# Patient Record
Sex: Female | Born: 1955 | Race: White | Hispanic: No | Marital: Married | Smoking: Current every day smoker
Health system: Southern US, Community
[De-identification: ages and names within clinical notes are randomized; demographics above are authoritative.]

## PROBLEM LIST (undated history)

## (undated) DIAGNOSIS — J449 Chronic obstructive pulmonary disease, unspecified: Secondary | ICD-10-CM

## (undated) DIAGNOSIS — G8929 Other chronic pain: Secondary | ICD-10-CM

## (undated) DIAGNOSIS — Z950 Presence of cardiac pacemaker: Secondary | ICD-10-CM

## (undated) DIAGNOSIS — Z87891 Personal history of nicotine dependence: Secondary | ICD-10-CM

## (undated) DIAGNOSIS — I441 Atrioventricular block, second degree: Secondary | ICD-10-CM

## (undated) DIAGNOSIS — Z720 Tobacco use: Secondary | ICD-10-CM

## (undated) DIAGNOSIS — IMO0002 Reserved for concepts with insufficient information to code with codable children: Secondary | ICD-10-CM

## (undated) DIAGNOSIS — G40209 Localization-related (focal) (partial) symptomatic epilepsy and epileptic syndromes with complex partial seizures, not intractable, without status epilepticus: Secondary | ICD-10-CM

## (undated) DIAGNOSIS — S069XAA Unspecified intracranial injury with loss of consciousness status unknown, initial encounter: Secondary | ICD-10-CM

## (undated) DIAGNOSIS — F319 Bipolar disorder, unspecified: Secondary | ICD-10-CM

## (undated) DIAGNOSIS — S069X9A Unspecified intracranial injury with loss of consciousness of unspecified duration, initial encounter: Secondary | ICD-10-CM

## (undated) DIAGNOSIS — I10 Essential (primary) hypertension: Secondary | ICD-10-CM

## (undated) DIAGNOSIS — R569 Unspecified convulsions: Secondary | ICD-10-CM

## (undated) DIAGNOSIS — T7491XA Unspecified adult maltreatment, confirmed, initial encounter: Secondary | ICD-10-CM

## (undated) DIAGNOSIS — B182 Chronic viral hepatitis C: Secondary | ICD-10-CM

## (undated) DIAGNOSIS — R300 Dysuria: Secondary | ICD-10-CM

## (undated) DIAGNOSIS — M25559 Pain in unspecified hip: Secondary | ICD-10-CM

## (undated) HISTORY — DX: Presence of cardiac pacemaker: Z95.0

## (undated) HISTORY — DX: Localization-related (focal) (partial) symptomatic epilepsy and epileptic syndromes with complex partial seizures, not intractable, without status epilepticus: G40.209

## (undated) HISTORY — DX: Chronic obstructive pulmonary disease, unspecified: J44.9

## (undated) HISTORY — DX: Tobacco use: Z72.0

## (undated) HISTORY — DX: Reserved for concepts with insufficient information to code with codable children: IMO0002

## (undated) HISTORY — DX: Unspecified adult maltreatment, confirmed, initial encounter: T74.91XA

## (undated) HISTORY — DX: Personal history of nicotine dependence: Z87.891

## (undated) HISTORY — DX: Unspecified intracranial injury with loss of consciousness status unknown, initial encounter: S06.9XAA

## (undated) HISTORY — PX: TUBAL LIGATION: SHX77

## (undated) HISTORY — DX: Pain in unspecified hip: M25.559

## (undated) HISTORY — DX: Unspecified intracranial injury with loss of consciousness of unspecified duration, initial encounter: S06.9X9A

## (undated) HISTORY — DX: Bipolar disorder, unspecified: F31.9

## (undated) HISTORY — DX: Dysuria: R30.0

## (undated) HISTORY — DX: Chronic viral hepatitis C: B18.2

## (undated) HISTORY — DX: Atrioventricular block, second degree: I44.1

## (undated) HISTORY — DX: Unspecified convulsions: R56.9

## (undated) HISTORY — DX: Essential (primary) hypertension: I10

## (undated) HISTORY — DX: Other chronic pain: G89.29

---

## 2009-07-27 HISTORY — PX: PACEMAKER PLACEMENT: SHX43

## 2011-04-02 DIAGNOSIS — F319 Bipolar disorder, unspecified: Secondary | ICD-10-CM | POA: Insufficient documentation

## 2011-04-02 DIAGNOSIS — I10 Essential (primary) hypertension: Secondary | ICD-10-CM | POA: Insufficient documentation

## 2011-05-28 DIAGNOSIS — IMO0002 Reserved for concepts with insufficient information to code with codable children: Secondary | ICD-10-CM

## 2011-05-28 HISTORY — DX: Reserved for concepts with insufficient information to code with codable children: IMO0002

## 2011-08-26 DIAGNOSIS — I441 Atrioventricular block, second degree: Secondary | ICD-10-CM | POA: Insufficient documentation

## 2011-08-29 DIAGNOSIS — IMO0002 Reserved for concepts with insufficient information to code with codable children: Secondary | ICD-10-CM | POA: Insufficient documentation

## 2011-11-18 DIAGNOSIS — G40209 Localization-related (focal) (partial) symptomatic epilepsy and epileptic syndromes with complex partial seizures, not intractable, without status epilepticus: Secondary | ICD-10-CM | POA: Insufficient documentation

## 2012-05-04 DIAGNOSIS — Z8619 Personal history of other infectious and parasitic diseases: Secondary | ICD-10-CM | POA: Insufficient documentation

## 2012-07-16 DIAGNOSIS — Z950 Presence of cardiac pacemaker: Secondary | ICD-10-CM

## 2012-07-16 HISTORY — DX: Presence of cardiac pacemaker: Z95.0

## 2013-07-31 HISTORY — PX: COLONOSCOPY: SHX174

## 2014-05-23 DIAGNOSIS — T7491XA Unspecified adult maltreatment, confirmed, initial encounter: Secondary | ICD-10-CM | POA: Insufficient documentation

## 2014-05-23 HISTORY — DX: Unspecified adult maltreatment, confirmed, initial encounter: T74.91XA

## 2014-06-24 DIAGNOSIS — R300 Dysuria: Secondary | ICD-10-CM | POA: Insufficient documentation

## 2015-09-01 ENCOUNTER — Ambulatory Visit: Payer: Self-pay | Admitting: Family Medicine

## 2015-09-17 ENCOUNTER — Encounter: Payer: Self-pay | Admitting: Family Medicine

## 2015-09-17 ENCOUNTER — Ambulatory Visit (INDEPENDENT_AMBULATORY_CARE_PROVIDER_SITE_OTHER): Payer: Medicaid Other | Admitting: Family Medicine

## 2015-09-17 VITALS — BP 167/75 | HR 90 | Temp 98.8°F | Ht 65.5 in | Wt 130.0 lb

## 2015-09-17 DIAGNOSIS — W1800XA Striking against unspecified object with subsequent fall, initial encounter: Secondary | ICD-10-CM | POA: Insufficient documentation

## 2015-09-17 DIAGNOSIS — R569 Unspecified convulsions: Secondary | ICD-10-CM

## 2015-09-17 DIAGNOSIS — J449 Chronic obstructive pulmonary disease, unspecified: Secondary | ICD-10-CM

## 2015-09-17 DIAGNOSIS — Z720 Tobacco use: Secondary | ICD-10-CM

## 2015-09-17 DIAGNOSIS — R269 Unspecified abnormalities of gait and mobility: Secondary | ICD-10-CM | POA: Diagnosis not present

## 2015-09-17 DIAGNOSIS — Z23 Encounter for immunization: Secondary | ICD-10-CM | POA: Diagnosis not present

## 2015-09-17 DIAGNOSIS — K739 Chronic hepatitis, unspecified: Secondary | ICD-10-CM

## 2015-09-17 DIAGNOSIS — Z87891 Personal history of nicotine dependence: Secondary | ICD-10-CM | POA: Insufficient documentation

## 2015-09-17 DIAGNOSIS — Z950 Presence of cardiac pacemaker: Secondary | ICD-10-CM

## 2015-09-17 DIAGNOSIS — G629 Polyneuropathy, unspecified: Secondary | ICD-10-CM | POA: Diagnosis not present

## 2015-09-17 DIAGNOSIS — W1809XS Striking against other object with subsequent fall, sequela: Secondary | ICD-10-CM

## 2015-09-17 DIAGNOSIS — I441 Atrioventricular block, second degree: Secondary | ICD-10-CM | POA: Diagnosis not present

## 2015-09-17 DIAGNOSIS — S069X9A Unspecified intracranial injury with loss of consciousness of unspecified duration, initial encounter: Secondary | ICD-10-CM | POA: Insufficient documentation

## 2015-09-17 DIAGNOSIS — S069X5S Unspecified intracranial injury with loss of consciousness greater than 24 hours with return to pre-existing conscious level, sequela: Secondary | ICD-10-CM

## 2015-09-17 DIAGNOSIS — F3170 Bipolar disorder, currently in remission, most recent episode unspecified: Secondary | ICD-10-CM

## 2015-09-17 DIAGNOSIS — R635 Abnormal weight gain: Secondary | ICD-10-CM

## 2015-09-17 DIAGNOSIS — S069XAA Unspecified intracranial injury with loss of consciousness status unknown, initial encounter: Secondary | ICD-10-CM | POA: Insufficient documentation

## 2015-09-17 DIAGNOSIS — I10 Essential (primary) hypertension: Secondary | ICD-10-CM

## 2015-09-17 DIAGNOSIS — Z87820 Personal history of traumatic brain injury: Secondary | ICD-10-CM | POA: Insufficient documentation

## 2015-09-17 DIAGNOSIS — Z5181 Encounter for therapeutic drug level monitoring: Secondary | ICD-10-CM

## 2015-09-17 HISTORY — DX: Personal history of nicotine dependence: Z87.891

## 2015-09-17 MED ORDER — AMLODIPINE BESYLATE 2.5 MG PO TABS: 2.5000 mg | ORAL_TABLET | Freq: Every day | ORAL | Status: DC
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
## 2015-09-17 NOTE — Patient Instructions (Addendum)
We'll talk about maybing starting the Chantix AFTER you see the psychiatrist and cardiologist Pick a quit date about a week after you start Check your old records or bottles or call your pharmacy and let me know the name and dose of the medicine We'll refer you to several specialist Start the 2.5 mg amlodipine once a day Your goal blood pressure is less than 150 mmHg on top. Try to follow the DASH guidelines (DASH stands for Dietary Approaches to Stop Hypertension) Try to limit the sodium in your diet.  Ideally, consume less than 1.5 grams (less than 1,500mg ) per day. Do not add salt when cooking or at the table.  Check the sodium amount on labels when shopping, and choose items lower in sodium when given a choice. Avoid or limit foods that already contain a lot of sodium. Eat a diet rich in fruits and vegetables and whole grains. You received the flu shot today; it should protect you against the flu virus over the coming months; it will take about two weeks for antibodies to develop; do try to stay away from hospitals, nursing homes, and daycares during peak flu season; taking extra vitamin C daily during flu season may help you avoid getting sick    Smoking Cessation, Tips for Success If you are ready to quit smoking, congratulations! You have chosen to help yourself be healthier. Cigarettes bring nicotine, tar, carbon monoxide, and other irritants into your body. Your lungs, heart, and blood vessels will be able to work better without these poisons. There are many different ways to quit smoking. Nicotine gum, nicotine patches, a nicotine inhaler, or nicotine nasal spray can help with physical craving. Hypnosis, support groups, and medicines help break the habit of smoking. WHAT THINGS CAN I DO TO MAKE QUITTING EASIER?  Here are some tips to help you quit for good:  Pick a date when you will quit smoking completely. Tell all of your friends and family about your plan to quit on that date.  Do  not try to slowly cut down on the number of cigarettes you are smoking. Pick a quit date and quit smoking completely starting on that day.  Throw away all cigarettes.   Clean and remove all ashtrays from your home, work, and car.  On a card, write down your reasons for quitting. Carry the card with you and read it when you get the urge to smoke.  Cleanse your body of nicotine. Drink enough water and fluids to keep your urine clear or pale yellow. Do this after quitting to flush the nicotine from your body.  Learn to predict your moods. Do not let a bad situation be your excuse to have a cigarette. Some situations in your life might tempt you into wanting a cigarette.  Never have "just one" cigarette. It leads to wanting another and another. Remind yourself of your decision to quit.  Change habits associated with smoking. If you smoked while driving or when feeling stressed, try other activities to replace smoking. Stand up when drinking your coffee. Brush your teeth after eating. Sit in a different chair when you read the paper. Avoid alcohol while trying to quit, and try to drink fewer caffeinated beverages. Alcohol and caffeine may urge you to smoke.  Avoid foods and drinks that can trigger a desire to smoke, such as sugary or spicy foods and alcohol.  Ask people who smoke not to smoke around you.  Have something planned to do right after eating or having  a cup of coffee. For example, plan to take a walk or exercise.  Try a relaxation exercise to calm you down and decrease your stress. Remember, you may be tense and nervous for the first 2 weeks after you quit, but this will pass.  Find new activities to keep your hands busy. Play with a pen, coin, or rubber band. Doodle or draw things on paper.  Brush your teeth right after eating. This will help cut down on the craving for the taste of tobacco after meals. You can also try mouthwash.   Use oral substitutes in place of cigarettes.  Try using lemon drops, carrots, cinnamon sticks, or chewing gum. Keep them handy so they are available when you have the urge to smoke.  When you have the urge to smoke, try deep breathing.  Designate your home as a nonsmoking area.  If you are a heavy smoker, ask your health care provider about a prescription for nicotine chewing gum. It can ease your withdrawal from nicotine.  Reward yourself. Set aside the cigarette money you save and buy yourself something nice.  Look for support from others. Join a support group or smoking cessation program. Ask someone at home or at work to help you with your plan to quit smoking.  Always ask yourself, "Do I need this cigarette or is this just a reflex?" Tell yourself, "Today, I choose not to smoke," or "I do not want to smoke." You are reminding yourself of your decision to quit.  Do not replace cigarette smoking with electronic cigarettes (commonly called e-cigarettes). The safety of e-cigarettes is unknown, and some may contain harmful chemicals.  If you relapse, do not give up! Plan ahead and think about what you will do the next time you get the urge to smoke. HOW WILL I FEEL WHEN I QUIT SMOKING? You may have symptoms of withdrawal because your body is used to nicotine (the addictive substance in cigarettes). You may crave cigarettes, be irritable, feel very hungry, cough often, get headaches, or have difficulty concentrating. The withdrawal symptoms are only temporary. They are strongest when you first quit but will go away within 10-14 days. When withdrawal symptoms occur, stay in control. Think about your reasons for quitting. Remind yourself that these are signs that your body is healing and getting used to being without cigarettes. Remember that withdrawal symptoms are easier to treat than the major diseases that smoking can cause.  Even after the withdrawal is over, expect periodic urges to smoke. However, these cravings are generally short lived  and will go away whether you smoke or not. Do not smoke! WHAT RESOURCES ARE AVAILABLE TO HELP ME QUIT SMOKING? Your health care provider can direct you to community resources or hospitals for support, which may include:  Group support.  Education.  Hypnosis.  Therapy.   This information is not intended to replace advice given to you by your health care provider. Make sure you discuss any questions you have with your health care provider.   Document Released: 06/10/2004 Document Revised: 10/03/2014 Document Reviewed: 02/28/2013 Elsevier Interactive Patient Education [employer].

## 2015-09-18 LAB — COMPREHENSIVE METABOLIC PANEL
ALT: 32 IU/L (ref 0–32)
AST: 29 IU/L (ref 0–40)
Albumin/Globulin Ratio: 1.2 (ref 1.1–2.5)
Albumin: 4.5 g/dL (ref 3.6–4.8)
Alkaline Phosphatase: 76 IU/L (ref 39–117)
BUN/Creatinine Ratio: 12 (ref 11–26)
BUN: 12 mg/dL (ref 8–27)
Bilirubin Total: 0.4 mg/dL (ref 0.0–1.2)
CO2: 20 mmol/L (ref 18–29)
Calcium: 9.7 mg/dL (ref 8.7–10.3)
Chloride: 103 mmol/L (ref 96–106)
Creatinine, Ser: 0.99 mg/dL (ref 0.57–1.00)
GFR calc Af Amer: 72 mL/min/{1.73_m2} (ref >59)
GFR calc non Af Amer: 62 mL/min/{1.73_m2} (ref >59)
Globulin, Total: 3.9 g/dL (ref 1.5–4.5)
Glucose: 93 mg/dL (ref 65–99)
Potassium: 4.8 mmol/L (ref 3.5–5.2)
Sodium: 136 mmol/L (ref 134–144)
Total Protein: 8.4 g/dL (ref 6.0–8.5)

## 2015-09-18 LAB — CBC WITH DIFFERENTIAL/PLATELET
Basophils Absolute: 0.1 10*3/uL (ref 0.0–0.2)
Basos: 1 %
EOS (ABSOLUTE): 0.1 10*3/uL (ref 0.0–0.4)
Eos: 1 %
Hematocrit: 47.1 % — ABNORMAL HIGH (ref 34.0–46.6)
Hemoglobin: 16.1 g/dL — ABNORMAL HIGH (ref 11.1–15.9)
Immature Grans (Abs): 0 10*3/uL (ref 0.0–0.1)
Immature Granulocytes: 0 %
Lymphocytes Absolute: 2.4 10*3/uL (ref 0.7–3.1)
Lymphs: 29 %
MCH: 31.2 pg (ref 26.6–33.0)
MCHC: 34.2 g/dL (ref 31.5–35.7)
MCV: 91 fL (ref 79–97)
Monocytes Absolute: 0.4 10*3/uL (ref 0.1–0.9)
Monocytes: 5 %
Neutrophils Absolute: 5.3 10*3/uL (ref 1.4–7.0)
Neutrophils: 64 %
Platelets: 167 10*3/uL (ref 150–379)
RBC: 5.16 x10E6/uL (ref 3.77–5.28)
RDW: 13.7 % (ref 12.3–15.4)
WBC: 8.2 10*3/uL (ref 3.4–10.8)

## 2015-09-18 LAB — LIPID PANEL W/O CHOL/HDL RATIO
Cholesterol, Total: 228 mg/dL — ABNORMAL HIGH (ref 100–199)
HDL: 74 mg/dL (ref >39)
LDL Calculated: 133 mg/dL — ABNORMAL HIGH (ref 0–99)
Triglycerides: 105 mg/dL (ref 0–149)
VLDL Cholesterol Cal: 21 mg/dL (ref 5–40)

## 2015-09-18 LAB — T3, FREE: T3, Free: 2.1 pg/mL (ref 2.0–4.4)

## 2015-09-18 LAB — T4, FREE: Free T4: 0.69 ng/dL — ABNORMAL LOW (ref 0.82–1.77)

## 2015-09-18 LAB — LITHIUM LEVEL: Lithium Lvl: 0.5 mmol/L — ABNORMAL LOW (ref 0.6–1.4)

## 2015-09-18 LAB — VITAMIN B12: Vitamin B-12: 772 pg/mL (ref 211–946)

## 2015-09-18 LAB — TSH: TSH: 0.744 u[IU]/mL (ref 0.450–4.500)

## 2015-09-21 ENCOUNTER — Encounter: Payer: Self-pay | Admitting: Family Medicine

## 2015-09-21 DIAGNOSIS — J449 Chronic obstructive pulmonary disease, unspecified: Secondary | ICD-10-CM | POA: Insufficient documentation

## 2015-09-21 NOTE — Assessment & Plan Note (Signed)
Patient requesting new providers locally; she is already established with [facility]

## 2015-09-21 NOTE — Assessment & Plan Note (Signed)
With falls; referring to neurologist; head CT from 2014 in [place] system reviewed; refer to physical therapy as well

## 2015-09-21 NOTE — Assessment & Plan Note (Signed)
Falls to either side, against walls, about once a week; reviewed head CT from 2014; referring back to neurologist, patient requests new providers here locally after moving from [facility]

## 2015-09-21 NOTE — Assessment & Plan Note (Signed)
I would love to help the patient quit smoking; however, with her bipolar I, we decided it best for her to see psychiatry before considering Chantix; also, would want clearance from cardiologist, since this is my first time seeing her and she has an extensive history; see AVS for other information shared with the patient regarding smoking cessation

## 2015-09-21 NOTE — Assessment & Plan Note (Signed)
Patient requesting new providers after moving from [place]; check LFTs today

## 2015-09-21 NOTE — Assessment & Plan Note (Signed)
Refer to [facility] psychiatrist, patient has moved from [place]; will check lithium level

## 2015-09-21 NOTE — Assessment & Plan Note (Signed)
I personally spoke with pharmacist to find out previous BP med name and dose; will start her back on this; close f/u; DASH guidelines; smoking cessation will help

## 2015-09-21 NOTE — Assessment & Plan Note (Signed)
Check thyroid function today. 

## 2015-09-21 NOTE — Assessment & Plan Note (Addendum)
Patient is requesting new providers here locally; neurology referral initiated; I did not have time today to review all old labs that she has done through the system, but perhaps neurologist can review records to ascertain whether 123456, folic acid, UPEP, SPEP, RPR, NCS/EMG, etc has been done for work-up; I am getting a TSH today

## 2015-09-21 NOTE — Assessment & Plan Note (Signed)
Patient in need of local neurologist after moving from [place]; referral initiated

## 2015-09-21 NOTE — Assessment & Plan Note (Signed)
Patient has moved form [place] and needs new providers locally; referring to pulmonologist at her request; smoking cessation strongly suggested

## 2015-09-21 NOTE — Assessment & Plan Note (Signed)
Documented in [place] records; refer to neurologist (patient is requesting new providers here locally)

## 2015-10-05 ENCOUNTER — Ambulatory Visit: Payer: Self-pay | Admitting: Physical Therapy

## 2015-10-09 ENCOUNTER — Ambulatory Visit: Payer: Medicaid Other | Admitting: Physical Therapy

## 2015-10-10 ENCOUNTER — Telehealth: Payer: Self-pay | Admitting: Family Medicine

## 2015-10-10 DIAGNOSIS — D582 Other hemoglobinopathies: Secondary | ICD-10-CM

## 2015-10-10 NOTE — Telephone Encounter (Signed)
I spoke with patient and her husband (both on speaker phone) Labs reviewed Limit saturated fats a little more H/H elevated; they go to see pulmonologist soon; I'll forward a message and ask if I order the testing for OSA, could he follow that Free T4 just a bit off; if gaining more weight, recheck in 6-8 weeks They agree ----------------------------- Cynthia [name]-- please check on psychiatry referral, they have not heard back yet

## 2015-10-12 NOTE — Telephone Encounter (Signed)
Cynthia [name], can you please check on this I really don't want to generate a 2nd referral

## 2015-10-12 NOTE — Telephone Encounter (Signed)
Dr. Sanda Klein, I am not sure what the status was of her referral was because any mental health referrals are "hidden" after they have been sent out. I spoke with the referral lady at [facility] and she asked that you just resubmit the referral to them and she will make sure to call her today or tomorrow with an appointment.

## 2015-10-15 ENCOUNTER — Ambulatory Visit (INDEPENDENT_AMBULATORY_CARE_PROVIDER_SITE_OTHER): Payer: Medicaid Other | Admitting: Family Medicine

## 2015-10-15 ENCOUNTER — Telehealth: Payer: Self-pay

## 2015-10-15 ENCOUNTER — Encounter: Payer: Self-pay | Admitting: Family Medicine

## 2015-10-15 VITALS — BP 213/95 | HR 76 | Temp 98.5°F | Ht 65.25 in | Wt 131.8 lb

## 2015-10-15 DIAGNOSIS — Z720 Tobacco use: Secondary | ICD-10-CM

## 2015-10-15 DIAGNOSIS — Z0000 Encounter for general adult medical examination without abnormal findings: Secondary | ICD-10-CM | POA: Diagnosis not present

## 2015-10-15 DIAGNOSIS — Z1239 Encounter for other screening for malignant neoplasm of breast: Secondary | ICD-10-CM

## 2015-10-15 DIAGNOSIS — Z114 Encounter for screening for human immunodeficiency virus [HIV]: Secondary | ICD-10-CM | POA: Diagnosis not present

## 2015-10-15 DIAGNOSIS — H9192 Unspecified hearing loss, left ear: Secondary | ICD-10-CM

## 2015-10-15 DIAGNOSIS — F3170 Bipolar disorder, currently in remission, most recent episode unspecified: Secondary | ICD-10-CM

## 2015-10-15 DIAGNOSIS — I10 Essential (primary) hypertension: Secondary | ICD-10-CM | POA: Diagnosis not present

## 2015-10-15 DIAGNOSIS — Z124 Encounter for screening for malignant neoplasm of cervix: Secondary | ICD-10-CM | POA: Diagnosis not present

## 2015-10-15 DIAGNOSIS — N63 Unspecified lump in breast: Secondary | ICD-10-CM | POA: Diagnosis not present

## 2015-10-15 DIAGNOSIS — F411 Generalized anxiety disorder: Secondary | ICD-10-CM | POA: Diagnosis not present

## 2015-10-15 DIAGNOSIS — Z780 Asymptomatic menopausal state: Secondary | ICD-10-CM | POA: Diagnosis not present

## 2015-10-15 DIAGNOSIS — N6311 Unspecified lump in the right breast, upper outer quadrant: Secondary | ICD-10-CM

## 2015-10-15 MED ORDER — VARENICLINE TARTRATE 0.5 MG X 11 & 1 MG X 42 PO MISC: ORAL | Status: DC

## 2015-10-15 NOTE — Telephone Encounter (Signed)
Contacted [facility] to follow up on referral appt.  Left message on machine and faxed referral request with OV notes. Phone: [phone] Fax: [phone]

## 2015-10-15 NOTE — Patient Instructions (Addendum)
Please do tell Dr. Clayborn Bigness about your chest symptoms Please do call to schedule your bone density scan; the number to schedule one at either [facility] or [facility] is [phone] I do recommend 1,000 iu of vitamin D3 daily Get 3 servings of calcium a day Your goal blood pressure is less than 150 mmHg on top. Try to follow the DASH guidelines (DASH stands for Dietary Approaches to Stop Hypertension) Try to limit the sodium in your diet.  Ideally, consume less than 1.5 grams (less than 1,583m) per day. Do not add salt when cooking or at the table.  Check the sodium amount on labels when shopping, and choose items lower in sodium when given a choice. Avoid or limit foods that already contain a lot of sodium. Eat a diet rich in fruits and vegetables and whole grains. Talk with your lung doctor about getting a sleep apnea test Return next week, maybe Monday, about the lorazepam   ---------------------------  Menopause is a normal process in which your reproductive ability comes to an end. This process happens gradually over a span of months to years, usually between the ages of 453and 523 Menopause is complete when you have missed 12 consecutive menstrual periods. It is important to talk with your health care provider about some of the most common conditions that affect postmenopausal women, such as heart disease, cancer, and bone loss (osteoporosis). Adopting a healthy lifestyle and getting preventive care can help to promote your health and wellness. Those actions can also lower your chances of developing some of these common conditions. WHAT SHOULD I KNOW ABOUT MENOPAUSE? During menopause, you may experience a number of symptoms, such as:  Moderate-to-severe hot flashes.  Night sweats.  Decrease in sex drive.  Mood swings.  Headaches.  Tiredness.  Irritability.  Memory problems.  Insomnia. Choosing to treat or not to treat menopausal changes is an  individual decision that you make with your health care provider. WHAT SHOULD I KNOW ABOUT HORMONE REPLACEMENT THERAPY AND SUPPLEMENTS? Hormone therapy products are effective for treating symptoms that are associated with menopause, such as hot flashes and night sweats. Hormone replacement carries certain risks, especially as you become older. If you are thinking about using estrogen or estrogen with progestin treatments, discuss the benefits and risks with your health care provider. WHAT SHOULD I KNOW ABOUT HEART DISEASE AND STROKE? Heart disease, heart attack, and stroke become more likely as you age. This may be due, in part, to the hormonal changes that your body experiences during menopause. These can affect how your body processes dietary fats, triglycerides, and cholesterol. Heart attack and stroke are both medical emergencies. There are many things that you can do to help prevent heart disease and stroke:  Have your blood pressure checked at least every 1-2 years. High blood pressure causes heart disease and increases the risk of stroke.  If you are 52674years old, ask your health care provider if you should take aspirin to prevent a heart attack or a stroke.  Do not use any tobacco products, including cigarettes, chewing tobacco, or electronic cigarettes. If you need help quitting, ask your health care provider.  It is important to eat a healthy diet and maintain a healthy weight.  Be sure to include plenty of vegetables, fruits, low-fat dairy products, and lean protein.  Avoid eating foods that are high in solid fats, added sugars, or salt (sodium).  Get regular exercise. This is one of the most important  things that you can do for your health.  Try to exercise for at least 150 minutes each week. The type of exercise that you do should increase your heart rate and make you sweat. This is known as moderate-intensity exercise.  Try to do strengthening exercises at least twice each  week. Do these in addition to the moderate-intensity exercise.  Know your numbers.Ask your health care provider to check your cholesterol and your blood glucose. Continue to have your blood tested as directed by your health care provider. WHAT SHOULD I KNOW ABOUT CANCER SCREENING? There are several types of cancer. Take the following steps to reduce your risk and to catch any cancer development as early as possible. Breast Cancer  Practice breast self-awareness.  This means understanding how your breasts normally appear and feel.  It also means doing regular breast self-exams. Let your health care provider know about any changes, no matter how small.  If you are 52 or older, have a clinician do a breast exam (clinical breast exam or CBE) every year. Depending on your age, family history, and medical history, it may be recommended that you also have a yearly breast X-ray (mammogram).  If you have a family history of breast cancer, talk with your health care provider about genetic screening.  If you are at high risk for breast cancer, talk with your health care provider about having an MRI and a mammogram every year.  Breast cancer (BRCA) gene test is recommended for women who have family members with BRCA-related cancers. Results of the assessment will determine the need for genetic counseling and BRCA1 and for BRCA2 testing. BRCA-related cancers include these types:  Breast. This occurs in males or females.  Ovarian.  Tubal. This may also be called fallopian tube cancer.  Cancer of the abdominal or pelvic lining (peritoneal cancer).  Prostate.  Pancreatic. Cervical, Uterine, and Ovarian Cancer Your health care provider may recommend that you be screened regularly for cancer of the pelvic organs. These include your ovaries, uterus, and vagina. This screening involves a pelvic exam, which includes checking for microscopic changes to the surface of your cervix (Pap test).  For women  ages 21-65, health care providers may recommend a pelvic exam and a Pap test every three years. For women ages 71-65, they may recommend the Pap test and pelvic exam, combined with testing for human papilloma virus (HPV), every five years. Some types of HPV increase your risk of cervical cancer. Testing for HPV may also be done on women of any age who have unclear Pap test results.  Other health care providers may not recommend any screening for nonpregnant women who are considered low risk for pelvic cancer and have no symptoms. Ask your health care provider if a screening pelvic exam is right for you.  If you have had past treatment for cervical cancer or a condition that could lead to cancer, you need Pap tests and screening for cancer for at least 20 years after your treatment. If Pap tests have been discontinued for you, your risk factors (such as having a new sexual partner) need to be reassessed to determine if you should start having screenings again. Some women have medical problems that increase the chance of getting cervical cancer. In these cases, your health care provider may recommend that you have screening and Pap tests more often.  If you have a family history of uterine cancer or ovarian cancer, talk with your health care provider about genetic screening.  If  you have vaginal bleeding after reaching menopause, tell your health care provider.  There are currently no reliable tests available to screen for ovarian cancer. Lung Cancer Lung cancer screening is recommended for adults 35-21 years old who are at high risk for lung cancer because of a history of smoking. A yearly low-dose CT scan of the lungs is recommended if you:  Currently smoke.  Have a history of at least 30 pack-years of smoking and you currently smoke or have quit within the past 15 years. A pack-year is smoking an average of one pack of cigarettes per day for one year. Yearly screening should:  Continue until it  has been 15 years since you quit.  Stop if you develop a health problem that would prevent you from having lung cancer treatment. Colorectal Cancer  This type of cancer can be detected and can often be prevented.  Routine colorectal cancer screening usually begins at age 10 and continues through age 38.  If you have risk factors for colon cancer, your health care provider may recommend that you be screened at an earlier age.  If you have a family history of colorectal cancer, talk with your health care provider about genetic screening.  Your health care provider may also recommend using home test kits to check for hidden blood in your stool.  A small camera at the end of a tube can be used to examine your colon directly (sigmoidoscopy or colonoscopy). This is done to check for the earliest forms of colorectal cancer.  Direct examination of the colon should be repeated every 5-10 years until age 64. However, if early forms of precancerous polyps or small growths are found or if you have a family history or genetic risk for colorectal cancer, you may need to be screened more often. Skin Cancer  Check your skin from head to toe regularly.  Monitor any moles. Be sure to tell your health care provider:  About any new moles or changes in moles, especially if there is a change in a mole's shape or color.  If you have a mole that is larger than the size of a pencil eraser.  If any of your family members has a history of skin cancer, especially at a young age, talk with your health care provider about genetic screening.  Always use sunscreen. Apply sunscreen liberally and repeatedly throughout the day.  Whenever you are outside, protect yourself by wearing long sleeves, pants, a wide-brimmed hat, and sunglasses. WHAT SHOULD I KNOW ABOUT OSTEOPOROSIS? Osteoporosis is a condition in which bone destruction happens more quickly than new bone creation. After menopause, you may be at an increased  risk for osteoporosis. To help prevent osteoporosis or the bone fractures that can happen because of osteoporosis, the following is recommended:  If you are 27-109 years old, get at least 1,000 mg of calcium and at least 600 mg of vitamin D per day.  If you are older than age 73 but younger than age 49, get at least 1,200 mg of calcium and at least 600 mg of vitamin D per day.  If you are older than age 40, get at least 1,200 mg of calcium and at least 800 mg of vitamin D per day. Smoking and excessive alcohol intake increase the risk of osteoporosis. Eat foods that are rich in calcium and vitamin D, and do weight-bearing exercises several times each week as directed by your health care provider. WHAT SHOULD I KNOW ABOUT HOW MENOPAUSE AFFECTS MY  MENTAL HEALTH? Depression may occur at any age, but it is more common as you become older. Common symptoms of depression include:  Low or sad mood.  Changes in sleep patterns.  Changes in appetite or eating patterns.  Feeling an overall lack of motivation or enjoyment of activities that you previously enjoyed.  Frequent crying spells. Talk with your health care provider if you think that you are experiencing depression. WHAT SHOULD I KNOW ABOUT IMMUNIZATIONS? It is important that you get and maintain your immunizations. These include:  Tetanus, diphtheria, and pertussis (Tdap) booster vaccine.  Influenza every year before the flu season begins.  Pneumonia vaccine.  Shingles vaccine. Your health care provider may also recommend other immunizations.   This information is not intended to replace advice given to you by your health care provider. Make sure you discuss any questions you have with your health care provider.   Document Released: 11/04/2005 Document Revised: 10/03/2014 Document Reviewed: 05/15/2014 Elsevier Interactive Patient Education [employer].
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
## 2015-10-15 NOTE — Assessment & Plan Note (Addendum)
Chest CT for lung cancer screening; encouraged her to quit smoking; I am here to help if desired

## 2015-10-16 ENCOUNTER — Telehealth: Payer: Self-pay | Admitting: Family Medicine

## 2015-10-16 LAB — HIV ANTIBODY (ROUTINE TESTING W REFLEX): HIV Screen 4th Generation wRfx: NONREACTIVE

## 2015-10-16 MED ORDER — AMLODIPINE BESYLATE 10 MG PO TABS: 10.0000 mg | ORAL_TABLET | Freq: Every day | ORAL | Status: DC

## 2015-10-16 NOTE — Telephone Encounter (Signed)
Dr. Sanda Klein let her know and gave her directions for use of medication.

## 2015-10-16 NOTE — Telephone Encounter (Signed)
Patient husband called and was returning  Dr. Sanda Klein call regarding her blood pressure.

## 2015-10-19 ENCOUNTER — Ambulatory Visit: Payer: Medicaid Other | Admitting: Family Medicine

## 2015-10-19 DIAGNOSIS — Z0000 Encounter for general adult medical examination without abnormal findings: Secondary | ICD-10-CM | POA: Insufficient documentation

## 2015-10-19 DIAGNOSIS — F411 Generalized anxiety disorder: Secondary | ICD-10-CM | POA: Insufficient documentation

## 2015-10-19 NOTE — Assessment & Plan Note (Signed)
Refer to ENT

## 2015-10-19 NOTE — Assessment & Plan Note (Signed)
I put in referral previously for psychiatrist, but patient has not heard anything; I talked with staff today to make sure that referral was underway and that someone would contact her; I think her significant anxiety today contributed to her BP elevation; she's coming back in 3-5 days to discuss mental health issues and address until we can get her in to psych

## 2015-10-19 NOTE — Assessment & Plan Note (Signed)
USPSTF grade A and B recommendations reviewed with patient; age-appropriate recommendations, preventive care, screening tests, etc discussed and encouraged; healthy living encouraged; see AVS for patient education given to patient  

## 2015-10-19 NOTE — Assessment & Plan Note (Signed)
Referral to psych generated at previous visit, but patient has not heard back; staff will work on this today; I did not start her today on benzos, but will bring her back asap to address and discuss going on something until she can get in to see psych

## 2015-10-19 NOTE — Assessment & Plan Note (Signed)
Offered; drawn today
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
## 2015-10-20 ENCOUNTER — Telehealth: Payer: Self-pay | Admitting: *Deleted

## 2015-10-20 NOTE — Telephone Encounter (Signed)
Received referral for low dose lung cancer screening CT scan from [facility] . Voicemail left at phone number listed in EMR for patient to call me back to facilitate scheduling scan.

## 2015-10-22 ENCOUNTER — Ambulatory Visit: Payer: Medicaid Other | Admitting: Gastroenterology

## 2015-10-23 ENCOUNTER — Encounter: Payer: Self-pay | Admitting: Pulmonary Disease

## 2015-10-23 ENCOUNTER — Ambulatory Visit (INDEPENDENT_AMBULATORY_CARE_PROVIDER_SITE_OTHER): Payer: Medicaid Other | Admitting: Pulmonary Disease

## 2015-10-23 VITALS — BP 138/68 | HR 66 | Ht 66.0 in | Wt 133.0 lb

## 2015-10-23 DIAGNOSIS — J449 Chronic obstructive pulmonary disease, unspecified: Secondary | ICD-10-CM

## 2015-10-23 DIAGNOSIS — J4550 Severe persistent asthma, uncomplicated: Secondary | ICD-10-CM

## 2015-10-23 DIAGNOSIS — Z720 Tobacco use: Secondary | ICD-10-CM | POA: Diagnosis not present

## 2015-10-23 DIAGNOSIS — F17200 Nicotine dependence, unspecified, uncomplicated: Secondary | ICD-10-CM

## 2015-10-23 MED ORDER — ALBUTEROL SULFATE HFA 108 (90 BASE) MCG/ACT IN AERS: 2.0000 | INHALATION_SPRAY | Freq: Four times a day (QID) | RESPIRATORY_TRACT | Status: DC | PRN

## 2015-10-23 MED ORDER — FLUTICASONE FUROATE-VILANTEROL 100-25 MCG/INH IN AEPB: 1.0000 | INHALATION_SPRAY | Freq: Every day | RESPIRATORY_TRACT | Status: DC

## 2015-10-23 MED ORDER — FLUTICASONE FUROATE-VILANTEROL 100-25 MCG/INH IN AEPB: 1.0000 | INHALATION_SPRAY | Freq: Every day | RESPIRATORY_TRACT | Status: AC

## 2015-10-23 NOTE — Telephone Encounter (Signed)
LMOM to find out if patient has received a call/appt for at [facility]. No answer at [facility] when attempting to check patients appt status.

## 2015-10-25 ENCOUNTER — Telehealth: Payer: Self-pay | Admitting: Family Medicine

## 2015-10-25 DIAGNOSIS — IMO0002 Reserved for concepts with insufficient information to code with codable children: Secondary | ICD-10-CM | POA: Insufficient documentation

## 2015-10-25 DIAGNOSIS — R87612 Low grade squamous intraepithelial lesion on cytologic smear of cervix (LGSIL): Secondary | ICD-10-CM | POA: Insufficient documentation

## 2015-10-25 LAB — IGP, APTIMA HPV, RFX 16/18,45
HPV Aptima: POSITIVE — AB
PAP Smear Comment: 0

## 2015-10-25 NOTE — Telephone Encounter (Signed)
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

## 2015-10-25 NOTE — Telephone Encounter (Signed)
I left brief message; do NOT be alarmed that I'm calling on a Sunday; my chance to catch up; will try her tomorrow

## 2015-10-26 ENCOUNTER — Telehealth: Payer: Self-pay | Admitting: Family Medicine

## 2015-10-26 ENCOUNTER — Encounter: Payer: Self-pay | Admitting: Family Medicine

## 2015-10-26 ENCOUNTER — Ambulatory Visit (INDEPENDENT_AMBULATORY_CARE_PROVIDER_SITE_OTHER): Payer: Medicaid Other | Admitting: Family Medicine

## 2015-10-26 ENCOUNTER — Ambulatory Visit: Payer: Medicaid Other | Attending: Family Medicine | Admitting: Physical Therapy

## 2015-10-26 ENCOUNTER — Encounter: Payer: Self-pay | Admitting: Physical Therapy

## 2015-10-26 VITALS — BP 146/72 | HR 76 | Temp 98.4°F | Ht 65.25 in | Wt 134.0 lb

## 2015-10-26 DIAGNOSIS — B977 Papillomavirus as the cause of diseases classified elsewhere: Secondary | ICD-10-CM

## 2015-10-26 DIAGNOSIS — IMO0002 Reserved for concepts with insufficient information to code with codable children: Secondary | ICD-10-CM

## 2015-10-26 DIAGNOSIS — R2689 Other abnormalities of gait and mobility: Secondary | ICD-10-CM

## 2015-10-26 DIAGNOSIS — F411 Generalized anxiety disorder: Secondary | ICD-10-CM

## 2015-10-26 DIAGNOSIS — I10 Essential (primary) hypertension: Secondary | ICD-10-CM

## 2015-10-26 DIAGNOSIS — R269 Unspecified abnormalities of gait and mobility: Secondary | ICD-10-CM | POA: Diagnosis present

## 2015-10-26 DIAGNOSIS — R8789 Other abnormal findings in specimens from female genital organs: Secondary | ICD-10-CM

## 2015-10-26 DIAGNOSIS — J449 Chronic obstructive pulmonary disease, unspecified: Secondary | ICD-10-CM | POA: Diagnosis not present

## 2015-10-26 DIAGNOSIS — R531 Weakness: Secondary | ICD-10-CM | POA: Insufficient documentation

## 2015-10-26 DIAGNOSIS — F3170 Bipolar disorder, currently in remission, most recent episode unspecified: Secondary | ICD-10-CM

## 2015-10-26 DIAGNOSIS — R87612 Low grade squamous intraepithelial lesion on cytologic smear of cervix (LGSIL): Secondary | ICD-10-CM

## 2015-10-26 DIAGNOSIS — Z79899 Other long term (current) drug therapy: Secondary | ICD-10-CM | POA: Diagnosis not present

## 2015-10-26 MED ORDER — LORAZEPAM 0.5 MG PO TABS: 0.5000 mg | ORAL_TABLET | Freq: Two times a day (BID) | ORAL | Status: DC | PRN

## 2015-10-26 NOTE — Telephone Encounter (Signed)
I spoke with patient and her husband; lung doctor visit was upsetting; her lungs are in bad shape; she needs to stop smoking, she was told she would not live to be 60 years old if she didn't quit smoking She will come in today at 1:15 pm for BP and anxiety and we'll go over pap smear ------------------- [facility] staff: please put patient on the schedule for 1:15 pm today; she is aware

## 2015-10-26 NOTE — Assessment & Plan Note (Addendum)
Controlled today; continue amlodipine 10 mg daily; try to practice stress reduction; DASH guidelines
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
## 2015-10-26 NOTE — Patient Instructions (Addendum)
Please only use the new medicine for stormy moments Never ever mix with pain pills, sleeping pills, other anxiety pills except your current prescriptions, or alcohol Continue your other medicine Continue the 10 mg amlodipine for blood pressure If you have not heard about the psychiatrist appointment by Friday, please call us here at the office, ask for Keri  Human Papillomavirus Human papillomavirus (HPV) is the most common sexually transmitted infection (STI) and is highly contagious. HPV infections cause genital warts and cancers to the outlet of the womb (cervix), birth canal (vagina), opening of the birth canal (vulva), and anus. There are over 100 types of HPV. Unless wartlike lesions are present in the throat or there are genital warts that you can see or feel, HPV usually does not cause symptoms. It is possible to be infected for long periods and pass it on to others without knowing it. CAUSES  HPV is spread from person to person through sexual contact. This includes oral, vaginal, or anal sex. RISK FACTORS  Having unprotected sex. HPV can be spread by oral, vaginal, or anal sex.  Having several sex partners.  Having a sex partner who has other sex partners.  Having or having had another sexually transmitted infection. SIGNS AND SYMPTOMS  Most people carrying HPV do not have any symptoms. If symptoms are present, symptoms may include:  Wartlike lesions in the throat (from having oral sex).  Warts in the infected skin or mucous membranes.  Genital warts that may itch, burn, or bleed.  Genital warts that may be painful or bleed during sexual intercourse. DIAGNOSIS  If wartlike lesions are present in the throat or genital warts are present, your health care provider can usually diagnose HPV by physical examination.   Genital warts are easily seen with the naked eye.  Currently, there is no FDA-approved test to detect HPV in males.  In females, a Pap test can show cells that  are infected with HPV.  In females, a scope can be used to view the cervix (colposcopy). A colposcopy can be performed if the pelvic exam or Pap test is abnormal. A sample of tissue may be removed (biopsy) during the colposcopy. TREATMENT  There is no treatment for the virus itself. However, there are treatments for the health problems and symptoms HPV can cause. Your health care provider will follow you closely after you are treated. This is because the HPV can come back and may need treatment again. Treatment of HPV may include:   Medicines, which may be injected or applied in a cream, lotion, or gel form.  Use of a probe to apply extreme cold (cryotherapy).  Application of an intense beam of light (laser treatment).  Use of a probe to apply extreme heat (electrocautery).  Surgery. HOME CARE INSTRUCTIONS   Take medicines only as directed by your health care provider.  Use over-the-counter creams for itching or irritation as directed by your health care provider.  Keep all follow-up visits as directed by your health care provider. This is important.  Do not touch or scratch the warts.  Do not treat genital warts with medicines used for treating hand warts.  Do not have sex while you are being treated.  Do not douche or use tampons during treatment of HPV.  Tell your sex partner about your infection because he or she may also need treatment.  If you become pregnant, tell your health care provider that you have had HPV. Your health care provider will monitor you closely  during pregnancy to be sure your baby is safe.  After treatment, use condoms during sex to prevent future infections.  Have only one sex partner.  Have a sex partner who does not have other sex partners. PREVENTION   Talk to your health care provider about getting the HPV vaccines. These vaccines prevent some HPV infections and cancers. It is recommended that the vaccine be given to males and females between  the ages of 60 and 42 years old. It will not work if you already have HPV, and it is not recommended for pregnant women.  A Pap test is done to screen for cervical cancer in women.  The first Pap test should be done at age 87 years.  Between ages 76 and 84 years, Pap tests are repeated every 2 years.  Beginning at age 29, you are advised to have a Pap test every 3 years as long as your past 3 Pap tests have been normal.  Some women have medical problems that increase the chance of getting cervical cancer. Talk to your health care provider about these problems. It is especially important to talk to your health care provider if a new problem develops soon after your last Pap test. In these cases, your health care provider may recommend more frequent screening and Pap tests.  The above recommendations are the same for women who have or have not gotten the vaccine for HPV.  If you had a hysterectomy for a problem that was not a cancer or a condition that could lead to cancer, then you no longer need Pap tests. However, even if you no longer need a Pap test, a regular exam is a good idea to make sure no other problems are starting.   If you are between the ages of 50 and 62 years and you have had normal Pap tests going back 10 years, you no longer need Pap tests. However, even if you no longer need a Pap test, a regular exam is a good idea to make sure no other problems are starting.  If you have had past treatment for cervical cancer or a condition that could lead to cancer, you need Pap tests and screening for cancer for at least 20 years after your treatment.  If Pap tests have been discontinued, risk factors (such as a new sexual partner)need to be reassessed to determine if screening should be resumed.  Some women may need screenings more often if they are at high risk for cervical cancer. SEEK MEDICAL CARE IF:   The treated skin becomes red, swollen, or painful.  You have a  fever.  You feel generally ill.  You feel lumps or pimple-like projections in and around your genital area.  You develop bleeding of the vagina or the treatment area.  You have painful sexual intercourse. MAKE SURE YOU:   Understand these instructions.  Will watch your condition.  Will get help if you are not doing well or get worse.   This information is not intended to replace advice given to you by your health care provider. Make sure you discuss any questions you have with your health care provider.   Document Released: 12/03/2003 Document Revised: 10/03/2014 Document Reviewed: 12/18/2013 Elsevier Interactive Patient Education [employer].

## 2015-10-26 NOTE — Assessment & Plan Note (Signed)
Awaiting psychiatry referral; will put her back on very low dose benzo to be used JUST for stormy moments; I saw what happened to her BP at the last appt and realize anxiety can run her pressures up; controlled substance contract signed

## 2015-10-26 NOTE — Progress Notes (Signed)
BP 146/72 mmHg  Pulse 76  Temp(Src) 98.4 F (36.9 C)  Ht 5' 5.25" (1.657 m)  Wt 134 lb (60.782 kg)  BMI 22.14 kg/m2  SpO2 97%   Subjective:    Patient ID: Cynthia [name], female    DOB: 23-Jan-1956, 60 y.o.   MRN: [id number]  HPI: Cynthia [name] is a 60 y.o. female  Chief Complaint  Patient presents with  . Hypertension    recheck, increased BP med  . Anxiety    possibly start another med  . Results    pap results   She was upset at the last visit and that was made the BP go up she thinks; now on 10 mg amlodipine daily; no extra salt; taking blood pressure medicine; using salt substitute  She has long hx of anxiety; she has been on anxiety medicine in the past for years; has been upset by some things lately; financial issues, dsability, lung doctor told her she would not live to be 69 if she didn't quit smoking; previous dose of benzowas 0.5 mg; previously seen psychiatrist but moved and we're still waiting on the referral to be processed; she gets so anxious that she gets fidgety and worried; worries about lots of things; sometimes her BP, sometimes insurance, sometimes a lot of things; she does not take any controlled substances at this time  Abnormal pap smear; education and referral given today; explained that she had HPV positive and LGSIL  Relevant past medical, surgical, social history reviewed and updated as indicated. Interim medical history since our last visit reviewed. Allergies and medications reviewed and updated.  Review of Systems  Per HPI unless specifically indicated above     Objective:    BP 146/72 mmHg  Pulse 76  Temp(Src) 98.4 F (36.9 C)  Ht 5' 5.25" (1.657 m)  Wt 134 lb (60.782 kg)  BMI 22.14 kg/m2  SpO2 97%  Wt Readings from Last 3 Encounters:  11/06/15 135 lb (61.236 kg)  11/03/15 135 lb 12.8 oz (61.598 kg)  11/02/15 135 lb (61.236 kg)    Physical Exam  Constitutional: She appears well-developed and well-nourished.  HENT:   Mouth/Throat: Mucous membranes are normal. She has dentures.  Eyes: EOM are normal. No scleral icterus.  Cardiovascular: Normal rate, regular rhythm, S1 normal, S2 normal and normal heart sounds.   No extrasystoles are present.  Pulmonary/Chest: Effort normal and breath sounds normal. No respiratory distress.  Abdominal: Normal appearance.  Musculoskeletal: Normal range of motion. She exhibits no edema.  Neurological: She is alert. She displays no tremor.  Skin: Skin is warm and dry. No bruising and no ecchymosis noted. No cyanosis. No pallor.  Psychiatric: Her speech is normal and behavior is normal. Thought content normal. Her mood appears anxious. Her affect is not blunt, not labile and not inappropriate. Cognition and memory are impaired. She does not exhibit a depressed mood. She exhibits abnormal recent memory.   Results for orders placed or performed in visit on 10/15/15  HIV antibody  Result Value Ref Range   HIV Screen 4th Generation wRfx Non Reactive Non Reactive  IGP, Aptima HPV, rfx 16/18,45  Result Value Ref Range   DIAGNOSIS: Comment (A)    Specimen adequacy: Comment    CLINICIAN PROVIDED ICD10: Comment    Performed by: Comment    Electronically signed by: Comment    PAP SMEAR COMMENT .    PATHOLOGIST PROVIDED ICD10: Comment    Note: Comment    Test Methodology Comment  HPV Aptima Positive (A) Negative      Assessment & Plan:   Problem List Items Addressed This Visit      Cardiovascular and Mediastinum   Essential hypertension, benign    Controlled today; continue amlodipine 10 mg daily; try to practice stress reduction; DASH guidelines        Respiratory   COPD (chronic obstructive pulmonary disease) ([place])    Seeing pulmonologist, anxiety about health related to her lungs; I am willing to help her qui smoking if/when ready        Other   Bipolar affective disorder [facility])    Awaiting psychiatry referral to take place; asking staff to please check on this  again      Generalized anxiety disorder - Primary    Awaiting psychiatry referral; will put her back on very low dose benzo to be used JUST for stormy moments; I saw what happened to her BP at the last appt and realize anxiety can run her pressures up; controlled substance contract signed      Abnormal finding on Pap smear, HPV DNA positive    Explained diagnosis to patient; referred to GYN, expect colpo      Low grade squamous intraepithelial lesion (LGSIL) on Papanicolaou smear of cervix    Discussed dx with patient, referred to GYN; safe sex, condoms      Controlled substance agreement signed    Discussed risk of controlled substances including possible unintentional overdose, especially if mixed with alcohol or other pills; typical speech given including illegal to share, even out of the goodness of her heart, always keep in the original bottle, safeguard medicine, do NOT mix with alcohol, other pain pills, "nerve" or anxiety pills, or sleeping pills; I am not obligated to approve of early refill or give new prescription if medicine is lost, stolen, or destroyed even with a police report, etc.; patient agrees with plan; controlled substance contract signed         Follow up plan: No Follow-up on file.  An after-visit summary was printed and given to the patient at [place].  Please see the patient instructions which may contain other information and recommendations beyond what is mentioned above in the assessment and plan.  Face-to-face time with patient was more than 25 minutes, >50% time spent counseling and coordination of care

## 2015-10-26 NOTE — Telephone Encounter (Signed)
Confirmed she wants husband in contacts

## 2015-10-26 NOTE — Patient Instructions (Addendum)
SIT TO STAND: No Device   Sit with feet shoulder-width apart, on floor.(Make sure that you are in a chair that won't move like a chair against a wall or couch etc) Lean chest forward, raise hips up from surface. Straighten hips and knees. Weight bear equally on left and right sides. 10___ reps per set, _2__ sets per day, _5__ days per week Place left leg closer to sitting surface.  Copyright  VHI. All rights reserved.  Backward Walking   Walk backward, toes of each foot coming down first. Take long, even strides. Make sure you have a clear pathway with no obstructions when you do this. Stand beside counter and walk backward  And then walk forward doing opposite directions; repeat 10 laps 2x a day at least 5 days a week.  Copyright  VHI. All rights reserved.  Tandem Walking   Stand beside kitchen sink and place one foot in front of the other, lift your hand and try to hold position for 10 sec. Repeat with other foot in front; Repeat 5 reps with each foot in front 5 days a week.Balance: Unilateral   Attempt to balance on left leg, eyes open. Hold _5-10___ seconds.Start with holding onto counter and if you get your balance you can try to let go of counter. Repeat __5__ times per set. Do __1__ sets per session. Do __1__ sessions per day. Keep eyes open:   http://orth.exer.us/29   Copyright  VHI. All rights reserved.     Lateral Trunk / IT Band Stretch    Stand with ball to wall at hip level, other leg crossed in front of ball-side leg. Reach above head with arms and keep both feet on floor. Hold ___ seconds. Do ___ sets of ___ repetitions.  Copyright  VHI. All rights reserved.    Copyright  VHI. All rights reserved. Knee to Chest (Flexion)   Pull knee toward chest. Feel stretch in lower back or buttock area. Breathing deeply, Hold __15__ seconds. Repeat with other knee. Repeat _2-3___ times. Do _2-3___ sessions per day.  http://gt2.exer.us/225   Copyright  VHI. All  rights reserved.   Lower Trunk Rotation Stretch  Lying on back with knees bent, Keeping back flat and feet together, rotate knees side to side slowly and in pain free range of motion.  Hold _2___ seconds. Repeat for 1-2 minutes. Do __1__ sets per session. Do __2-3__ sessions per day.  http://orth.exer.us/122   Copyright  VHI. All rights reserved.   Bridge   Lie back, legs bent. Inhale, pressing hips up.  Exhale, rolling down along spine from top. Repeat _10___ times. Do __1-2__ sessions per day.  Copyright  VHI. All rights reserved.    WALKING  Walking is a great form of exercise to increase your strength, endurance and overall fitness.  A walking program can help you start slowly and gradually build endurance as you go.  Everyone's ability is different, so each person's starting point will be different.  You do not have to follow them exactly.  The are just samples. You should simply find out what's right for you and stick to that program.   In the beginning, you'll start off walking 2-3 times a day for short distances.  As you get stronger, you'll be walking further at just 1-2 times per day.  A. You Can Walk For A Certain Length Of Time Each Day    Walk 5 minutes 3 times per day.  Increase 2 minutes every 2 days (3 times per  day).  Work up to 25-30 minutes (1-2 times per day).   Example:   Day 1-2 5 minutes 3 times per day   Day 7-8 12 minutes 2-3 times per day   Day 13-14 25 minutes 1-2 times per day  B. You Can Walk For a Certain Distance Each Day     Distance can be substituted for time.    Example:   3 trips to mailbox (at road)   3 trips to corner of block   3 trips around the block  C. Go to local [employer] and use the track.    Walk for distance ____ around track  Or time ____ minutes  D. Walk ____ Jog ____ Run ___  Please only do the exercises that your therapist has initialed and dated

## 2015-10-26 NOTE — Telephone Encounter (Signed)
Pt added to today's schedule for 1:15pm. Thanks.

## 2015-10-27 ENCOUNTER — Encounter: Payer: Self-pay | Admitting: Physical Therapy
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
## 2015-10-30 ENCOUNTER — Ambulatory Visit: Payer: Medicaid Other | Admitting: Family Medicine

## 2015-11-02 ENCOUNTER — Encounter: Payer: Self-pay | Admitting: Family Medicine

## 2015-11-02 ENCOUNTER — Ambulatory Visit (INDEPENDENT_AMBULATORY_CARE_PROVIDER_SITE_OTHER): Payer: Medicaid Other | Admitting: Family Medicine

## 2015-11-02 ENCOUNTER — Telehealth: Payer: Self-pay | Admitting: *Deleted

## 2015-11-02 VITALS — BP 150/72 | HR 87 | Temp 98.5°F | Wt 135.0 lb

## 2015-11-02 DIAGNOSIS — F411 Generalized anxiety disorder: Secondary | ICD-10-CM | POA: Diagnosis not present

## 2015-11-02 DIAGNOSIS — I10 Essential (primary) hypertension: Secondary | ICD-10-CM

## 2015-11-02 NOTE — Telephone Encounter (Signed)
Cynthia [name], Patient was back in the office today; still has not heard anything; please help patient with this; thank you
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
## 2015-11-02 NOTE — Telephone Encounter (Signed)
Submitted PA for [employer] thru [facility]. Pt ID: [id number] T Ref# [id number]

## 2015-11-02 NOTE — Patient Instructions (Addendum)
GINA --> Patient has not heard about her psychiatry appointment yet; please ask Kristin Bruins or Alwyn Ren to help with this and make sure someone calls patient with an appointment Continue the current medicines

## 2015-11-03 ENCOUNTER — Ambulatory Visit (INDEPENDENT_AMBULATORY_CARE_PROVIDER_SITE_OTHER): Payer: Medicaid Other | Admitting: Gastroenterology

## 2015-11-03 ENCOUNTER — Other Ambulatory Visit: Payer: Self-pay

## 2015-11-03 ENCOUNTER — Other Ambulatory Visit
Admission: RE | Admit: 2015-11-03 | Discharge: 2015-11-03 | Disposition: A | Discharge status: Home / Self Care | Payer: Medicaid Other | Source: Ambulatory Visit | Attending: Gastroenterology | Admitting: Gastroenterology

## 2015-11-03 ENCOUNTER — Encounter: Payer: Self-pay | Admitting: Gastroenterology

## 2015-11-03 VITALS — BP 131/68 | HR 90 | Temp 97.7°F | Ht 65.0 in | Wt 135.8 lb

## 2015-11-03 DIAGNOSIS — B182 Chronic viral hepatitis C: Secondary | ICD-10-CM | POA: Diagnosis not present

## 2015-11-03 MED ORDER — LORAZEPAM 0.5 MG PO TABS: 0.5000 mg | ORAL_TABLET | Freq: Two times a day (BID) | ORAL | Status: DC | PRN

## 2015-11-03 NOTE — Telephone Encounter (Signed)
Putting limit, 15 pills to last 30 days at least
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
## 2015-11-03 NOTE — Telephone Encounter (Signed)
Routing to provider  

## 2015-11-05 ENCOUNTER — Other Ambulatory Visit: Payer: Self-pay | Admitting: Family Medicine

## 2015-11-05 ENCOUNTER — Encounter: Payer: Self-pay | Admitting: Family Medicine

## 2015-11-05 DIAGNOSIS — Z87891 Personal history of nicotine dependence: Secondary | ICD-10-CM

## 2015-11-05 HISTORY — DX: Personal history of nicotine dependence: Z87.891

## 2015-11-05 MED ORDER — FLUTICASONE-SALMETEROL 250-50 MCG/DOSE IN AEPB: 1.0000 | INHALATION_SPRAY | Freq: Two times a day (BID) | RESPIRATORY_TRACT | Status: DC

## 2015-11-05 NOTE — Telephone Encounter (Signed)
Pt informed of the medication change. Nothing further needed.

## 2015-11-05 NOTE — Telephone Encounter (Signed)
Memory Dance was denied by patient's insurance. Patient must try and fail at least 2 of the following. Advair, Dulera, Symbicort or Qvar. Please advise on what you would like for me to send for the patient.

## 2015-11-05 NOTE — Telephone Encounter (Signed)
Advair 250/50 ordered  Cynthia [name]

## 2015-11-06 ENCOUNTER — Encounter: Payer: Self-pay | Admitting: Family Medicine

## 2015-11-06 ENCOUNTER — Ambulatory Visit
Admission: RE | Admit: 2015-11-06 | Discharge: 2015-11-06 | Disposition: A | Discharge status: Home / Self Care | Payer: Medicaid Other | Source: Ambulatory Visit | Attending: Family Medicine | Admitting: Family Medicine

## 2015-11-06 ENCOUNTER — Inpatient Hospital Stay: Payer: Medicaid Other | Attending: Family Medicine | Admitting: Family Medicine

## 2015-11-06 DIAGNOSIS — J439 Emphysema, unspecified: Secondary | ICD-10-CM | POA: Diagnosis not present

## 2015-11-06 DIAGNOSIS — F17200 Nicotine dependence, unspecified, uncomplicated: Secondary | ICD-10-CM | POA: Diagnosis present

## 2015-11-06 DIAGNOSIS — Z122 Encounter for screening for malignant neoplasm of respiratory organs: Secondary | ICD-10-CM

## 2015-11-06 DIAGNOSIS — I2510 Atherosclerotic heart disease of native coronary artery without angina pectoris: Secondary | ICD-10-CM | POA: Insufficient documentation

## 2015-11-06 DIAGNOSIS — Z136 Encounter for screening for cardiovascular disorders: Secondary | ICD-10-CM | POA: Insufficient documentation

## 2015-11-06 DIAGNOSIS — Z87891 Personal history of nicotine dependence: Secondary | ICD-10-CM

## 2015-11-06 NOTE — Progress Notes (Signed)
In accordance with CMS guidelines, patient has meet eligibility criteria including age, absence of signs or symptoms of lung cancer, the specific calculation of cigarette smoking pack-years was 80 years and is a current smoker.   A shared decision-making session was conducted prior to the performance of CT scan. This includes one or more decision aids, includes benefits and harms of screening, follow-up diagnostic testing, over-diagnosis, false positive rate, and total radiation exposure.  Counseling on the importance of adherence to annual lung cancer LDCT screening, impact of co-morbidities, and ability or willingness to undergo diagnosis and treatment is imperative for compliance of the program.  Counseling on the importance of continued smoking cessation for former smokers; the importance of smoking cessation for current smokers and information about tobacco cessation interventions have been given to patient including the [place] at [facility], 1800 quit [place], as well as Cancer Center specific smoking cessation programs.  Written order for lung cancer screening with LDCT has been given to the patient and any and all questions have been answered to the best of my abilities.   Yearly follow up will be scheduled by Shawn Perkins, Thoracic Navigator.   

## 2015-11-08 DIAGNOSIS — Z79899 Other long term (current) drug therapy: Secondary | ICD-10-CM | POA: Insufficient documentation

## 2015-11-08 NOTE — Assessment & Plan Note (Signed)
Awaiting psychiatry referral to take place; asking staff to please check on this again

## 2015-11-08 NOTE — Assessment & Plan Note (Signed)
Discussed risk of controlled substances including possible unintentional overdose, especially if mixed with alcohol or other pills; typical speech given including illegal to share, even out of the goodness of her heart, always keep in the original bottle, safeguard medicine, do NOT mix with alcohol, other pain pills, "nerve" or anxiety pills, or sleeping pills; I am not obligated to approve of early refill or give new prescription if medicine is lost, stolen, or destroyed even with a police report, etc.; patient agrees with plan; controlled substance contract signed

## 2015-11-08 NOTE — Assessment & Plan Note (Signed)
Seeing pulmonologist, anxiety about health related to her lungs; I am willing to help her qui smoking if/when ready

## 2015-11-08 NOTE — Assessment & Plan Note (Signed)
Improved today; nearly at goal (want to get systolic pressure under Q000111Q mmHg); stress reduction, DASH guidelines, smoking cessation

## 2015-11-08 NOTE — Assessment & Plan Note (Signed)
Explained diagnosis to patient; referred to GYN, expect colpo

## 2015-11-08 NOTE — Assessment & Plan Note (Signed)
Still waiting on psychiatry referral that was placed September 21, 2015; see the after visit summary; I have asked my staff to please check into this because I really want to get the patient see by psychiatry for evaluation and management; she was seen previously in [place], but has moved here and we're wanting her to be seen by a local psychiatrist; benzo to be used sparingly, not every single day

## 2015-11-09 ENCOUNTER — Telehealth: Payer: Self-pay | Admitting: *Deleted

## 2015-11-09 NOTE — Telephone Encounter (Signed)
Notified patient of LDCT lung cancer screening results of Lung Rads 2S finding with recommendation for 12 month follow up imaging. Also notified of incidental findings noted below. Patient verbalizes understanding.   IMPRESSION: 1. Lung-RADS Category 2S, benign appearance or behavior. Continue annual screening with low-dose chest CT without contrast in 12 months. 2. The "S" modifier above refers to potentially clinically significant non lung cancer related findings. Specifically, there is extensive atherosclerosis, including left main and 3 vessel coronary artery disease. Please note that although the presence of coronary artery calcium documents the presence of coronary artery disease, the severity of this disease and any potential stenosis cannot be assessed on this non-gated CT examination. Assessment for potential risk factor modification, dietary therapy or pharmacologic therapy may be warranted, if clinically indicated. 3. Mild diffuse bronchial thickening with mild centrilobular and paraseptal emphysema; imaging findings suggestive of underlying COPD.

## 2015-11-10 ENCOUNTER — Telehealth: Payer: Self-pay | Admitting: *Deleted

## 2015-11-10 ENCOUNTER — Telehealth: Payer: Self-pay | Admitting: Family Medicine

## 2015-11-10 ENCOUNTER — Encounter: Payer: Self-pay | Admitting: Obstetrics and Gynecology

## 2015-11-10 DIAGNOSIS — I2510 Atherosclerotic heart disease of native coronary artery without angina pectoris: Secondary | ICD-10-CM | POA: Insufficient documentation

## 2015-11-10 DIAGNOSIS — R569 Unspecified convulsions: Secondary | ICD-10-CM | POA: Insufficient documentation

## 2015-11-10 DIAGNOSIS — Z720 Tobacco use: Secondary | ICD-10-CM | POA: Insufficient documentation

## 2015-11-10 NOTE — Telephone Encounter (Signed)
PA for Advair done thru [facility]. [id number] Approved until 11/08/2016. Pharmacy informed.

## 2015-11-10 NOTE — Telephone Encounter (Signed)
I called home number, left brief msg; scan showed some heart findings we want cardiologist to be aware of Cynthia [name] or Cynthia [name], Please contact the patient's cardiologist Let him/her know that her chest CT that indicates significant coronary artery disease, as him/her to review report Thank you  ----------------------copied from report------------------------ Specifically, there is extensive atherosclerosis, including left main and 3 vessel coronary artery disease. Please note that although the presence of coronary artery calcium documents the presence of coronary artery disease, the severity of this disease and any potential stenosis cannot be assessed on this non-gated CT examination. Assessment for potential risk factor modification, dietary therapy or pharmacologic therapy may be warranted, if clinically indicated.

## 2015-11-13 ENCOUNTER — Telehealth: Payer: Self-pay

## 2015-11-13 ENCOUNTER — Other Ambulatory Visit: Payer: Self-pay

## 2015-11-13 DIAGNOSIS — B182 Chronic viral hepatitis C: Secondary | ICD-10-CM

## 2015-11-13 NOTE — Telephone Encounter (Signed)
LVM for pt to return my call regarding results. Called central scheduling regarding [place] appt. Was told several messages have been left to schedule but no return call.

## 2015-11-13 NOTE — Telephone Encounter (Signed)
Pt returned my call and results were given. Also told pt central scheduling has been trying to contact him to schedule the [place]. He will call back to get this done.

## 2015-11-13 NOTE — Telephone Encounter (Signed)
-----   Message from Lucilla Lame, MD sent at 11/12/2015  1:19 PM EST ----- Let the patient know that she has genotype 1A and her tumor marker was negative for any sign of cancer. The patient should be treated for her hepatitis C when all of her labs are back

## 2015-11-17 NOTE — Telephone Encounter (Signed)
Dr. Etta Quill office at [facility] was notified. Staff said she was sending note to him now. They could review the images in care-everywhere.

## 2015-11-19 ENCOUNTER — Ambulatory Visit
Admission: RE | Admit: 2015-11-19 | Discharge: 2015-11-19 | Disposition: A | Discharge status: Home / Self Care | Payer: Medicaid Other | Source: Ambulatory Visit | Attending: Pulmonary Disease | Admitting: Pulmonary Disease

## 2015-11-19 ENCOUNTER — Ambulatory Visit (INDEPENDENT_AMBULATORY_CARE_PROVIDER_SITE_OTHER): Payer: Medicaid Other | Admitting: *Deleted

## 2015-11-19 DIAGNOSIS — J449 Chronic obstructive pulmonary disease, unspecified: Secondary | ICD-10-CM | POA: Diagnosis present

## 2015-11-19 DIAGNOSIS — J4550 Severe persistent asthma, uncomplicated: Secondary | ICD-10-CM

## 2015-11-19 DIAGNOSIS — Z720 Tobacco use: Secondary | ICD-10-CM | POA: Diagnosis present

## 2015-11-19 DIAGNOSIS — F17200 Nicotine dependence, unspecified, uncomplicated: Secondary | ICD-10-CM

## 2015-11-19 DIAGNOSIS — Z950 Presence of cardiac pacemaker: Secondary | ICD-10-CM | POA: Insufficient documentation

## 2015-11-19 LAB — PULMONARY FUNCTION TEST
DL/VA % pred: 65 %
DL/VA: 3.22 ml/min/mmHg/L
DLCO unc % pred: 60 %
DLCO unc: 15.36 ml/min/mmHg
FEF 25-75 Post: 1.29 L/sec
FEF 25-75 Pre: 1.09 L/sec
FEF2575-%Change-Post: 18 %
FEF2575-%Pred-Post: 60 %
FEF2575-%Pred-Pre: 51 %
FEV1-%Change-Post: 5 %
FEV1-%Pred-Post: 76 %
FEV1-%Pred-Pre: 72 %
FEV1-Post: 1.66 L
FEV1-Pre: 1.57 L
FEV1FVC-%Change-Post: 0 %
FEV1FVC-%Pred-Pre: 90 %
FEV6-%Change-Post: 7 %
FEV6-%Pred-Post: 87 %
FEV6-%Pred-Pre: 81 %
FEV6-Post: 2.33 L
FEV6-Pre: 2.17 L
FEV6FVC-%Pred-Post: 103 %
FEV6FVC-%Pred-Pre: 103 %
FVC-%Change-Post: 6 %
FVC-%Pred-Post: 84 %
FVC-%Pred-Pre: 79 %
FVC-Post: 2.33 L
FVC-Pre: 2.19 L
Post FEV1/FVC ratio: 71 %
Post FEV6/FVC ratio: 100 %
Pre FEV1/FVC ratio: 72 %
Pre FEV6/FVC Ratio: 100 %

## 2015-11-19 NOTE — Progress Notes (Signed)
PFT performed today with Nitrogen washout. 

## 2015-11-19 NOTE — Telephone Encounter (Signed)
Alwyn Ren, I'm just following up on this open note. Can you please check into this? If all taken care of, just document and sign off. Thank you!

## 2015-11-23 ENCOUNTER — Other Ambulatory Visit: Payer: Self-pay | Admitting: Family Medicine

## 2015-11-24 ENCOUNTER — Ambulatory Visit (INDEPENDENT_AMBULATORY_CARE_PROVIDER_SITE_OTHER): Payer: Medicaid Other | Admitting: Pulmonary Disease

## 2015-11-24 ENCOUNTER — Encounter: Payer: Self-pay | Admitting: Pulmonary Disease

## 2015-11-24 VITALS — BP 132/78 | HR 89 | Ht 65.0 in | Wt 133.0 lb

## 2015-11-24 DIAGNOSIS — Z720 Tobacco use: Secondary | ICD-10-CM | POA: Diagnosis not present

## 2015-11-24 DIAGNOSIS — R0600 Dyspnea, unspecified: Secondary | ICD-10-CM

## 2015-11-24 DIAGNOSIS — R079 Chest pain, unspecified: Secondary | ICD-10-CM | POA: Diagnosis not present

## 2015-11-24 DIAGNOSIS — J449 Chronic obstructive pulmonary disease, unspecified: Secondary | ICD-10-CM | POA: Diagnosis not present

## 2015-11-24 DIAGNOSIS — F17200 Nicotine dependence, unspecified, uncomplicated: Secondary | ICD-10-CM

## 2015-11-24 NOTE — Telephone Encounter (Signed)
OK to wait for ML for PRN medicine.

## 2015-11-24 NOTE — Patient Instructions (Signed)
Stop smoking as we discussed You can decide whether to resume the Chantix as previously prescribed Resume Breo inhaler as previously prescribed Continue albuterol inhaler as needed Baby aspirin - 81 mg daily I will call Dr Clayborn Bigness to discuss my concern about the finding of coronary disease on the CAT scan of your chest

## 2015-11-25 NOTE — Progress Notes (Signed)
PULMONARY OFFICE NOTE Primary MD: Enid Derry Date of initial consultation: 10/22/14  Problems: smoker, COPD, dyspnea, CAD Last IMPRESSION/PLAN (10/23/15):  COPD, Asthmatic bronchitis with wheezing, Smoker. PLAN: Counseled re: smoking cessation. Begin Breo 100/25 - one actuation daily. Cont PRN albuterol. ROV 3-4 wks with PFTs, CXR 11/19/15 PFT: mild obstruction, mild decrease in DLCO. Marginal improvement after BD challenge 11/19/15 NACPD  INTERVAL HISTORY: LDCT chest 11/06/15: no suspicious lung lesions but extensive coronary calcifications noted.   SUBJ: Tried Chantix. Didn't seem to be helping so she stopped after one week. Felt like she was benefited from [facility] but was concerned about cardiac risk of Breo + smoking so stopped Breo. Continues to smoke one PPD but continuing to try to quit. She has intermittent CP that is not reliably exertional and is sharp in quality. Since last visit, she has had   Filed Vitals:   11/24/15 1219  BP: 132/78  Pulse: 89  Height: 5\' 5"  (1.651 m)  Weight: 133 lb (60.328 kg)  SpO2: 96%    EXAM:  Gen: Thin, not cachectic, No overt respiratory distress HEENT: NCAT, TMs and canals normal, sclera white, nares and nasal mucosa normal, oropharynx normal Neck: Supple without LAN, thyromegaly, JVD Lungs: breath sounds diminished, percussion note normal, no wheezes Cardiovascular: Reg, no murmurs noted Abdomen: Soft, nontender, normal BS Ext: without clubbing, cyanosis, edema Neuro: grossly intact  DATA:   BMP Latest Ref Rng 09/17/2015  Glucose 65 - 99 mg/dL 93  BUN 8 - 27 mg/dL 12  Creatinine 0.57 - 1.00 mg/dL 0.99  BUN/Creat Ratio 11 - 26 12  Sodium 134 - 144 mmol/L 136  Potassium 3.5 - 5.2 mmol/L 4.8  Chloride 96 - 106 mmol/L 103  CO2 18 - 29 mmol/L 20  Calcium 8.7 - 10.3 mg/dL 9.7    CBC Latest Ref Rng 09/17/2015  WBC 3.4 - 10.8 x10E3/uL 8.2  Hematocrit 34.0 - 46.6 % 47.1(H)  Platelets 150 - 379 x10E3/uL 167    CXR:  NACPD  IMPRESSION:    Chronic obstructive pulmonary disease - mild by PFTs. Seems to be an asthmatic component as wheezing was heard initially and not heard presently Smoker Chest pain, atypical Dyspnea - seems out of proportion to PFTs Extensive coronary calcifications incidentally noted on LDCT chest  I am concerned that her dyspnea might be an anginal equivalent. She has seen Dr Clayborn Bigness recently  PLAN:  Counseled again re: smoking cessation. Recommended resumption of Chantix Resume Breo 100/25 - one actuation daily. I assured her that the cardiac risk profile is acceptable Cont PRN albuterol Recommended ASA 81 mg daily until further instruction by Dr Clayborn Bigness I have sent a letter to Dr Clayborn Bigness re: my concerns vis a vis the findings of coronary calcifications on LDCT of the chest.  ROV 4-6 wks   Cynthia Mcardle, MD [facility], Critical Care Medicine

## 2015-11-25 NOTE — Telephone Encounter (Signed)
Too early; may refill on or after March 9th; please re-request next week if still needed Also, please make sure she is going to be seeing psychiatrist Psych will be taking over this medicine

## 2015-11-26 NOTE — Telephone Encounter (Signed)
Pharmacy notified.

## 2015-11-27 ENCOUNTER — Telehealth: Payer: Self-pay

## 2015-11-27 NOTE — Telephone Encounter (Signed)
Patient notified that per Dr. Delight Ovens note, too early to fill. Will be able to be refilled on or after March 9th. She understood.

## 2015-11-27 NOTE — Telephone Encounter (Signed)
Pt's husband states that [facility] has contacted our office regarding Cynthia [name]'s refill for Lorazepam 0.5 tablets. He asked that someone please approve the refill as soon as possible and call to let them know when approved..  Thanks!

## 2015-12-02 NOTE — Telephone Encounter (Signed)
Patient has appt scheduled at [facility] for 01/14/16 @ 2pm.  Contacted patient and confirmed that she is aware of her appt.

## 2015-12-07 ENCOUNTER — Ambulatory Visit
Admission: RE | Admit: 2015-12-07 | Discharge: 2015-12-07 | Disposition: A | Discharge status: Home / Self Care | Payer: Medicaid Other | Source: Ambulatory Visit | Attending: Gastroenterology | Admitting: Gastroenterology

## 2015-12-07 ENCOUNTER — Ambulatory Visit: Admission: RE | Admit: 2015-12-07 | Payer: Medicaid Other | Source: Ambulatory Visit

## 2015-12-07 DIAGNOSIS — K8020 Calculus of gallbladder without cholecystitis without obstruction: Secondary | ICD-10-CM | POA: Diagnosis not present

## 2015-12-07 DIAGNOSIS — K7460 Unspecified cirrhosis of liver: Secondary | ICD-10-CM | POA: Insufficient documentation

## 2015-12-07 DIAGNOSIS — B182 Chronic viral hepatitis C: Secondary | ICD-10-CM

## 2015-12-07 DIAGNOSIS — K828 Other specified diseases of gallbladder: Secondary | ICD-10-CM | POA: Diagnosis not present

## 2015-12-08 ENCOUNTER — Telehealth: Payer: Self-pay

## 2015-12-08 NOTE — Telephone Encounter (Signed)
-----   Message from Lucilla Lame, MD sent at 12/07/2015  1:02 PM EDT ----- The patient know that the ultrasound showed her to have a liver consistent with cirrhosis. The elasticity scan also confirm the presence of cirrhosis. The patient's alpha-fetoprotein was normal. Her genotype was positive but there is no viral load that I can find on record. Please make sure the patient had a viral load sent off. This patient has a history of being treated in the past and if her viral load is positive she has not cleared the virus and may need further treatment.

## 2015-12-08 NOTE — Telephone Encounter (Signed)
LVM for pt to return my call.

## 2015-12-09 LAB — AFP TUMOR MARKER: AFP-Tumor Marker: 4.2 ng/mL (ref 0.0–8.3)

## 2015-12-09 LAB — HCV RT-PCR, QUANT (NON-GRAPH)
HCV Log 10: UNDETERMINED Log10copy/mL
IU log10: UNDETERMINED Log10 IU/mL

## 2015-12-09 LAB — HEPATITIS C GENOTYPE

## 2015-12-15 ENCOUNTER — Telehealth: Payer: Self-pay

## 2015-12-15 NOTE — Telephone Encounter (Signed)
-----   Message from Lucilla Lame, MD sent at 12/07/2015  1:02 PM EDT ----- Refer to the note on the ultrasound, the patient needs a follow-up alpha-fetoprotein and ultrasound in 6 months

## 2015-12-15 NOTE — Telephone Encounter (Signed)
Pt notified Dr. Allen Norris wants these repeated in 6 months. Added a message to the recall for myself to call to set up.

## 2015-12-15 NOTE — Telephone Encounter (Signed)
Called pt again and informed her of the lab results and [place] results. Pt will stop by [facility] office on Wednesday after 4 to sign the Medicaid Beneficiary readiness criteria form. Waiting on that in order to send off for hep C treatment.

## 2015-12-16 ENCOUNTER — Ambulatory Visit (INDEPENDENT_AMBULATORY_CARE_PROVIDER_SITE_OTHER): Payer: Medicaid Other | Admitting: Obstetrics and Gynecology

## 2015-12-16 ENCOUNTER — Encounter: Payer: Self-pay | Admitting: Obstetrics and Gynecology

## 2015-12-16 VITALS — BP 148/75 | HR 89 | Ht 65.0 in | Wt 134.3 lb

## 2015-12-16 DIAGNOSIS — R896 Abnormal cytological findings in specimens from other organs, systems and tissues: Secondary | ICD-10-CM | POA: Diagnosis not present

## 2015-12-16 DIAGNOSIS — IMO0002 Reserved for concepts with insufficient information to code with codable children: Secondary | ICD-10-CM

## 2015-12-16 DIAGNOSIS — Z202 Contact with and (suspected) exposure to infections with a predominantly sexual mode of transmission: Secondary | ICD-10-CM | POA: Diagnosis not present
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
## 2015-12-16 NOTE — Patient Instructions (Signed)
COLPOSCOPY POST-PROCEDURE INSTRUCTIONS  1. You may take Ibuprofen, Aleve or Tylenol for cramping if needed.  2. If Monsel's solution was used, you will have a black discharge.  3. Light bleeding is normal.  If bleeding is heavier than your period, please call.  4. Put nothing in your vagina until the bleeding or discharge stops (usually 2 or3 days).  5. We will call you within one week with biopsy results or discuss the results at your follow-up appointment if needed. 6.  

## 2015-12-18 LAB — RPR: RPR Ser Ql: NONREACTIVE

## 2015-12-18 LAB — HEPATITIS B SURFACE ANTIGEN: Hepatitis B Surface Ag: NEGATIVE

## 2015-12-18 LAB — HSV(HERPES SIMPLEX VRS) I + II AB-IGG
HSV 1 Glycoprotein G Ab, IgG: 30.6 index — ABNORMAL HIGH (ref 0.00–0.90)
HSV 2 Glycoprotein G Ab, IgG: 11.1 index — ABNORMAL HIGH (ref 0.00–0.90)

## 2015-12-18 LAB — HIV ANTIBODY (ROUTINE TESTING W REFLEX): HIV Screen 4th Generation wRfx: NONREACTIVE

## 2015-12-19 LAB — GC/CHLAMYDIA PROBE AMP
Chlamydia trachomatis, NAA: NEGATIVE
Neisseria gonorrhoeae by PCR: NEGATIVE

## 2015-12-21 LAB — PATHOLOGY

## 2015-12-23 ENCOUNTER — Telehealth: Payer: Self-pay | Admitting: Family Medicine

## 2015-12-23 NOTE — Telephone Encounter (Signed)
Please check on status of orders from January: Chest CT (low dose, should be through [facility], call him if needed) Diagnostic mammo and [place] Bone density (she can schedule) Thank you

## 2015-12-24 NOTE — Telephone Encounter (Signed)
There is 2 orders for the low dose Chest CT, one has been completed.

## 2015-12-25 ENCOUNTER — Encounter: Payer: Self-pay | Admitting: Obstetrics and Gynecology

## 2015-12-25 NOTE — Telephone Encounter (Signed)
Prior mammogram done at [facility], Oct. 2014

## 2015-12-25 NOTE — Telephone Encounter (Signed)
Left message for patient to call.

## 2015-12-25 NOTE — Telephone Encounter (Signed)
Per [place] Poli, patient must come by and sign release to get priors from [place] first. Then once they get the prior films in, they will call her to schedule.

## 2015-12-28 ENCOUNTER — Other Ambulatory Visit: Payer: Self-pay | Admitting: Family Medicine

## 2015-12-28 NOTE — Telephone Encounter (Signed)
[place] site reviewed; no other prescribers; no red flags; approved

## 2015-12-28 NOTE — Telephone Encounter (Signed)
Please just clarify with husband if patient is seeing psychiatrist yet; I put in that referral in December and was really hoping by now that psychiatrist would be managing the Seroquel I can refill if needed, but we'll certainly want to get her in to see psych soon if not done Also, I do not know what arvazepine 600 mg is; who prescribed it? What is it for?

## 2015-12-28 NOTE — Telephone Encounter (Signed)
Left message to call.

## 2015-12-28 NOTE — Telephone Encounter (Signed)
Emil (husband) requesting refill on Quetiapine 100 mg & 25mg  also requesting refill on Arvazepine 600mg . Please send all prescriptions to [facility]. Also patient requesting call when complete.

## 2015-12-30 NOTE — Telephone Encounter (Signed)
Called patient several times and I have left voicemail to call back and clarify weather she is seeing a psych yet or not also clarification on the other rx refill in question.

## 2015-12-31 ENCOUNTER — Ambulatory Visit: Payer: Self-pay | Admitting: Family Medicine

## 2016-01-05 ENCOUNTER — Ambulatory Visit (INDEPENDENT_AMBULATORY_CARE_PROVIDER_SITE_OTHER): Payer: Medicaid Other | Admitting: Obstetrics and Gynecology

## 2016-01-05 ENCOUNTER — Encounter: Payer: Self-pay | Admitting: Obstetrics and Gynecology

## 2016-01-05 VITALS — BP 142/78 | HR 86 | Ht 65.0 in | Wt 132.6 lb

## 2016-01-05 DIAGNOSIS — N870 Mild cervical dysplasia: Secondary | ICD-10-CM | POA: Insufficient documentation

## 2016-01-05 DIAGNOSIS — B009 Herpesviral infection, unspecified: Secondary | ICD-10-CM

## 2016-01-05 DIAGNOSIS — R896 Abnormal cytological findings in specimens from other organs, systems and tissues: Secondary | ICD-10-CM | POA: Diagnosis not present

## 2016-01-05 DIAGNOSIS — IMO0002 Reserved for concepts with insufficient information to code with codable children: Secondary | ICD-10-CM

## 2016-01-05 NOTE — Telephone Encounter (Signed)
Left message to call.

## 2016-01-05 NOTE — Progress Notes (Signed)
Chief complaint: 1. Follow-up on colposcopic directed biopsies for LGSIL Pap 2. Follow-up on STI testing   Colposcopic directed biopsy results:  Cervix 9:00-CIN-1  Cervix 6:00-benign  ECC- Negative   STD testing: All negative except for positive IgG for HSV 1 and HSV-2   Results reviewed. Findings were explained.   OBJECTIVE: BP 142/78 mmHg  Pulse 86  Ht 5\' 5"  (1.651 m)  Wt 132 lb 9.6 oz (60.147 kg)  BMI 22.07 kg/m2  ASSESSMENT: 1. Colposcopic directed biopsies to confirm LGSIL Pap smear findings 2. 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 Return in 6 months for interval colposcopy 2. Information regarding HSV management is given to her partner and patient.  A total of 15 minutes were spent face-to-face with the patient during this encounter and over half of that time dealt with counseling and coordination of care.  Brayton Mars, MD  Note: This dictation was prepared with Dragon dictation along with smaller phrase technology. Any transcriptional errors that result from this process are unintentional.

## 2016-01-11 ENCOUNTER — Ambulatory Visit (INDEPENDENT_AMBULATORY_CARE_PROVIDER_SITE_OTHER): Payer: Medicaid Other | Admitting: Pulmonary Disease

## 2016-01-11 ENCOUNTER — Other Ambulatory Visit: Payer: Self-pay | Admitting: Family Medicine

## 2016-01-11 ENCOUNTER — Encounter (INDEPENDENT_AMBULATORY_CARE_PROVIDER_SITE_OTHER): Payer: Self-pay

## 2016-01-11 ENCOUNTER — Encounter: Payer: Self-pay | Admitting: Pulmonary Disease

## 2016-01-11 VITALS — BP 138/80 | HR 97 | Wt 134.0 lb

## 2016-01-11 DIAGNOSIS — J449 Chronic obstructive pulmonary disease, unspecified: Secondary | ICD-10-CM

## 2016-01-11 DIAGNOSIS — F17200 Nicotine dependence, unspecified, uncomplicated: Secondary | ICD-10-CM

## 2016-01-11 DIAGNOSIS — Z720 Tobacco use: Secondary | ICD-10-CM | POA: Diagnosis not present

## 2016-01-11 DIAGNOSIS — J45909 Unspecified asthma, uncomplicated: Secondary | ICD-10-CM

## 2016-01-11 NOTE — Patient Instructions (Signed)
Continue Breo inhaler - use EVERY DAY Continue Combivent inhaler as needed Smoking cessation as we have discussed Follow up in 3-4 months or as needed

## 2016-01-11 NOTE — Telephone Encounter (Signed)
Left message to call.

## 2016-01-12 ENCOUNTER — Encounter: Payer: Self-pay | Admitting: Family Medicine

## 2016-01-12 ENCOUNTER — Ambulatory Visit (INDEPENDENT_AMBULATORY_CARE_PROVIDER_SITE_OTHER): Payer: Medicaid Other | Admitting: Family Medicine

## 2016-01-12 VITALS — BP 140/74 | HR 88 | Temp 99.1°F | Resp 16 | Ht 65.0 in | Wt 130.6 lb

## 2016-01-12 DIAGNOSIS — J449 Chronic obstructive pulmonary disease, unspecified: Secondary | ICD-10-CM

## 2016-01-12 DIAGNOSIS — I2510 Atherosclerotic heart disease of native coronary artery without angina pectoris: Secondary | ICD-10-CM | POA: Diagnosis not present

## 2016-01-12 DIAGNOSIS — R8789 Other abnormal findings in specimens from female genital organs: Secondary | ICD-10-CM | POA: Diagnosis not present

## 2016-01-12 DIAGNOSIS — B182 Chronic viral hepatitis C: Secondary | ICD-10-CM | POA: Diagnosis not present

## 2016-01-12 DIAGNOSIS — N870 Mild cervical dysplasia: Secondary | ICD-10-CM

## 2016-01-12 DIAGNOSIS — IMO0002 Reserved for concepts with insufficient information to code with codable children: Secondary | ICD-10-CM

## 2016-01-12 DIAGNOSIS — I441 Atrioventricular block, second degree: Secondary | ICD-10-CM | POA: Diagnosis not present

## 2016-01-12 DIAGNOSIS — F3170 Bipolar disorder, currently in remission, most recent episode unspecified: Secondary | ICD-10-CM

## 2016-01-12 DIAGNOSIS — S069X5S Unspecified intracranial injury with loss of consciousness greater than 24 hours with return to pre-existing conscious level, sequela: Secondary | ICD-10-CM

## 2016-01-12 DIAGNOSIS — R569 Unspecified convulsions: Secondary | ICD-10-CM

## 2016-01-12 DIAGNOSIS — Z720 Tobacco use: Secondary | ICD-10-CM

## 2016-01-12 DIAGNOSIS — I10 Essential (primary) hypertension: Secondary | ICD-10-CM

## 2016-01-12 DIAGNOSIS — B977 Papillomavirus as the cause of diseases classified elsewhere: Secondary | ICD-10-CM | POA: Diagnosis not present

## 2016-01-12 MED ORDER — QUETIAPINE FUMARATE 100 MG PO TABS: ORAL_TABLET | ORAL | Status: DC

## 2016-01-12 MED ORDER — QUETIAPINE FUMARATE 50 MG PO TABS: 50.0000 mg | ORAL_TABLET | ORAL | Status: DC

## 2016-01-12 MED ORDER — LORAZEPAM 0.5 MG PO TABS: 0.5000 mg | ORAL_TABLET | Freq: Three times a day (TID) | ORAL | Status: DC | PRN

## 2016-01-12 NOTE — Telephone Encounter (Signed)
I spoke with patient today She has had the CT scan of the chest She has not had the mammo and [place]; I instructed her to go to [place] to sign records release She was given number to schedule DEXA
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
## 2016-01-12 NOTE — Patient Instructions (Addendum)
Please go by [facility] breast center or imaging center to sign releases to get your previous breast imaging films and then have them schedule those mammograms and ultrasounds as soon as possible Please do call to schedule your bone density study; the number to schedule one at either [facility] or [facility] is [phone]  Check out [facility] [address], [place], [place] [address]  [email or web address]  [phone]   Only use the lorazepam for stormy moments, those 15 pills should last you 30 days Increase the morning seroquel to 150 mg total (100+50) and continue 200 mg (100+100) at night  Do call Dr. Melrose Nakayama about your big 600 mg pill to see what he wants to do with that

## 2016-01-14 ENCOUNTER — Ambulatory Visit (INDEPENDENT_AMBULATORY_CARE_PROVIDER_SITE_OTHER): Payer: Medicaid Other | Admitting: Psychiatry

## 2016-01-14 ENCOUNTER — Encounter: Payer: Self-pay | Admitting: Psychiatry

## 2016-01-14 VITALS — BP 122/78 | HR 92 | Temp 98.1°F | Ht 65.0 in | Wt 131.4 lb

## 2016-01-14 DIAGNOSIS — F1210 Cannabis abuse, uncomplicated: Secondary | ICD-10-CM

## 2016-01-14 DIAGNOSIS — F1024 Alcohol dependence with alcohol-induced mood disorder: Secondary | ICD-10-CM

## 2016-01-14 DIAGNOSIS — F3160 Bipolar disorder, current episode mixed, unspecified: Secondary | ICD-10-CM | POA: Diagnosis not present

## 2016-01-14 MED ORDER — QUETIAPINE FUMARATE 100 MG PO TABS: 100.0000 mg | ORAL_TABLET | Freq: Two times a day (BID) | ORAL | Status: DC

## 2016-01-14 MED ORDER — LITHIUM CARBONATE 150 MG PO CAPS: 450.0000 mg | ORAL_CAPSULE | Freq: Every day | ORAL | Status: DC
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
## 2016-01-14 NOTE — Progress Notes (Signed)
PULMONARY OFFICE NOTE Primary MD: Enid Derry Date of initial consultation: 10/22/14  Problems: smoker, COPD, dyspnea, CAD Last IMPRESSION/PLAN (10/23/15):  COPD, Asthmatic bronchitis with wheezing, Smoker. PLAN: Counseled re: smoking cessation. Begin Breo 100/25 - one actuation daily. Cont PRN albuterol. ROV 3-4 wks with PFTs, CXR LDCT 11/06/15:  no suspicious lung lesions but extensive coronary calcifications noted. 11/19/15 PFT: mild obstruction, mild decrease in DLCO. Marginal improvement after BD challenge 11/19/15 CXR: NACPD 11/24/15: Symptoms unchanged. Not using Breo as prescribed. Expressed concern re: cardiac risk. Tried Chantix X 1 week only. Continues to smoke 1 ppd. Counseled re: smoking cessation. Discussed safety of Breo and recommended resumption. Recommended ASA pending eval by Cardiology.   INTERVAL HISTORY: Cardiology eval 03/20: . NM myocardial perfusion SPECT multiple (stress and rest), ECG stress test ordered. Results unknown to patient   SUBJ: Not using Chantix consistently. Continues to smoke but down to 1-3 cigs/day. Feels that her breathing is better. Despite our discussion last visit, she has not refilled Breo. No new complaints. Denies CP, fever, purulent sputum, hemoptysis, LE edema and calf tenderness    Filed Vitals:   01/11/16 1127  BP: 138/80  Pulse: 97  Weight: 134 lb (60.782 kg)  SpO2: 96%    EXAM:  Gen: Thin, not cachectic, No overt respiratory distress HEENT: NCAT, sclera white, oropharynx normal Neck: No JVD Lungs: breath sounds diminished, no wheezes Cardiovascular: Reg, no murmurs noted Abdomen: Soft, nontender, normal BS Ext: without clubbing, cyanosis, edema Neuro: grossly intact  DATA:   BMP Latest Ref Rng 09/17/2015  Glucose 65 - 99 mg/dL 93  BUN 8 - 27 mg/dL 12  Creatinine 0.57 - 1.00 mg/dL 0.99  BUN/Creat Ratio 11 - 26 12  Sodium 134 - 144 mmol/L 136  Potassium 3.5 - 5.2 mmol/L 4.8  Chloride 96 - 106 mmol/L 103  CO2 18 - 29  mmol/L 20  Calcium 8.7 - 10.3 mg/dL 9.7    CBC Latest Ref Rng 09/17/2015  WBC 3.4 - 10.8 x10E3/uL 8.2  Hematocrit 34.0 - 46.6 % 47.1(H)  Platelets 150 - 379 x10E3/uL 167    CXR:  NNF  IMPRESSION:   Chronic obstructive pulmonary disease - mild by PFTs. Asthmatic component Recalcitrant smoker  PLAN:  Counseled again re: smoking cessation. Recommended more consistent use of Chantix Resume Breo 100/25 - one actuation daily Cont PRN albuterol ROV 4 mos   Merton Border, MD PCCM service Mobile [phone] Pager [phone] 01/14/2016

## 2016-01-15 ENCOUNTER — Telehealth: Payer: Self-pay | Admitting: Family Medicine

## 2016-01-15 NOTE — Telephone Encounter (Signed)
Pt wants to know if you would call her. Said that she has found out something else is wrong and wants you to know ( the dr). Pt said that she can not talk after 11:30 am so will need to call before 11:30 am any day. Would not give me any details or what she has found out that she wants you to know.

## 2016-01-18 ENCOUNTER — Other Ambulatory Visit
Admission: RE | Admit: 2016-01-18 | Discharge: 2016-01-18 | Disposition: A | Discharge status: Home / Self Care | Payer: Medicaid Other | Source: Ambulatory Visit | Attending: Psychiatry | Admitting: Psychiatry

## 2016-01-18 DIAGNOSIS — F1024 Alcohol dependence with alcohol-induced mood disorder: Secondary | ICD-10-CM | POA: Insufficient documentation

## 2016-01-18 DIAGNOSIS — F3160 Bipolar disorder, current episode mixed, unspecified: Secondary | ICD-10-CM | POA: Diagnosis present

## 2016-01-18 LAB — LIPID PANEL
Cholesterol: 170 mg/dL (ref 0–200)
HDL: 47 mg/dL (ref >40)
LDL Cholesterol: 99 mg/dL (ref 0–99)
Total CHOL/HDL Ratio: 3.6 RATIO
Triglycerides: 120 mg/dL (ref <150)
VLDL: 24 mg/dL (ref 0–40)

## 2016-01-18 LAB — COMPREHENSIVE METABOLIC PANEL
ALT: 32 U/L (ref 14–54)
AST: 38 U/L (ref 15–41)
Albumin: 4.3 g/dL (ref 3.5–5.0)
Alkaline Phosphatase: 59 U/L (ref 38–126)
Anion gap: 7 (ref 5–15)
BUN: 12 mg/dL (ref 6–20)
CO2: 21 mmol/L — ABNORMAL LOW (ref 22–32)
Calcium: 9.6 mg/dL (ref 8.9–10.3)
Chloride: 109 mmol/L (ref 101–111)
Creatinine, Ser: 0.88 mg/dL (ref 0.44–1.00)
GFR calc Af Amer: >60 mL/min (ref >60)
GFR calc non Af Amer: >60 mL/min (ref >60)
Glucose, Bld: 122 mg/dL — ABNORMAL HIGH (ref 65–99)
Potassium: 3.7 mmol/L (ref 3.5–5.1)
Sodium: 137 mmol/L (ref 135–145)
Total Bilirubin: 0.7 mg/dL (ref 0.3–1.2)
Total Protein: 8.2 g/dL — ABNORMAL HIGH (ref 6.5–8.1)

## 2016-01-18 LAB — CBC WITH DIFFERENTIAL/PLATELET
Basophils Absolute: 0.1 10*3/uL (ref 0–0.1)
Basophils Relative: 1 %
Eosinophils Absolute: 0.1 10*3/uL (ref 0–0.7)
Eosinophils Relative: 2 %
HCT: 43.8 % (ref 35.0–47.0)
Hemoglobin: 15 g/dL (ref 12.0–16.0)
Lymphocytes Relative: 34 %
Lymphs Abs: 2.3 10*3/uL (ref 1.0–3.6)
MCH: 32.1 pg (ref 26.0–34.0)
MCHC: 34.3 g/dL (ref 32.0–36.0)
MCV: 93.6 fL (ref 80.0–100.0)
Monocytes Absolute: 0.3 10*3/uL (ref 0.2–0.9)
Monocytes Relative: 5 %
Neutro Abs: 3.8 10*3/uL (ref 1.4–6.5)
Neutrophils Relative %: 58 %
Platelets: 125 10*3/uL — ABNORMAL LOW (ref 150–440)
RBC: 4.68 MIL/uL (ref 3.80–5.20)
RDW: 13.6 % (ref 11.5–14.5)
WBC: 6.6 10*3/uL (ref 3.6–11.0)

## 2016-01-18 LAB — LITHIUM LEVEL: Lithium Lvl: 0.62 mmol/L (ref 0.60–1.20)

## 2016-01-18 LAB — TSH: TSH: 1.514 u[IU]/mL (ref 0.350–4.500)

## 2016-01-18 NOTE — Telephone Encounter (Signed)
I have tried to cal patient several times and have left voicemails

## 2016-01-19 LAB — PROLACTIN: Prolactin: 13.9 ng/mL (ref 4.8–23.3)

## 2016-01-24 ENCOUNTER — Other Ambulatory Visit: Payer: Self-pay | Admitting: Family Medicine

## 2016-01-25 ENCOUNTER — Other Ambulatory Visit: Payer: Self-pay | Admitting: Family Medicine

## 2016-01-25 NOTE — Telephone Encounter (Signed)
She is now seeing psychiatrist, Dr. Gretel Acre

## 2016-01-26 ENCOUNTER — Ambulatory Visit (INDEPENDENT_AMBULATORY_CARE_PROVIDER_SITE_OTHER): Payer: Medicaid Other | Admitting: Psychiatry

## 2016-01-26 ENCOUNTER — Encounter: Payer: Self-pay | Admitting: Psychiatry

## 2016-01-26 VITALS — BP 122/76 | HR 86 | Temp 98.0°F | Ht 65.0 in | Wt 132.6 lb

## 2016-01-26 DIAGNOSIS — F3160 Bipolar disorder, current episode mixed, unspecified: Secondary | ICD-10-CM | POA: Diagnosis not present

## 2016-01-26 DIAGNOSIS — F1024 Alcohol dependence with alcohol-induced mood disorder: Secondary | ICD-10-CM

## 2016-01-26 MED ORDER — QUETIAPINE FUMARATE 50 MG PO TABS: 50.0000 mg | ORAL_TABLET | Freq: Every day | ORAL | Status: DC

## 2016-01-26 MED ORDER — QUETIAPINE FUMARATE 200 MG PO TABS: 200.0000 mg | ORAL_TABLET | Freq: Every day | ORAL | Status: DC

## 2016-01-26 MED ORDER — OXCARBAZEPINE 600 MG PO TABS: 600.0000 mg | ORAL_TABLET | Freq: Two times a day (BID) | ORAL | Status: DC

## 2016-01-26 MED ORDER — LORAZEPAM 0.5 MG PO TABS: 0.5000 mg | ORAL_TABLET | Freq: Two times a day (BID) | ORAL | Status: DC | PRN

## 2016-01-26 MED ORDER — LITHIUM CARBONATE 150 MG PO CAPS: 450.0000 mg | ORAL_CAPSULE | Freq: Every day | ORAL | Status: DC
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
## 2016-01-31 NOTE — Assessment & Plan Note (Signed)
To see cardiologist; urged smoking cessation

## 2016-01-31 NOTE — Assessment & Plan Note (Signed)
Managed by neurologist; she is having issues with tolerating large 600 mg pill; I encouraged her to call her neurologist; don't just stop it

## 2016-01-31 NOTE — Assessment & Plan Note (Signed)
Fair control; continue medicine

## 2016-01-31 NOTE — Assessment & Plan Note (Signed)
Smoking cessation encouraged; I am here to help if/when ready to quit

## 2016-01-31 NOTE — Assessment & Plan Note (Signed)
Followed by gastroenterologist; will have repeat US and alpha-fetoprotein in the Fall

## 2016-01-31 NOTE — Assessment & Plan Note (Signed)
A source of frustration for patient; seen by neurologist; supportive husband

## 2016-01-31 NOTE — Assessment & Plan Note (Signed)
To see psychiatrist soon; use benzo for only stormy moments; see AVS

## 2016-01-31 NOTE — Assessment & Plan Note (Signed)
Followed by gynecologist, Dr. Enzo Bi

## 2016-01-31 NOTE — Assessment & Plan Note (Signed)
To see cardiologist

## 2016-01-31 NOTE — Assessment & Plan Note (Signed)
Followed by gynecologist

## 2016-01-31 NOTE — Assessment & Plan Note (Signed)
Followed by pulmonologist; reiterated importance of smoking cessation

## 2016-02-01 ENCOUNTER — Telehealth: Payer: Self-pay

## 2016-02-01 ENCOUNTER — Ambulatory Visit: Payer: Self-pay | Admitting: Family Medicine

## 2016-02-01 NOTE — Telephone Encounter (Signed)
Pt woke up this am with swollen lymph node her dentist gave her an antibiotic amoxicillin and gave her a # for ENT.  They called trying to get her in here but you are booked up til next wed. So Wanted to give you this information and let you know they will try to schedule with ENT.

## 2016-02-01 NOTE — Telephone Encounter (Signed)
Thank you :)

## 2016-02-08 ENCOUNTER — Encounter: Payer: Self-pay | Admitting: Family Medicine

## 2016-02-08 ENCOUNTER — Ambulatory Visit (INDEPENDENT_AMBULATORY_CARE_PROVIDER_SITE_OTHER): Payer: Medicaid Other | Admitting: Family Medicine

## 2016-02-08 VITALS — BP 122/64 | HR 76 | Temp 99.0°F | Resp 16 | Wt 130.3 lb

## 2016-02-08 DIAGNOSIS — I2510 Atherosclerotic heart disease of native coronary artery without angina pectoris: Secondary | ICD-10-CM

## 2016-02-08 DIAGNOSIS — R351 Nocturia: Secondary | ICD-10-CM

## 2016-02-08 DIAGNOSIS — F3170 Bipolar disorder, currently in remission, most recent episode unspecified: Secondary | ICD-10-CM | POA: Diagnosis not present
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
## 2016-02-08 NOTE — Patient Instructions (Addendum)
Have the urine collected today Touched by Fire and An [place] by Ok Edwards, which are great books about bipolar disorder Reschedule the stress test Return for next appointment

## 2016-02-09 ENCOUNTER — Telehealth: Payer: Self-pay | Admitting: Family Medicine

## 2016-02-09 MED ORDER — NITROFURANTOIN MONOHYD MACRO 100 MG PO CAPS: 100.0000 mg | ORAL_CAPSULE | Freq: Two times a day (BID) | ORAL | Status: AC

## 2016-02-09 NOTE — Telephone Encounter (Signed)
Urine reviewed; appears to have bladder infection; left msg; start antibiotics tonight, sent to [facility]; culture pending

## 2016-02-11 LAB — MICROSCOPIC EXAMINATION: WBC, UA: >30 /hpf — AB (ref 0–?)

## 2016-02-11 LAB — UA/M W/RFLX CULTURE, ROUTINE
Bilirubin, UA: NEGATIVE
Glucose, UA: NEGATIVE
Nitrite, UA: POSITIVE — AB
Specific Gravity, UA: 1.019 (ref 1.005–1.030)
Urobilinogen, Ur: 1 mg/dL (ref 0.2–1.0)
pH, UA: 6 (ref 5.0–7.5)

## 2016-02-11 LAB — URINE CULTURE, REFLEX

## 2016-02-12 ENCOUNTER — Other Ambulatory Visit: Payer: Self-pay | Admitting: Family Medicine

## 2016-02-12 NOTE — Telephone Encounter (Signed)
Patient should be getting this from psychiatrist please

## 2016-02-12 NOTE — Telephone Encounter (Signed)
She should be getting these from her psychiatrist from now on please; we hope she has a nice weekend

## 2016-02-14 ENCOUNTER — Encounter: Payer: Self-pay | Admitting: Family Medicine

## 2016-02-14 NOTE — Assessment & Plan Note (Signed)
Patient's psych state is greatly improved relative to last visit; appreciate the management by her psychiatrist

## 2016-02-14 NOTE — Assessment & Plan Note (Signed)
Patient missed her stress test appointment, but they will reschedule and get that done ASAP; seeing cardiologist

## 2016-02-15 ENCOUNTER — Other Ambulatory Visit: Payer: Self-pay

## 2016-02-15 NOTE — Telephone Encounter (Signed)
pt husband called states pt needs refills on all her medication. he states that lithium , lorazepam, seroquel 50mg  and 100mg  needs to be sent in. pt was last seen on 01-26-16 next appt  02-29-16

## 2016-02-16 MED ORDER — QUETIAPINE FUMARATE 100 MG PO TABS: ORAL_TABLET | ORAL | Status: DC

## 2016-02-16 NOTE — Telephone Encounter (Signed)
Seroquel 100mg  - 1/2 pill in am and 1 pill at bed at bed time #45 refilled.

## 2016-02-24 ENCOUNTER — Other Ambulatory Visit: Payer: Self-pay

## 2016-02-24 NOTE — Telephone Encounter (Signed)
received fax requesting a refill for lorazepam you did not give patient a whole month supply pt was last seen on  01-26-16 next appt 02-29-16

## 2016-02-26 MED ORDER — LORAZEPAM 0.5 MG PO TABS: 0.5000 mg | ORAL_TABLET | Freq: Two times a day (BID) | ORAL | Status: DC | PRN

## 2016-02-29 ENCOUNTER — Ambulatory Visit (INDEPENDENT_AMBULATORY_CARE_PROVIDER_SITE_OTHER): Payer: Medicaid Other | Admitting: Psychiatry

## 2016-02-29 ENCOUNTER — Encounter: Payer: Self-pay | Admitting: Psychiatry

## 2016-02-29 VITALS — BP 122/78 | HR 90 | Temp 98.2°F | Ht 65.0 in | Wt 130.2 lb

## 2016-02-29 DIAGNOSIS — F1024 Alcohol dependence with alcohol-induced mood disorder: Secondary | ICD-10-CM | POA: Diagnosis not present

## 2016-02-29 DIAGNOSIS — F3160 Bipolar disorder, current episode mixed, unspecified: Secondary | ICD-10-CM | POA: Diagnosis not present

## 2016-02-29 MED ORDER — QUETIAPINE FUMARATE 100 MG PO TABS: ORAL_TABLET | ORAL | Status: DC

## 2016-02-29 MED ORDER — OXCARBAZEPINE 600 MG PO TABS: 600.0000 mg | ORAL_TABLET | Freq: Two times a day (BID) | ORAL | Status: DC

## 2016-02-29 MED ORDER — LORAZEPAM 0.5 MG PO TABS: 0.5000 mg | ORAL_TABLET | Freq: Every day | ORAL | Status: DC
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
## 2016-03-14 DIAGNOSIS — R6889 Other general symptoms and signs: Secondary | ICD-10-CM | POA: Insufficient documentation

## 2016-03-14 DIAGNOSIS — M25551 Pain in right hip: Secondary | ICD-10-CM | POA: Insufficient documentation

## 2016-03-17 ENCOUNTER — Encounter: Payer: Self-pay | Admitting: Family Medicine

## 2016-03-17 ENCOUNTER — Other Ambulatory Visit: Payer: Self-pay | Admitting: Family Medicine

## 2016-03-17 DIAGNOSIS — G8929 Other chronic pain: Secondary | ICD-10-CM

## 2016-03-17 DIAGNOSIS — M25559 Pain in unspecified hip: Principal | ICD-10-CM

## 2016-03-17 HISTORY — DX: Other chronic pain: G89.29

## 2016-03-18 ENCOUNTER — Encounter: Payer: Self-pay | Admitting: Family Medicine

## 2016-03-23 ENCOUNTER — Ambulatory Visit (INDEPENDENT_AMBULATORY_CARE_PROVIDER_SITE_OTHER): Payer: Medicaid Other | Admitting: Psychiatry

## 2016-03-23 ENCOUNTER — Encounter: Payer: Self-pay | Admitting: Psychiatry

## 2016-03-23 VITALS — BP 122/78 | HR 89 | Temp 98.5°F | Ht 65.0 in | Wt 130.4 lb

## 2016-03-23 DIAGNOSIS — F3160 Bipolar disorder, current episode mixed, unspecified: Secondary | ICD-10-CM | POA: Diagnosis not present

## 2016-03-23 DIAGNOSIS — F1024 Alcohol dependence with alcohol-induced mood disorder: Secondary | ICD-10-CM

## 2016-03-23 MED ORDER — QUETIAPINE FUMARATE 100 MG PO TABS: ORAL_TABLET | ORAL | Status: DC

## 2016-03-23 MED ORDER — LORAZEPAM 0.5 MG PO TABS: 0.5000 mg | ORAL_TABLET | Freq: Every day | ORAL | Status: DC

## 2016-03-23 MED ORDER — AMANTADINE HCL 100 MG PO CAPS: 100.0000 mg | ORAL_CAPSULE | Freq: Every day | ORAL | Status: DC

## 2016-03-23 MED ORDER — LITHIUM CARBONATE 150 MG PO CAPS: 450.0000 mg | ORAL_CAPSULE | Freq: Every day | ORAL | Status: DC

## 2016-03-23 MED ORDER — OXCARBAZEPINE 600 MG PO TABS: 600.0000 mg | ORAL_TABLET | Freq: Two times a day (BID) | ORAL | Status: DC
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
## 2016-04-12 ENCOUNTER — Ambulatory Visit (INDEPENDENT_AMBULATORY_CARE_PROVIDER_SITE_OTHER): Payer: Medicaid Other | Admitting: Family Medicine

## 2016-04-12 ENCOUNTER — Encounter: Payer: Self-pay | Admitting: Family Medicine

## 2016-04-12 VITALS — BP 174/80 | HR 87 | Temp 98.1°F | Resp 16 | Wt 123.0 lb

## 2016-04-12 DIAGNOSIS — Z87891 Personal history of nicotine dependence: Secondary | ICD-10-CM

## 2016-04-12 DIAGNOSIS — Z720 Tobacco use: Secondary | ICD-10-CM

## 2016-04-12 DIAGNOSIS — I10 Essential (primary) hypertension: Secondary | ICD-10-CM

## 2016-04-12 DIAGNOSIS — I2510 Atherosclerotic heart disease of native coronary artery without angina pectoris: Secondary | ICD-10-CM

## 2016-04-12 DIAGNOSIS — F411 Generalized anxiety disorder: Secondary | ICD-10-CM

## 2016-04-12 NOTE — Assessment & Plan Note (Signed)
Followed by cardiologist; patient to contact cardiologist today about weeks of chest pain; EKG today and will fax to his office
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
## 2016-04-12 NOTE — Patient Instructions (Addendum)
Please do contact your heart doctor TODAY and let them know about your ongoing chest pain so they can work this up quickly If you develop any symptoms you think are a heart attack, call 911 Call Dr. Clayborn Bigness  12 Ways to [place]  ?Anxiety is normal human sensation. It is what helped our ancestors survive the pitfalls of the wilderness. Anxiety is defined as experiencing worry or nervousness about an imminent event or something with an uncertain outcome. It is a feeling experienced by most people at some point in their lives. Anxiety can be triggered by a very personal issue, such as the illness of a loved one, or an event of global proportions, such as a refugee crisis. Some of the symptoms of anxiety are:  Feeling restless.  Having a feeling of impending danger.  Increased heart rate.  Rapid breathing. Sweating.  Shaking.  Weakness or feeling tired.  Difficulty concentrating on anything except the current worry.  Insomnia.  Stomach or bowel problems. What can we do about anxiety we may be feeling? There are many techniques to help manage stress and relax. Here are 12 ways you can reduce your anxiety almost immediately: 1. Turn off the constant feed of information. Take a social media sabbatical. Studies have shown that social media directly contributes to social anxiety.  2. Monitor your television viewing habits. Are you watching shows that are also contributing to your anxiety, such as 24-hour news stations? Try watching something else, or better yet, nothing at all. Instead, listen to music, read an inspirational book or practice a hobby. 3. Eat nutritious meals. Also, don't skip meals and keep healthful snacks on hand. Hunger and poor diet contributes to feeling anxious. 4. Sleep. Sleeping on a regular schedule for at least seven to eight hours a night will do wonders for your outlook when you are awake. 5. Exercise. Regular exercise will help rid your body of that anxious energy and  help you get more restful sleep. 6. Try deep (diaphragmatic) breathing. Inhale slowly through your nose for five seconds and exhale through your mouth. 7. Practice acceptance and gratitude. When anxiety hits, accept that there are things out of your control that shouldn't be of immediate concern.  8. Seek out humor. When anxiety strikes, watch a funny video, read jokes or call a friend who makes you laugh. Laughter is healing for our bodies and releases endorphins that are calming. 9. Stay positive. Take the effort to replace negative thoughts with positive ones. Try to see a stressful situation in a positive light. Try to come up with solutions rather than dwelling on the problem. 10. Figure out what triggers your anxiety. Keep a journal and make note of anxious moments and the events surrounding them. This will help you identify triggers you can avoid or even eliminate. 11. Talk to someone. Let a trusted friend, family member or even trained professional know that you are feeling overwhelmed and anxious. Verbalize what you are feeling and why.  12. Volunteer. If your anxiety is triggered by a crisis on a large scale, become an advocate and work to resolve the problem that is causing you unease. Anxiety is often unwelcome and can become overwhelming. If not kept in check, it can become a disorder that could require medical treatment. However, if you take the time to care for yourself and avoid the triggers that make you anxious, you will be able to find moments of relaxation and clarity that make your life much more enjoyable.  Steps to Quit Smoking  Smoking tobacco can be harmful to your health and can affect almost every organ in your body. Smoking puts you, and those around you, at risk for developing many serious chronic diseases. Quitting smoking is difficult, but it is one of the best things that you can do for your health. It is never too late to quit. WHAT ARE THE BENEFITS OF QUITTING  SMOKING? When you quit smoking, you lower your risk of developing serious diseases and conditions, such as:  Lung cancer or lung disease, such as COPD.  Heart disease.  Stroke.  Heart attack.  Infertility.  Osteoporosis and bone fractures. Additionally, symptoms such as coughing, wheezing, and shortness of breath may get better when you quit. You may also find that you get sick less often because your body is stronger at fighting off colds and infections. If you are pregnant, quitting smoking can help to reduce your chances of having a baby of low birth weight. HOW DO I GET READY TO QUIT? When you decide to quit smoking, create a plan to make sure that you are successful. Before you quit:  Pick a date to quit. Set a date within the next two weeks to give you time to prepare.  Write down the reasons why you are quitting. Keep this list in places where you will see it often, such as on your bathroom mirror or in your car or wallet.  Identify the people, places, things, and activities that make you want to smoke (triggers) and avoid them. Make sure to take these actions:  Throw away all cigarettes at home, at work, and in your car.  Throw away smoking accessories, such as Scientist, research (medical).  Clean your car and make sure to empty the ashtray.  Clean your home, including curtains and carpets.  Tell your family, friends, and coworkers that you are quitting. Support from your loved ones can make quitting easier.  Talk with your health care provider about your options for quitting smoking.  Find out what treatment options are covered by your health insurance. WHAT STRATEGIES CAN I USE TO QUIT SMOKING?  Talk with your healthcare provider about different strategies to quit smoking. Some strategies include:  Quitting smoking altogether instead of gradually lessening how much you smoke over a period of time. Research shows that quitting "cold [place]" is more successful than gradually  quitting.  Attending in-person counseling to help you build problem-solving skills. You are more likely to have success in quitting if you attend several counseling sessions. Even short sessions of 10 minutes can be effective.  Finding resources and support systems that can help you to quit smoking and remain smoke-free after you quit. These resources are most helpful when you use them often. They can include:  Online chats with a Social worker.  Telephone quitlines.  Printed Furniture conservator/restorer.  Support groups or group counseling.  Text messaging programs.  Mobile phone applications.  Taking medicines to help you quit smoking. (If you are pregnant or breastfeeding, talk with your health care provider first.) Some medicines contain nicotine and some do not. Both types of medicines help with cravings, but the medicines that include nicotine help to relieve withdrawal symptoms. Your health care provider may recommend:  Nicotine patches, gum, or lozenges.  Nicotine inhalers or sprays.  Non-nicotine medicine that is taken by mouth. Talk with your health care provider about combining strategies, such as taking medicines while you are also receiving in-person counseling. Using these two strategies together  makes you more likely to succeed in quitting than if you used either strategy on its own. If you are pregnant or breastfeeding, talk with your health care provider about finding counseling or other support strategies to quit smoking. Do not take medicine to help you quit smoking unless told to do so by your health care provider. WHAT THINGS CAN I DO TO MAKE IT EASIER TO QUIT? Quitting smoking might feel overwhelming at first, but there is a lot that you can do to make it easier. Take these important actions:  Reach out to your family and friends and ask that they support and encourage you during this time. Call telephone quitlines, reach out to support groups, or work with a counselor for  support.  Ask people who smoke to avoid smoking around you.  Avoid places that trigger you to smoke, such as bars, parties, or smoke-break areas at work.  Spend time around people who do not smoke.  Lessen stress in your life, because stress can be a smoking trigger for some people. To lessen stress, try:  Exercising regularly.  Deep-breathing exercises.  Yoga.  Meditating.  Performing a body scan. This involves closing your eyes, scanning your body from head to toe, and noticing which parts of your body are particularly tense. Purposefully relax the muscles in those areas.  Download or purchase mobile phone or tablet apps (applications) that can help you stick to your quit plan by providing reminders, tips, and encouragement. There are many free apps, such as QuitGuide from the [employer] Office manager for Disease Control and Prevention). You can find other support for quitting smoking (smoking cessation) through smokefree.gov and other websites. HOW WILL I FEEL WHEN I QUIT SMOKING? Within the first 24 hours of quitting smoking, you may start to feel some withdrawal symptoms. These symptoms are usually most noticeable 2-3 days after quitting, but they usually do not last beyond 2-3 weeks. Changes or symptoms that you might experience include:  Mood swings.  Restlessness, anxiety, or irritation.  Difficulty concentrating.  Dizziness.  Strong cravings for sugary foods in addition to nicotine.  Mild weight gain.  Constipation.  Nausea.  Coughing or a sore throat.  Changes in how your medicines work in your body.  A depressed mood.  Difficulty sleeping (insomnia). After the first 2-3 weeks of quitting, you may start to notice more positive results, such as:  Improved sense of smell and taste.  Decreased coughing and sore throat.  Slower heart rate.  Lower blood pressure.  Clearer skin.  The ability to breathe more easily.  Fewer sick days. Quitting smoking is very  challenging for most people. Do not get discouraged if you are not successful the first time. Some people need to make many attempts to quit before they achieve long-term success. Do your best to stick to your quit plan, and talk with your health care provider if you have any questions or concerns.   This information is not intended to replace advice given to you by your health care provider. Make sure you discuss any questions you have with your health care provider.   Document Released: 09/06/2001 Document Revised: 01/27/2015 Document Reviewed: 01/27/2015 Elsevier Interactive Patient Education [employer].

## 2016-04-21 ENCOUNTER — Ambulatory Visit: Payer: Medicaid Other | Admitting: Psychiatry

## 2016-05-27 ENCOUNTER — Telehealth: Payer: Self-pay | Admitting: Family Medicine

## 2016-05-27 NOTE — Telephone Encounter (Signed)
Those are rx she is suppost to be getting from psychiatry left voicemail

## 2016-06-06 ENCOUNTER — Ambulatory Visit: Payer: Medicaid Other | Admitting: Psychiatry

## 2016-06-09 ENCOUNTER — Ambulatory Visit (INDEPENDENT_AMBULATORY_CARE_PROVIDER_SITE_OTHER): Payer: Medicaid Other | Admitting: Psychiatry

## 2016-06-09 ENCOUNTER — Telehealth: Payer: Self-pay | Admitting: Gastroenterology

## 2016-06-09 ENCOUNTER — Encounter: Payer: Self-pay | Admitting: Psychiatry

## 2016-06-09 VITALS — BP 170/82 | HR 98 | Temp 98.3°F | Ht 65.0 in | Wt 121.8 lb

## 2016-06-09 DIAGNOSIS — F3160 Bipolar disorder, current episode mixed, unspecified: Secondary | ICD-10-CM

## 2016-06-09 DIAGNOSIS — F1210 Cannabis abuse, uncomplicated: Secondary | ICD-10-CM

## 2016-06-09 DIAGNOSIS — B192 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: Secondary | ICD-10-CM

## 2016-06-09 MED ORDER — QUETIAPINE FUMARATE 100 MG PO TABS: ORAL_TABLET | ORAL | 1 refills | Status: DC

## 2016-06-09 MED ORDER — AMANTADINE HCL 100 MG PO CAPS: 100.0000 mg | ORAL_CAPSULE | Freq: Every day | ORAL | 1 refills | Status: DC

## 2016-06-09 MED ORDER — LITHIUM CARBONATE 150 MG PO CAPS: 450.0000 mg | ORAL_CAPSULE | Freq: Every day | ORAL | 1 refills | Status: DC

## 2016-06-09 MED ORDER — OXCARBAZEPINE 600 MG PO TABS: 600.0000 mg | ORAL_TABLET | Freq: Two times a day (BID) | ORAL | 1 refills | Status: DC

## 2016-06-09 NOTE — Telephone Encounter (Signed)
Patients husband called with her on the line and needs to know if she needs an appointment with Dr. Allen Norris.
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
## 2016-06-10 ENCOUNTER — Ambulatory Visit: Payer: Self-pay | Admitting: Family Medicine

## 2016-06-10 NOTE — Telephone Encounter (Signed)
LVM for husband to return my call.

## 2016-06-15 ENCOUNTER — Encounter: Payer: Self-pay | Admitting: Family Medicine

## 2016-06-15 NOTE — Assessment & Plan Note (Signed)
Encouraged pt to quit smoking

## 2016-06-15 NOTE — Assessment & Plan Note (Signed)
Uncontrolled; patient visibly upset, supportive listening provided; she will work on anxiety with her psychiatrist

## 2016-06-15 NOTE — Assessment & Plan Note (Signed)
See after visit summary for ideas given to help deal with stress; affecting her BP apparently today; relaxation, rest, breathing exercises

## 2016-06-17 NOTE — Addendum Note (Signed)
Addended byGlennie Isle E on: 06/17/2016 03:59 PM   Modules accepted: Orders

## 2016-06-17 NOTE — Telephone Encounter (Signed)
Left vm again for husband to return my call. Pt will need a repeat AFP and RUQ abdominal US. Needs to call me to schedule appt time.

## 2016-06-24 ENCOUNTER — Telehealth: Payer: Self-pay | Admitting: Gastroenterology

## 2016-06-24 ENCOUNTER — Encounter: Payer: Self-pay | Admitting: Family Medicine

## 2016-06-24 ENCOUNTER — Ambulatory Visit (INDEPENDENT_AMBULATORY_CARE_PROVIDER_SITE_OTHER): Payer: Medicaid Other | Admitting: Family Medicine

## 2016-06-24 DIAGNOSIS — M6281 Muscle weakness (generalized): Secondary | ICD-10-CM | POA: Diagnosis not present

## 2016-06-24 DIAGNOSIS — S069X5S Unspecified intracranial injury with loss of consciousness greater than 24 hours with return to pre-existing conscious level, sequela: Secondary | ICD-10-CM

## 2016-06-24 DIAGNOSIS — H9012 Conductive hearing loss, unilateral, left ear, with unrestricted hearing on the contralateral side: Secondary | ICD-10-CM | POA: Diagnosis not present

## 2016-06-24 DIAGNOSIS — M79605 Pain in left leg: Secondary | ICD-10-CM

## 2016-06-24 DIAGNOSIS — M79604 Pain in right leg: Secondary | ICD-10-CM

## 2016-06-24 DIAGNOSIS — R29898 Other symptoms and signs involving the musculoskeletal system: Secondary | ICD-10-CM | POA: Insufficient documentation

## 2016-06-24 DIAGNOSIS — H9192 Unspecified hearing loss, left ear: Secondary | ICD-10-CM | POA: Insufficient documentation

## 2016-06-24 NOTE — Assessment & Plan Note (Signed)
Refer for physical therapy  

## 2016-06-24 NOTE — Patient Instructions (Signed)
We'll have you see the physical therapist and the ENT Practice good fall precautions

## 2016-06-24 NOTE — Telephone Encounter (Signed)
Patients husband returned your call. They are at her 2:15 dr appointment at this time but would like for you to call today.

## 2016-06-24 NOTE — Assessment & Plan Note (Signed)
Refer to PT; fall risk
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
## 2016-06-24 NOTE — Telephone Encounter (Signed)
Spoke with pt's husband and he has asked me to call back at 11:30am today.

## 2016-06-24 NOTE — Assessment & Plan Note (Signed)
Refer to ENT for evaluation; hearing loss of left ear is intermittent

## 2016-06-26 NOTE — Assessment & Plan Note (Signed)
Support offered to patient and her husband; she struggles with memory and mood; discussed notebook to keep memories, plans, appts, things said so she could refer back to them

## 2016-06-30 ENCOUNTER — Ambulatory Visit (INDEPENDENT_AMBULATORY_CARE_PROVIDER_SITE_OTHER): Payer: Medicaid Other | Admitting: Pulmonary Disease

## 2016-06-30 ENCOUNTER — Encounter: Payer: Self-pay | Admitting: Pulmonary Disease

## 2016-06-30 VITALS — BP 132/72 | HR 83 | Ht 66.0 in | Wt 123.0 lb

## 2016-06-30 DIAGNOSIS — J449 Chronic obstructive pulmonary disease, unspecified: Secondary | ICD-10-CM

## 2016-06-30 DIAGNOSIS — R0609 Other forms of dyspnea: Secondary | ICD-10-CM

## 2016-06-30 DIAGNOSIS — F17200 Nicotine dependence, unspecified, uncomplicated: Secondary | ICD-10-CM | POA: Diagnosis not present

## 2016-06-30 DIAGNOSIS — R0600 Dyspnea, unspecified: Secondary | ICD-10-CM

## 2016-06-30 MED ORDER — FLUTICASONE-SALMETEROL 250-50 MCG/DOSE IN AEPB: 1.0000 | INHALATION_SPRAY | Freq: Two times a day (BID) | RESPIRATORY_TRACT | 11 refills | Status: DC

## 2016-06-30 MED ORDER — IPRATROPIUM-ALBUTEROL 20-100 MCG/ACT IN AERS: 1.0000 | INHALATION_SPRAY | Freq: Four times a day (QID) | RESPIRATORY_TRACT | 2 refills | Status: DC | PRN

## 2016-06-30 NOTE — Patient Instructions (Addendum)
1) You must quit smoking if we are to make any progress in your breathing 2) I encourage that you increase your activity level - this will help your breathing, make it easier to quit smoking and help keep the weight off as you quit smoking. Walking is the best exercise 3) Resume Breo inhaler  4) continue albuterol inhaler as needed

## 2016-07-03 ENCOUNTER — Encounter: Payer: Self-pay | Admitting: Pulmonary Disease

## 2016-07-03 NOTE — Progress Notes (Signed)
PULMONARY OFFICE NOTE Primary MD: Enid Derry Date of initial consultation: 10/22/14  Problems: smoker, COPD, dyspnea, CAD  LDCT 11/06/15:  no suspicious lung lesions but extensive coronary calcifications noted. 11/19/15 PFT: mild obstruction, mild decrease in DLCO. Marginal improvement after BD challenge 11/19/15 CXR: NACPD  INTERVAL HISTORY: Left town and now has returned to this area  SUBJ: Remains markedly limited by DOE. Still smoking 1 ppd. Also reports chest tightness. Needs refills of her inhaler meds    Vitals:   06/30/16 1217 06/30/16 1218  BP:  132/72  Pulse:  83  SpO2:  97%  Weight: 123 lb (55.8 kg)   Height: 5\' 6"  (1.676 m)     EXAM:  Gen: NAD HEENT: WNL Lungs: breath sounds diminished, no wheezes Cardiovascular: Reg, no murmurs noted Abdomen: Soft, nontender, normal BS Ext: without clubbing, cyanosis, edema Neuro: grossly intact  DATA:   BMP Latest Ref Rng & Units 01/18/2016 09/17/2015  Glucose 65 - 99 mg/dL 122(H) 93  BUN 6 - 20 mg/dL 12 12  Creatinine 0.44 - 1.00 mg/dL 0.88 0.99  BUN/Creat Ratio 11 - 26 - 12  Sodium 135 - 145 mmol/L 137 136  Potassium 3.5 - 5.1 mmol/L 3.7 4.8  Chloride 101 - 111 mmol/L 109 103  CO2 22 - 32 mmol/L 21(L) 20  Calcium 8.9 - 10.3 mg/dL 9.6 9.7    CBC Latest Ref Rng & Units 01/18/2016 09/17/2015  WBC 3.6 - 11.0 K/uL 6.6 8.2  Hemoglobin 12.0 - 16.0 g/dL 15.0 -  Hematocrit 35.0 - 47.0 % 43.8 47.1(H)  Platelets 150 - 440 K/uL 125(L) 167    CXR:  NNF  IMPRESSION:   Chronic obstructive pulmonary disease - mild by PFTs.  Severe DOE - in part due to deconditioning Recalcitrant smoker  PLAN:  Counseled again re: smoking cessation. Resume Advair 25/50 - one actuation BID Cont PRN Combivent Discussed increasing activity level ROV 4 mos   Merton Border, MD PCCM service Mobile [phone] Pager [phone] 07/03/2016

## 2016-07-04 ENCOUNTER — Encounter: Payer: Self-pay | Admitting: Psychiatry

## 2016-07-04 ENCOUNTER — Ambulatory Visit (INDEPENDENT_AMBULATORY_CARE_PROVIDER_SITE_OTHER): Payer: Medicaid Other | Admitting: Psychiatry

## 2016-07-04 VITALS — BP 195/71 | HR 92 | Temp 98.6°F | Wt 122.6 lb

## 2016-07-04 DIAGNOSIS — F3160 Bipolar disorder, current episode mixed, unspecified: Secondary | ICD-10-CM | POA: Diagnosis not present

## 2016-07-04 DIAGNOSIS — F1210 Cannabis abuse, uncomplicated: Secondary | ICD-10-CM

## 2016-07-04 DIAGNOSIS — F1024 Alcohol dependence with alcohol-induced mood disorder: Secondary | ICD-10-CM | POA: Diagnosis not present

## 2016-07-04 MED ORDER — OXCARBAZEPINE 600 MG PO TABS: 600.0000 mg | ORAL_TABLET | Freq: Two times a day (BID) | ORAL | 1 refills | Status: DC

## 2016-07-04 MED ORDER — AMANTADINE HCL 100 MG PO CAPS: 100.0000 mg | ORAL_CAPSULE | Freq: Every day | ORAL | 1 refills | Status: DC

## 2016-07-04 MED ORDER — LITHIUM CARBONATE ER 450 MG PO TBCR
450.0000 mg | EXTENDED_RELEASE_TABLET | Freq: Every day | ORAL | 3 refills | Status: DC
Start: 2016-07-04 — End: 2016-12-22

## 2016-07-04 MED ORDER — QUETIAPINE FUMARATE 100 MG PO TABS: ORAL_TABLET | ORAL | 1 refills | Status: DC
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
## 2016-07-07 ENCOUNTER — Encounter: Payer: Self-pay | Admitting: Obstetrics and Gynecology

## 2016-07-07 ENCOUNTER — Ambulatory Visit (INDEPENDENT_AMBULATORY_CARE_PROVIDER_SITE_OTHER): Payer: Medicaid Other | Admitting: Obstetrics and Gynecology

## 2016-07-07 VITALS — BP 147/70 | HR 80 | Ht 66.0 in | Wt 126.9 lb

## 2016-07-07 DIAGNOSIS — N870 Mild cervical dysplasia: Secondary | ICD-10-CM | POA: Diagnosis not present

## 2016-07-07 NOTE — Addendum Note (Signed)
Addended by: Elouise Munroe on: 07/07/2016 04:31 PM   Modules accepted: Orders

## 2016-07-07 NOTE — Patient Instructions (Signed)
Colposcopy  Care After  Colposcopy is a procedure in which a special tool is used to magnify the surface of the cervix. A tissue sample (biopsy) may also be taken. This sample will be looked at for cervical cancer or other problems. After the test:  · You may have some cramping.  · Lie down for a few minutes if you feel lightheaded.  ·  You may have some bleeding which should stop in a few days.  HOME CARE  · Do not have sex or use tampons for 2 to 3 days or as told.  · Only take medicine as told by your doctor.  · Continue to take your birth control pills as usual.  Finding out the results of your test  Ask when your test results will be ready. Make sure you get your test results.  GET HELP RIGHT AWAY IF:  · You are bleeding a lot or are passing blood clots.  · You develop a fever of 102° F (38.9° C) or higher.  · You have abnormal vaginal discharge.  · You have cramps that do not go away with medicine.  · You feel lightheaded, dizzy, or pass out (faint).  MAKE SURE YOU:   · Understand these instructions.  · Will watch your condition.  · Will get help right away if you are not doing well or get worse.     This information is not intended to replace advice given to you by your health care provider. Make sure you discuss any questions you have with your health care provider.     Document Released: 02/29/2008 Document Revised: 12/05/2011 Document Reviewed: 04/11/2013  Elsevier Interactive Patient Education ©2016 Elsevier Inc.
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
## 2016-07-11 ENCOUNTER — Encounter: Payer: Self-pay | Admitting: Obstetrics and Gynecology

## 2016-07-12 LAB — PATHOLOGY

## 2016-07-15 ENCOUNTER — Other Ambulatory Visit: Payer: Self-pay | Admitting: Internal Medicine

## 2016-07-15 DIAGNOSIS — I639 Cerebral infarction, unspecified: Secondary | ICD-10-CM

## 2016-07-15 NOTE — Telephone Encounter (Signed)
Left vm for pt to return my call. I need to speak with her regarding repeat lab and [place]. No return call.

## 2016-07-19 LAB — PAP IG W/ RFLX HPV ASCU: PAP Smear Comment: 0

## 2016-07-19 LAB — HPV DNA PROBE HIGH RISK, AMPLIFIED: HPV, high-risk: POSITIVE — AB

## 2016-07-20 ENCOUNTER — Ambulatory Visit: Payer: Medicaid Other | Attending: Orthopedic Surgery | Admitting: Physical Therapy

## 2016-07-20 ENCOUNTER — Ambulatory Visit: Payer: Medicaid Other | Attending: Internal Medicine

## 2016-08-01 ENCOUNTER — Ambulatory Visit: Payer: Self-pay | Admitting: Psychiatry

## 2016-08-04 ENCOUNTER — Other Ambulatory Visit: Payer: Self-pay

## 2016-08-04 NOTE — Telephone Encounter (Signed)
Mailed letter requesting a call back to schedule repeat labs and [place].

## 2016-08-09 ENCOUNTER — Encounter: Payer: Self-pay | Admitting: Physical Therapy

## 2016-08-09 ENCOUNTER — Ambulatory Visit: Payer: Medicaid Other | Attending: Orthopedic Surgery | Admitting: Physical Therapy

## 2016-08-09 DIAGNOSIS — M5441 Lumbago with sciatica, right side: Secondary | ICD-10-CM | POA: Insufficient documentation

## 2016-08-09 DIAGNOSIS — M5442 Lumbago with sciatica, left side: Secondary | ICD-10-CM | POA: Diagnosis present

## 2016-08-09 DIAGNOSIS — R293 Abnormal posture: Secondary | ICD-10-CM | POA: Insufficient documentation
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
## 2016-08-10 ENCOUNTER — Encounter: Payer: Self-pay | Admitting: Physical Therapy

## 2016-08-12 ENCOUNTER — Other Ambulatory Visit: Payer: Self-pay | Admitting: Family Medicine

## 2016-08-20 ENCOUNTER — Other Ambulatory Visit: Payer: Self-pay | Admitting: Psychiatry

## 2016-08-25 ENCOUNTER — Telehealth: Payer: Self-pay | Admitting: *Deleted

## 2016-09-01 ENCOUNTER — Other Ambulatory Visit: Payer: Self-pay | Admitting: Psychiatry

## 2016-09-06 NOTE — Telephone Encounter (Signed)
called in refill on amantadine , lithium, and lithium.

## 2016-09-23 ENCOUNTER — Encounter: Payer: Self-pay | Admitting: Family Medicine

## 2016-09-23 ENCOUNTER — Ambulatory Visit (INDEPENDENT_AMBULATORY_CARE_PROVIDER_SITE_OTHER): Payer: Medicaid Other | Admitting: Family Medicine

## 2016-09-23 VITALS — BP 140/80 | HR 90 | Temp 98.0°F | Resp 14 | Wt 133.0 lb

## 2016-09-23 DIAGNOSIS — I2510 Atherosclerotic heart disease of native coronary artery without angina pectoris: Secondary | ICD-10-CM | POA: Diagnosis not present

## 2016-09-23 DIAGNOSIS — R739 Hyperglycemia, unspecified: Secondary | ICD-10-CM | POA: Diagnosis not present

## 2016-09-23 DIAGNOSIS — Z720 Tobacco use: Secondary | ICD-10-CM

## 2016-09-23 DIAGNOSIS — R413 Other amnesia: Secondary | ICD-10-CM | POA: Diagnosis not present

## 2016-09-23 DIAGNOSIS — D696 Thrombocytopenia, unspecified: Secondary | ICD-10-CM | POA: Insufficient documentation

## 2016-09-23 DIAGNOSIS — K739 Chronic hepatitis, unspecified: Secondary | ICD-10-CM

## 2016-09-23 DIAGNOSIS — Z23 Encounter for immunization: Secondary | ICD-10-CM

## 2016-09-23 LAB — COMPLETE METABOLIC PANEL WITH GFR
ALT: 19 U/L (ref 6–29)
AST: 21 U/L (ref 10–35)
Albumin: 4.3 g/dL (ref 3.6–5.1)
Alkaline Phosphatase: 70 U/L (ref 33–130)
BUN: 9 mg/dL (ref 7–25)
CO2: 24 mmol/L (ref 20–31)
Calcium: 9.8 mg/dL (ref 8.6–10.4)
Chloride: 106 mmol/L (ref 98–110)
Creat: 0.99 mg/dL (ref 0.50–0.99)
GFR, Est African American: 71 mL/min (ref >=60)
GFR, Est Non African American: 62 mL/min (ref >=60)
Glucose, Bld: 93 mg/dL (ref 65–99)
Potassium: 4.7 mmol/L (ref 3.5–5.3)
Sodium: 137 mmol/L (ref 135–146)
Total Bilirubin: 0.4 mg/dL (ref 0.2–1.2)
Total Protein: 8.4 g/dL — ABNORMAL HIGH (ref 6.1–8.1)

## 2016-09-23 LAB — CBC WITH DIFFERENTIAL/PLATELET
Basophils Absolute: 64 cells/uL (ref 0–200)
Basophils Relative: 1 %
Eosinophils Absolute: 192 cells/uL (ref 15–500)
Eosinophils Relative: 3 %
HCT: 47.8 % — ABNORMAL HIGH (ref 35.0–45.0)
Hemoglobin: 16 g/dL — ABNORMAL HIGH (ref 11.7–15.5)
Lymphocytes Relative: 30 %
Lymphs Abs: 1920 cells/uL (ref 850–3900)
MCH: 31.5 pg (ref 27.0–33.0)
MCHC: 33.5 g/dL (ref 32.0–36.0)
MCV: 94.1 fL (ref 80.0–100.0)
MPV: 10 fL (ref 7.5–12.5)
Monocytes Absolute: 256 cells/uL (ref 200–950)
Monocytes Relative: 4 %
Neutro Abs: 3968 cells/uL (ref 1500–7800)
Neutrophils Relative %: 62 %
Platelets: 144 10*3/uL (ref 140–400)
RBC: 5.08 MIL/uL (ref 3.80–5.10)
RDW: 13.2 % (ref 11.0–15.0)
WBC: 6.4 10*3/uL (ref 3.8–10.8)

## 2016-09-23 LAB — LIPID PANEL
Cholesterol: 194 mg/dL (ref <200)
HDL: 53 mg/dL (ref >50)
LDL Cholesterol: 92 mg/dL (ref <100)
Total CHOL/HDL Ratio: 3.7 Ratio (ref <5.0)
Triglycerides: 245 mg/dL — ABNORMAL HIGH (ref <150)
VLDL: 49 mg/dL — ABNORMAL HIGH (ref <30)

## 2016-09-23 MED ORDER — NICOTINE 21 MG/24HR TD PT24: 21.0000 mg | MEDICATED_PATCH | Freq: Every day | TRANSDERMAL | 1 refills | Status: DC

## 2016-09-23 MED ORDER — NICOTINE 14 MG/24HR TD PT24: 14.0000 mg | MEDICATED_PATCH | Freq: Every day | TRANSDERMAL | 0 refills | Status: DC

## 2016-09-23 MED ORDER — NICOTINE 7 MG/24HR TD PT24: 7.0000 mg | MEDICATED_PATCH | Freq: Every day | TRANSDERMAL | 0 refills | Status: DC

## 2016-09-23 NOTE — Assessment & Plan Note (Signed)
Refer back to neurologist; visual hallucinations concerning for possible Lewy Body dementia; referral entered

## 2016-09-23 NOTE — Assessment & Plan Note (Signed)
Check liver enzymes 

## 2016-09-23 NOTE — Patient Instructions (Addendum)
I do encourage you to quit smoking Call [phone] to sign up for smoking cessation classes You can call 1-800-QUIT-NOW to talk with a smoking cessation coach  I've put in the referral for you to see Dr. Melrose Nakayama again  Your goal blood pressure is less than 150 mmHg on top. Try to follow the DASH guidelines (DASH stands for Dietary Approaches to Stop Hypertension) Try to limit the sodium in your diet.  Ideally, consume less than 1.5 grams (less than 1,500mg ) per day. Do not add salt when cooking or at the table.  Check the sodium amount on labels when shopping, and choose items lower in sodium when given a choice. Avoid or limit foods that already contain a lot of sodium. Eat a diet rich in fruits and vegetables and whole grains.  Start with the highest dose nicotine patch (21 mg/day) for six weeks, followed by 14 mg/day for two weeks, and finish with 7 mg/day for two weeks.   Steps to Quit Smoking Smoking tobacco can be bad for your health. It can also affect almost every organ in your body. Smoking puts you and people around you at risk for many serious long-lasting (chronic) diseases. Quitting smoking is hard, but it is one of the best things that you can do for your health. It is never too late to quit. What are the benefits of quitting smoking? When you quit smoking, you lower your risk for getting serious diseases and conditions. They can include:  Lung cancer or lung disease.  Heart disease.  Stroke.  Heart attack.  Not being able to have children (infertility).  Weak bones (osteoporosis) and broken bones (fractures). If you have coughing, wheezing, and shortness of breath, those symptoms may get better when you quit. You may also get sick less often. If you are pregnant, quitting smoking can help to lower your chances of having a baby of low birth weight. What can I do to help me quit smoking? Talk with your doctor about what can help you quit smoking. Some things you can do  (strategies) include:  Quitting smoking totally, instead of slowly cutting back how much you smoke over a period of time.  Going to in-person counseling. You are more likely to quit if you go to many counseling sessions.  Using resources and support systems, such as:  Online chats with a Social worker.  Phone quitlines.  Printed Furniture conservator/restorer.  Support groups or group counseling.  Text messaging programs.  Mobile phone apps or applications.  Taking medicines. Some of these medicines may have nicotine in them. If you are pregnant or breastfeeding, do not take any medicines to quit smoking unless your doctor says it is okay. Talk with your doctor about counseling or other things that can help you. Talk with your doctor about using more than one strategy at the same time, such as taking medicines while you are also going to in-person counseling. This can help make quitting easier. What things can I do to make it easier to quit? Quitting smoking might feel very hard at first, but there is a lot that you can do to make it easier. Take these steps:  Talk to your family and friends. Ask them to support and encourage you.  Call phone quitlines, reach out to support groups, or work with a Social worker.  Ask people who smoke to not smoke around you.  Avoid places that make you want (trigger) to smoke, such as:  Bars.  Parties.  Smoke-break areas at  work.  Spend time with people who do not smoke.  Lower the stress in your life. Stress can make you want to smoke. Try these things to help your stress:  Getting regular exercise.  Deep-breathing exercises.  Yoga.  Meditating.  Doing a body scan. To do this, close your eyes, focus on one area of your body at a time from head to toe, and notice which parts of your body are tense. Try to relax the muscles in those areas.  Download or buy apps on your mobile phone or tablet that can help you stick to your quit plan. There are many  free apps, such as QuitGuide from the [employer] Office manager for Disease Control and Prevention). You can find more support from smokefree.gov and other websites. This information is not intended to replace advice given to you by your health care provider. Make sure you discuss any questions you have with your health care provider. Document Released: 07/09/2009 Document Revised: 05/10/2016 Document Reviewed: 01/27/2015 Elsevier Interactive Patient Education  2017 [employer].

## 2016-09-23 NOTE — Assessment & Plan Note (Addendum)
Discussed smoking cessation; break the habit; use nicotine taper system; see AVS; more than 3 minutes spent counseling patient on smoking cessation today
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
## 2016-09-23 NOTE — Assessment & Plan Note (Signed)
Check CBC 

## 2016-09-23 NOTE — Assessment & Plan Note (Signed)
Check lipids; urged pt to quit smoking; do not smoke while wearing patch; discussed risk of MI, s/s of MI, call 911; continue baby aspirin daily

## 2016-09-23 NOTE — Assessment & Plan Note (Signed)
Check a1c 

## 2016-09-24 ENCOUNTER — Other Ambulatory Visit: Payer: Self-pay | Admitting: Family Medicine

## 2016-09-24 DIAGNOSIS — R718 Other abnormality of red blood cells: Secondary | ICD-10-CM | POA: Insufficient documentation

## 2016-09-24 DIAGNOSIS — E8809 Other disorders of plasma-protein metabolism, not elsewhere classified: Secondary | ICD-10-CM

## 2016-09-24 DIAGNOSIS — R779 Abnormality of plasma protein, unspecified: Secondary | ICD-10-CM | POA: Insufficient documentation

## 2016-09-24 LAB — HEMOGLOBIN A1C
Hgb A1c MFr Bld: 4.8 % (ref <5.7)
Mean Plasma Glucose: 91 mg/dL

## 2016-09-24 NOTE — Assessment & Plan Note (Signed)
Recheck protein in 6 weeks; additional testing (UPEP, SPEP, etc if elevated)

## 2016-09-24 NOTE — Progress Notes (Signed)
Orders for labs in 6 weeks

## 2016-09-24 NOTE — Assessment & Plan Note (Signed)
Likely secondary to smoking; stop smoking, recheck labs in 6 weeks

## 2016-09-27 ENCOUNTER — Other Ambulatory Visit: Payer: Self-pay | Admitting: Psychiatry

## 2016-09-29 ENCOUNTER — Other Ambulatory Visit: Payer: Self-pay | Admitting: Psychiatry

## 2016-10-02 ENCOUNTER — Other Ambulatory Visit: Payer: Self-pay | Admitting: Psychiatry

## 2016-10-03 ENCOUNTER — Telehealth: Payer: Self-pay

## 2016-10-03 NOTE — Telephone Encounter (Signed)
left message on doctor's line that ok for refill on seroquel 100mg   with no additional refills. and pt will need to make appt to be seen before anymore refill can be given.

## 2016-10-07 ENCOUNTER — Telehealth: Payer: Self-pay

## 2016-10-07 NOTE — Telephone Encounter (Signed)
Patient husband state that they are not able to to travel to [facility] and would like something local. I sent in a referral request to [facility]. Notes and demographics were sent asking them to schedule and appointment.

## 2016-10-19 ENCOUNTER — Encounter: Payer: Medicaid Other | Admitting: Family Medicine

## 2016-10-24 ENCOUNTER — Encounter: Payer: Self-pay | Admitting: Family Medicine

## 2016-10-24 ENCOUNTER — Ambulatory Visit (INDEPENDENT_AMBULATORY_CARE_PROVIDER_SITE_OTHER): Payer: Medicaid Other | Admitting: Family Medicine

## 2016-10-24 VITALS — BP 148/78 | HR 94 | Temp 99.3°F | Resp 16 | Ht 65.1 in | Wt 129.0 lb

## 2016-10-24 DIAGNOSIS — Z87891 Personal history of nicotine dependence: Secondary | ICD-10-CM

## 2016-10-24 DIAGNOSIS — I10 Essential (primary) hypertension: Secondary | ICD-10-CM | POA: Diagnosis not present

## 2016-10-24 DIAGNOSIS — R87612 Low grade squamous intraepithelial lesion on cytologic smear of cervix (LGSIL): Secondary | ICD-10-CM

## 2016-10-24 DIAGNOSIS — B182 Chronic viral hepatitis C: Secondary | ICD-10-CM | POA: Diagnosis not present

## 2016-10-24 DIAGNOSIS — N6321 Unspecified lump in the left breast, upper outer quadrant: Secondary | ICD-10-CM | POA: Diagnosis not present

## 2016-10-24 DIAGNOSIS — Z0000 Encounter for general adult medical examination without abnormal findings: Secondary | ICD-10-CM

## 2016-10-24 DIAGNOSIS — D485 Neoplasm of uncertain behavior of skin: Secondary | ICD-10-CM | POA: Diagnosis not present

## 2016-10-24 DIAGNOSIS — N898 Other specified noninflammatory disorders of vagina: Secondary | ICD-10-CM | POA: Diagnosis not present

## 2016-10-24 DIAGNOSIS — Z13820 Encounter for screening for osteoporosis: Secondary | ICD-10-CM | POA: Diagnosis not present

## 2016-10-24 HISTORY — DX: Neoplasm of uncertain behavior of skin: D48.5

## 2016-10-24 MED ORDER — ECONAZOLE NITRATE 1 % EX CREA: TOPICAL_CREAM | Freq: Every day | CUTANEOUS | 0 refills | Status: DC

## 2016-10-24 NOTE — Assessment & Plan Note (Signed)
mammo and US ordered.
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
## 2016-10-24 NOTE — Assessment & Plan Note (Addendum)
Try DASH guidelines; not ideal; stress may affect pressures for her

## 2016-10-24 NOTE — Assessment & Plan Note (Signed)
Note sent to Dr. Allen Norris to see where patient stands with treatment

## 2016-10-24 NOTE — Patient Instructions (Addendum)
Try to follow the DASH guidelines (DASH stands for Dietary Approaches to Stop Hypertension) Try to limit the sodium in your diet.  Ideally, consume less than 1.5 grams (less than 1,551m) per day. Do not add salt when cooking or at the table.  Check the sodium amount on labels when shopping, and choose items lower in sodium when given a choice. Avoid or limit foods that already contain a lot of sodium. Eat a diet rich in fruits and vegetables and whole grains.  Please do go back to see Dr. DMelody Haverin April for your pap smear You'll be due for a repeat chest CT in February Please do call to schedule your bone density study; [facility] is [phone] I am so proud of you for quitting smoking!!   Health Maintenance, Female Introduction Adopting a healthy lifestyle and getting preventive care can go a long way to promote health and wellness. Talk with your health care provider about what schedule of regular examinations is right for you. This is a good chance for you to check in with your provider about disease prevention and staying healthy. In between checkups, there are plenty of things you can do on your own. Experts have done a lot of research about which lifestyle changes and preventive measures are most likely to keep you healthy. Ask your health care provider for more information. Weight and diet Eat a healthy diet  Be sure to include plenty of vegetables, fruits, low-fat dairy products, and lean protein.  Do not eat a lot of foods high in solid fats, added sugars, or salt.  Get regular exercise. This is one of the most important things you can do for your health.  Most adults should exercise for at least 150 minutes each week. The exercise should increase your heart rate and make you sweat (moderate-intensity exercise).  Most adults should also do strengthening exercises at least twice a week. This  is in addition to the moderate-intensity exercise. Maintain a healthy weight  Body mass index (BMI) is a measurement that can be used to identify possible weight problems. It estimates body fat based on height and weight. Your health care provider can help determine your BMI and help you achieve or maintain a healthy weight.  For females 229years of age and older:  A BMI below 18.5 is considered underweight.  A BMI of 18.5 to 24.9 is normal.  A BMI of 25 to 29.9 is considered overweight.  A BMI of 30 and above is considered obese. Watch levels of cholesterol and blood lipids  You should start having your blood tested for lipids and cholesterol at 62years of age, then have this test every 5 years.  You may need to have your cholesterol levels checked more often if:  Your lipid or cholesterol levels are high.  You are older than 62years of age.  You are at high risk for heart disease. Cancer screening Lung Cancer  Lung cancer screening is recommended for adults 528850years old who are at high risk for lung cancer because of a history of smoking.  A yearly low-dose CT scan of the lungs is recommended for people who:  Currently smoke.  Have quit within the past 15 years.  Have at least a 30-pack-year history of smoking. A pack year is smoking an average of one pack of cigarettes a day for 1 year.  Yearly screening should continue  until it has been 15 years since you quit.  Yearly screening should stop if you develop a health problem that would prevent you from having lung cancer treatment. Breast Cancer  Practice breast self-awareness. This means understanding how your breasts normally appear and feel.  It also means doing regular breast self-exams. Let your health care provider know about any changes, no matter how small.  If you are in your 20s or 30s, you should have a clinical breast exam (CBE) by a health care provider every 1-3 years as part of a regular health  exam.  If you are 61 or older, have a CBE every year. Also consider having a breast X-ray (mammogram) every year.  If you have a family history of breast cancer, talk to your health care provider about genetic screening.  If you are at high risk for breast cancer, talk to your health care provider about having an MRI and a mammogram every year.  Breast cancer gene (BRCA) assessment is recommended for women who have family members with BRCA-related cancers. BRCA-related cancers include:  Breast.  Ovarian.  Tubal.  Peritoneal cancers.  Results of the assessment will determine the need for genetic counseling and BRCA1 and BRCA2 testing. Cervical Cancer  Your health care provider may recommend that you be screened regularly for cancer of the pelvic organs (ovaries, uterus, and vagina). This screening involves a pelvic examination, including checking for microscopic changes to the surface of your cervix (Pap test). You may be encouraged to have this screening done every 3 years, beginning at age 72.  For women ages 56-65, health care providers may recommend pelvic exams and Pap testing every 3 years, or they may recommend the Pap and pelvic exam, combined with testing for human papilloma virus (HPV), every 5 years. Some types of HPV increase your risk of cervical cancer. Testing for HPV may also be done on women of any age with unclear Pap test results.  Other health care providers may not recommend any screening for nonpregnant women who are considered low risk for pelvic cancer and who do not have symptoms. Ask your health care provider if a screening pelvic exam is right for you.  If you have had past treatment for cervical cancer or a condition that could lead to cancer, you need Pap tests and screening for cancer for at least 20 years after your treatment. If Pap tests have been discontinued, your risk factors (such as having a new sexual partner) need to be reassessed to determine if  screening should resume. Some women have medical problems that increase the chance of getting cervical cancer. In these cases, your health care provider may recommend more frequent screening and Pap tests. Colorectal Cancer  This type of cancer can be detected and often prevented.  Routine colorectal cancer screening usually begins at 62 years of age and continues through 62 years of age.  Your health care provider may recommend screening at an earlier age if you have risk factors for colon cancer.  Your health care provider may also recommend using home test kits to check for hidden blood in the stool.  A small camera at the end of a tube can be used to examine your colon directly (sigmoidoscopy or colonoscopy). This is done to check for the earliest forms of colorectal cancer.  Routine screening usually begins at age 51.  Direct examination of the colon should be repeated every 5-10 years through 62 years of age. However, you may need to be  screened more often if early forms of precancerous polyps or small growths are found. Skin Cancer  Check your skin from head to toe regularly.  Tell your health care provider about any new moles or changes in moles, especially if there is a change in a mole's shape or color.  Also tell your health care provider if you have a mole that is larger than the size of a pencil eraser.  Always use sunscreen. Apply sunscreen liberally and repeatedly throughout the day.  Protect yourself by wearing long sleeves, pants, a wide-brimmed hat, and sunglasses whenever you are outside. Heart disease, diabetes, and high blood pressure  High blood pressure causes heart disease and increases the risk of stroke. High blood pressure is more likely to develop in:  People who have blood pressure in the high end of the normal range (130-139/85-89 mm Hg).  People who are overweight or obese.  People who are African American.  If you are 57-23 years of age, have your  blood pressure checked every 3-5 years. If you are 14 years of age or older, have your blood pressure checked every year. You should have your blood pressure measured twice-once when you are at a hospital or clinic, and once when you are not at a hospital or clinic. Record the average of the two measurements. To check your blood pressure when you are not at a hospital or clinic, you can use:  An automated blood pressure machine at a pharmacy.  A home blood pressure monitor.  If you are between 73 years and 53 years old, ask your health care provider if you should take aspirin to prevent strokes.  Have regular diabetes screenings. This involves taking a blood sample to check your fasting blood sugar level.  If you are at a normal weight and have a low risk for diabetes, have this test once every three years after 62 years of age.  If you are overweight and have a high risk for diabetes, consider being tested at a younger age or more often. Preventing infection Hepatitis B  If you have a higher risk for hepatitis B, you should be screened for this virus. You are considered at high risk for hepatitis B if:  You were born in a country where hepatitis B is common. Ask your health care provider which countries are considered high risk.  Your parents were born in a high-risk country, and you have not been immunized against hepatitis B (hepatitis B vaccine).  You have HIV or AIDS.  You use needles to inject street drugs.  You live with someone who has hepatitis B.  You have had sex with someone who has hepatitis B.  You get hemodialysis treatment.  You take certain medicines for conditions, including cancer, organ transplantation, and autoimmune conditions. Hepatitis C  Blood testing is recommended for:  Everyone born from [birth year] through 1966.  Anyone with known risk factors for hepatitis C. Sexually transmitted infections (STIs)  You should be screened for sexually transmitted  infections (STIs) including gonorrhea and chlamydia if:  You are sexually active and are younger than 62 years of age.  You are older than 62 years of age and your health care provider tells you that you are at risk for this type of infection.  Your sexual activity has changed since you were last screened and you are at an increased risk for chlamydia or gonorrhea. Ask your health care provider if you are at risk.  If you do not have  HIV, but are at risk, it may be recommended that you take a prescription medicine daily to prevent HIV infection. This is called pre-exposure prophylaxis (PrEP). You are considered at risk if:  You are sexually active and do not regularly use condoms or know the HIV status of your partner(s).  You take drugs by injection.  You are sexually active with a partner who has HIV. Talk with your health care provider about whether you are at high risk of being infected with HIV. If you choose to begin PrEP, you should first be tested for HIV. You should then be tested every 3 months for as long as you are taking PrEP. Pregnancy  If you are premenopausal and you may become pregnant, ask your health care provider about preconception counseling.  If you may become pregnant, take 400 to 800 micrograms (mcg) of folic acid every day.  If you want to prevent pregnancy, talk to your health care provider about birth control (contraception). Osteoporosis and menopause  Osteoporosis is a disease in which the bones lose minerals and strength with aging. This can result in serious bone fractures. Your risk for osteoporosis can be identified using a bone density scan.  If you are 76 years of age or older, or if you are at risk for osteoporosis and fractures, ask your health care provider if you should be screened.  Ask your health care provider whether you should take a calcium or vitamin D supplement to lower your risk for osteoporosis.  Menopause may have certain physical  symptoms and risks.  Hormone replacement therapy may reduce some of these symptoms and risks. Talk to your health care provider about whether hormone replacement therapy is right for you. Follow these instructions at home:  Schedule regular health, dental, and eye exams.  Stay current with your immunizations.  Do not use any tobacco products including cigarettes, chewing tobacco, or electronic cigarettes.  If you are pregnant, do not drink alcohol.  If you are breastfeeding, limit how much and how often you drink alcohol.  Limit alcohol intake to no more than 1 drink per day for nonpregnant women. One drink equals 12 ounces of beer, 5 ounces of wine, or 1 ounces of hard liquor.  Do not use street drugs.  Do not share needles.  Ask your health care provider for help if you need support or information about quitting drugs.  Tell your health care provider if you often feel depressed.  Tell your health care provider if you have ever been abused or do not feel safe at home. This information is not intended to replace advice given to you by your health care provider. Make sure you discuss any questions you have with your health care provider. Document Released: 03/28/2011 Document Revised: 02/18/2016 Document Reviewed: 06/16/2015  2017 Elsevier

## 2016-10-24 NOTE — Assessment & Plan Note (Signed)
Left temple; suspicious for early [facility]

## 2016-10-24 NOTE — Assessment & Plan Note (Signed)
Chest CT 

## 2016-10-25 LAB — WET PREP BY MOLECULAR PROBE
Candida species: NEGATIVE
Gardnerella vaginalis: POSITIVE — AB
Trichomonas vaginosis: POSITIVE — AB

## 2016-10-26 ENCOUNTER — Telehealth: Payer: Self-pay | Admitting: *Deleted

## 2016-10-26 NOTE — Telephone Encounter (Signed)
Left voicemail for patient notifyng them that it is time to schedule annual low dose lung cancer screening CT scan. Instructed patient to call back to verify information prior to the scan being scheduled.  

## 2016-10-27 ENCOUNTER — Other Ambulatory Visit: Payer: Self-pay | Admitting: Psychiatry

## 2016-10-27 ENCOUNTER — Telehealth: Payer: Self-pay | Admitting: *Deleted

## 2016-10-27 DIAGNOSIS — Z87891 Personal history of nicotine dependence: Secondary | ICD-10-CM

## 2016-10-27 NOTE — Telephone Encounter (Signed)
Notified patient that annual lung cancer screening low dose CT scan is due. Confirmed that patient is within the age range of 55-77, and asymptomatic, (no signs or symptoms of lung cancer). Patient denies illness that would prevent curative treatment for lung cancer if found. The patient is a former smoker, quit 09/26/16, with a 42 pack year history. The shared decision making visit was done 11/06/15. Patient is agreeable for CT scan being scheduled.

## 2016-10-31 ENCOUNTER — Other Ambulatory Visit: Payer: Self-pay | Admitting: Family Medicine

## 2016-10-31 MED ORDER — METRONIDAZOLE 500 MG PO TABS: 500.0000 mg | ORAL_TABLET | Freq: Two times a day (BID) | ORAL | 0 refills | Status: AC

## 2016-10-31 NOTE — Progress Notes (Signed)
I called, left voicemail; would like her to call me tomorrow to go over results; there is a new medicine I want to start for her, can be picked up; please call me tomorrow

## 2016-11-06 NOTE — Assessment & Plan Note (Signed)
Managed by GYN

## 2016-11-06 NOTE — Assessment & Plan Note (Signed)
USPSTF grade A and B recommendations reviewed with patient; age-appropriate recommendations, preventive care, screening tests, etc discussed and encouraged; healthy living encouraged; see AVS for patient education given to patient  

## 2016-11-06 NOTE — Assessment & Plan Note (Signed)
I am so happy that she has quit smoking; praise given, encouragement to not pick back up

## 2016-11-07 ENCOUNTER — Ambulatory Visit: Payer: No Typology Code available for payment source | Admitting: Psychiatry

## 2016-11-14 ENCOUNTER — Ambulatory Visit
Admission: RE | Admit: 2016-11-14 | Discharge: 2016-11-14 | Disposition: A | Discharge status: Home / Self Care | Payer: Medicaid Other | Source: Ambulatory Visit | Attending: Oncology | Admitting: Oncology

## 2016-11-14 DIAGNOSIS — I700 Atherosclerosis of aorta: Secondary | ICD-10-CM | POA: Diagnosis not present

## 2016-11-14 DIAGNOSIS — Z87891 Personal history of nicotine dependence: Secondary | ICD-10-CM | POA: Diagnosis present

## 2016-11-14 DIAGNOSIS — Z87820 Personal history of traumatic brain injury: Secondary | ICD-10-CM | POA: Insufficient documentation

## 2016-11-14 DIAGNOSIS — D3502 Benign neoplasm of left adrenal gland: Secondary | ICD-10-CM | POA: Insufficient documentation

## 2016-11-14 DIAGNOSIS — R29898 Other symptoms and signs involving the musculoskeletal system: Secondary | ICD-10-CM | POA: Insufficient documentation

## 2016-11-14 DIAGNOSIS — I2510 Atherosclerotic heart disease of native coronary artery without angina pectoris: Secondary | ICD-10-CM | POA: Diagnosis not present

## 2016-11-17 ENCOUNTER — Other Ambulatory Visit: Payer: Self-pay | Admitting: Family Medicine

## 2016-11-17 ENCOUNTER — Telehealth: Payer: Self-pay | Admitting: Family Medicine

## 2016-11-17 MED ORDER — CICLOPIROX OLAMINE 0.77 % EX CREA: TOPICAL_CREAM | Freq: Two times a day (BID) | CUTANEOUS | 0 refills | Status: DC

## 2016-11-17 NOTE — Telephone Encounter (Signed)
Patient never called me back about the abnormal wet prep I'll contact her this afternoon

## 2016-11-17 NOTE — Progress Notes (Signed)
New Rx for rash

## 2016-11-18 NOTE — Telephone Encounter (Signed)
Husband called back states received a call yesterday about meds for her infection.  He states she took the antibiotic but her medicaid denied the cream?

## 2016-11-22 ENCOUNTER — Other Ambulatory Visit: Payer: Self-pay | Admitting: Psychiatry

## 2016-11-23 ENCOUNTER — Other Ambulatory Visit: Payer: Self-pay | Admitting: Neurology

## 2016-11-23 DIAGNOSIS — R413 Other amnesia: Secondary | ICD-10-CM

## 2016-11-25 ENCOUNTER — Encounter: Payer: Self-pay | Admitting: *Deleted

## 2016-11-25 ENCOUNTER — Other Ambulatory Visit: Payer: Self-pay | Admitting: Psychiatry

## 2016-11-25 MED ORDER — AMANTADINE HCL 100 MG PO CAPS: 100.0000 mg | ORAL_CAPSULE | Freq: Every day | ORAL | 1 refills | Status: DC

## 2016-11-25 MED ORDER — QUETIAPINE FUMARATE 100 MG PO TABS: ORAL_TABLET | ORAL | 1 refills | Status: DC

## 2016-11-25 MED ORDER — METRONIDAZOLE 500 MG PO TABS: 500.0000 mg | ORAL_TABLET | Freq: Two times a day (BID) | ORAL | 0 refills | Status: DC

## 2016-11-25 NOTE — Telephone Encounter (Signed)
Discussed with husband; need for both of them to be treated

## 2016-12-22 ENCOUNTER — Ambulatory Visit (INDEPENDENT_AMBULATORY_CARE_PROVIDER_SITE_OTHER): Payer: Medicaid Other | Admitting: Family Medicine

## 2016-12-22 ENCOUNTER — Encounter: Payer: Self-pay | Admitting: Family Medicine

## 2016-12-22 VITALS — BP 146/78 | HR 91 | Temp 98.3°F | Resp 16 | Wt 131.6 lb

## 2016-12-22 DIAGNOSIS — E8809 Other disorders of plasma-protein metabolism, not elsewhere classified: Secondary | ICD-10-CM | POA: Diagnosis not present

## 2016-12-22 DIAGNOSIS — R718 Other abnormality of red blood cells: Secondary | ICD-10-CM | POA: Diagnosis not present

## 2016-12-22 DIAGNOSIS — Z720 Tobacco use: Secondary | ICD-10-CM | POA: Diagnosis not present

## 2016-12-22 DIAGNOSIS — I10 Essential (primary) hypertension: Secondary | ICD-10-CM | POA: Diagnosis not present

## 2016-12-22 DIAGNOSIS — A5900 Urogenital trichomoniasis, unspecified: Secondary | ICD-10-CM | POA: Diagnosis not present

## 2016-12-22 DIAGNOSIS — J449 Chronic obstructive pulmonary disease, unspecified: Secondary | ICD-10-CM

## 2016-12-22 DIAGNOSIS — R779 Abnormality of plasma protein, unspecified: Secondary | ICD-10-CM

## 2016-12-22 LAB — CBC WITH DIFFERENTIAL/PLATELET
Basophils Absolute: 0 cells/uL (ref 0–200)
Basophils Relative: 0 %
Eosinophils Absolute: 94 cells/uL (ref 15–500)
Eosinophils Relative: 1 %
HCT: 48.2 % — ABNORMAL HIGH (ref 35.0–45.0)
Hemoglobin: 16.4 g/dL — ABNORMAL HIGH (ref 11.7–15.5)
Lymphocytes Relative: 14 %
Lymphs Abs: 1316 cells/uL (ref 850–3900)
MCH: 32.2 pg (ref 27.0–33.0)
MCHC: 34 g/dL (ref 32.0–36.0)
MCV: 94.7 fL (ref 80.0–100.0)
MPV: 9.6 fL (ref 7.5–12.5)
Monocytes Absolute: 0 cells/uL — ABNORMAL LOW (ref 200–950)
Monocytes Relative: 0 %
Neutro Abs: 7990 cells/uL — ABNORMAL HIGH (ref 1500–7800)
Neutrophils Relative %: 85 %
Platelets: 155 10*3/uL (ref 140–400)
RBC: 5.09 MIL/uL (ref 3.80–5.10)
RDW: 13.3 % (ref 11.0–15.0)
WBC: 9.4 10*3/uL (ref 3.8–10.8)

## 2016-12-22 LAB — COMPLETE METABOLIC PANEL WITH GFR
ALT: 23 U/L (ref 6–29)
AST: 26 U/L (ref 10–35)
Albumin: 4.5 g/dL (ref 3.6–5.1)
Alkaline Phosphatase: 72 U/L (ref 33–130)
BUN: 14 mg/dL (ref 7–25)
CO2: 18 mmol/L — ABNORMAL LOW (ref 20–31)
Calcium: 10.4 mg/dL (ref 8.6–10.4)
Chloride: 104 mmol/L (ref 98–110)
Creat: 0.9 mg/dL (ref 0.50–0.99)
GFR, Est African American: 79 mL/min (ref >=60)
GFR, Est Non African American: 69 mL/min (ref >=60)
Glucose, Bld: 91 mg/dL (ref 65–99)
Potassium: 4 mmol/L (ref 3.5–5.3)
Sodium: 137 mmol/L (ref 135–146)
Total Bilirubin: 0.8 mg/dL (ref 0.2–1.2)
Total Protein: 9 g/dL — ABNORMAL HIGH (ref 6.1–8.1)

## 2016-12-22 NOTE — Assessment & Plan Note (Signed)
Fair control, based on which target is used; goal at least under 150, even better would be under 093 systolic; avoid salt

## 2016-12-22 NOTE — Assessment & Plan Note (Signed)
Check labs 

## 2016-12-22 NOTE — Assessment & Plan Note (Signed)
Seeing pulmonologist; urged her to quit smoking; praise given for quitting, citing that she was able to do it for 1.5 months

## 2016-12-22 NOTE — Patient Instructions (Addendum)
We'll get urine and blood tests I do encourage you to quit smoking Call [phone] to sign up for smoking cessation classes You can call 1-800-QUIT-NOW to talk with a smoking cessation coach YOU CAN DO IT!!  Steps to Quit Smoking Smoking tobacco can be bad for your health. It can also affect almost every organ in your body. Smoking puts you and people around you at risk for many serious long-lasting (chronic) diseases. Quitting smoking is hard, but it is one of the best things that you can do for your health. It is never too late to quit. What are the benefits of quitting smoking? When you quit smoking, you lower your risk for getting serious diseases and conditions. They can include:  Lung cancer or lung disease.  Heart disease.  Stroke.  Heart attack.  Not being able to have children (infertility).  Weak bones (osteoporosis) and broken bones (fractures). If you have coughing, wheezing, and shortness of breath, those symptoms may get better when you quit. You may also get sick less often. If you are pregnant, quitting smoking can help to lower your chances of having a baby of low birth weight. What can I do to help me quit smoking? Talk with your doctor about what can help you quit smoking. Some things you can do (strategies) include:  Quitting smoking totally, instead of slowly cutting back how much you smoke over a period of time.  Going to in-person counseling. You are more likely to quit if you go to many counseling sessions.  Using resources and support systems, such as:  Online chats with a Social worker.  Phone quitlines.  Printed Furniture conservator/restorer.  Support groups or group counseling.  Text messaging programs.  Mobile phone apps or applications.  Taking medicines. Some of these medicines may have nicotine in them. If you are pregnant or breastfeeding, do not take any medicines to quit smoking unless your doctor says it is okay. Talk with your doctor about  counseling or other things that can help you. Talk with your doctor about using more than one strategy at the same time, such as taking medicines while you are also going to in-person counseling. This can help make quitting easier. What things can I do to make it easier to quit? Quitting smoking might feel very hard at first, but there is a lot that you can do to make it easier. Take these steps:  Talk to your family and friends. Ask them to support and encourage you.  Call phone quitlines, reach out to support groups, or work with a Social worker.  Ask people who smoke to not smoke around you.  Avoid places that make you want (trigger) to smoke, such as:  Bars.  Parties.  Smoke-break areas at work.  Spend time with people who do not smoke.  Lower the stress in your life. Stress can make you want to smoke. Try these things to help your stress:  Getting regular exercise.  Deep-breathing exercises.  Yoga.  Meditating.  Doing a body scan. To do this, close your eyes, focus on one area of your body at a time from head to toe, and notice which parts of your body are tense. Try to relax the muscles in those areas.  Download or buy apps on your mobile phone or tablet that can help you stick to your quit plan. There are many free apps, such as QuitGuide from the [employer] Office manager for Disease Control and Prevention). You can find more support from smokefree.gov and  other websites. This information is not intended to replace advice given to you by your health care provider. Make sure you discuss any questions you have with your health care provider. Document Released: 07/09/2009 Document Revised: 05/10/2016 Document Reviewed: 01/27/2015 Elsevier Interactive Patient Education  2017 [employer].
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
## 2016-12-22 NOTE — Assessment & Plan Note (Signed)
Discussed dx, smoking

## 2016-12-23 LAB — SURESWAB, T.VAGINALIS RNA,QL,FEMALE: Trichomonas vaginalis RNA: NOT DETECTED

## 2016-12-25 ENCOUNTER — Emergency Department
Admission: EM | Admit: 2016-12-25 | Discharge: 2016-12-25 | Disposition: A | Discharge status: Home / Self Care | Payer: Medicaid Other | Attending: Emergency Medicine | Admitting: Emergency Medicine

## 2016-12-25 ENCOUNTER — Encounter: Payer: Self-pay | Admitting: Emergency Medicine

## 2016-12-25 ENCOUNTER — Emergency Department: Payer: Medicaid Other

## 2016-12-25 DIAGNOSIS — Z85828 Personal history of other malignant neoplasm of skin: Secondary | ICD-10-CM | POA: Insufficient documentation

## 2016-12-25 DIAGNOSIS — T148XXA Other injury of unspecified body region, initial encounter: Secondary | ICD-10-CM

## 2016-12-25 DIAGNOSIS — X500XXA Overexertion from strenuous movement or load, initial encounter: Secondary | ICD-10-CM | POA: Diagnosis not present

## 2016-12-25 DIAGNOSIS — Y999 Unspecified external cause status: Secondary | ICD-10-CM | POA: Insufficient documentation

## 2016-12-25 DIAGNOSIS — Z7982 Long term (current) use of aspirin: Secondary | ICD-10-CM | POA: Insufficient documentation

## 2016-12-25 DIAGNOSIS — Z79899 Other long term (current) drug therapy: Secondary | ICD-10-CM | POA: Diagnosis not present

## 2016-12-25 DIAGNOSIS — S76011A Strain of muscle, fascia and tendon of right hip, initial encounter: Secondary | ICD-10-CM | POA: Insufficient documentation

## 2016-12-25 DIAGNOSIS — Y929 Unspecified place or not applicable: Secondary | ICD-10-CM | POA: Insufficient documentation

## 2016-12-25 DIAGNOSIS — F17210 Nicotine dependence, cigarettes, uncomplicated: Secondary | ICD-10-CM | POA: Diagnosis not present

## 2016-12-25 DIAGNOSIS — Y9389 Activity, other specified: Secondary | ICD-10-CM | POA: Insufficient documentation

## 2016-12-25 DIAGNOSIS — J449 Chronic obstructive pulmonary disease, unspecified: Secondary | ICD-10-CM | POA: Diagnosis not present

## 2016-12-25 DIAGNOSIS — I10 Essential (primary) hypertension: Secondary | ICD-10-CM | POA: Diagnosis not present

## 2016-12-25 DIAGNOSIS — S79911A Unspecified injury of right hip, initial encounter: Secondary | ICD-10-CM | POA: Diagnosis present

## 2016-12-25 DIAGNOSIS — M25551 Pain in right hip: Secondary | ICD-10-CM

## 2016-12-25 MED ORDER — KETOROLAC TROMETHAMINE 30 MG/ML IJ SOLN
30.0000 mg | Freq: Once | INTRAMUSCULAR | Status: AC
  Administered 2016-12-25: 30 mg via INTRAMUSCULAR
  Filled 2016-12-25: qty 1

## 2016-12-25 NOTE — ED Triage Notes (Signed)
C/O right hip pain x 1 week. Denies injury.

## 2016-12-25 NOTE — ED Notes (Signed)
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
## 2016-12-25 NOTE — ED Notes (Signed)
See triage note   States she is having pain to right hip area for the past week  Denies any injury but has been doing some leg exercises   States pain goes from groin into post hip area  And then radiates down her leg  Per husband she is not very mobile on a regular basis

## 2016-12-25 NOTE — Discharge Instructions (Signed)
Continue taking meloxicam once a day as directed. Follow-up with your primary care doctor if any continued problems. Apply ice packs to your hip as needed for comfort.

## 2017-01-04 ENCOUNTER — Other Ambulatory Visit: Payer: Self-pay | Admitting: Family Medicine

## 2017-01-04 DIAGNOSIS — R779 Abnormality of plasma protein, unspecified: Secondary | ICD-10-CM

## 2017-01-04 DIAGNOSIS — E8809 Other disorders of plasma-protein metabolism, not elsewhere classified: Secondary | ICD-10-CM

## 2017-01-04 NOTE — Progress Notes (Signed)
Labs ordered.

## 2017-01-04 NOTE — Assessment & Plan Note (Signed)
Check additional labs

## 2017-01-05 ENCOUNTER — Encounter: Payer: Self-pay | Admitting: Obstetrics and Gynecology

## 2017-01-06 DIAGNOSIS — G5601 Carpal tunnel syndrome, right upper limb: Secondary | ICD-10-CM | POA: Insufficient documentation

## 2017-01-06 HISTORY — DX: Carpal tunnel syndrome, right upper limb: G56.01

## 2017-01-17 ENCOUNTER — Other Ambulatory Visit: Payer: Self-pay | Admitting: Psychiatry

## 2017-01-18 ENCOUNTER — Other Ambulatory Visit: Payer: Self-pay | Admitting: Psychiatry

## 2017-01-19 ENCOUNTER — Telehealth: Payer: Self-pay | Admitting: Family Medicine
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
## 2017-01-19 NOTE — Telephone Encounter (Signed)
Husband requesting refill on 2 medications that are filled by Dr Sanda Klein (he could not remember the name of them). Asking that you send to [facility]

## 2017-01-19 NOTE — Telephone Encounter (Signed)
Looked over pt chart and saw that only Amlodipine and Asprin are filed by Dr. lada. Asprin of course pt can get [facility]. The pt has Amlodipine ready for her to pick up and once remaining  Refill. Called the pt husband to get a better understand on what other Med's Dr. lada Prescribed. No answer; Left voicemail.

## 2017-01-20 NOTE — Telephone Encounter (Signed)
Please check with Dr.Faheem

## 2017-01-24 ENCOUNTER — Telehealth: Payer: Self-pay

## 2017-01-24 NOTE — Telephone Encounter (Signed)
received a fax requesting refills on amantadine and lithium pt was last seen on  07-04-16 and next appt is  01-30-17

## 2017-01-30 ENCOUNTER — Ambulatory Visit (INDEPENDENT_AMBULATORY_CARE_PROVIDER_SITE_OTHER): Payer: Medicaid Other | Admitting: Psychiatry

## 2017-01-30 ENCOUNTER — Encounter: Payer: Self-pay | Admitting: Psychiatry

## 2017-01-30 VITALS — BP 172/77 | HR 89 | Temp 97.9°F | Wt 129.2 lb

## 2017-01-30 DIAGNOSIS — F1210 Cannabis abuse, uncomplicated: Secondary | ICD-10-CM

## 2017-01-30 DIAGNOSIS — F3160 Bipolar disorder, current episode mixed, unspecified: Secondary | ICD-10-CM | POA: Diagnosis not present

## 2017-01-30 MED ORDER — AMANTADINE HCL 100 MG PO CAPS: 100.0000 mg | ORAL_CAPSULE | Freq: Every day | ORAL | 1 refills | Status: DC

## 2017-01-30 MED ORDER — GABAPENTIN 300 MG PO CAPS: 300.0000 mg | ORAL_CAPSULE | Freq: Every day | ORAL | 1 refills | Status: DC

## 2017-01-30 MED ORDER — LITHIUM CARBONATE 150 MG PO CAPS: 150.0000 mg | ORAL_CAPSULE | Freq: Two times a day (BID) | ORAL | 1 refills | Status: DC

## 2017-01-30 MED ORDER — QUETIAPINE FUMARATE 100 MG PO TABS: ORAL_TABLET | ORAL | 1 refills | Status: DC

## 2017-01-30 MED ORDER — OXCARBAZEPINE 600 MG PO TABS
600.0000 mg | ORAL_TABLET | Freq: Two times a day (BID) | ORAL | 1 refills | Status: DC
Start: 2017-01-30 — End: 2017-05-01

## 2017-01-30 NOTE — Telephone Encounter (Signed)
Pt was seen today in the office
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
## 2017-02-10 NOTE — Telephone Encounter (Signed)
Pt was seen on  01-30-17 and was given the rx with one additional refills

## 2017-02-11 ENCOUNTER — Other Ambulatory Visit: Payer: Self-pay | Admitting: Psychiatry

## 2017-02-21 ENCOUNTER — Other Ambulatory Visit: Payer: Self-pay | Admitting: Psychiatry

## 2017-03-06 ENCOUNTER — Telehealth: Payer: Self-pay

## 2017-03-06 NOTE — Telephone Encounter (Signed)
pt husband called back states that he needs refill pt husband said that medication have not been working so he has been given pt a whole pill in the morining and a whole pill in the afternoon.  pt was advised to go to er and that before anyrefill dr. Gretel Acre would need to see patient for any medication changes.    Pt husband was told that there was no doctor in the office this week and that he would have to go to er and that message would be sent to dr. Gretel Acre to let her know when she comes into office on the 18th.

## 2017-03-06 NOTE — Telephone Encounter (Signed)
called and left a message that they would not be able to get refills that they should have enough to last until july 7th. pt picked up within a week time period. per the pharmacy they picked up rx on 5-7th and then again on  5-14th. He was advised to go to the er for any refills because there were no doctors in the office this week. And that a message would be sent to dr. Gretel Acre for when she comes back on the the 18th.

## 2017-03-06 NOTE — Telephone Encounter (Signed)
i called the [facility] spoke with pharmaist. they states that pt already received refill.  pt picked up 1st time on  5-7th and then again on the 14th.  I asked how that they could have received the rx within a week and she stated that there medicaid system is down and that it did not flag that it was early.

## 2017-03-06 NOTE — Telephone Encounter (Signed)
pt husband called states his wife is out of seroquel. pt  should not be out of medication because dr. Gretel Acre sent in a rx for seroquel 100mg  on 01-30-17 for #45 with one refill. pt should have enough to do until july 7th.  i will call pharmacy

## 2017-03-14 ENCOUNTER — Other Ambulatory Visit: Payer: Self-pay | Admitting: Psychiatry

## 2017-03-14 ENCOUNTER — Other Ambulatory Visit: Payer: Self-pay

## 2017-03-14 MED ORDER — AMLODIPINE BESYLATE 10 MG PO TABS: 10.0000 mg | ORAL_TABLET | Freq: Every day | ORAL | 5 refills | Status: DC

## 2017-03-27 ENCOUNTER — Telehealth: Payer: Self-pay | Admitting: Family Medicine

## 2017-03-27 NOTE — Telephone Encounter (Signed)
Left vociemail...

## 2017-03-27 NOTE — Telephone Encounter (Signed)
Pt would like a call back about her medication.

## 2017-03-30 ENCOUNTER — Ambulatory Visit (INDEPENDENT_AMBULATORY_CARE_PROVIDER_SITE_OTHER): Payer: Medicaid Other | Admitting: Family Medicine

## 2017-03-30 ENCOUNTER — Encounter: Payer: Self-pay | Admitting: Family Medicine

## 2017-03-30 DIAGNOSIS — I10 Essential (primary) hypertension: Secondary | ICD-10-CM

## 2017-03-30 DIAGNOSIS — I2510 Atherosclerotic heart disease of native coronary artery without angina pectoris: Secondary | ICD-10-CM

## 2017-03-30 DIAGNOSIS — F3170 Bipolar disorder, currently in remission, most recent episode unspecified: Secondary | ICD-10-CM | POA: Diagnosis not present

## 2017-03-30 DIAGNOSIS — E8809 Other disorders of plasma-protein metabolism, not elsewhere classified: Secondary | ICD-10-CM

## 2017-03-30 DIAGNOSIS — D696 Thrombocytopenia, unspecified: Secondary | ICD-10-CM

## 2017-03-30 DIAGNOSIS — R779 Abnormality of plasma protein, unspecified: Secondary | ICD-10-CM

## 2017-03-30 DIAGNOSIS — R739 Hyperglycemia, unspecified: Secondary | ICD-10-CM | POA: Diagnosis not present

## 2017-03-30 DIAGNOSIS — J449 Chronic obstructive pulmonary disease, unspecified: Secondary | ICD-10-CM | POA: Diagnosis not present

## 2017-03-30 DIAGNOSIS — B182 Chronic viral hepatitis C: Secondary | ICD-10-CM | POA: Diagnosis not present

## 2017-03-30 DIAGNOSIS — R718 Other abnormality of red blood cells: Secondary | ICD-10-CM

## 2017-03-30 LAB — COMPLETE METABOLIC PANEL WITH GFR
ALT: 18 U/L (ref 6–29)
AST: 23 U/L (ref 10–35)
Albumin: 4.1 g/dL (ref 3.6–5.1)
Alkaline Phosphatase: 72 U/L (ref 33–130)
BUN: 10 mg/dL (ref 7–25)
CO2: 21 mmol/L (ref 20–31)
Calcium: 9.5 mg/dL (ref 8.6–10.4)
Chloride: 104 mmol/L (ref 98–110)
Creat: 0.86 mg/dL (ref 0.50–0.99)
GFR, Est African American: 84 mL/min (ref >=60)
GFR, Est Non African American: 73 mL/min (ref >=60)
Glucose, Bld: 130 mg/dL — ABNORMAL HIGH (ref 65–99)
Potassium: 3.9 mmol/L (ref 3.5–5.3)
Sodium: 135 mmol/L (ref 135–146)
Total Bilirubin: 0.5 mg/dL (ref 0.2–1.2)
Total Protein: 7.8 g/dL (ref 6.1–8.1)

## 2017-03-30 LAB — CBC WITH DIFFERENTIAL/PLATELET
Basophils Absolute: 79 cells/uL (ref 0–200)
Basophils Relative: 1 %
Eosinophils Absolute: 158 cells/uL (ref 15–500)
Eosinophils Relative: 2 %
HCT: 45.7 % — ABNORMAL HIGH (ref 35.0–45.0)
Hemoglobin: 15.5 g/dL (ref 11.7–15.5)
Lymphocytes Relative: 31 %
Lymphs Abs: 2449 cells/uL (ref 850–3900)
MCH: 31.4 pg (ref 27.0–33.0)
MCHC: 33.9 g/dL (ref 32.0–36.0)
MCV: 92.5 fL (ref 80.0–100.0)
MPV: 9.8 fL (ref 7.5–12.5)
Monocytes Absolute: 395 cells/uL (ref 200–950)
Monocytes Relative: 5 %
Neutro Abs: 4819 cells/uL (ref 1500–7800)
Neutrophils Relative %: 61 %
Platelets: 169 10*3/uL (ref 140–400)
RBC: 4.94 MIL/uL (ref 3.80–5.10)
RDW: 13.7 % (ref 11.0–15.0)
WBC: 7.9 10*3/uL (ref 3.8–10.8)

## 2017-03-30 LAB — LIPID PANEL
Cholesterol: 149 mg/dL (ref <200)
HDL: 48 mg/dL — ABNORMAL LOW (ref >50)
LDL Cholesterol: 76 mg/dL (ref <100)
Total CHOL/HDL Ratio: 3.1 Ratio (ref <5.0)
Triglycerides: 125 mg/dL (ref <150)
VLDL: 25 mg/dL (ref <30)

## 2017-03-30 MED ORDER — ALBUTEROL SULFATE HFA 108 (90 BASE) MCG/ACT IN AERS: 2.0000 | INHALATION_SPRAY | Freq: Four times a day (QID) | RESPIRATORY_TRACT | 1 refills | Status: DC | PRN

## 2017-03-30 MED ORDER — IPRATROPIUM-ALBUTEROL 20-100 MCG/ACT IN AERS: 1.0000 | INHALATION_SPRAY | Freq: Four times a day (QID) | RESPIRATORY_TRACT | 2 refills | Status: DC | PRN

## 2017-03-30 MED ORDER — QUETIAPINE FUMARATE 100 MG PO TABS: ORAL_TABLET | ORAL | 0 refills | Status: DC

## 2017-03-30 MED ORDER — AMANTADINE HCL 100 MG PO CAPS: 100.0000 mg | ORAL_CAPSULE | Freq: Every day | ORAL | 0 refills | Status: DC

## 2017-03-30 NOTE — Assessment & Plan Note (Signed)
May be secondary to smoking, but may also be OSA, other hematologic condition

## 2017-03-30 NOTE — Patient Instructions (Addendum)
Please do have the breast imaging done We'll have you see the psychiatrist and pulmonologist We'll contact you about your labs If you have not heard anything from my staff in a week about any orders/referrals/studies from today, please contact us here to follow-up [phone]    Arnetha Courser, MD PCP - General Family Medicine 01/12/2016 End  01/12/16  Phone: [phone]; Fax: [phone]    Other Patient Care Team Members  Defrancesco, Alanda Slim, MD Consulting Physician Obstetrics and Gynecology 11/02/2015 End  11/02/15  Phone: [phone]; Fax: [phone]    Lucilla Lame, [place] Physician Gastroenterology 11/02/2015 End  11/02/15  Phone: [phone]; Fax: [phone]    Wilhelmina Mcardle, MD Consulting Physician Pulmonary Disease 11/02/2015 End  11/02/15  Phone: [phone]; Fax: [phone]    Yolonda Kida, MD Consulting Physician Cardiology 04/12/2016 End  04/12/16  Phone: [phone]; Fax: [phone]    Anabel Bene, MD Referring Physician Neurology 06/24/2016 End  06/24/16  Phone: [phone]; Fax: [phone]      Steps to Quit Smoking Smoking tobacco can be bad for your health. It can also affect almost every organ in your body. Smoking puts you and people around you at risk for many serious long-lasting (chronic) diseases. Quitting smoking is hard, but it is one of the best things that you can do for your health. It is never too late to quit. What are the benefits of quitting smoking? When you quit smoking, you lower your risk for getting serious diseases and conditions. They can include:  Lung cancer or lung disease.  Heart disease.  Stroke.  Heart attack.  Not being able to have children (infertility).  Weak bones (osteoporosis) and broken bones (fractures).  If you have coughing, wheezing, and shortness of breath, those symptoms may get better when you quit. You may also get sick less often. If you are pregnant, quitting smoking can  help to lower your chances of having a baby of low birth weight. What can I do to help me quit smoking? Talk with your doctor about what can help you quit smoking. Some things you can do (strategies) include:  Quitting smoking totally, instead of slowly cutting back how much you smoke over a period of time.  Going to in-person counseling. You are more likely to quit if you go to many counseling sessions.  Using resources and support systems, such as: ? Database administrator with a Social worker. ? Phone quitlines. ? Careers information officer. ? Support groups or group counseling. ? Text messaging programs. ? Mobile phone apps or applications.  Taking medicines. Some of these medicines may have nicotine in them. If you are pregnant or breastfeeding, do not take any medicines to quit smoking unless your doctor says it is okay. Talk with your doctor about counseling or other things that can help you.  Talk with your doctor about using more than one strategy at the same time, such as taking medicines while you are also going to in-person counseling. This can help make quitting easier. What things can I do to make it easier to quit? Quitting smoking might feel very hard at first, but there is a lot that you can do to make it easier. Take these steps:  Talk to your family and friends. Ask them to support and encourage you.  Call phone quitlines, reach out to support groups, or work with a Social worker.  Ask people who smoke to not smoke around you.  Avoid places that make you want (trigger) to smoke,  such as: ? Bars. ? Parties. ? Smoke-break areas at work.  Spend time with people who do not smoke.  Lower the stress in your life. Stress can make you want to smoke. Try these things to help your stress: ? Getting regular exercise. ? Deep-breathing exercises. ? Yoga. ? Meditating. ? Doing a body scan. To do this, close your eyes, focus on one area of your body at a time from head to toe, and notice  which parts of your body are tense. Try to relax the muscles in those areas.  Download or buy apps on your mobile phone or tablet that can help you stick to your quit plan. There are many free apps, such as QuitGuide from the [employer] Office manager for Disease Control and Prevention). You can find more support from smokefree.gov and other websites.  This information is not intended to replace advice given to you by your health care provider. Make sure you discuss any questions you have with your health care provider. Document Released: 07/09/2009 Document Revised: 05/10/2016 Document Reviewed: 01/27/2015 Elsevier Interactive Patient Education  2018 [employer].

## 2017-03-30 NOTE — Assessment & Plan Note (Signed)
Seeing Dr. Clayborn Bigness; taking aspirin and beta-blocker
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
## 2017-03-30 NOTE — Assessment & Plan Note (Signed)
Refilled med at request; refer back to Dr. Leonidas Romberg; stressed need for cessation, encouraged her to quit smoking; pointed out risk of leukemia

## 2017-03-30 NOTE — Assessment & Plan Note (Signed)
Note was sent to GI earlier; will check hepatic function panel along with hepatitis C quant RNA

## 2017-03-30 NOTE — Assessment & Plan Note (Signed)
Monitor glucose and A1c

## 2017-03-30 NOTE — Assessment & Plan Note (Signed)
Check CBC 

## 2017-03-30 NOTE — Assessment & Plan Note (Signed)
Check SPEP, UPEP, IFE

## 2017-03-30 NOTE — Assessment & Plan Note (Signed)
Refer to new psychiatrist; availability problems; refilled the medicines needed; new referral entered; contracted for safety

## 2017-03-30 NOTE — Assessment & Plan Note (Signed)
Not quite to goal today

## 2017-03-31 LAB — HEMOGLOBIN A1C
Hgb A1c MFr Bld: 4.9 % (ref <5.7)
Mean Plasma Glucose: 94 mg/dL

## 2017-04-03 LAB — PROTEIN ELECTROPHORESIS, SERUM
Albumin ELP: 4 g/dL (ref 3.8–4.8)
Alpha-1-Globulin: 0.3 g/dL (ref 0.2–0.3)
Alpha-2-Globulin: 0.9 g/dL (ref 0.5–0.9)
Beta 2: 0.2 g/dL (ref 0.2–0.5)
Beta Globulin: 0.5 g/dL (ref 0.4–0.6)
Gamma Globulin: 1.9 g/dL — ABNORMAL HIGH (ref 0.8–1.7)
Total Protein, Serum Electrophoresis: 7.8 g/dL (ref 6.1–8.1)

## 2017-04-03 LAB — PROTEIN ELECTROPHORESIS,RANDOM URN
Albumin: 71.5 %
Alpha-1-Globulin, U: 10.5 %
Alpha-2-Globulin, U: 6.2 %
Beta Globulin, U: 7 %
Creatinine, Urine: 176 mg/dL (ref 20–320)
Gamma Globulin, U: 4.8 %
Protein Creatinine Ratio: 1006 mg/g creat — ABNORMAL HIGH (ref 21–161)
Total Protein, Urine: 177 mg/dL — ABNORMAL HIGH (ref 5–24)

## 2017-04-03 LAB — IMMUNOFIXATION ELECTROPHORESIS
IgA: 87 mg/dL (ref 81–463)
IgG (Immunoglobin G), Serum: 2120 mg/dL — ABNORMAL HIGH (ref 694–1618)
IgM, Serum: 78 mg/dL (ref 48–271)

## 2017-04-04 LAB — HEPATITIS C RNA QUANTITATIVE
HCV Quantitative Log: 6.99 Log IU/mL — ABNORMAL HIGH
HCV Quantitative: 9850000 IU/mL — ABNORMAL HIGH

## 2017-04-05 ENCOUNTER — Ambulatory Visit: Payer: Medicaid Other | Admitting: Psychiatry

## 2017-04-06 ENCOUNTER — Encounter: Payer: Self-pay | Admitting: Family Medicine

## 2017-04-10 ENCOUNTER — Other Ambulatory Visit: Payer: Self-pay | Admitting: Family Medicine

## 2017-04-10 DIAGNOSIS — B182 Chronic viral hepatitis C: Secondary | ICD-10-CM

## 2017-04-10 NOTE — Progress Notes (Signed)
The phone number in [place] is invalid; I cannot reach her I sent MyChart message to patient; we need to get her in to see liver specialist to treat her hep C

## 2017-04-14 ENCOUNTER — Other Ambulatory Visit: Payer: Self-pay | Admitting: Family Medicine

## 2017-04-17 ENCOUNTER — Telehealth: Payer: Self-pay

## 2017-04-17 DIAGNOSIS — F3160 Bipolar disorder, current episode mixed, unspecified: Secondary | ICD-10-CM

## 2017-04-17 MED ORDER — LITHIUM CARBONATE 150 MG PO CAPS: 150.0000 mg | ORAL_CAPSULE | Freq: Two times a day (BID) | ORAL | 0 refills | Status: DC

## 2017-04-17 NOTE — Telephone Encounter (Signed)
I prescribed one month of the amantadine and seroquel on 03/30/17; please resolve with pharmacy

## 2017-04-17 NOTE — Telephone Encounter (Signed)
Medication refill request - Fax refill request received for patient's Lithium Carbonate as patient rescheduled from 04/05/17.  Met with Dr. Gretel Acre who approved a one time refill of the medication.  A new Lithium 150 mg, one twice a day with meal, #60 order plus 0 refills e-scribed to patient's [facility] pharmacy as approved by Dr. Gretel Acre this date.

## 2017-04-18 ENCOUNTER — Other Ambulatory Visit: Payer: Self-pay | Admitting: Family Medicine

## 2017-04-18 ENCOUNTER — Other Ambulatory Visit: Payer: Self-pay | Admitting: Psychiatry

## 2017-04-18 DIAGNOSIS — F3160 Bipolar disorder, current episode mixed, unspecified: Secondary | ICD-10-CM

## 2017-04-18 NOTE — Telephone Encounter (Signed)
I approved one month each of amantadine (Symmetrel) and quietiapine (Seroquel) on July 5th Receipt confirmed by pharmacy at 3:02 pm on 03/30/17 for both of these meds I'm denying refill requests Too soon to get these again Patient should be getting in to see new psychiatrist very soon; I entered referral 03/30/17; please make sure that is in the works Thank you

## 2017-05-01 ENCOUNTER — Ambulatory Visit (INDEPENDENT_AMBULATORY_CARE_PROVIDER_SITE_OTHER): Payer: Medicaid Other | Admitting: Psychiatry

## 2017-05-01 ENCOUNTER — Encounter: Payer: Self-pay | Admitting: Psychiatry

## 2017-05-01 VITALS — BP 147/76 | HR 92 | Temp 98.6°F | Wt 126.6 lb

## 2017-05-01 DIAGNOSIS — F1210 Cannabis abuse, uncomplicated: Secondary | ICD-10-CM | POA: Diagnosis not present

## 2017-05-01 DIAGNOSIS — F3160 Bipolar disorder, current episode mixed, unspecified: Secondary | ICD-10-CM | POA: Diagnosis not present

## 2017-05-01 MED ORDER — GABAPENTIN 300 MG PO CAPS: 300.0000 mg | ORAL_CAPSULE | Freq: Every day | ORAL | 2 refills | Status: DC

## 2017-05-01 MED ORDER — OXCARBAZEPINE 600 MG PO TABS: 600.0000 mg | ORAL_TABLET | Freq: Every morning | ORAL | 2 refills | Status: DC

## 2017-05-01 MED ORDER — LITHIUM CARBONATE 300 MG PO CAPS: 300.0000 mg | ORAL_CAPSULE | Freq: Two times a day (BID) | ORAL | 2 refills | Status: DC

## 2017-05-01 MED ORDER — AMANTADINE HCL 100 MG PO CAPS: 100.0000 mg | ORAL_CAPSULE | Freq: Every day | ORAL | 2 refills | Status: DC

## 2017-05-01 MED ORDER — QUETIAPINE FUMARATE 50 MG PO TABS: ORAL_TABLET | ORAL | 2 refills | Status: DC
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
## 2017-05-12 ENCOUNTER — Ambulatory Visit
Admission: RE | Admit: 2017-05-12 | Discharge: 2017-05-12 | Disposition: A | Discharge status: Home / Self Care | Payer: Medicaid Other | Source: Ambulatory Visit | Attending: Family Medicine | Admitting: Family Medicine

## 2017-05-12 ENCOUNTER — Ambulatory Visit: Admission: RE | Admit: 2017-05-12 | Payer: Medicaid Other | Source: Ambulatory Visit

## 2017-05-12 DIAGNOSIS — N6321 Unspecified lump in the left breast, upper outer quadrant: Secondary | ICD-10-CM | POA: Diagnosis present

## 2017-05-12 LAB — HM MAMMOGRAPHY

## 2017-05-12 NOTE — Progress Notes (Signed)
PULMONARY OFFICE NOTE Primary MD: Enid Derry Date of initial consultation: 10/22/14  Problems: smoker, COPD, dyspnea, CAD  LDCT 11/06/15:  no suspicious lung lesions but extensive coronary calcifications noted. 11/19/15 PFT: mild obstruction, mild decrease in DLCO. Marginal improvement after BD challenge 11/19/15 CXR: NACPD  INTERVAL HISTORY: Had stopped smoking for 4 months, but then restarted.   SUBJ: Remains markedly limited by DOE. Still smoking 1 ppd. Also reports chest tightness. Needs refills of her inhaler meds. She quit 4 mo with chantix, but now has restarted.    She has combivent respimat, her husband is present and gives some of the history. She uses combivent respimat 2 or 3 times per day. She uses proair once or twice per day as a rescue inhaler.    Vitals:   05/15/17 1409  BP: (!) 150/88  Pulse: 97  Resp: 16  SpO2: 99%  Weight: 128 lb (58.1 kg)  Height: 5\' 5"  (1.651 m)    EXAM:  Gen: NAD HEENT: WNL Lungs: breath sounds diminished, no wheezes Cardiovascular: Reg, no murmurs noted Abdomen: Soft, nontender, normal BS Ext: without clubbing, cyanosis, edema Neuro: grossly intact  DATA:   BMP Latest Ref Rng & Units 03/30/2017 12/22/2016 09/23/2016  Glucose 65 - 99 mg/dL 130(H) 91 93  BUN 7 - 25 mg/dL 10 14 9   Creatinine 0.50 - 0.99 mg/dL 0.86 0.90 0.99  BUN/Creat Ratio 11 - 26 - - -  Sodium 135 - 146 mmol/L 135 137 137  Potassium 3.5 - 5.3 mmol/L 3.9 4.0 4.7  Chloride 98 - 110 mmol/L 104 104 106  CO2 20 - 31 mmol/L 21 18(L) 24  Calcium 8.6 - 10.4 mg/dL 9.5 10.4 9.8    CBC Latest Ref Rng & Units 03/30/2017 12/22/2016 09/23/2016  WBC 3.8 - 10.8 K/uL 7.9 9.4 6.4  Hemoglobin 11.7 - 15.5 g/dL 15.5 16.4(H) 16.0(H)  Hematocrit 35.0 - 45.0 % 45.7(H) 48.2(H) 47.8(H)  Platelets 140 - 400 K/uL 169 155 144    CXR:  NNF  IMPRESSION:   Chronic obstructive pulmonary disease - mild by PFTs.  Severe DOE - in part due to deconditioning Recalcitrant smoker. Spent  greater than 3 minutes in discussion today regarding smoking cessation  PLAN:  Counseled again re: smoking cessation greater than 3 minutes. Changed Combivent Respimat to Bevespi Respimat, 2 puffs twice a day Continue pro-air 2 puffs when necessary Discussed increasing activity level ROV 6 mos   Deep Ashby Dawes, MD PCCM service Pager [phone] 05/15/2017

## 2017-05-15 ENCOUNTER — Encounter: Payer: Self-pay | Admitting: Internal Medicine

## 2017-05-15 ENCOUNTER — Ambulatory Visit (INDEPENDENT_AMBULATORY_CARE_PROVIDER_SITE_OTHER): Payer: Medicaid Other | Admitting: Internal Medicine

## 2017-05-15 VITALS — BP 150/88 | HR 97 | Resp 16 | Ht 65.0 in | Wt 128.0 lb

## 2017-05-15 DIAGNOSIS — J449 Chronic obstructive pulmonary disease, unspecified: Secondary | ICD-10-CM

## 2017-05-15 DIAGNOSIS — G4719 Other hypersomnia: Secondary | ICD-10-CM

## 2017-05-15 MED ORDER — VARENICLINE TARTRATE 0.5 MG X 11 & 1 MG X 42 PO MISC: ORAL | 0 refills | Status: DC

## 2017-05-15 MED ORDER — GLYCOPYRROLATE-FORMOTEROL 9-4.8 MCG/ACT IN AERO: 2.0000 | INHALATION_SPRAY | Freq: Two times a day (BID) | RESPIRATORY_TRACT | 11 refills | Status: DC

## 2017-05-15 NOTE — Patient Instructions (Signed)
--  Stop combivent, and start bevespi 2 puffs twice daily.   --Quitting smoking is the most important thing that you can do for your health.  --Quitting smoking will have greater affect on your health than any medicine that we can give you.

## 2017-06-13 ENCOUNTER — Encounter: Payer: Self-pay | Admitting: Gastroenterology

## 2017-06-13 ENCOUNTER — Ambulatory Visit (INDEPENDENT_AMBULATORY_CARE_PROVIDER_SITE_OTHER): Payer: Medicaid Other | Admitting: Gastroenterology

## 2017-06-13 VITALS — BP 140/67 | HR 73 | Temp 98.3°F | Ht 65.0 in | Wt 130.0 lb

## 2017-06-13 DIAGNOSIS — B182 Chronic viral hepatitis C: Secondary | ICD-10-CM | POA: Diagnosis not present

## 2017-06-13 NOTE — Progress Notes (Signed)
Primary Care Physician: Arnetha Courser, MD  Primary Gastroenterologist:  Dr. Lucilla Lame  Chief Complaint  Patient presents with  . Hepatitis C    HPI: Cynthia [name] is a 61 y.o. female here For follow-up of her hepatitis C.  The patient was seen for her hepatitis C in the past but never return phone calls to start her treatment.  The patient now comes with her husband and would like to start treatment.  The patient also reports that she had not followed up because her her psych medications were not being filled then she was off of them for some time.  The patient is now here to be evaluated for re-starting her treatment for her hepatitis C.  Current Outpatient Prescriptions  Medication Sig Dispense Refill  . albuterol (PROAIR HFA) 108 (90 Base) MCG/ACT inhaler Inhale 2 puffs into the lungs every 6 (six) hours as needed for wheezing or shortness of breath. 1 Inhaler 1  . amantadine (SYMMETREL) 100 MG capsule Take 1 capsule (100 mg total) by mouth daily. 30 capsule 2  . amLODipine (NORVASC) 10 MG tablet Take 1 tablet (10 mg total) by mouth daily. 30 tablet 5  . aspirin EC 81 MG tablet Take 1 tablet (81 mg total) by mouth daily.    . cholecalciferol (VITAMIN D) 1000 units tablet Take 1,000 Units by mouth daily.    Marland Kitchen gabapentin (NEURONTIN) 300 MG capsule Take 1 capsule (300 mg total) by mouth daily. 30 capsule 2  . Glycopyrrolate-Formoterol (BEVESPI AEROSPHERE) 9-4.8 MCG/ACT AERO Inhale 2 puffs into the lungs 2 (two) times daily. 1 Inhaler 11  . Ipratropium-Albuterol (COMBIVENT RESPIMAT) 20-100 MCG/ACT AERS respimat Inhale 1 puff into the lungs every 6 (six) hours as needed for wheezing. 1 Inhaler 2  . lithium carbonate 300 MG capsule Take 1 capsule (300 mg total) by mouth 2 (two) times daily with a meal. 60 capsule 2  . metoprolol tartrate (LOPRESSOR) 25 MG tablet Take 25 mg by mouth 2 (two) times daily.    Marland Kitchen oxcarbazepine (TRILEPTAL) 600 MG tablet Take 1 tablet (600 mg total) by mouth  every morning. 30 tablet 2  . QUEtiapine (SEROQUEL) 50 MG tablet 12 pm, 6 pm and 9 pm 90 tablet 2  . thiamine (VITAMIN B-1) 100 MG tablet Take 100 mg by mouth daily.     No current facility-administered medications for this visit.     Allergies as of 06/13/2017 - Review Complete 06/13/2017  Allergen Reaction Noted  . Morphine Anaphylaxis 09/17/2015    ROS:  General: Negative for anorexia, weight loss, fever, chills, fatigue, weakness. ENT: Negative for hoarseness, difficulty swallowing , nasal congestion. CV: Negative for chest pain, angina, palpitations, dyspnea on exertion, peripheral edema.  Respiratory: Negative for dyspnea at rest, dyspnea on exertion, cough, sputum, wheezing.  GI: See history of present illness. GU:  Negative for dysuria, hematuria, urinary incontinence, urinary frequency, nocturnal urination.  Endo: Negative for unusual weight change.    Physical Examination:   BP 140/67   Pulse 73   Temp 98.3 F (36.8 C) (Oral)   Ht 5\' 5"  (1.651 m)   Wt 130 lb (59 kg)   BMI 21.63 kg/m   General: Well-nourished, well-developed in no acute distress.  Eyes: No icterus. Conjunctivae pink. Mouth: Oropharyngeal mucosa moist and pink , no lesions erythema or exudate. Lungs: Clear to auscultation bilaterally. Non-labored. Heart: Regular rate and rhythm, no murmurs rubs or gallops.  Abdomen: Bowel sounds are normal, nontender, nondistended, no hepatosplenomegaly  or masses, no abdominal bruits or hernia , no rebound or guarding.   Extremities: No lower extremity edema. No clubbing or deformities. Neuro: Alert and oriented x 3.  Grossly intact. Skin: Warm and dry, no jaundice.   Psych: Alert and cooperative, normal mood and affect.  Labs:    Imaging Studies: No results found.  Assessment and Plan:   Cynthia [name] is a 61 y.o. y/o female Who has hepatitis C and is now ready for treatment.  The patient will have any remaining labs that need to be checked prior to  starting treatment recheck.  The patient has been informed the importance of taking upper treatment and sphincter medication with [place] to complete the treatment.  The patient will be started on treatment once all of her labs are back.  The patient and her husband have been explained the plan and agree with it.    Lucilla Lame, MD. Marval Regal   Note: This dictation was prepared with Dragon dictation along with smaller phrase technology. Any transcriptional errors that result from this process are unintentional.

## 2017-06-19 ENCOUNTER — Ambulatory Visit: Payer: Medicaid Other | Admitting: Psychiatry

## 2017-06-28 ENCOUNTER — Telehealth: Payer: Self-pay | Admitting: Gastroenterology

## 2017-06-28 MED ORDER — GLECAPREVIR-PIBRENTASVIR 100-40 MG PO TABS: 1.0000 | ORAL_TABLET | Freq: Every day | ORAL | 2 refills | Status: DC

## 2017-06-28 NOTE — Telephone Encounter (Signed)
Left vm for pt's husband to return my call.

## 2017-06-28 NOTE — Telephone Encounter (Signed)
Pt's husband, [name] has returned my call and advised [facility] has contacted him to schedule delivery of Hep C medication.

## 2017-06-28 NOTE — Telephone Encounter (Signed)
[name] (spouse) called and they haven't sent her hepatitis medication. Please call as they are wondering what is taking so long.

## 2017-06-28 NOTE — Telephone Encounter (Signed)
Please call patient when this has been done.

## 2017-06-30 ENCOUNTER — Ambulatory Visit: Payer: Self-pay | Admitting: Family Medicine

## 2017-07-11 ENCOUNTER — Ambulatory Visit (INDEPENDENT_AMBULATORY_CARE_PROVIDER_SITE_OTHER): Payer: Medicaid Other | Admitting: Family Medicine

## 2017-07-11 ENCOUNTER — Encounter: Payer: Self-pay | Admitting: Family Medicine

## 2017-07-11 ENCOUNTER — Emergency Department: Payer: Medicaid Other

## 2017-07-11 ENCOUNTER — Emergency Department
Admission: EM | Admit: 2017-07-11 | Discharge: 2017-07-11 | Disposition: A | Discharge status: Home / Self Care | Payer: Medicaid Other | Attending: Emergency Medicine | Admitting: Emergency Medicine

## 2017-07-11 VITALS — BP 136/70 | HR 87 | Temp 98.1°F | Resp 16 | Wt 126.2 lb

## 2017-07-11 DIAGNOSIS — R569 Unspecified convulsions: Secondary | ICD-10-CM

## 2017-07-11 DIAGNOSIS — J449 Chronic obstructive pulmonary disease, unspecified: Secondary | ICD-10-CM | POA: Insufficient documentation

## 2017-07-11 DIAGNOSIS — F17210 Nicotine dependence, cigarettes, uncomplicated: Secondary | ICD-10-CM | POA: Diagnosis not present

## 2017-07-11 DIAGNOSIS — I10 Essential (primary) hypertension: Secondary | ICD-10-CM | POA: Diagnosis not present

## 2017-07-11 DIAGNOSIS — R079 Chest pain, unspecified: Secondary | ICD-10-CM | POA: Diagnosis not present

## 2017-07-11 DIAGNOSIS — I2510 Atherosclerotic heart disease of native coronary artery without angina pectoris: Secondary | ICD-10-CM | POA: Diagnosis not present

## 2017-07-11 DIAGNOSIS — B182 Chronic viral hepatitis C: Secondary | ICD-10-CM

## 2017-07-11 DIAGNOSIS — Z7982 Long term (current) use of aspirin: Secondary | ICD-10-CM | POA: Insufficient documentation

## 2017-07-11 DIAGNOSIS — Z950 Presence of cardiac pacemaker: Secondary | ICD-10-CM

## 2017-07-11 DIAGNOSIS — Z79899 Other long term (current) drug therapy: Secondary | ICD-10-CM | POA: Diagnosis not present

## 2017-07-11 DIAGNOSIS — Z23 Encounter for immunization: Secondary | ICD-10-CM

## 2017-07-11 DIAGNOSIS — S069X5S Unspecified intracranial injury with loss of consciousness greater than 24 hours with return to pre-existing conscious level, sequela: Secondary | ICD-10-CM

## 2017-07-11 DIAGNOSIS — R739 Hyperglycemia, unspecified: Secondary | ICD-10-CM

## 2017-07-11 DIAGNOSIS — I200 Unstable angina: Secondary | ICD-10-CM

## 2017-07-11 LAB — CBC
HCT: 50 % — ABNORMAL HIGH (ref 35.0–47.0)
Hemoglobin: 17.2 g/dL — ABNORMAL HIGH (ref 12.0–16.0)
MCH: 31.8 pg (ref 26.0–34.0)
MCHC: 34.5 g/dL (ref 32.0–36.0)
MCV: 92.2 fL (ref 80.0–100.0)
Platelets: 188 10*3/uL (ref 150–440)
RBC: 5.42 MIL/uL — ABNORMAL HIGH (ref 3.80–5.20)
RDW: 13.4 % (ref 11.5–14.5)
WBC: 9.6 10*3/uL (ref 3.6–11.0)

## 2017-07-11 LAB — BASIC METABOLIC PANEL
Anion gap: 9 (ref 5–15)
BUN: 20 mg/dL (ref 6–20)
CO2: 24 mmol/L (ref 22–32)
Calcium: 10.2 mg/dL (ref 8.9–10.3)
Chloride: 103 mmol/L (ref 101–111)
Creatinine, Ser: 0.97 mg/dL (ref 0.44–1.00)
GFR calc Af Amer: >60 mL/min (ref >60)
GFR calc non Af Amer: >60 mL/min (ref >60)
Glucose, Bld: 122 mg/dL — ABNORMAL HIGH (ref 65–99)
Potassium: 3.9 mmol/L (ref 3.5–5.1)
Sodium: 136 mmol/L (ref 135–145)

## 2017-07-11 LAB — TROPONIN I
Troponin I: <0.03 ng/mL (ref <0.03)
Troponin I: <0.03 ng/mL (ref <0.03)

## 2017-07-11 MED ORDER — NITROGLYCERIN 0.4 MG SL SUBL: 0.4000 mg | SUBLINGUAL_TABLET | SUBLINGUAL | 1 refills | Status: DC | PRN

## 2017-07-11 MED ORDER — ASPIRIN 81 MG PO CHEW
324.0000 mg | CHEWABLE_TABLET | Freq: Once | ORAL | Status: AC
  Administered 2017-07-11: 324 mg via ORAL
  Filled 2017-07-11: qty 4

## 2017-07-11 MED ORDER — ASPIRIN 81 MG PO CHEW
324.0000 mg | CHEWABLE_TABLET | Freq: Once | ORAL | Status: DC
Start: 2017-07-11 — End: 2017-07-11

## 2017-07-11 NOTE — Progress Notes (Signed)
Pt's husband stopped [place] and asked him to visit pt. Roy met pt and husband, Sires, at bedside. Pt talked about her health challenges, her marriage, and her spiritual needs. Husband talked about his wife and stated she is most important person in his life. Per pt' request, [place] offered prayers and pastoral presence.      07/11/17 1800  Clinical Encounter Type  Visited With Patient and family together  Visit Type Initial;ED  Referral From Family  Consult/Referral To Chaplain  Spiritual Encounters  Spiritual Needs Prayer;Other (Comment)

## 2017-07-11 NOTE — ED Notes (Signed)
Patient transported to X-ray 

## 2017-07-11 NOTE — Assessment & Plan Note (Signed)
No recent seizures; seeing Dr. Melrose Nakayama

## 2017-07-11 NOTE — Assessment & Plan Note (Signed)
Last A1c was excellent 

## 2017-07-11 NOTE — Assessment & Plan Note (Signed)
Follow-up with neurologist. 

## 2017-07-11 NOTE — Assessment & Plan Note (Signed)
Abnormal EKG; reviewed with patient and her husband; patient refused 911, EMS transport; husband took patient directly to the ER; copies of EKG in hand; check-out given to [facility] in the ER, explained patient coming by private vehicle but has ischemia on EKG and needs to be seen immediately upon arrival; report given to Dr. Etta Quill staff as well, Arsenio Katz

## 2017-07-11 NOTE — Assessment & Plan Note (Signed)
Seeing lung doctor; not interested in quitting her cigarettes right now

## 2017-07-11 NOTE — ED Notes (Signed)
Patient states "I'm not having any pain nowhere"

## 2017-07-11 NOTE — Discharge Instructions (Signed)
Call Dr. Etta Quill office tomorrow morning to schedule appointment for this week. Return to the Emergency Department immediately for new or worsening chest pain, change in the pattern orf the chest pain, worsening difficulty breathing, or any other new or worsening symptoms that concern you.
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
## 2017-07-11 NOTE — ED Notes (Signed)
ST depressions 04/11/17, neg troponins, emphysema,

## 2017-07-11 NOTE — Assessment & Plan Note (Addendum)
Discussed the increased risk of heart attack with smoking; urged her to get back in to see the cardiologist as she missed her last appt in July; call 911 if recurs ---> then her EKG was performed and read; inferior and lateral ischemia; patient refused EMS transport, 911; report given to ER and Dr. Etta Quill nurse; I urged patient to NOT smoke a cigarette on the way over to the ER and she agreed

## 2017-07-11 NOTE — ED Triage Notes (Signed)
Pt sent from PCP with c/o possible MI, pt has been having intermittent chest pain with SOB over the past 2 weeks. Denies N/V/

## 2017-07-11 NOTE — Assessment & Plan Note (Signed)
Patient to return to see Dr. Clayborn Bigness

## 2017-07-11 NOTE — Assessment & Plan Note (Signed)
So glad that she is getting treatment for her hep C
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
## 2017-07-12 ENCOUNTER — Other Ambulatory Visit: Payer: Self-pay | Admitting: Psychiatry

## 2017-07-14 ENCOUNTER — Other Ambulatory Visit: Payer: Self-pay | Admitting: Psychiatry

## 2017-07-17 ENCOUNTER — Ambulatory Visit (INDEPENDENT_AMBULATORY_CARE_PROVIDER_SITE_OTHER): Payer: Medicaid Other | Admitting: Family Medicine

## 2017-07-17 ENCOUNTER — Encounter: Payer: Self-pay | Admitting: Family Medicine

## 2017-07-17 VITALS — BP 156/68 | HR 97 | Temp 98.0°F | Resp 16 | Wt 127.0 lb

## 2017-07-17 DIAGNOSIS — I200 Unstable angina: Secondary | ICD-10-CM | POA: Diagnosis not present

## 2017-07-17 DIAGNOSIS — R569 Unspecified convulsions: Secondary | ICD-10-CM | POA: Diagnosis not present

## 2017-07-17 DIAGNOSIS — I2510 Atherosclerotic heart disease of native coronary artery without angina pectoris: Secondary | ICD-10-CM

## 2017-07-17 DIAGNOSIS — D582 Other hemoglobinopathies: Secondary | ICD-10-CM | POA: Diagnosis not present

## 2017-07-17 DIAGNOSIS — Z23 Encounter for immunization: Secondary | ICD-10-CM

## 2017-07-17 NOTE — Addendum Note (Signed)
Addended by: Laverle Hobby on: 07/17/2017 04:51 PM   Modules accepted: Orders

## 2017-07-17 NOTE — Assessment & Plan Note (Signed)
Followed by Dr. Melrose Nakayama, neurologist

## 2017-07-17 NOTE — Assessment & Plan Note (Signed)
Going to see cardiologist after this appointment
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
## 2017-07-17 NOTE — Assessment & Plan Note (Signed)
Likely related to cigarette smoking, but also sleep apnea could be compounding this; note to pulmonologist asking for sleep study; advised patient of risk of possible stroke, brain damage, and the need for evaluation and smoking cessation

## 2017-07-17 NOTE — Patient Instructions (Addendum)
Please do see your cardiologist today I do encourage you to quit smoking Call [phone] to sign up for smoking cessation classes You can call 1-800-QUIT-NOW to talk with a smoking cessation coach We'll see about getting you a sleep study through Dr. Ashby Dawes  Health Risks of Smoking Smoking cigarettes is very bad for your health. Tobacco smoke has over 200 known poisons in it. It contains the poisonous gases nitrogen oxide and carbon monoxide. There are over 60 chemicals in tobacco smoke that cause cancer. Smoking is difficult to quit because a chemical in tobacco, called nicotine, causes addiction or dependence. When you smoke and inhale, nicotine is absorbed rapidly into the bloodstream through your lungs. Both inhaled and non-inhaled nicotine may be addictive. What are the risks of cigarette smoke? Cigarette smokers have an increased risk of many serious medical problems, including:  Lung cancer.  Lung disease, such as pneumonia, bronchitis, and emphysema.  Chest pain (angina) and heart attack because the heart is not getting enough oxygen.  Heart disease and peripheral blood vessel disease.  High blood pressure (hypertension).  Stroke.  Oral cancer, including cancer of the lip, mouth, or voice box.  Bladder cancer.  Pancreatic cancer.  Cervical cancer.  Pregnancy complications, including premature birth.  Stillbirths and smaller newborn babies, birth defects, and genetic damage to sperm.  Early menopause.  Lower estrogen level for women.  Infertility.  Facial wrinkles.  Blindness.  Increased risk of broken bones (fractures).  Senile dementia.  Stomach ulcers and internal bleeding.  Delayed wound healing and increased risk of complications during surgery.  Even smoking lightly shortens your life expectancy by several years.  Because of secondhand smoke exposure, children of smokers have an increased risk of the following:  Sudden infant death  syndrome (SIDS).  Respiratory infections.  Lung cancer.  Heart disease.  Ear infections.  What are the benefits of quitting? There are many health benefits of quitting smoking. Here are some of them:  Within days of quitting smoking, your risk of having a heart attack decreases, your blood flow improves, and your lung capacity improves. Blood pressure, pulse rate, and breathing patterns start returning to normal soon after quitting.  Within months, your lungs may clear up completely.  Quitting for 10 years reduces your risk of developing lung cancer and heart disease to almost that of a nonsmoker.  People who quit may see an improvement in their overall quality of life.  How do I quit smoking? Smoking is an addiction with both physical and psychological effects, and longtime habits can be hard to change. Your health care provider can recommend:  Programs and community resources, which may include group support, education, or talk therapy.  Prescription medicines to help reduce cravings.  Nicotine replacement products, such as patches, gum, and nasal sprays. Use these products only as directed. Do not replace cigarette smoking with electronic cigarettes, which are commonly called e-cigarettes. The safety of e-cigarettes is not known, and some may contain harmful chemicals.  A combination of two or more of these methods.  Where to find more information:  American Lung Association: www.lung.org  American Cancer Society: www.cancer.org Summary  Smoking cigarettes is very bad for your health. Cigarette smokers have an increased risk of many serious medical problems, including several cancers, heart disease, and stroke.  Smoking is an addiction with both physical and psychological effects, and longtime habits can be hard to change.  By stopping right away, you can greatly reduce the risk of medical problems for you  and your family.  To help you quit smoking, your health care  provider can recommend programs, community resources, prescription medicines, and nicotine replacement products such as patches, gum, and nasal sprays. This information is not intended to replace advice given to you by your health care provider. Make sure you discuss any questions you have with your health care provider. Document Released: 10/20/2004 Document Revised: 09/16/2016 Document Reviewed: 09/16/2016 Elsevier Interactive Patient Education  2017 [employer].

## 2017-07-17 NOTE — Assessment & Plan Note (Signed)
Going to see Dr. Clayborn Bigness at 4 pm today; reviewed ER records

## 2017-07-21 ENCOUNTER — Telehealth: Payer: Self-pay | Admitting: *Deleted

## 2017-07-21 NOTE — Telephone Encounter (Signed)
LM patient needs to call office to schedule appt. Patient needs to be scheduled for f/u to discuss sleep before sleep study can be ordered.

## 2017-07-21 NOTE — Telephone Encounter (Signed)
-----   Message from Laverle Hobby, MD sent at 07/21/2017 10:30 AM EDT ----- Regarding: FW: reported sleep apnea by husband Dr. Sanda Klein asks that we bring the patient back in now to address sleep apnea, please schedule followup with me, thanks.  ----- Message ----- From: Arnetha Courser, MD Sent: 07/20/2017   5:27 PM To: Laverle Hobby, MD Subject: RE: reported sleep apnea by husband            I think it would be really helpful for the patient to see you. It is a pleasure to work with you, and I appreciate the prompt communication! Peace, Rip Harbour  ----- Message ----- From: Laverle Hobby, MD Sent: 07/20/2017  11:02 AM To: Arnetha Courser, MD Subject: RE: reported sleep apnea by husband            Unfortunately per insurance requirements we have to document discussion with her and complaint of excessive daytime sleepiness in our notes before we can order a sleep study. We can address this at her next visit with [place] in about 4 months, or if you prefer we can bring her in for a separate visit. Alternatively if you have documented this in your notes you can order a home sleep study and we would be happy to follow up the results. Thanks! -deep.  ----- Message ----- From: Renelda Mom, LPN Sent: [id number]/61/[id number]   8:57 AM To: Laverle Hobby, MD Subject: FW: reported sleep apnea by husband            Please see message below from [facility] in regards to the sleep study for this patient.  Thanks, Misty ----- Message ----- From: Catha Gosselin Sent: 07/18/2017   8:29 AM To: Renelda Mom, LPN Subject: RE: reported sleep apnea by husband            On 05/23/17 when Dr. Juanell Fairly saw this patient, there are no notes in last ov note where he discussed any sleep issues. In order for insurance to cover the study, we will either need to bring patient in and to discuss these issues, then order the study.  Or, Primary care physician can order in lab study if this was discussed with her.    Thanks, Suanne Marker ----- Message ----- From: Renelda Mom, LPN Sent: 32/67/1245   8:12 AM To: Catha Gosselin Subject: FW: reported sleep apnea by husband            You should have an order in your basket for a split night for this patient.   ----- Message ----- From: Laverle Hobby, MD Sent: 07/17/2017   4:51 PM To: Arnetha Courser, MD, [facility] Subject: RE: reported sleep apnea by husband            Yes will order, thank you!  ----- Message ----- From: Arnetha Courser, MD Sent: 07/17/2017   3:11 PM To: Laverle Hobby, MD Subject: reported sleep apnea by husband                Greetings! I am seeing our mutual patient, Cynthia [name]. Her husband reports seeing her stop breathing through the night. Would you consider a sleep study? Thanks! Rip Harbour

## 2017-07-22 ENCOUNTER — Other Ambulatory Visit: Payer: Self-pay | Admitting: Psychiatry

## 2017-07-28 NOTE — Progress Notes (Signed)
PULMONARY OFFICE NOTE Primary MD: Enid Derry Date of initial consultation: 10/22/14  Problems: smoker, COPD, dyspnea, CAD  LDCT 11/06/15:  no suspicious lung lesions but extensive coronary calcifications noted. 11/19/15 PFT: mild obstruction, mild decrease in DLCO. Marginal improvement after BD challenge 11/19/15 CXR: NACPD  INTERVAL HISTORY: Had stopped smoking for 4 months, but then restarted.   SUBJ: Remains markedly limited by DOE. Still smoking 1 ppd. Also reports chest tightness. Needs refills of her inhaler meds. She quit 4 mo with chantix, but now has restarted.  She comes today referred by Dr. Sanda Klein due to excessive daytime sleepiness.   She has combivent respimat, her husband is present and gives some of the history. She uses combivent respimat 2 or 3 times per day. She uses proair once or twice per day as a rescue inhaler.  She has noted significant daytime sleepiness. She snores at night and breathes abnormally in her sleep and sometimes can't breathe. She is using combivent respimat and helps her cough up stuff.  She is smoking half ppd and working on quitting.    Vitals:   07/31/17 1416 07/31/17 1419  BP:  140/80  Pulse:  93  SpO2:  96%  Weight: 130 lb (59 kg)   Height: 5\' 5"  (1.651 m)     EXAM:  Gen: NAD HEENT: WNL Lungs: breath sounds diminished, no wheezes Cardiovascular: Reg, no murmurs noted Abdomen: Soft, nontender, normal BS Ext: without clubbing, cyanosis, edema Neuro: grossly intact  DATA:   BMP Latest Ref Rng & Units 07/11/2017 03/30/2017 12/22/2016  Glucose 65 - 99 mg/dL 122(H) 130(H) 91  BUN 6 - 20 mg/dL 20 10 14   Creatinine 0.44 - 1.00 mg/dL 0.97 0.86 0.90  BUN/Creat Ratio 11 - 26 - - -  Sodium 135 - 145 mmol/L 136 135 137  Potassium 3.5 - 5.1 mmol/L 3.9 3.9 4.0  Chloride 101 - 111 mmol/L 103 104 104  CO2 22 - 32 mmol/L 24 21 18(L)  Calcium 8.9 - 10.3 mg/dL 10.2 9.5 10.4    CBC Latest Ref Rng & Units 07/11/2017 03/30/2017 12/22/2016  WBC 3.6  - 11.0 K/uL 9.6 7.9 9.4  Hemoglobin 12.0 - 16.0 g/dL 17.2(H) 15.5 16.4(H)  Hematocrit 35.0 - 47.0 % 50.0(H) 45.7(H) 48.2(H)  Platelets 150 - 440 K/uL 188 169 155    CXR:  NNF  IMPRESSION:   Chronic obstructive pulmonary disease - mild by PFTs.  Severe DOE - in part due to deconditioning Recalcitrant smoker. Spent greater than 3 minutes in discussion today regarding smoking cessation Excessive daytime sleepiness with snoring and witness apneas PLAN:  Counseled again re: smoking cessation greater than 3 minutes. Changed Combivent Respimat to Bevespi Respimat, 2 puffs twice a day Continue pro-air 2 puffs when necessary Discussed increasing activity level Will send for sleep study.    Marda Stalker, MD PCCM service Pager [phone] 07/28/2017

## 2017-07-31 ENCOUNTER — Other Ambulatory Visit: Payer: Self-pay | Admitting: Internal Medicine

## 2017-07-31 ENCOUNTER — Ambulatory Visit (INDEPENDENT_AMBULATORY_CARE_PROVIDER_SITE_OTHER): Payer: Medicaid Other | Admitting: Internal Medicine

## 2017-07-31 ENCOUNTER — Other Ambulatory Visit: Payer: Self-pay

## 2017-07-31 ENCOUNTER — Encounter: Payer: Self-pay | Admitting: Psychiatry

## 2017-07-31 ENCOUNTER — Encounter: Payer: Self-pay | Admitting: Internal Medicine

## 2017-07-31 ENCOUNTER — Ambulatory Visit (INDEPENDENT_AMBULATORY_CARE_PROVIDER_SITE_OTHER): Payer: Medicaid Other | Admitting: Psychiatry

## 2017-07-31 VITALS — BP 177/94 | HR 92 | Temp 97.9°F | Wt 129.6 lb

## 2017-07-31 VITALS — BP 140/80 | HR 93 | Ht 65.0 in | Wt 130.0 lb

## 2017-07-31 DIAGNOSIS — G4719 Other hypersomnia: Secondary | ICD-10-CM | POA: Diagnosis not present

## 2017-07-31 DIAGNOSIS — F3160 Bipolar disorder, current episode mixed, unspecified: Secondary | ICD-10-CM | POA: Diagnosis not present

## 2017-07-31 DIAGNOSIS — I2510 Atherosclerotic heart disease of native coronary artery without angina pectoris: Secondary | ICD-10-CM

## 2017-07-31 DIAGNOSIS — F17210 Nicotine dependence, cigarettes, uncomplicated: Secondary | ICD-10-CM | POA: Diagnosis not present

## 2017-07-31 DIAGNOSIS — R0602 Shortness of breath: Secondary | ICD-10-CM

## 2017-07-31 MED ORDER — AMANTADINE HCL 100 MG PO CAPS: 100.0000 mg | ORAL_CAPSULE | Freq: Every day | ORAL | 2 refills | Status: DC

## 2017-07-31 MED ORDER — QUETIAPINE FUMARATE 50 MG PO TABS: 50.0000 mg | ORAL_TABLET | Freq: Three times a day (TID) | ORAL | 3 refills | Status: DC

## 2017-07-31 MED ORDER — OXCARBAZEPINE 600 MG PO TABS: 600.0000 mg | ORAL_TABLET | Freq: Two times a day (BID) | ORAL | 2 refills | Status: DC

## 2017-07-31 MED ORDER — LITHIUM CARBONATE 300 MG PO CAPS: 300.0000 mg | ORAL_CAPSULE | Freq: Two times a day (BID) | ORAL | 2 refills | Status: DC

## 2017-07-31 MED ORDER — GABAPENTIN 300 MG PO CAPS: 300.0000 mg | ORAL_CAPSULE | Freq: Every day | ORAL | 2 refills | Status: DC

## 2017-07-31 NOTE — Patient Instructions (Signed)
--  Will send for sleep study.    Sleep Apnea Sleep apnea is disorder that affects a person's sleep. A person with sleep apnea has abnormal pauses in their breathing when they sleep. It is hard for them to get a good sleep. This makes a person tired during the day. It also can lead to other physical problems. There are three types of sleep apnea. One type is when breathing stops for a short time because your airway is blocked (obstructive sleep apnea). Another type is when the brain sometimes fails to give the normal signal to breathe to the muscles that control your breathing (central sleep apnea). The third type is a combination of the other two types. HOME CARE   Take all medicine as told by your doctor.  Avoid alcohol, calming medicines (sedatives), and depressant drugs.  Try to lose weight if you are overweight. Talk to your doctor about a healthy weight goal.  Your doctor may have you use a device that helps to open your airway. It can help you get the air that you need. It is called a positive airway pressure (PAP) device.   MAKE SURE YOU:   Understand these instructions.  Will watch your condition.  Will get help right away if you are not doing well or get worse.  It may take approximately 1 month for you to get used to wearing her CPAP every night.  Be sure to work with your machine to get used to it, be patient, it may take time! 
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
## 2017-08-03 ENCOUNTER — Ambulatory Visit
Admission: RE | Admit: 2017-08-03 | Discharge: 2017-08-03 | Disposition: A | Discharge status: Home / Self Care | Payer: Medicaid Other | Source: Ambulatory Visit | Attending: Internal Medicine | Admitting: Internal Medicine

## 2017-08-03 DIAGNOSIS — I080 Rheumatic disorders of both mitral and aortic valves: Secondary | ICD-10-CM | POA: Diagnosis not present

## 2017-08-03 DIAGNOSIS — I10 Essential (primary) hypertension: Secondary | ICD-10-CM | POA: Diagnosis not present

## 2017-08-03 DIAGNOSIS — Z87891 Personal history of nicotine dependence: Secondary | ICD-10-CM | POA: Insufficient documentation

## 2017-08-03 DIAGNOSIS — I2510 Atherosclerotic heart disease of native coronary artery without angina pectoris: Secondary | ICD-10-CM

## 2017-08-03 DIAGNOSIS — I441 Atrioventricular block, second degree: Secondary | ICD-10-CM | POA: Diagnosis not present

## 2017-08-03 DIAGNOSIS — F319 Bipolar disorder, unspecified: Secondary | ICD-10-CM | POA: Insufficient documentation

## 2017-08-03 DIAGNOSIS — B192 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: Secondary | ICD-10-CM | POA: Insufficient documentation

## 2017-08-03 DIAGNOSIS — J449 Chronic obstructive pulmonary disease, unspecified: Secondary | ICD-10-CM | POA: Diagnosis not present

## 2017-08-03 DIAGNOSIS — Z87820 Personal history of traumatic brain injury: Secondary | ICD-10-CM | POA: Diagnosis not present

## 2017-08-03 DIAGNOSIS — R0602 Shortness of breath: Secondary | ICD-10-CM | POA: Insufficient documentation

## 2017-08-03 NOTE — Progress Notes (Signed)
*  PRELIMINARY RESULTS* Echocardiogram 2D Echocardiogram has been performed.  [name] 08/03/2017, 12:00 PM

## 2017-08-10 ENCOUNTER — Telehealth: Payer: Self-pay | Admitting: Internal Medicine

## 2017-08-10 NOTE — Telephone Encounter (Signed)
Patient Husband Cynthia [name] Calling to schedule sleep study

## 2017-08-10 NOTE — Telephone Encounter (Signed)
ATC pt's husband, [name]. No answer. Advised on VM that we didn't schedule the Sleep Study here at our office. This is scheduled with [facility]. Provided phone number of [phone] or [phone]. Advised patient to contact either number and [facility] could assist him in scheduling this appointment.  If he needed to reach me back that he could contact me at [phone]. Rhonda J Cobb

## 2017-08-19 ENCOUNTER — Other Ambulatory Visit: Payer: Self-pay | Admitting: Psychiatry

## 2017-08-19 DIAGNOSIS — F3160 Bipolar disorder, current episode mixed, unspecified: Secondary | ICD-10-CM

## 2017-08-21 ENCOUNTER — Ambulatory Visit: Admission: RE | Admit: 2017-08-21 | Payer: Medicaid Other | Source: Ambulatory Visit

## 2017-08-28 ENCOUNTER — Encounter: Payer: Self-pay | Admitting: Gastroenterology

## 2017-08-28 ENCOUNTER — Other Ambulatory Visit: Payer: Self-pay

## 2017-08-28 DIAGNOSIS — B182 Chronic viral hepatitis C: Secondary | ICD-10-CM

## 2017-08-28 NOTE — Telephone Encounter (Signed)
error 

## 2017-08-28 NOTE — Telephone Encounter (Signed)
Cynthia [name] with [name] called and needs a prior [employer]

## 2017-08-29 ENCOUNTER — Other Ambulatory Visit
Admission: RE | Admit: 2017-08-29 | Discharge: 2017-08-29 | Disposition: A | Discharge status: Home / Self Care | Payer: Medicaid Other | Source: Ambulatory Visit | Attending: Gastroenterology | Admitting: Gastroenterology

## 2017-08-29 DIAGNOSIS — B182 Chronic viral hepatitis C: Secondary | ICD-10-CM | POA: Diagnosis present

## 2017-08-29 LAB — HEPATIC FUNCTION PANEL
ALT: 9 U/L — ABNORMAL LOW (ref 14–54)
AST: 20 U/L (ref 15–41)
Albumin: 3.8 g/dL (ref 3.5–5.0)
Alkaline Phosphatase: 78 U/L (ref 38–126)
Bilirubin, Direct: <0.1 mg/dL — ABNORMAL LOW (ref 0.1–0.5)
Total Bilirubin: 0.1 mg/dL — ABNORMAL LOW (ref 0.3–1.2)
Total Protein: 7.6 g/dL (ref 6.5–8.1)

## 2017-08-31 ENCOUNTER — Ambulatory Visit: Payer: Medicaid Other | Attending: Internal Medicine

## 2017-08-31 DIAGNOSIS — G4710 Hypersomnia, unspecified: Secondary | ICD-10-CM | POA: Diagnosis present

## 2017-08-31 LAB — HCV RT-PCR, QUANT (GRAPH): HCV Quantitative: NOT DETECTED IU/mL (ref >50)

## 2017-09-01 ENCOUNTER — Telehealth: Payer: Self-pay

## 2017-09-01 DIAGNOSIS — G4733 Obstructive sleep apnea (adult) (pediatric): Secondary | ICD-10-CM | POA: Diagnosis not present

## 2017-09-01 NOTE — Telephone Encounter (Signed)
Pt notified of lab results

## 2017-09-01 NOTE — Telephone Encounter (Signed)
-----   Message from Lucilla Lame, MD sent at 08/31/2017  5:05 PM EST ----- Let the patient know her viral load is now undetectable.  Her liver enzymes are also normal.

## 2017-09-04 ENCOUNTER — Ambulatory Visit: Payer: Medicaid Other | Admitting: Licensed Clinical Social Worker

## 2017-09-06 ENCOUNTER — Telehealth: Payer: Self-pay | Admitting: *Deleted

## 2017-09-06 NOTE — Telephone Encounter (Signed)
Called patient with results of sleep study. Sleep study negative: Recommendation is to follow up with referring physician. If sleep apnea still suspected then home sleep study recommended.

## 2017-09-07 NOTE — Telephone Encounter (Signed)
Husband informed. Nothing further needed.

## 2017-09-16 ENCOUNTER — Other Ambulatory Visit: Payer: Self-pay | Admitting: Family Medicine

## 2017-10-02 ENCOUNTER — Ambulatory Visit (INDEPENDENT_AMBULATORY_CARE_PROVIDER_SITE_OTHER): Payer: Medicaid Other | Admitting: Licensed Clinical Social Worker

## 2017-10-02 DIAGNOSIS — F3160 Bipolar disorder, current episode mixed, unspecified: Secondary | ICD-10-CM

## 2017-10-19 ENCOUNTER — Telehealth: Payer: Self-pay | Admitting: Gastroenterology

## 2017-10-19 NOTE — Telephone Encounter (Signed)
Spoke with [facility] and advised she has completed treatment. They will close her account.

## 2017-10-19 NOTE — Telephone Encounter (Signed)
Charity with [facility] # [phone] #1 has been unable to contact patient regarding Mavret. Last delivery was 08/12/17. Is the patient still on treatment? Please call.

## 2017-10-20 ENCOUNTER — Other Ambulatory Visit: Payer: Self-pay | Admitting: Psychiatry

## 2017-10-27 ENCOUNTER — Encounter: Payer: Self-pay | Admitting: Family Medicine

## 2017-10-30 ENCOUNTER — Other Ambulatory Visit: Payer: Self-pay

## 2017-10-30 ENCOUNTER — Other Ambulatory Visit: Payer: Self-pay | Admitting: Psychiatry

## 2017-10-30 ENCOUNTER — Ambulatory Visit (INDEPENDENT_AMBULATORY_CARE_PROVIDER_SITE_OTHER): Payer: Medicaid Other | Admitting: Psychiatry

## 2017-10-30 ENCOUNTER — Emergency Department
Admission: EM | Admit: 2017-10-30 | Discharge: 2017-10-30 | Disposition: A | Discharge status: Home / Self Care | Payer: Medicaid Other | Attending: Emergency Medicine | Admitting: Emergency Medicine

## 2017-10-30 ENCOUNTER — Encounter: Payer: Self-pay | Admitting: Psychiatry

## 2017-10-30 ENCOUNTER — Encounter: Payer: Self-pay | Admitting: Intensive Care

## 2017-10-30 VITALS — BP 191/78 | HR 86 | Temp 98.7°F | Wt 132.8 lb

## 2017-10-30 DIAGNOSIS — F1210 Cannabis abuse, uncomplicated: Secondary | ICD-10-CM

## 2017-10-30 DIAGNOSIS — Z5321 Procedure and treatment not carried out due to patient leaving prior to being seen by health care provider: Secondary | ICD-10-CM | POA: Insufficient documentation

## 2017-10-30 DIAGNOSIS — F3160 Bipolar disorder, current episode mixed, unspecified: Secondary | ICD-10-CM

## 2017-10-30 DIAGNOSIS — I10 Essential (primary) hypertension: Secondary | ICD-10-CM | POA: Insufficient documentation

## 2017-10-30 MED ORDER — LITHIUM CARBONATE 300 MG PO CAPS: 300.0000 mg | ORAL_CAPSULE | Freq: Two times a day (BID) | ORAL | 2 refills | Status: DC

## 2017-10-30 MED ORDER — GABAPENTIN 300 MG PO CAPS: 300.0000 mg | ORAL_CAPSULE | Freq: Every day | ORAL | 2 refills | Status: DC

## 2017-10-30 MED ORDER — AMANTADINE HCL 100 MG PO CAPS: 100.0000 mg | ORAL_CAPSULE | Freq: Every day | ORAL | 2 refills | Status: DC

## 2017-10-30 MED ORDER — QUETIAPINE FUMARATE 50 MG PO TABS: 50.0000 mg | ORAL_TABLET | Freq: Three times a day (TID) | ORAL | 3 refills | Status: DC

## 2017-10-30 MED ORDER — OXCARBAZEPINE 600 MG PO TABS: 600.0000 mg | ORAL_TABLET | Freq: Two times a day (BID) | ORAL | 2 refills | Status: DC

## 2017-10-30 NOTE — ED Notes (Signed)
Called in waiting room.  No answer. 

## 2017-10-30 NOTE — ED Triage Notes (Signed)
Sent to ED by psychiatrist for evaluation of blood pressure.
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
## 2017-10-30 NOTE — ED Triage Notes (Signed)
Patient was sent here due to HTN. Patient states it was 191/78 before arriving to ER. Patient c/o headache at this time. A&O x4

## 2017-11-02 ENCOUNTER — Encounter: Payer: Self-pay | Admitting: Family Medicine

## 2017-11-02 ENCOUNTER — Ambulatory Visit: Payer: Medicaid Other | Admitting: Family Medicine

## 2017-11-02 VITALS — BP 136/72 | HR 83 | Temp 98.2°F | Resp 16 | Wt 131.7 lb

## 2017-11-02 DIAGNOSIS — R779 Abnormality of plasma protein, unspecified: Secondary | ICD-10-CM

## 2017-11-02 DIAGNOSIS — I10 Essential (primary) hypertension: Secondary | ICD-10-CM

## 2017-11-02 DIAGNOSIS — D696 Thrombocytopenia, unspecified: Secondary | ICD-10-CM | POA: Diagnosis not present

## 2017-11-02 DIAGNOSIS — I2510 Atherosclerotic heart disease of native coronary artery without angina pectoris: Secondary | ICD-10-CM | POA: Diagnosis not present

## 2017-11-02 DIAGNOSIS — E8809 Other disorders of plasma-protein metabolism, not elsewhere classified: Secondary | ICD-10-CM

## 2017-11-02 DIAGNOSIS — B182 Chronic viral hepatitis C: Secondary | ICD-10-CM

## 2017-11-02 DIAGNOSIS — J449 Chronic obstructive pulmonary disease, unspecified: Secondary | ICD-10-CM | POA: Diagnosis not present

## 2017-11-02 DIAGNOSIS — Z113 Encounter for screening for infections with a predominantly sexual mode of transmission: Secondary | ICD-10-CM

## 2017-11-02 DIAGNOSIS — D582 Other hemoglobinopathies: Secondary | ICD-10-CM

## 2017-11-02 DIAGNOSIS — R739 Hyperglycemia, unspecified: Secondary | ICD-10-CM

## 2017-11-02 MED ORDER — AMLODIPINE BESYLATE 10 MG PO TABS: 10.0000 mg | ORAL_TABLET | Freq: Every day | ORAL | 11 refills | Status: DC

## 2017-11-02 NOTE — Assessment & Plan Note (Signed)
Check liver enzymes today 

## 2017-11-02 NOTE — Assessment & Plan Note (Signed)
Check CBC 

## 2017-11-02 NOTE — Patient Instructions (Signed)
You can try plain Mucinex to help loosen the phlegm in your airways so you can cough it up easier We'll get labs If you have not heard anything from my staff in a week about any orders/referrals/studies from today, please contact us here to follow-up [phone] I do encourage you to quit smoking Call [phone] to sign up for smoking cessation classes You can call 1-800-QUIT-NOW to talk with a smoking cessation coach

## 2017-11-02 NOTE — Assessment & Plan Note (Signed)
Controlled; stay away from salt

## 2017-11-02 NOTE — Assessment & Plan Note (Signed)
Check A1c. 

## 2017-11-02 NOTE — Assessment & Plan Note (Signed)
Finished with treatment; she will find out soon if treatment resulted in cure

## 2017-11-02 NOTE — Assessment & Plan Note (Signed)
Seeing cardiologist; asymptomatic; on aspirin

## 2017-11-02 NOTE — Assessment & Plan Note (Signed)
Stable; encouraged smoking cessation
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
## 2017-11-03 LAB — COMPLETE METABOLIC PANEL WITH GFR
AG Ratio: 1.2 (calc) (ref 1.0–2.5)
ALT: 11 U/L (ref 6–29)
AST: 16 U/L (ref 10–35)
Albumin: 4.3 g/dL (ref 3.6–5.1)
Alkaline phosphatase (APISO): 80 U/L (ref 33–130)
BUN: 10 mg/dL (ref 7–25)
CO2: 22 mmol/L (ref 20–32)
Calcium: 9.9 mg/dL (ref 8.6–10.4)
Chloride: 110 mmol/L (ref 98–110)
Creat: 0.93 mg/dL (ref 0.50–0.99)
GFR, Est African American: 76 mL/min/{1.73_m2} (ref >=60)
GFR, Est Non African American: 66 mL/min/{1.73_m2} (ref >=60)
Globulin: 3.7 g/dL (calc) (ref 1.9–3.7)
Glucose, Bld: 97 mg/dL (ref 65–139)
Potassium: 3.9 mmol/L (ref 3.5–5.3)
Sodium: 140 mmol/L (ref 135–146)
Total Bilirubin: 0.3 mg/dL (ref 0.2–1.2)
Total Protein: 8 g/dL (ref 6.1–8.1)

## 2017-11-03 LAB — CBC WITH DIFFERENTIAL/PLATELET
Basophils Absolute: 139 cells/uL (ref 0–200)
Basophils Relative: 1.8 %
Eosinophils Absolute: 216 cells/uL (ref 15–500)
Eosinophils Relative: 2.8 %
HCT: 44.6 % (ref 35.0–45.0)
Hemoglobin: 15.3 g/dL (ref 11.7–15.5)
Lymphs Abs: 2903 cells/uL (ref 850–3900)
MCH: 31.4 pg (ref 27.0–33.0)
MCHC: 34.3 g/dL (ref 32.0–36.0)
MCV: 91.6 fL (ref 80.0–100.0)
MPV: 10.1 fL (ref 7.5–12.5)
Monocytes Relative: 4.5 %
Neutro Abs: 4096 cells/uL (ref 1500–7800)
Neutrophils Relative %: 53.2 %
Platelets: 178 10*3/uL (ref 140–400)
RBC: 4.87 10*6/uL (ref 3.80–5.10)
RDW: 11.8 % (ref 11.0–15.0)
Total Lymphocyte: 37.7 %
WBC mixed population: 347 cells/uL (ref 200–950)
WBC: 7.7 10*3/uL (ref 3.8–10.8)

## 2017-11-03 LAB — LIPID PANEL
Cholesterol: 190 mg/dL (ref <200)
HDL: 50 mg/dL — ABNORMAL LOW (ref >50)
LDL Cholesterol (Calc): 91 mg/dL (calc)
Non-HDL Cholesterol (Calc): 140 mg/dL (calc) — ABNORMAL HIGH (ref <130)
Total CHOL/HDL Ratio: 3.8 (calc) (ref <5.0)
Triglycerides: 370 mg/dL — ABNORMAL HIGH (ref <150)

## 2017-11-03 LAB — HEMOGLOBIN A1C
Hgb A1c MFr Bld: 4.9 % of total Hgb (ref <5.7)
Mean Plasma Glucose: 94 (calc)
eAG (mmol/L): 5.2 (calc)

## 2017-11-04 LAB — HCV RNA,QUANTITATIVE REAL TIME PCR
HCV Quantitative Log: 1.23 Log IU/mL — ABNORMAL HIGH
HCV RNA, PCR, QN: 17 IU/mL — ABNORMAL HIGH

## 2017-11-04 LAB — HEPATITIS PANEL, ACUTE
Hep A IgM: NONREACTIVE
Hep B C IgM: NONREACTIVE
Hepatitis B Surface Ag: NONREACTIVE
Hepatitis C Ab: REACTIVE — AB
SIGNAL TO CUT-OFF: 34 — ABNORMAL HIGH (ref <1.00)

## 2017-11-04 LAB — RPR: RPR Ser Ql: NONREACTIVE

## 2017-11-04 LAB — HIV ANTIBODY (ROUTINE TESTING W REFLEX): HIV 1&2 Ab, 4th Generation: NONREACTIVE

## 2017-11-07 ENCOUNTER — Telehealth: Payer: Self-pay | Admitting: *Deleted

## 2017-11-07 DIAGNOSIS — Z87891 Personal history of nicotine dependence: Secondary | ICD-10-CM

## 2017-11-07 DIAGNOSIS — Z122 Encounter for screening for malignant neoplasm of respiratory organs: Secondary | ICD-10-CM

## 2017-11-07 NOTE — Telephone Encounter (Signed)
Left message for patient to notify them that it is time to schedule annual low dose lung cancer screening CT scan. Instructed patient to call back to verify information prior to the scan being scheduled.  

## 2017-11-07 NOTE — Telephone Encounter (Signed)
Notified patient that annual lung cancer screening low dose CT scan is due currently or will be in near future. Confirmed that patient is within the age range of 55-77, and asymptomatic, (no signs or symptoms of lung cancer). Patient denies illness that would prevent curative treatment for lung cancer if found. Verified smoking history, (current, 85 pack year). The shared decision making visit was done 11/06/15. Patient is agreeable for CT scan being scheduled.

## 2017-11-11 NOTE — Telephone Encounter (Signed)
Closing out refill request from 04/14/17

## 2017-11-11 NOTE — Telephone Encounter (Signed)
Closing out note from 04/18/17 to check on referral

## 2017-11-14 ENCOUNTER — Ambulatory Visit
Admission: RE | Admit: 2017-11-14 | Discharge: 2017-11-14 | Disposition: A | Discharge status: Home / Self Care | Payer: Medicaid Other | Source: Ambulatory Visit | Attending: Oncology | Admitting: Oncology

## 2017-11-14 DIAGNOSIS — Z122 Encounter for screening for malignant neoplasm of respiratory organs: Secondary | ICD-10-CM | POA: Diagnosis present

## 2017-11-14 DIAGNOSIS — Z87891 Personal history of nicotine dependence: Secondary | ICD-10-CM | POA: Diagnosis present

## 2017-11-14 DIAGNOSIS — J439 Emphysema, unspecified: Secondary | ICD-10-CM | POA: Diagnosis not present

## 2017-11-14 DIAGNOSIS — I700 Atherosclerosis of aorta: Secondary | ICD-10-CM | POA: Insufficient documentation

## 2017-11-16 ENCOUNTER — Other Ambulatory Visit: Payer: Self-pay

## 2017-11-16 NOTE — Telephone Encounter (Signed)
Pt wants to quit smoking and wants to try Nicotrol inhaler?

## 2017-11-20 ENCOUNTER — Telehealth: Payer: Self-pay

## 2017-11-20 ENCOUNTER — Encounter: Payer: Self-pay | Admitting: *Deleted

## 2017-11-20 NOTE — Telephone Encounter (Signed)
-----   Message from Lucilla Lame, MD sent at 11/20/2017 12:39 PM EST ----- This patient should have her hepatitis C viral load checked in one month's time.

## 2017-11-20 NOTE — Telephone Encounter (Signed)
Tried contacting pt but vm box was not set up. Not able to leave message.

## 2017-11-21 NOTE — Telephone Encounter (Signed)
Okay but I need a pharmacy please Encouraged her to attend the smoking cessation classes at the hospital and/or call 1-800-QUIT-NOW too Thank you

## 2017-11-21 NOTE — Telephone Encounter (Signed)
Contact pt again today, Pt's husband was notified of results. Added message to recall list to contact him in 1 month to recheck viral load.

## 2017-11-21 NOTE — Telephone Encounter (Signed)
Left voicemail as to which pharmacy.

## 2017-11-22 MED ORDER — NICOTINE 10 MG IN INHA: 1.0000 | RESPIRATORY_TRACT | 0 refills | Status: DC | PRN

## 2017-11-22 NOTE — Telephone Encounter (Signed)
Pharmacy updated.

## 2017-11-27 ENCOUNTER — Ambulatory Visit: Payer: Medicaid Other | Admitting: Internal Medicine

## 2017-11-27 ENCOUNTER — Encounter: Payer: Self-pay | Admitting: Internal Medicine

## 2017-11-27 ENCOUNTER — Ambulatory Visit: Payer: Medicaid Other | Admitting: Psychiatry

## 2017-11-27 VITALS — BP 128/86 | HR 93 | Ht 66.0 in | Wt 131.0 lb

## 2017-11-27 DIAGNOSIS — F17200 Nicotine dependence, unspecified, uncomplicated: Secondary | ICD-10-CM | POA: Diagnosis not present

## 2017-11-27 DIAGNOSIS — R0609 Other forms of dyspnea: Secondary | ICD-10-CM | POA: Diagnosis not present

## 2017-11-27 DIAGNOSIS — J449 Chronic obstructive pulmonary disease, unspecified: Secondary | ICD-10-CM | POA: Diagnosis not present

## 2017-11-27 DIAGNOSIS — R0600 Dyspnea, unspecified: Secondary | ICD-10-CM

## 2017-11-27 NOTE — Patient Instructions (Addendum)
Start Bevespi (twist) inhaler 2 puffs once daily.  Use rescue inhaler as needed.  Use nicotrol inhaler.   Continue with CT lung cancer screening.

## 2017-11-27 NOTE — Progress Notes (Signed)
* [facility]     Assessment and Plan:  IMPRESSION:   Chronic obstructive pulmonary disease - mild by PFTs.  Severe DOE - in part due to deconditioning Recalcitrant smoker. Spent greater than 3 minutes in discussion today regarding smoking cessation Emphysema, seen on CT lung cancer screening.  Mild emphysematous changes in the apices.  PLAN:  Discussed using Bevespi Respimat 2 puffs once daily.  Continue pro-air 2 puffs when necessary Discussed increasing activity level Continue with CT lung cancer screening program.  Date: 11/27/2017  MRN# [id number] Cynthia [name] 07-08-1956   Cynthia [name] is a 62 y.o. old female seen in follow up for chief complaint of  Chief Complaint  Patient presents with  . Follow-up    SOB w/activity: cough, prod at times     HPI:  Remains markedly limited by DOE. Still smoking 1 ppd. Also reports chest tightness. Needs refills of her inhaler meds. She quit 4 mo with chantix in the past, she has restarted, and continues to smoke 1 ppd.  Her sleep study was negative.   She has bevespi respimat, her husband is present and gives some of the history. She has been using bevespi prn, she uses albuterol twice per day.   **Sleep study 08/31/17; negative for OSA but poor sleep time. **Imaging personally reviewed, low-dose CT chest 11/14/17; apical emphysema, mild to moderate.  A few early bleb formation areas  Medication:    Current Outpatient Medications:  .  albuterol (PROAIR HFA) 108 (90 Base) MCG/ACT inhaler, Inhale 2 puffs into the lungs every 6 (six) hours as needed for wheezing or shortness of breath., Disp: 1 Inhaler, Rfl: 1 .  amantadine (SYMMETREL) 100 MG capsule, Take 1 capsule (100 mg total) by mouth daily., Disp: 30 capsule, Rfl: 2 .  amLODipine (NORVASC) 10 MG tablet, Take 1 tablet (10 mg total) by mouth daily., Disp: 30 tablet, Rfl: 11 .  aspirin EC 81 MG tablet, Take 1 tablet (81 mg total) by mouth daily., Disp: , Rfl:    .  gabapentin (NEURONTIN) 300 MG capsule, Take 1 capsule (300 mg total) by mouth daily., Disp: 30 capsule, Rfl: 2 .  Glycopyrrolate-Formoterol (BEVESPI AEROSPHERE) 9-4.8 MCG/ACT AERO, Inhale 2 puffs into the lungs 2 (two) times daily. (Patient taking differently: Inhale 2 puffs into the lungs 2 (two) times daily as needed. For shortness of breath), Disp: 1 Inhaler, Rfl: 11 .  lithium carbonate 300 MG capsule, Take 1 capsule (300 mg total) by mouth 2 (two) times daily with a meal., Disp: 60 capsule, Rfl: 2 .  nicotine (NICOTROL) 10 MG inhaler, Inhale 1 Cartridge (1 continuous puffing total) into the lungs as needed for smoking cessation., Disp: 42 each, Rfl: 0 .  nitroGLYCERIN (NITROSTAT) 0.4 MG SL tablet, Place 1 tablet (0.4 mg total) under the tongue every 5 (five) minutes as needed for chest pain. Maximum of 3 pills; call 911 at first sign of chest pain, Disp: 25 tablet, Rfl: 1 .  oxcarbazepine (TRILEPTAL) 600 MG tablet, Take 1 tablet (600 mg total) by mouth 2 (two) times daily., Disp: 60 tablet, Rfl: 2 .  QUEtiapine (SEROQUEL) 50 MG tablet, Take 1 tablet (50 mg total) by mouth 3 (three) times daily. \, Disp: 90 tablet, Rfl: 3 .  vitamin C (ASCORBIC ACID) 500 MG tablet, Take 500 mg by mouth daily., Disp: , Rfl:    Allergies:  Morphine  Review of Systems: Gen:  Denies  fever, sweats. HEENT: Denies blurred vision. Cvc:  No dizziness,  chest pain or heaviness Resp:   Denies cough or sputum porduction. Gi: Denies swallowing difficulty, stomach pain. constipation, bowel incontinence Gu:  Denies bladder incontinence, burning urine Ext:   No Joint pain, stiffness. Skin: No skin rash, easy bruising. Endoc:  No polyuria, polydipsia. Psych: No depression, insomnia. Other:  All other systems were reviewed and found to be negative other than what is mentioned in the HPI.   Physical Examination:   VS: BP 128/86 (BP Location: Left Arm, Cuff Size: Normal)   Pulse 93   Ht 5\' 6"  (1.676 m)   Wt 131  lb (59.4 kg)   SpO2 99%   BMI 21.14 kg/m    General Appearance: No distress  Neuro:without focal findings,  speech normal,  HEENT: PERRLA, EOM intact. Pulmonary: normal breath sounds, No wheezing.   CardiovascularNormal S1,S2.  No m/r/g.   Abdomen: Benign, Soft, non-tender. Renal:  No costovertebral tenderness  GU:  Not performed at this time. Endoc: No evident thyromegaly, no signs of acromegaly. Skin:   warm, no rash. Extremities: normal, no cyanosis, clubbing.   LABORATORY PANEL:   CBC No results for input(s): WBC, HGB, HCT, PLT in the last 168 hours. ------------------------------------------------------------------------------------------------------------------  Chemistries  No results for input(s): NA, K, CL, CO2, GLUCOSE, BUN, CREATININE, CALCIUM, MG, AST, ALT, ALKPHOS, BILITOT in the last 168 hours.  Invalid input(s): GFRCGP ------------------------------------------------------------------------------------------------------------------  Cardiac Enzymes No results for input(s): TROPONINI in the last 168 hours. ------------------------------------------------------------  RADIOLOGY:   No results found for this or any previous visit. Results for orders placed during the hospital encounter of 07/11/17  DG Chest 2 View   Narrative CLINICAL DATA:  Left chest pain, shortness of breath, nausea and headache for a few weeks.  EXAM: CHEST  2 VIEW  COMPARISON:  CT chest 11/14/2016.  PA and lateral chest 11/19/2015.  FINDINGS: The lungs clear. Heart size is normal. Aortic atherosclerosis is seen. Pacing device is in place. No acute bony abnormality.  IMPRESSION: Emphysema without acute disease.   Electronically Signed   By: Inge Rise M.D.   On: 07/11/2017 15:20    ------------------------------------------------------------------------------------------------------------------  Thank  you for allowing [facility] to assist  in the care of your patient. Our recommendations are noted above.  Please contact us if we can be of further service.   Marda Stalker, MD.  [facility] Office Number: [phone]  Patricia Pesa, M.D.  Merton Border, M.D  11/27/2017

## 2017-12-29 ENCOUNTER — Other Ambulatory Visit: Payer: Self-pay | Admitting: Family Medicine

## 2017-12-29 ENCOUNTER — Other Ambulatory Visit: Payer: Self-pay | Admitting: Psychiatry

## 2018-02-01 ENCOUNTER — Ambulatory Visit: Payer: Self-pay | Admitting: Family Medicine

## 2018-02-02 ENCOUNTER — Telehealth: Payer: Self-pay | Admitting: Family Medicine

## 2018-02-02 DIAGNOSIS — B182 Chronic viral hepatitis C: Secondary | ICD-10-CM

## 2018-02-02 NOTE — Telephone Encounter (Signed)
Please let patient know that we missed her the other day We'll hope she'll reschedule We also wanted to remind her that it's time for additional labs to follow-up on her hepatitis C treatment Thank you

## 2018-02-02 NOTE — Telephone Encounter (Signed)
-----   Message from Arnetha Courser, MD sent at 11/09/2017  8:09 AM EST ----- Regarding: recheck viral quant in 3-6 mos (May-July) See Dr. Dorothey Baseman note; he suggested rechecking viral quant 3-6 months after last to see if she's been re-infected

## 2018-02-05 NOTE — Telephone Encounter (Signed)
Left detailed voicemail

## 2018-02-13 ENCOUNTER — Other Ambulatory Visit: Payer: Self-pay | Admitting: Psychiatry

## 2018-02-15 ENCOUNTER — Other Ambulatory Visit: Payer: Self-pay | Admitting: Family Medicine

## 2018-02-15 ENCOUNTER — Other Ambulatory Visit: Payer: Self-pay | Admitting: Psychiatry

## 2018-03-05 ENCOUNTER — Encounter: Payer: Self-pay | Admitting: Family Medicine

## 2018-03-05 ENCOUNTER — Ambulatory Visit: Payer: Medicaid Other | Admitting: Family Medicine

## 2018-03-05 VITALS — BP 162/84 | HR 89 | Temp 98.1°F | Resp 14 | Ht 66.0 in | Wt 122.2 lb

## 2018-03-05 DIAGNOSIS — R300 Dysuria: Secondary | ICD-10-CM

## 2018-03-05 DIAGNOSIS — R739 Hyperglycemia, unspecified: Secondary | ICD-10-CM

## 2018-03-05 DIAGNOSIS — Z23 Encounter for immunization: Secondary | ICD-10-CM

## 2018-03-05 DIAGNOSIS — D696 Thrombocytopenia, unspecified: Secondary | ICD-10-CM | POA: Diagnosis not present

## 2018-03-05 DIAGNOSIS — J449 Chronic obstructive pulmonary disease, unspecified: Secondary | ICD-10-CM

## 2018-03-05 DIAGNOSIS — I10 Essential (primary) hypertension: Secondary | ICD-10-CM | POA: Diagnosis not present

## 2018-03-05 DIAGNOSIS — Z113 Encounter for screening for infections with a predominantly sexual mode of transmission: Secondary | ICD-10-CM

## 2018-03-05 DIAGNOSIS — I2510 Atherosclerotic heart disease of native coronary artery without angina pectoris: Secondary | ICD-10-CM | POA: Diagnosis not present

## 2018-03-05 DIAGNOSIS — D582 Other hemoglobinopathies: Secondary | ICD-10-CM | POA: Diagnosis not present

## 2018-03-05 DIAGNOSIS — B182 Chronic viral hepatitis C: Secondary | ICD-10-CM

## 2018-03-05 MED ORDER — GUAIFENESIN ER 600 MG PO TB12: 600.0000 mg | ORAL_TABLET | Freq: Two times a day (BID) | ORAL | 0 refills | Status: DC

## 2018-03-05 MED ORDER — LOSARTAN POTASSIUM 25 MG PO TABS: 25.0000 mg | ORAL_TABLET | Freq: Every day | ORAL | 0 refills | Status: DC

## 2018-03-05 MED ORDER — METOPROLOL SUCCINATE ER 25 MG PO TB24: 25.0000 mg | ORAL_TABLET | Freq: Every day | ORAL | 3 refills | Status: DC

## 2018-03-05 NOTE — Assessment & Plan Note (Signed)
asymptomatic

## 2018-03-05 NOTE — Assessment & Plan Note (Signed)
Urged smoking cessation 
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
## 2018-03-05 NOTE — Assessment & Plan Note (Signed)
Check liver enzymes 

## 2018-03-05 NOTE — Patient Instructions (Addendum)
Start the new blood pressure pill  Try to follow the DASH guidelines (DASH stands for Dietary Approaches to Stop Hypertension). Try to limit the sodium in your diet to no more than 1,500mg  of sodium per day. Certainly try to not exceed 2,000 mg per day at the very most. Do not add salt when cooking or at the table.  Check the sodium amount on labels when shopping, and choose items lower in sodium when given a choice. Avoid or limit foods that already contain a lot of sodium. Eat a diet rich in fruits and vegetables and whole grains, and try to lose weight if overweight or obese   DASH Eating Plan DASH stands for "Dietary Approaches to Stop Hypertension." The DASH eating plan is a healthy eating plan that has been shown to reduce high blood pressure (hypertension). It may also reduce your risk for type 2 diabetes, heart disease, and stroke. The DASH eating plan may also help with weight loss. What are tips for following this plan? General guidelines  Avoid eating more than 2,300 mg (milligrams) of salt (sodium) a day. If you have hypertension, you may need to reduce your sodium intake to 1,500 mg a day.  Limit alcohol intake to no more than 1 drink a day for nonpregnant women and 2 drinks a day for men. One drink equals 12 oz of beer, 5 oz of wine, or 1 oz of hard liquor.  Work with your health care provider to maintain a healthy body weight or to lose weight. Ask what an ideal weight is for you.  Get at least 30 minutes of exercise that causes your heart to beat faster (aerobic exercise) most days of the week. Activities may include walking, swimming, or biking.  Work with your health care provider or diet and nutrition specialist (dietitian) to adjust your eating plan to your individual calorie needs. Reading food labels  Check food labels for the amount of sodium per serving. Choose foods with less than 5 percent of the Daily Value of sodium. Generally, foods with less than 300 mg of sodium  per serving fit into this eating plan.  To find whole grains, look for the word "whole" as the first word in the ingredient list. Shopping  Buy products labeled as "low-sodium" or "no salt added."  Buy fresh foods. Avoid canned foods and premade or frozen meals. Cooking  Avoid adding salt when cooking. Use salt-free seasonings or herbs instead of table salt or sea salt. Check with your health care provider or pharmacist before using salt substitutes.  Do not fry foods. Cook foods using healthy methods such as baking, boiling, grilling, and broiling instead.  Cook with heart-healthy oils, such as olive, canola, soybean, or sunflower oil. Meal planning   Eat a balanced diet that includes: ? 5 or more servings of fruits and vegetables each day. At each meal, try to fill half of your plate with fruits and vegetables. ? Up to 6-8 servings of whole grains each day. ? Less than 6 oz of lean meat, poultry, or fish each day. A 3-oz serving of meat is about the same size as a deck of cards. One egg equals 1 oz. ? 2 servings of low-fat dairy each day. ? A serving of nuts, seeds, or beans 5 times each week. ? Heart-healthy fats. Healthy fats called Omega-3 fatty acids are found in foods such as flaxseeds and coldwater fish, like sardines, salmon, and mackerel.  Limit how much you eat of the following: ?  Canned or prepackaged foods. ? Food that is high in trans fat, such as fried foods. ? Food that is high in saturated fat, such as fatty meat. ? Sweets, desserts, sugary drinks, and other foods with added sugar. ? Full-fat dairy products.  Do not salt foods before eating.  Try to eat at least 2 vegetarian meals each week.  Eat more home-cooked food and less restaurant, buffet, and fast food.  When eating at a restaurant, ask that your food be prepared with less salt or no salt, if possible. What foods are recommended? The items listed may not be a complete list. Talk with your dietitian  about what dietary choices are best for you. Grains Whole-grain or whole-wheat bread. Whole-grain or whole-wheat pasta. Brown rice. [place]row. Bulgur. Whole-grain and low-sodium cereals. Pita bread. Low-fat, low-sodium crackers. Whole-wheat flour tortillas. Vegetables Fresh or frozen vegetables (raw, steamed, roasted, or grilled). Low-sodium or reduced-sodium tomato and vegetable juice. Low-sodium or reduced-sodium tomato sauce and tomato paste. Low-sodium or reduced-sodium canned vegetables. Fruits All fresh, dried, or frozen fruit. Canned fruit in natural juice (without added sugar). Meat and other protein foods Skinless chicken or [place]. Ground chicken or [place]. Pork with fat trimmed off. Fish and seafood. Egg whites. Dried beans, peas, or lentils. Unsalted nuts, nut butters, and seeds. Unsalted canned beans. Lean cuts of beef with fat trimmed off. Low-sodium, lean deli meat. Dairy Low-fat (1%) or fat-free (skim) milk. Fat-free, low-fat, or reduced-fat cheeses. Nonfat, low-sodium ricotta or cottage cheese. Low-fat or nonfat yogurt. Low-fat, low-sodium cheese. Fats and oils Soft margarine without trans fats. Vegetable oil. Low-fat, reduced-fat, or light mayonnaise and salad dressings (reduced-sodium). Canola, safflower, olive, soybean, and sunflower oils. Avocado. Seasoning and other foods Herbs. Spices. Seasoning mixes without salt. Unsalted popcorn and pretzels. Fat-free sweets. What foods are not recommended? The items listed may not be a complete list. Talk with your dietitian about what dietary choices are best for you. Grains Baked goods made with fat, such as croissants, muffins, or some breads. Dry pasta or rice meal packs. Vegetables Creamed or fried vegetables. Vegetables in a cheese sauce. Regular canned vegetables (not low-sodium or reduced-sodium). Regular canned tomato sauce and paste (not low-sodium or reduced-sodium). Regular tomato and vegetable juice (not low-sodium or  reduced-sodium). Angie Fava. Olives. Fruits Canned fruit in a light or heavy syrup. Fried fruit. Fruit in cream or butter sauce. Meat and other protein foods Fatty cuts of meat. Ribs. Fried meat. Berniece Salines. Sausage. Bologna and other processed lunch meats. Salami. Fatback. Hotdogs. Bratwurst. Salted nuts and seeds. Canned beans with added salt. Canned or smoked fish. Whole eggs or egg yolks. Chicken or [place] with skin. Dairy Whole or 2% milk, cream, and half-and-half. Whole or full-fat cream cheese. Whole-fat or sweetened yogurt. Full-fat cheese. Nondairy creamers. Whipped toppings. Processed cheese and cheese spreads. Fats and oils Butter. Stick margarine. Lard. Shortening. Ghee. Bacon fat. Tropical oils, such as coconut, palm kernel, or palm oil. Seasoning and other foods Salted popcorn and pretzels. Onion salt, garlic salt, seasoned salt, table salt, and sea salt. Worcestershire sauce. Tartar sauce. Barbecue sauce. Teriyaki sauce. Soy sauce, including reduced-sodium. Steak sauce. Canned and packaged gravies. Fish sauce. Oyster sauce. Cocktail sauce. Horseradish that you find on the shelf. Ketchup. Mustard. Meat flavorings and tenderizers. Bouillon cubes. Hot sauce and Tabasco sauce. Premade or packaged marinades. Premade or packaged taco seasonings. Relishes. Regular salad dressings. Where to find more information:  National Heart, Lung, and Reading: https://wilson-eaton.com/  American Heart Association: www.heart.org Summary  The DASH eating plan is a healthy eating plan that has been shown to reduce high blood pressure (hypertension). It may also reduce your risk for type 2 diabetes, heart disease, and stroke.  With the DASH eating plan, you should limit salt (sodium) intake to 2,300 mg a day. If you have hypertension, you may need to reduce your sodium intake to 1,500 mg a day.  When on the DASH eating plan, aim to eat more fresh fruits and vegetables, whole grains, lean proteins, low-fat  dairy, and heart-healthy fats.  Work with your health care provider or diet and nutrition specialist (dietitian) to adjust your eating plan to your individual calorie needs. This information is not intended to replace advice given to you by your health care provider. Make sure you discuss any questions you have with your health care provider. Document Released: 09/01/2011 Document Revised: 09/05/2016 Document Reviewed: 09/05/2016 Elsevier Interactive Patient Education  [employer].

## 2018-03-05 NOTE — Assessment & Plan Note (Signed)
Check glucose and A1c 

## 2018-03-05 NOTE — Assessment & Plan Note (Signed)
Check CBC 

## 2018-03-05 NOTE — Assessment & Plan Note (Signed)
Check urine today 

## 2018-03-06 ENCOUNTER — Other Ambulatory Visit: Payer: Self-pay | Admitting: Family Medicine

## 2018-03-06 MED ORDER — ATORVASTATIN CALCIUM 20 MG PO TABS: 20.0000 mg | ORAL_TABLET | Freq: Every day | ORAL | 1 refills | Status: DC

## 2018-03-06 MED ORDER — SULFAMETHOXAZOLE-TRIMETHOPRIM 800-160 MG PO TABS: 1.0000 | ORAL_TABLET | Freq: Two times a day (BID) | ORAL | 0 refills | Status: DC

## 2018-03-06 NOTE — Progress Notes (Signed)
Bactrim ds, culture pending Start statin

## 2018-03-07 LAB — COMPLETE METABOLIC PANEL WITH GFR
AG Ratio: 1.2 (calc) (ref 1.0–2.5)
ALT: 7 U/L (ref 6–29)
AST: 11 U/L (ref 10–35)
Albumin: 4.4 g/dL (ref 3.6–5.1)
Alkaline phosphatase (APISO): 81 U/L (ref 33–130)
BUN: 11 mg/dL (ref 7–25)
CO2: 23 mmol/L (ref 20–32)
Calcium: 10 mg/dL (ref 8.6–10.4)
Chloride: 105 mmol/L (ref 98–110)
Creat: 0.87 mg/dL (ref 0.50–0.99)
GFR, Est African American: 82 mL/min/{1.73_m2} (ref >=60)
GFR, Est Non African American: 71 mL/min/{1.73_m2} (ref >=60)
Globulin: 3.7 g/dL (calc) (ref 1.9–3.7)
Glucose, Bld: 109 mg/dL (ref 65–139)
Potassium: 4.2 mmol/L (ref 3.5–5.3)
Sodium: 135 mmol/L (ref 135–146)
Total Bilirubin: 0.4 mg/dL (ref 0.2–1.2)
Total Protein: 8.1 g/dL (ref 6.1–8.1)

## 2018-03-07 LAB — HEMOGLOBIN A1C
Hgb A1c MFr Bld: 5 % of total Hgb (ref <5.7)
Mean Plasma Glucose: 97 (calc)
eAG (mmol/L): 5.4 (calc)

## 2018-03-07 LAB — LIPID PANEL
Cholesterol: 204 mg/dL — ABNORMAL HIGH (ref <200)
HDL: 46 mg/dL — ABNORMAL LOW (ref >50)
LDL Cholesterol (Calc): 122 mg/dL (calc) — ABNORMAL HIGH
Non-HDL Cholesterol (Calc): 158 mg/dL (calc) — ABNORMAL HIGH (ref <130)
Total CHOL/HDL Ratio: 4.4 (calc) (ref <5.0)
Triglycerides: 249 mg/dL — ABNORMAL HIGH (ref <150)

## 2018-03-07 LAB — URINALYSIS W MICROSCOPIC + REFLEX CULTURE
Bilirubin Urine: NEGATIVE
Glucose, UA: NEGATIVE
Ketones, ur: NEGATIVE
Nitrites, Initial: POSITIVE — AB
Specific Gravity, Urine: 1.014 (ref 1.001–1.03)
WBC, UA: >=60 /HPF — AB (ref 0–5)
pH: 7 (ref 5.0–8.0)

## 2018-03-07 LAB — CBC WITH DIFFERENTIAL/PLATELET
Basophils Absolute: 128 cells/uL (ref 0–200)
Basophils Relative: 2 %
Eosinophils Absolute: 160 cells/uL (ref 15–500)
Eosinophils Relative: 2.5 %
HCT: 47.7 % — ABNORMAL HIGH (ref 35.0–45.0)
Hemoglobin: 16.1 g/dL — ABNORMAL HIGH (ref 11.7–15.5)
Lymphs Abs: 1888 cells/uL (ref 850–3900)
MCH: 29.5 pg (ref 27.0–33.0)
MCHC: 33.8 g/dL (ref 32.0–36.0)
MCV: 87.4 fL (ref 80.0–100.0)
MPV: 10.1 fL (ref 7.5–12.5)
Monocytes Relative: 5 %
Neutro Abs: 3904 cells/uL (ref 1500–7800)
Neutrophils Relative %: 61 %
Platelets: 177 10*3/uL (ref 140–400)
RBC: 5.46 10*6/uL — ABNORMAL HIGH (ref 3.80–5.10)
RDW: 12.8 % (ref 11.0–15.0)
Total Lymphocyte: 29.5 %
WBC mixed population: 320 cells/uL (ref 200–950)
WBC: 6.4 10*3/uL (ref 3.8–10.8)

## 2018-03-07 LAB — HEPATITIS C RNA QUANTITATIVE
HCV Quantitative Log: <1.18 Log IU/mL
HCV RNA, PCR, QN: <15 IU/mL

## 2018-03-07 LAB — URINE CULTURE
MICRO NUMBER:: 90698800
SPECIMEN QUALITY:: ADEQUATE

## 2018-03-07 LAB — RPR: RPR Ser Ql: NONREACTIVE

## 2018-03-07 LAB — C. TRACHOMATIS/N. GONORRHOEAE RNA
C. trachomatis RNA, TMA: NOT DETECTED
N. gonorrhoeae RNA, TMA: NOT DETECTED

## 2018-03-07 LAB — CULTURE INDICATED

## 2018-03-07 LAB — HIV ANTIBODY (ROUTINE TESTING W REFLEX): HIV 1&2 Ab, 4th Generation: NONREACTIVE

## 2018-03-07 LAB — HEPATITIS B SURFACE ANTIBODY,QUALITATIVE: Hep B S Ab: NONREACTIVE

## 2018-03-07 LAB — HEPATITIS B SURFACE ANTIGEN: Hepatitis B Surface Ag: NONREACTIVE

## 2018-03-08 ENCOUNTER — Other Ambulatory Visit: Payer: Self-pay | Admitting: Psychiatry

## 2018-03-08 DIAGNOSIS — F3160 Bipolar disorder, current episode mixed, unspecified: Secondary | ICD-10-CM

## 2018-03-15 ENCOUNTER — Other Ambulatory Visit: Payer: Self-pay | Admitting: Family Medicine

## 2018-03-15 ENCOUNTER — Telehealth: Payer: Self-pay

## 2018-03-15 NOTE — Telephone Encounter (Signed)
Patients husband called requesting a refill on Seroquel 50mg  tabs. Next appointment is scheduled for 04/02/18. Please advise

## 2018-03-18 NOTE — Assessment & Plan Note (Signed)
Adjust meds, return in one week; work on stress levels

## 2018-03-20 MED ORDER — QUETIAPINE FUMARATE 50 MG PO TABS: 50.0000 mg | ORAL_TABLET | Freq: Three times a day (TID) | ORAL | 0 refills | Status: DC

## 2018-03-20 MED ORDER — LITHIUM CARBONATE 300 MG PO CAPS: 300.0000 mg | ORAL_CAPSULE | Freq: Two times a day (BID) | ORAL | 0 refills | Status: DC

## 2018-03-20 NOTE — Telephone Encounter (Signed)
pt husband called left message that patient needs refills on medication.

## 2018-03-20 NOTE — Telephone Encounter (Signed)
Received refill request. Sent 15 days supply to pharmacy. Pt has appointment scheduled July 8 th .

## 2018-03-22 ENCOUNTER — Other Ambulatory Visit: Payer: Self-pay | Admitting: Psychiatry

## 2018-03-23 ENCOUNTER — Other Ambulatory Visit: Payer: Self-pay | Admitting: Family Medicine

## 2018-03-23 NOTE — Telephone Encounter (Signed)
Too soon for losartan 30 day supply just written on 03/05/18

## 2018-04-02 ENCOUNTER — Ambulatory Visit: Payer: Medicaid Other | Admitting: Psychiatry

## 2018-04-02 ENCOUNTER — Encounter: Payer: Self-pay | Admitting: Psychiatry

## 2018-04-02 ENCOUNTER — Ambulatory Visit (INDEPENDENT_AMBULATORY_CARE_PROVIDER_SITE_OTHER): Payer: Medicaid Other | Admitting: Psychiatry

## 2018-04-02 ENCOUNTER — Other Ambulatory Visit: Payer: Self-pay

## 2018-04-02 VITALS — BP 181/97 | HR 87 | Temp 98.2°F | Wt 123.8 lb

## 2018-04-02 DIAGNOSIS — F1210 Cannabis abuse, uncomplicated: Secondary | ICD-10-CM | POA: Diagnosis not present

## 2018-04-02 DIAGNOSIS — F3160 Bipolar disorder, current episode mixed, unspecified: Secondary | ICD-10-CM | POA: Diagnosis not present

## 2018-04-02 MED ORDER — LITHIUM CARBONATE 300 MG PO CAPS: 300.0000 mg | ORAL_CAPSULE | Freq: Two times a day (BID) | ORAL | 2 refills | Status: DC

## 2018-04-02 MED ORDER — OXCARBAZEPINE 300 MG PO TABS: 300.0000 mg | ORAL_TABLET | Freq: Every day | ORAL | 1 refills | Status: DC

## 2018-04-02 MED ORDER — AMANTADINE HCL 100 MG PO CAPS: 100.0000 mg | ORAL_CAPSULE | Freq: Every day | ORAL | 2 refills | Status: DC

## 2018-04-02 MED ORDER — GABAPENTIN 300 MG PO CAPS: 300.0000 mg | ORAL_CAPSULE | Freq: Every day | ORAL | 2 refills | Status: DC

## 2018-04-02 MED ORDER — QUETIAPINE FUMARATE 50 MG PO TABS: 50.0000 mg | ORAL_TABLET | Freq: Three times a day (TID) | ORAL | 2 refills | Status: DC
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
## 2018-04-03 ENCOUNTER — Telehealth: Payer: Self-pay | Admitting: Family Medicine

## 2018-04-03 NOTE — Telephone Encounter (Signed)
Patient needs an appt for her hypertension and abd symptoms; see last note I approved her losartan but she needs appt with me or NP

## 2018-04-03 NOTE — Telephone Encounter (Signed)
Spoke with patient husband and scheduled her an appt for tomorrow with Benjamine Mola

## 2018-04-04 ENCOUNTER — Encounter: Payer: Self-pay | Admitting: Nurse Practitioner

## 2018-04-04 ENCOUNTER — Ambulatory Visit: Payer: Medicaid Other | Admitting: Nurse Practitioner

## 2018-04-04 VITALS — BP 170/90 | HR 83 | Temp 98.4°F | Resp 16 | Ht 66.0 in | Wt 123.5 lb

## 2018-04-04 DIAGNOSIS — E7849 Other hyperlipidemia: Secondary | ICD-10-CM

## 2018-04-04 DIAGNOSIS — J449 Chronic obstructive pulmonary disease, unspecified: Secondary | ICD-10-CM | POA: Diagnosis not present

## 2018-04-04 DIAGNOSIS — E782 Mixed hyperlipidemia: Secondary | ICD-10-CM | POA: Insufficient documentation

## 2018-04-04 DIAGNOSIS — Z87891 Personal history of nicotine dependence: Secondary | ICD-10-CM

## 2018-04-04 DIAGNOSIS — Z5181 Encounter for therapeutic drug level monitoring: Secondary | ICD-10-CM

## 2018-04-04 DIAGNOSIS — I10 Essential (primary) hypertension: Secondary | ICD-10-CM | POA: Diagnosis not present

## 2018-04-04 MED ORDER — ALBUTEROL SULFATE (2.5 MG/3ML) 0.083% IN NEBU
2.5000 mg | INHALATION_SOLUTION | Freq: Once | RESPIRATORY_TRACT | Status: AC
  Administered 2018-04-04: 2.5 mg via RESPIRATORY_TRACT

## 2018-04-04 MED ORDER — ATORVASTATIN CALCIUM 20 MG PO TABS: 20.0000 mg | ORAL_TABLET | Freq: Every day | ORAL | 0 refills | Status: DC

## 2018-04-04 MED ORDER — VITAMIN C 500 MG PO TABS: 500.0000 mg | ORAL_TABLET | Freq: Every day | ORAL | 3 refills | Status: DC

## 2018-04-04 MED ORDER — LOSARTAN POTASSIUM 25 MG PO TABS: 25.0000 mg | ORAL_TABLET | Freq: Every day | ORAL | 0 refills | Status: DC

## 2018-04-04 NOTE — Patient Instructions (Addendum)
- please take atorvastatin (lipitor) every night before bed.  - Please take the 10mg  of amlodipine and 25mg  of losartan once every day.  - Will come back in 2 weeks to recheck blood pressure and kidney  function.  - Please call pulmonologist to make an appointment; and please work on reducing how much you are smoking.  - If you have severe chest pain, dizziness, weakness on one side of your body, blurry vision please go to the ER  Coping with Quitting Smoking Quitting smoking is a physical and mental challenge. You will face cravings, withdrawal symptoms, and temptation. Before quitting, work with your health care provider to make a plan that can help you cope. Preparation can help you quit and keep you from giving in. How can I cope with cravings? Cravings usually last for 5-10 minutes. If you get through it, the craving will pass. Consider taking the following actions to help you cope with cravings:  Keep your mouth busy: ? Chew sugar-free gum. ? Suck on hard candies or a straw. ? Brush your teeth.  Keep your hands and body busy: ? Immediately change to a different activity when you feel a craving. ? Squeeze or play with a ball. ? Do an activity or a hobby, like making bead jewelry, practicing needlepoint, or working with wood. ? Mix up your normal routine. ? Take a short exercise break. Go for a quick walk or run up and down stairs. ? Spend time in public places where smoking is not allowed.  Focus on doing something kind or helpful for someone else.  Call a friend or family member to talk during a craving.  Join a support group.  Call a quit line, such as 1-800-QUIT-NOW.  Talk with your health care provider about medicines that might help you cope with cravings and make quitting easier for you.  How can I deal with withdrawal symptoms? Your body may experience negative effects as it tries to get used to not having nicotine in the system. These effects are called withdrawal  symptoms. They may include:  Feeling hungrier than normal.  Trouble concentrating.  Irritability.  Trouble sleeping.  Feeling depressed.  Restlessness and agitation.  Craving a cigarette.  To manage withdrawal symptoms:  Avoid places, people, and activities that trigger your cravings.  Remember why you want to quit.  Get plenty of sleep.  Avoid coffee and other caffeinated drinks. These may worsen some of your symptoms.  How can I handle social situations? Social situations can be difficult when you are quitting smoking, especially in the first few weeks. To manage this, you can:  Avoid parties, bars, and other social situations where people might be smoking.  Avoid alcohol.  Leave right away if you have the urge to smoke.  Explain to your family and friends that you are quitting smoking. Ask for understanding and support.  Plan activities with friends or family where smoking is not an option.  What are some ways I can cope with stress? Wanting to smoke may cause stress, and stress can make you want to smoke. Find ways to manage your stress. Relaxation techniques can help. For example:  Breathe slowly and deeply, in through your nose and out through your mouth.  Listen to soothing, relaxing music.  Talk with a family member or friend about your stress.  Light a candle.  Soak in a bath or take a shower.  Think about a peaceful place.  What are some ways I can prevent  weight gain? Be aware that many people gain weight after they quit smoking. However, not everyone does. To keep from gaining weight, have a plan in place before you quit and stick to the plan after you quit. Your plan should include:  Having healthy snacks. When you have a craving, it may help to: ? Eat plain popcorn, crunchy carrots, celery, or other cut vegetables. ? Chew sugar-free gum.  Changing how you eat: ? Eat small portion sizes at meals. ? Eat 4-6 small meals throughout the day  instead of 1-2 large meals a day. ? Be mindful when you eat. Do not watch television or do other things that might distract you as you eat.  Exercising regularly: ? Make time to exercise each day. If you do not have time for a long workout, do short bouts of exercise for 5-10 minutes several times a day. ? Do some form of strengthening exercise, like weight lifting, and some form of aerobic exercise, like running or swimming.  Drinking plenty of water or other low-calorie or no-calorie drinks. Drink 6-8 glasses of water daily, or as much as instructed by your health care provider.  Summary  Quitting smoking is a physical and mental challenge. You will face cravings, withdrawal symptoms, and temptation to smoke again. Preparation can help you as you go through these challenges.  You can cope with cravings by keeping your mouth busy (such as by chewing gum), keeping your body and hands busy, and making calls to family, friends, or a helpline for people who want to quit smoking.  You can cope with withdrawal symptoms by avoiding places where people smoke, avoiding drinks with caffeine, and getting plenty of rest.  Ask your health care provider about the different ways to prevent weight gain, avoid stress, and handle social situations. This information is not intended to replace advice given to you by your health care provider. Make sure you discuss any questions you have with your health care provider. Document Released: 09/09/2016 Document Revised: 09/09/2016 Document Reviewed: 09/09/2016 Elsevier Interactive Patient Education  [employer].
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
## 2018-04-05 LAB — BASIC METABOLIC PANEL WITH GFR
BUN: 15 mg/dL (ref 7–25)
CO2: 25 mmol/L (ref 20–32)
Calcium: 10.1 mg/dL (ref 8.6–10.4)
Chloride: 104 mmol/L (ref 98–110)
Creat: 0.97 mg/dL (ref 0.50–0.99)
GFR, Est African American: 72 mL/min/{1.73_m2} (ref >=60)
GFR, Est Non African American: 62 mL/min/{1.73_m2} (ref >=60)
Glucose, Bld: 112 mg/dL — ABNORMAL HIGH (ref 65–99)
Potassium: 4.1 mmol/L (ref 3.5–5.3)
Sodium: 137 mmol/L (ref 135–146)

## 2018-04-17 ENCOUNTER — Ambulatory Visit: Payer: Self-pay | Admitting: Family Medicine

## 2018-04-20 ENCOUNTER — Encounter: Payer: Self-pay | Admitting: Family Medicine

## 2018-04-20 ENCOUNTER — Ambulatory Visit: Payer: Medicaid Other | Admitting: Family Medicine

## 2018-04-20 DIAGNOSIS — J449 Chronic obstructive pulmonary disease, unspecified: Secondary | ICD-10-CM | POA: Diagnosis not present

## 2018-04-20 DIAGNOSIS — F3170 Bipolar disorder, currently in remission, most recent episode unspecified: Secondary | ICD-10-CM

## 2018-04-20 DIAGNOSIS — I10 Essential (primary) hypertension: Secondary | ICD-10-CM

## 2018-04-20 MED ORDER — BLOOD PRESSURE MONITOR DEVI: 0 refills | Status: DC

## 2018-04-20 MED ORDER — LOSARTAN POTASSIUM 50 MG PO TABS: 50.0000 mg | ORAL_TABLET | Freq: Every day | ORAL | 0 refills | Status: DC
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
## 2018-04-20 NOTE — Assessment & Plan Note (Signed)
Suggested that she contact her pulmonologist to see whether or not a nebulizer machine would be worthwhile

## 2018-04-20 NOTE — Assessment & Plan Note (Signed)
Not controlled; increase ARB; return in 1 week for recheck and BMP; BP monitor prescribed

## 2018-04-20 NOTE — Assessment & Plan Note (Signed)
Ups and downs, patient denies SI/HI

## 2018-04-20 NOTE — Patient Instructions (Addendum)
Continue the amlodipine 10 mg daily Increase the losartan from 25 mg daily to 50 mg daily Try to follow the DASH guidelines (DASH stands for Dietary Approaches to Stop Hypertension). Try to limit the sodium in your diet to no more than 1,500mg  of sodium per day. Certainly try to not exceed 2,000 mg per day at the very most. Do not add salt when cooking or at the table.  Check the sodium amount on labels when shopping, and choose items lower in sodium when given a choice. Avoid or limit foods that already contain a lot of sodium. Eat a diet rich in fruits and vegetables and whole grains, and try to lose weight if overweight or obese

## 2018-04-27 ENCOUNTER — Ambulatory Visit: Payer: Self-pay | Admitting: Family Medicine

## 2018-04-27 ENCOUNTER — Other Ambulatory Visit: Payer: Self-pay | Admitting: Family Medicine

## 2018-04-27 ENCOUNTER — Other Ambulatory Visit: Payer: Self-pay | Admitting: Nurse Practitioner

## 2018-04-27 DIAGNOSIS — I10 Essential (primary) hypertension: Secondary | ICD-10-CM

## 2018-05-19 ENCOUNTER — Other Ambulatory Visit: Payer: Self-pay | Admitting: Psychiatry

## 2018-05-19 ENCOUNTER — Other Ambulatory Visit: Payer: Self-pay | Admitting: Family Medicine

## 2018-05-19 DIAGNOSIS — E7849 Other hyperlipidemia: Secondary | ICD-10-CM

## 2018-05-19 MED ORDER — ATORVASTATIN CALCIUM 20 MG PO TABS: 20.0000 mg | ORAL_TABLET | Freq: Every day | ORAL | 3 refills | Status: DC

## 2018-05-21 ENCOUNTER — Other Ambulatory Visit: Payer: Self-pay | Admitting: Psychiatry

## 2018-05-21 DIAGNOSIS — F3160 Bipolar disorder, current episode mixed, unspecified: Secondary | ICD-10-CM

## 2018-05-21 MED ORDER — OXCARBAZEPINE 300 MG PO TABS: 300.0000 mg | ORAL_TABLET | Freq: Every day | ORAL | 1 refills | Status: DC

## 2018-05-21 MED ORDER — LITHIUM CARBONATE 300 MG PO CAPS: 300.0000 mg | ORAL_CAPSULE | Freq: Two times a day (BID) | ORAL | 2 refills | Status: DC

## 2018-05-27 ENCOUNTER — Other Ambulatory Visit: Payer: Self-pay | Admitting: Psychiatry

## 2018-06-04 ENCOUNTER — Ambulatory Visit: Payer: Medicaid Other | Admitting: Psychiatry

## 2018-06-21 ENCOUNTER — Ambulatory Visit: Payer: Medicaid Other | Admitting: Family Medicine

## 2018-06-21 ENCOUNTER — Encounter: Payer: Self-pay | Admitting: Family Medicine

## 2018-06-21 VITALS — BP 128/70 | HR 96 | Temp 98.9°F | Ht 66.0 in | Wt 131.1 lb

## 2018-06-21 DIAGNOSIS — Z594 Lack of adequate food and safe drinking water: Secondary | ICD-10-CM

## 2018-06-21 DIAGNOSIS — I10 Essential (primary) hypertension: Secondary | ICD-10-CM

## 2018-06-21 DIAGNOSIS — B182 Chronic viral hepatitis C: Secondary | ICD-10-CM | POA: Diagnosis not present

## 2018-06-21 DIAGNOSIS — Z1239 Encounter for other screening for malignant neoplasm of breast: Secondary | ICD-10-CM

## 2018-06-21 DIAGNOSIS — Z23 Encounter for immunization: Secondary | ICD-10-CM | POA: Diagnosis not present

## 2018-06-21 DIAGNOSIS — J449 Chronic obstructive pulmonary disease, unspecified: Secondary | ICD-10-CM

## 2018-06-21 DIAGNOSIS — I441 Atrioventricular block, second degree: Secondary | ICD-10-CM | POA: Diagnosis not present

## 2018-06-21 DIAGNOSIS — D582 Other hemoglobinopathies: Secondary | ICD-10-CM

## 2018-06-21 DIAGNOSIS — R739 Hyperglycemia, unspecified: Secondary | ICD-10-CM

## 2018-06-21 DIAGNOSIS — I2510 Atherosclerotic heart disease of native coronary artery without angina pectoris: Secondary | ICD-10-CM

## 2018-06-21 DIAGNOSIS — E7849 Other hyperlipidemia: Secondary | ICD-10-CM

## 2018-06-21 DIAGNOSIS — Z5941 Food insecurity: Secondary | ICD-10-CM

## 2018-06-21 DIAGNOSIS — Z1231 Encounter for screening mammogram for malignant neoplasm of breast: Secondary | ICD-10-CM

## 2018-06-21 DIAGNOSIS — Z9189 Other specified personal risk factors, not elsewhere classified: Secondary | ICD-10-CM | POA: Diagnosis not present

## 2018-06-21 NOTE — Assessment & Plan Note (Signed)
Controlled today with two agents and doing well

## 2018-06-21 NOTE — Assessment & Plan Note (Signed)
Recheck quant today per GI suggestion

## 2018-06-21 NOTE — Assessment & Plan Note (Signed)
Check CBC 

## 2018-06-21 NOTE — Assessment & Plan Note (Signed)
Managed by cardiologist 

## 2018-06-21 NOTE — Patient Instructions (Addendum)
We'll have someone call you about food insecurity and exercise Please call Dr. Alva Garnet about your breathing: Phone: [phone]  I am here to help if and when you want to quit smoking Call [phone] to sign up for smoking cessation classes You can call 1-800-QUIT-NOW to talk with a smoking cessation coach   Health Risks of Smoking Smoking cigarettes is very bad for your health. Tobacco smoke has over 200 known poisons in it. It contains the poisonous gases nitrogen oxide and carbon monoxide. There are over 60 chemicals in tobacco smoke that cause cancer. Smoking is difficult to quit because a chemical in tobacco, called nicotine, causes addiction or dependence. When you smoke and inhale, nicotine is absorbed rapidly into the bloodstream through your lungs. Both inhaled and non-inhaled nicotine may be addictive. What are the risks of cigarette smoke? Cigarette smokers have an increased risk of many serious medical problems, including:  Lung cancer.  Lung disease, such as pneumonia, bronchitis, and emphysema.  Chest pain (angina) and heart attack because the heart is not getting enough oxygen.  Heart disease and peripheral blood vessel disease.  High blood pressure (hypertension).  Stroke.  Oral cancer, including cancer of the lip, mouth, or voice box.  Bladder cancer.  Pancreatic cancer.  Cervical cancer.  Pregnancy complications, including premature birth.  Stillbirths and smaller newborn babies, birth defects, and genetic damage to sperm.  Early menopause.  Lower estrogen level for women.  Infertility.  Facial wrinkles.  Blindness.  Increased risk of broken bones (fractures).  Senile dementia.  Stomach ulcers and internal bleeding.  Delayed wound healing and increased risk of complications during surgery.  Even smoking lightly shortens your life expectancy by several years.  Because of secondhand smoke exposure, children of smokers have an increased risk  of the following:  Sudden infant death syndrome (SIDS).  Respiratory infections.  Lung cancer.  Heart disease.  Ear infections.  What are the benefits of quitting? There are many health benefits of quitting smoking. Here are some of them:  Within days of quitting smoking, your risk of having a heart attack decreases, your blood flow improves, and your lung capacity improves. Blood pressure, pulse rate, and breathing patterns start returning to normal soon after quitting.  Within months, your lungs may clear up completely.  Quitting for 10 years reduces your risk of developing lung cancer and heart disease to almost that of a nonsmoker.  People who quit may see an improvement in their overall quality of life.  How do I quit smoking? Smoking is an addiction with both physical and psychological effects, and longtime habits can be hard to change. Your health care provider can recommend:  Programs and community resources, which may include group support, education, or talk therapy.  Prescription medicines to help reduce cravings.  Nicotine replacement products, such as patches, gum, and nasal sprays. Use these products only as directed. Do not replace cigarette smoking with electronic cigarettes, which are commonly called e-cigarettes. The safety of e-cigarettes is not known, and some may contain harmful chemicals.  A combination of two or more of these methods.  Where to find more information:  American Lung Association: www.lung.org  American Cancer Society: www.cancer.org Summary  Smoking cigarettes is very bad for your health. Cigarette smokers have an increased risk of many serious medical problems, including several cancers, heart disease, and stroke.  Smoking is an addiction with both physical and psychological effects, and longtime habits can be hard to change.  By stopping right away, you  can greatly reduce the risk of medical problems for you and your family.  To  help you quit smoking, your health care provider can recommend programs, community resources, prescription medicines, and nicotine replacement products such as patches, gum, and nasal sprays. This information is not intended to replace advice given to you by your health care provider. Make sure you discuss any questions you have with your health care provider. Document Released: 10/20/2004 Document Revised: 09/16/2016 Document Reviewed: 09/16/2016 Elsevier Interactive Patient Education  2017 [employer].
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
## 2018-06-21 NOTE — Assessment & Plan Note (Signed)
No chest pain; goal LDL less than 70; continue aspirin

## 2018-06-21 NOTE — Assessment & Plan Note (Signed)
Check lipids 

## 2018-06-21 NOTE — Assessment & Plan Note (Signed)
A1c was excellent

## 2018-06-21 NOTE — Assessment & Plan Note (Signed)
Managed by Dr. Alva Garnet; husband was asking about nebulizer and I'll defer that to her lung doctor to decide and manage; patient is not ready to quit smoking right now and I appreciate her honesty; she was reminded that ongoing smoking will likely shorten lifespan

## 2018-06-25 LAB — COMPLETE METABOLIC PANEL WITH GFR
AG Ratio: 1.3 (calc) (ref 1.0–2.5)
ALT: 9 U/L (ref 6–29)
AST: 15 U/L (ref 10–35)
Albumin: 4 g/dL (ref 3.6–5.1)
Alkaline phosphatase (APISO): 63 U/L (ref 33–130)
BUN/Creatinine Ratio: 14 (calc) (ref 6–22)
BUN: 14 mg/dL (ref 7–25)
CO2: 26 mmol/L (ref 20–32)
Calcium: 9.7 mg/dL (ref 8.6–10.4)
Chloride: 107 mmol/L (ref 98–110)
Creat: 1.03 mg/dL — ABNORMAL HIGH (ref 0.50–0.99)
GFR, Est African American: 67 mL/min/{1.73_m2} (ref >=60)
GFR, Est Non African American: 58 mL/min/{1.73_m2} — ABNORMAL LOW (ref >=60)
Globulin: 3.1 g/dL (calc) (ref 1.9–3.7)
Glucose, Bld: 111 mg/dL (ref 65–139)
Potassium: 3.9 mmol/L (ref 3.5–5.3)
Sodium: 138 mmol/L (ref 135–146)
Total Bilirubin: 0.4 mg/dL (ref 0.2–1.2)
Total Protein: 7.1 g/dL (ref 6.1–8.1)

## 2018-06-25 LAB — CBC WITH DIFFERENTIAL/PLATELET
Basophils Absolute: 91 cells/uL (ref 0–200)
Basophils Relative: 1.4 %
Eosinophils Absolute: 189 cells/uL (ref 15–500)
Eosinophils Relative: 2.9 %
HCT: 40.6 % (ref 35.0–45.0)
Hemoglobin: 13.5 g/dL (ref 11.7–15.5)
Lymphs Abs: 2197 cells/uL (ref 850–3900)
MCH: 30.8 pg (ref 27.0–33.0)
MCHC: 33.3 g/dL (ref 32.0–36.0)
MCV: 92.5 fL (ref 80.0–100.0)
MPV: 10.6 fL (ref 7.5–12.5)
Monocytes Relative: 4.9 %
Neutro Abs: 3705 cells/uL (ref 1500–7800)
Neutrophils Relative %: 57 %
Platelets: 147 10*3/uL (ref 140–400)
RBC: 4.39 10*6/uL (ref 3.80–5.10)
RDW: 12.3 % (ref 11.0–15.0)
Total Lymphocyte: 33.8 %
WBC mixed population: 319 cells/uL (ref 200–950)
WBC: 6.5 10*3/uL (ref 3.8–10.8)

## 2018-06-25 LAB — LIPID PANEL
Cholesterol: 104 mg/dL (ref <200)
HDL: 47 mg/dL — ABNORMAL LOW (ref >50)
LDL Cholesterol (Calc): 32 mg/dL (calc)
Non-HDL Cholesterol (Calc): 57 mg/dL (calc) (ref <130)
Total CHOL/HDL Ratio: 2.2 (calc) (ref <5.0)
Triglycerides: 178 mg/dL — ABNORMAL HIGH (ref <150)

## 2018-06-25 LAB — HEPATITIS C RNA QUANTITATIVE
HCV Quantitative Log: <1.18 Log IU/mL
HCV RNA, PCR, QN: <15 IU/mL

## 2018-07-15 ENCOUNTER — Other Ambulatory Visit: Payer: Self-pay | Admitting: Psychiatry

## 2018-08-07 ENCOUNTER — Other Ambulatory Visit: Payer: Self-pay | Admitting: Family Medicine

## 2018-08-07 DIAGNOSIS — I10 Essential (primary) hypertension: Secondary | ICD-10-CM

## 2018-08-11 ENCOUNTER — Other Ambulatory Visit: Payer: Self-pay | Admitting: Psychiatry

## 2018-08-21 ENCOUNTER — Other Ambulatory Visit: Payer: Self-pay | Admitting: Psychiatry

## 2018-09-05 ENCOUNTER — Other Ambulatory Visit: Payer: Self-pay | Admitting: Family Medicine

## 2018-09-05 DIAGNOSIS — I10 Essential (primary) hypertension: Secondary | ICD-10-CM

## 2018-09-21 ENCOUNTER — Ambulatory Visit: Payer: Self-pay | Admitting: Family Medicine

## 2018-09-27 ENCOUNTER — Ambulatory Visit: Payer: Self-pay | Admitting: Family Medicine

## 2018-10-01 ENCOUNTER — Ambulatory Visit (HOSPITAL_BASED_OUTPATIENT_CLINIC_OR_DEPARTMENT_OTHER): Payer: Medicaid Other | Admitting: Psychiatry

## 2018-10-01 ENCOUNTER — Encounter: Payer: Self-pay | Admitting: Psychiatry

## 2018-10-01 VITALS — BP 152/83 | HR 87 | Ht 65.5 in | Wt 128.0 lb

## 2018-10-01 DIAGNOSIS — F3160 Bipolar disorder, current episode mixed, unspecified: Secondary | ICD-10-CM

## 2018-10-01 DIAGNOSIS — F1210 Cannabis abuse, uncomplicated: Secondary | ICD-10-CM

## 2018-10-01 MED ORDER — OXCARBAZEPINE 300 MG PO TABS: 300.0000 mg | ORAL_TABLET | Freq: Every day | ORAL | 1 refills | Status: DC

## 2018-10-01 MED ORDER — QUETIAPINE FUMARATE 50 MG PO TABS: 50.0000 mg | ORAL_TABLET | Freq: Three times a day (TID) | ORAL | 2 refills | Status: DC

## 2018-10-01 MED ORDER — GABAPENTIN 300 MG PO CAPS: 300.0000 mg | ORAL_CAPSULE | Freq: Every day | ORAL | 2 refills | Status: DC

## 2018-10-01 MED ORDER — AMANTADINE HCL 100 MG PO CAPS: 100.0000 mg | ORAL_CAPSULE | Freq: Every day | ORAL | 2 refills | Status: DC

## 2018-10-01 MED ORDER — LITHIUM CARBONATE 300 MG PO CAPS: 300.0000 mg | ORAL_CAPSULE | Freq: Two times a day (BID) | ORAL | 2 refills | Status: DC
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
## 2018-10-08 ENCOUNTER — Encounter: Payer: Self-pay | Admitting: Nurse Practitioner

## 2018-10-08 ENCOUNTER — Ambulatory Visit: Payer: Medicaid Other | Admitting: Nurse Practitioner

## 2018-10-08 VITALS — BP 136/82 | HR 85 | Temp 98.0°F | Resp 16 | Ht 66.0 in | Wt 133.0 lb

## 2018-10-08 DIAGNOSIS — I10 Essential (primary) hypertension: Secondary | ICD-10-CM

## 2018-10-08 DIAGNOSIS — Z720 Tobacco use: Secondary | ICD-10-CM

## 2018-10-08 DIAGNOSIS — I441 Atrioventricular block, second degree: Secondary | ICD-10-CM

## 2018-10-08 DIAGNOSIS — F3170 Bipolar disorder, currently in remission, most recent episode unspecified: Secondary | ICD-10-CM

## 2018-10-08 DIAGNOSIS — J449 Chronic obstructive pulmonary disease, unspecified: Secondary | ICD-10-CM | POA: Diagnosis not present

## 2018-10-08 DIAGNOSIS — E7849 Other hyperlipidemia: Secondary | ICD-10-CM

## 2018-10-08 MED ORDER — VITAMIN C 500 MG PO TABS: 500.0000 mg | ORAL_TABLET | Freq: Every day | ORAL | 3 refills | Status: DC

## 2018-10-08 MED ORDER — LOSARTAN POTASSIUM 50 MG PO TABS: 50.0000 mg | ORAL_TABLET | Freq: Every day | ORAL | 3 refills | Status: DC

## 2018-10-08 MED ORDER — ATORVASTATIN CALCIUM 20 MG PO TABS: 20.0000 mg | ORAL_TABLET | Freq: Every day | ORAL | 3 refills | Status: DC

## 2018-10-08 MED ORDER — AMLODIPINE BESYLATE 10 MG PO TABS: 10.0000 mg | ORAL_TABLET | Freq: Every day | ORAL | 3 refills | Status: DC
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
## 2018-10-08 NOTE — Patient Instructions (Addendum)
For headaches: First rest, drink plenty of water (at least 64 ounces a day), new pillow, deep breathing. Then take acetaminophen (tylenol) 1000mg  every 8 hours ( no more than 3,000mg  in 24 hours). If pain is still not improved in 1 hour you can take 400mg  of ibuprofen with some food on your stomach ( do not take more than 2,400mg  of ibuprofen in 24 hours). Use tylenol mainly and ibuprofen sparingly.   Continue to work on cutting down on smoking. Can take musinex to help get up congestion, and use albuterol inhaler for wheezing.

## 2018-10-24 ENCOUNTER — Telehealth: Payer: Self-pay

## 2018-10-24 NOTE — Telephone Encounter (Signed)
pt needs [facility] order for labwork that she needs to have done before her next appt.

## 2018-11-05 ENCOUNTER — Telehealth: Payer: Self-pay | Admitting: *Deleted

## 2018-11-05 DIAGNOSIS — Z87891 Personal history of nicotine dependence: Secondary | ICD-10-CM

## 2018-11-05 DIAGNOSIS — Z122 Encounter for screening for malignant neoplasm of respiratory organs: Secondary | ICD-10-CM

## 2018-11-05 NOTE — Telephone Encounter (Signed)
Patient has been notified that annual lung cancer screening low dose CT scan is due currently or will be in near future. Confirmed that patient is within the age range of 55-77, and asymptomatic, (no signs or symptoms of lung cancer). Patient denies illness that would prevent curative treatment for lung cancer if found. Verified smoking history, (current, 86 pack year). The shared decision making visit was done 11/06/15. Patient is agreeable for CT scan being scheduled.

## 2018-11-13 ENCOUNTER — Telehealth: Payer: Self-pay

## 2018-11-13 NOTE — Telephone Encounter (Signed)
pt has not had labwork done because the form you gave them for labwork was not filled out completely and the lab would not do the order.  pt spouse states he will be back monday to pick up the labform

## 2018-11-15 ENCOUNTER — Ambulatory Visit
Admission: RE | Admit: 2018-11-15 | Discharge: 2018-11-15 | Disposition: A | Discharge status: Home / Self Care | Payer: Medicaid Other | Source: Ambulatory Visit | Attending: Oncology | Admitting: Oncology

## 2018-11-15 DIAGNOSIS — Z87891 Personal history of nicotine dependence: Secondary | ICD-10-CM

## 2018-11-15 DIAGNOSIS — Z122 Encounter for screening for malignant neoplasm of respiratory organs: Secondary | ICD-10-CM | POA: Diagnosis present

## 2018-11-16 ENCOUNTER — Telehealth: Payer: Self-pay | Admitting: *Deleted

## 2018-11-16 ENCOUNTER — Encounter: Payer: Self-pay | Admitting: *Deleted

## 2018-11-16 ENCOUNTER — Telehealth: Payer: Self-pay | Admitting: Family Medicine

## 2018-11-16 NOTE — Telephone Encounter (Signed)
appt made for 3.2.2020 (your first afternoon availability)

## 2018-11-16 NOTE — Telephone Encounter (Signed)
Please ask patient to schedule an appt to go over her chest CT results in detail; first available Thank you

## 2018-11-16 NOTE — Telephone Encounter (Signed)
Notified patient of LDCT lung cancer screening program results with recommendation for 12 month follow up imaging.  Also notified of incidental findings noted below and is encouraged to discuss further questions with PCP who will receive a copy of this not and/or the CT reports.  Patient verbalized understanding.     IMPRESSION: 1. Lung-RADS Category 2S, benign appearance or behavior. Continue annual screening with low-dose chest CT without contrast in 12 months. 2. The "S" modifier represents a potentially clinically significant non pulmonary finding. Moderate cirrhosis. 3. Coronary artery atherosclerosis. 4. Left nephrolithiasis.  Aortic Atherosclerosis (ICD10-I70.0) and Emphysema (ICD10-J43.9).

## 2018-11-20 ENCOUNTER — Encounter: Payer: Self-pay | Admitting: *Deleted

## 2018-11-23 ENCOUNTER — Encounter: Payer: Self-pay | Admitting: *Deleted

## 2018-11-26 ENCOUNTER — Encounter: Payer: Self-pay | Admitting: Family Medicine

## 2018-11-26 ENCOUNTER — Ambulatory Visit: Payer: Medicaid Other | Admitting: Family Medicine

## 2018-11-26 VITALS — BP 120/68 | HR 82 | Temp 98.0°F | Ht 66.0 in | Wt 136.2 lb

## 2018-11-26 DIAGNOSIS — K7030 Alcoholic cirrhosis of liver without ascites: Secondary | ICD-10-CM

## 2018-11-26 DIAGNOSIS — R87612 Low grade squamous intraepithelial lesion on cytologic smear of cervix (LGSIL): Secondary | ICD-10-CM

## 2018-11-26 DIAGNOSIS — R569 Unspecified convulsions: Secondary | ICD-10-CM

## 2018-11-26 DIAGNOSIS — R918 Other nonspecific abnormal finding of lung field: Secondary | ICD-10-CM | POA: Insufficient documentation

## 2018-11-26 DIAGNOSIS — Z8619 Personal history of other infectious and parasitic diseases: Secondary | ICD-10-CM

## 2018-11-26 DIAGNOSIS — K7460 Unspecified cirrhosis of liver: Secondary | ICD-10-CM | POA: Insufficient documentation

## 2018-11-26 DIAGNOSIS — R87810 Cervical high risk human papillomavirus (HPV) DNA test positive: Secondary | ICD-10-CM

## 2018-11-26 DIAGNOSIS — I2510 Atherosclerotic heart disease of native coronary artery without angina pectoris: Secondary | ICD-10-CM

## 2018-11-26 DIAGNOSIS — J449 Chronic obstructive pulmonary disease, unspecified: Secondary | ICD-10-CM

## 2018-11-26 NOTE — Assessment & Plan Note (Signed)
No seizures in 5+ years; husband will get her back in to see Dr. Melrose Nakayama, neurologist
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
## 2018-11-26 NOTE — Assessment & Plan Note (Signed)
Urged smoking cessation

## 2018-11-26 NOTE — Assessment & Plan Note (Signed)
Refer to lung nodule clinic for ongoing monitoring; urged cessation; husband is limiting cigarettes throughout the day

## 2018-11-26 NOTE — Assessment & Plan Note (Signed)
Refer to GI for evaluation and management of cirrhosis noted on chest CT

## 2018-11-26 NOTE — Assessment & Plan Note (Signed)
Seeing cardiologist; continue aspirin; urged smoking cessation

## 2018-11-26 NOTE — Assessment & Plan Note (Signed)
Seen by Dr. [place] Norris in the past

## 2018-11-26 NOTE — Assessment & Plan Note (Signed)
Refer back to OB-GYN because she was lost to follow-up; referral entered

## 2018-11-27 ENCOUNTER — Encounter: Payer: Self-pay | Admitting: Family Medicine

## 2018-12-03 ENCOUNTER — Encounter: Payer: Self-pay | Admitting: *Deleted

## 2018-12-03 ENCOUNTER — Ambulatory Visit (INDEPENDENT_AMBULATORY_CARE_PROVIDER_SITE_OTHER): Payer: Medicaid Other | Admitting: Psychiatry

## 2018-12-03 ENCOUNTER — Encounter: Payer: Self-pay | Admitting: Psychiatry

## 2018-12-03 DIAGNOSIS — F3160 Bipolar disorder, current episode mixed, unspecified: Secondary | ICD-10-CM

## 2018-12-03 DIAGNOSIS — F1210 Cannabis abuse, uncomplicated: Secondary | ICD-10-CM

## 2018-12-03 MED ORDER — QUETIAPINE FUMARATE 50 MG PO TABS: 50.0000 mg | ORAL_TABLET | Freq: Two times a day (BID) | ORAL | 2 refills | Status: DC

## 2018-12-03 MED ORDER — LITHIUM CARBONATE 300 MG PO CAPS: 300.0000 mg | ORAL_CAPSULE | Freq: Two times a day (BID) | ORAL | 2 refills | Status: DC

## 2018-12-03 MED ORDER — OXCARBAZEPINE 300 MG PO TABS: 300.0000 mg | ORAL_TABLET | Freq: Every day | ORAL | 1 refills | Status: DC

## 2018-12-03 MED ORDER — AMANTADINE HCL 100 MG PO CAPS: 100.0000 mg | ORAL_CAPSULE | Freq: Every day | ORAL | 2 refills | Status: DC
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
## 2018-12-18 ENCOUNTER — Encounter: Payer: Self-pay | Admitting: Obstetrics and Gynecology

## 2018-12-27 ENCOUNTER — Ambulatory Visit: Payer: Medicaid Other | Admitting: Family Medicine

## 2019-01-08 ENCOUNTER — Ambulatory Visit (INDEPENDENT_AMBULATORY_CARE_PROVIDER_SITE_OTHER): Payer: Medicaid Other | Admitting: Family Medicine

## 2019-01-08 ENCOUNTER — Encounter: Payer: Self-pay | Admitting: Family Medicine

## 2019-01-08 ENCOUNTER — Other Ambulatory Visit: Payer: Self-pay

## 2019-01-08 ENCOUNTER — Telehealth: Payer: Self-pay

## 2019-01-08 DIAGNOSIS — Z5329 Procedure and treatment not carried out because of patient's decision for other reasons: Secondary | ICD-10-CM

## 2019-01-08 NOTE — Telephone Encounter (Signed)
Copied from [place] [id number]. Topic: Referral - Status >> Jan 08, 2019 76:70 AM Simone Curia D wrote:  10/06/347 Left message on voicemail to return my call.MA
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
## 2019-01-10 ENCOUNTER — Other Ambulatory Visit: Payer: Self-pay

## 2019-01-10 ENCOUNTER — Telehealth: Payer: Self-pay

## 2019-01-10 ENCOUNTER — Ambulatory Visit (INDEPENDENT_AMBULATORY_CARE_PROVIDER_SITE_OTHER): Payer: Medicaid Other | Admitting: Family Medicine

## 2019-01-10 ENCOUNTER — Encounter: Payer: Self-pay | Admitting: Family Medicine

## 2019-01-10 DIAGNOSIS — Z91199 Patient's noncompliance with other medical treatment and regimen due to unspecified reason: Secondary | ICD-10-CM

## 2019-01-10 DIAGNOSIS — Z5329 Procedure and treatment not carried out because of patient's decision for other reasons: Secondary | ICD-10-CM
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
## 2019-01-10 NOTE — Telephone Encounter (Signed)
Copied from [place] [id number]. Topic: Referral - Status >> Jan 10, 2019 03:21 AM Simone Curia D wrote: 11/19/8248 spoke with client about community resources for food insecurity, exercise and caregiver support. Will follow-up to make sure email was received.MA

## 2019-01-14 ENCOUNTER — Other Ambulatory Visit: Payer: Self-pay

## 2019-01-14 ENCOUNTER — Telehealth: Payer: Self-pay

## 2019-01-14 ENCOUNTER — Ambulatory Visit: Payer: Medicaid Other | Admitting: Psychiatry

## 2019-01-14 NOTE — Telephone Encounter (Signed)
Copied from [place] [id number]. Topic: Referral - Status >> Jan 14, 2019 34:03 AM Simone Curia D wrote: 02/17/8184 left message on voicemail to follow-up about emailed resources on 01/11/2019.MA

## 2019-01-16 ENCOUNTER — Other Ambulatory Visit: Payer: Self-pay | Admitting: Psychiatry

## 2019-01-16 ENCOUNTER — Telehealth: Payer: Self-pay

## 2019-01-16 NOTE — Telephone Encounter (Signed)
Copied from [place] [id number]. Topic: Referral - Status >> Jan 16, 2019 07:46 AM Simone Curia D wrote: 0/10/9845 left message on voicemail to follow-up about emailed resources on 01/11/2019. Will call again on 01/18/2019.MA

## 2019-01-17 ENCOUNTER — Encounter: Payer: Self-pay | Admitting: Family Medicine

## 2019-01-17 ENCOUNTER — Other Ambulatory Visit: Payer: Self-pay

## 2019-01-17 ENCOUNTER — Ambulatory Visit (INDEPENDENT_AMBULATORY_CARE_PROVIDER_SITE_OTHER): Payer: Medicaid Other | Admitting: Family Medicine

## 2019-01-17 DIAGNOSIS — J449 Chronic obstructive pulmonary disease, unspecified: Secondary | ICD-10-CM | POA: Diagnosis not present

## 2019-01-17 DIAGNOSIS — I10 Essential (primary) hypertension: Secondary | ICD-10-CM | POA: Diagnosis not present

## 2019-01-17 DIAGNOSIS — G40209 Localization-related (focal) (partial) symptomatic epilepsy and epileptic syndromes with complex partial seizures, not intractable, without status epilepticus: Secondary | ICD-10-CM

## 2019-01-17 DIAGNOSIS — F3170 Bipolar disorder, currently in remission, most recent episode unspecified: Secondary | ICD-10-CM

## 2019-01-17 DIAGNOSIS — E7849 Other hyperlipidemia: Secondary | ICD-10-CM

## 2019-01-17 DIAGNOSIS — I25119 Atherosclerotic heart disease of native coronary artery with unspecified angina pectoris: Secondary | ICD-10-CM

## 2019-01-17 MED ORDER — BLOOD PRESSURE CUFF MISC: 0 refills | Status: DC

## 2019-01-17 NOTE — Assessment & Plan Note (Addendum)
Notified patient to call her cardiologist as soon as we hang up so her heart doctor will know because he needs to know so he can order tests or adjust her medicine; call 911 if returns; could be sign of a heart attack; she agrees to call him as soon as we hang up, then says she doesn't want to tell a fib; I urged her to please call so he knows her symptoms

## 2019-01-17 NOTE — Assessment & Plan Note (Signed)
Seeing pulmonologist 

## 2019-01-17 NOTE — Assessment & Plan Note (Signed)
Patient thinks she has outgrown that; seeing neurologist

## 2019-01-17 NOTE — Assessment & Plan Note (Signed)
Check lipids when comfortable coming in, limit saturated fats

## 2019-01-17 NOTE — Assessment & Plan Note (Addendum)
Offered BP cuff; I will order that from the pharmacy; limit salt; call me if <140/90

## 2019-01-17 NOTE — Assessment & Plan Note (Addendum)
Referred back to GYN in March; Dr. D has retired; I gave them the phone number (husband and wife present for this part of the discussion); I asked them to please call to schedule an appointment to f/u on this

## 2019-01-17 NOTE — Assessment & Plan Note (Signed)
Seeing psychiatrist
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
## 2019-01-18 ENCOUNTER — Telehealth: Payer: Self-pay

## 2019-01-18 NOTE — Telephone Encounter (Signed)
Copied from [place] [id number]. Topic: Referral - Status >> Jan 18, 2019  4:85 PM Simone Curia D wrote: 06/22/6393 Spoke with patient's husband. He will have her call me back. She was unable to come to the phone. MA

## 2019-01-27 ENCOUNTER — Other Ambulatory Visit: Payer: Self-pay | Admitting: Psychiatry

## 2019-02-12 ENCOUNTER — Encounter: Payer: Self-pay | Admitting: Obstetrics and Gynecology

## 2019-02-19 ENCOUNTER — Other Ambulatory Visit: Payer: Self-pay | Admitting: Nurse Practitioner

## 2019-02-19 DIAGNOSIS — I10 Essential (primary) hypertension: Secondary | ICD-10-CM

## 2019-03-04 ENCOUNTER — Telehealth: Payer: Self-pay

## 2019-03-04 ENCOUNTER — Telehealth: Payer: Self-pay | Admitting: Psychiatry

## 2019-03-04 DIAGNOSIS — F3160 Bipolar disorder, current episode mixed, unspecified: Secondary | ICD-10-CM

## 2019-03-04 MED ORDER — QUETIAPINE FUMARATE 50 MG PO TABS: 50.0000 mg | ORAL_TABLET | Freq: Two times a day (BID) | ORAL | 0 refills | Status: DC

## 2019-03-04 MED ORDER — GABAPENTIN 300 MG PO CAPS: 300.0000 mg | ORAL_CAPSULE | Freq: Every day | ORAL | 0 refills | Status: DC

## 2019-03-04 MED ORDER — OXCARBAZEPINE 300 MG PO TABS: 300.0000 mg | ORAL_TABLET | Freq: Every day | ORAL | 0 refills | Status: DC

## 2019-03-04 MED ORDER — LITHIUM CARBONATE 300 MG PO CAPS: 300.0000 mg | ORAL_CAPSULE | Freq: Two times a day (BID) | ORAL | 0 refills | Status: DC

## 2019-03-04 MED ORDER — AMANTADINE HCL 100 MG PO CAPS: 100.0000 mg | ORAL_CAPSULE | Freq: Every day | ORAL | 0 refills | Status: DC

## 2019-03-04 NOTE — Telephone Encounter (Signed)
Sent all her medications for 30 days

## 2019-03-04 NOTE — Telephone Encounter (Signed)
pt is out of medication needs enough medication to get to next appt .

## 2019-04-04 ENCOUNTER — Telehealth: Payer: Self-pay

## 2019-04-04 DIAGNOSIS — F3160 Bipolar disorder, current episode mixed, unspecified: Secondary | ICD-10-CM

## 2019-04-04 MED ORDER — QUETIAPINE FUMARATE 50 MG PO TABS: 50.0000 mg | ORAL_TABLET | Freq: Two times a day (BID) | ORAL | 1 refills | Status: DC

## 2019-04-04 MED ORDER — LITHIUM CARBONATE 300 MG PO CAPS: 300.0000 mg | ORAL_CAPSULE | Freq: Two times a day (BID) | ORAL | 1 refills | Status: DC

## 2019-04-04 MED ORDER — GABAPENTIN 300 MG PO CAPS: 300.0000 mg | ORAL_CAPSULE | Freq: Every day | ORAL | 1 refills | Status: DC

## 2019-04-04 MED ORDER — OXCARBAZEPINE 300 MG PO TABS: 300.0000 mg | ORAL_TABLET | Freq: Every day | ORAL | 1 refills | Status: DC

## 2019-04-04 NOTE — Telephone Encounter (Signed)
Sent medications to pharmacy  

## 2019-04-04 NOTE — Telephone Encounter (Signed)
pt husband called states pt doesnt have enough medications to get to next appt.

## 2019-04-08 ENCOUNTER — Encounter: Payer: Self-pay | Admitting: Psychiatry

## 2019-04-08 ENCOUNTER — Ambulatory Visit (INDEPENDENT_AMBULATORY_CARE_PROVIDER_SITE_OTHER): Payer: Medicaid Other | Admitting: Psychiatry

## 2019-04-08 ENCOUNTER — Other Ambulatory Visit: Payer: Self-pay

## 2019-04-08 DIAGNOSIS — F3160 Bipolar disorder, current episode mixed, unspecified: Secondary | ICD-10-CM

## 2019-04-08 MED ORDER — AMANTADINE HCL 100 MG PO CAPS: 100.0000 mg | ORAL_CAPSULE | Freq: Every day | ORAL | 0 refills | Status: DC

## 2019-04-08 NOTE — Progress Notes (Signed)
Patient ID: Cynthia [name], female   DOB: Jul 20, 1956, 63 y.o.   MRN: [id number]   Patient is 63 year old female with history of bipolar disorder who was not evaluated for this appointment as her husband picked up the phone. He reported that she is at home and he provided her phone number [phone]. He reported that she has a rash and he has given her Benadryl and she must be sleeping. I tried connecting with her couple of times but patient did not pick up the phone. He reported that she is running out of Seroquel.  All her medications were recently refilled.  Her appointment will be  rescheduled

## 2019-04-17 ENCOUNTER — Other Ambulatory Visit: Payer: Self-pay | Admitting: Nurse Practitioner

## 2019-04-17 ENCOUNTER — Other Ambulatory Visit: Payer: Self-pay

## 2019-04-17 ENCOUNTER — Ambulatory Visit: Payer: Medicaid Other | Admitting: Nurse Practitioner

## 2019-04-17 ENCOUNTER — Encounter: Payer: Self-pay | Admitting: Nurse Practitioner

## 2019-04-17 VITALS — BP 134/66 | HR 89 | Temp 97.3°F | Resp 14 | Ht 66.0 in | Wt 129.9 lb

## 2019-04-17 DIAGNOSIS — R569 Unspecified convulsions: Secondary | ICD-10-CM

## 2019-04-17 DIAGNOSIS — J449 Chronic obstructive pulmonary disease, unspecified: Secondary | ICD-10-CM

## 2019-04-17 DIAGNOSIS — F3170 Bipolar disorder, currently in remission, most recent episode unspecified: Secondary | ICD-10-CM

## 2019-04-17 DIAGNOSIS — K7030 Alcoholic cirrhosis of liver without ascites: Secondary | ICD-10-CM

## 2019-04-17 DIAGNOSIS — Z5181 Encounter for therapeutic drug level monitoring: Secondary | ICD-10-CM

## 2019-04-17 DIAGNOSIS — E7849 Other hyperlipidemia: Secondary | ICD-10-CM

## 2019-04-17 DIAGNOSIS — L239 Allergic contact dermatitis, unspecified cause: Secondary | ICD-10-CM

## 2019-04-17 DIAGNOSIS — I10 Essential (primary) hypertension: Secondary | ICD-10-CM | POA: Diagnosis not present

## 2019-04-17 DIAGNOSIS — I200 Unstable angina: Secondary | ICD-10-CM

## 2019-04-17 DIAGNOSIS — S069X5S Unspecified intracranial injury with loss of consciousness greater than 24 hours with return to pre-existing conscious level, sequela: Secondary | ICD-10-CM

## 2019-04-17 DIAGNOSIS — D692 Other nonthrombocytopenic purpura: Secondary | ICD-10-CM | POA: Insufficient documentation

## 2019-04-17 MED ORDER — LOSARTAN POTASSIUM 50 MG PO TABS: 50.0000 mg | ORAL_TABLET | Freq: Every day | ORAL | 1 refills | Status: DC

## 2019-04-17 MED ORDER — TRIAMCINOLONE ACETONIDE 0.1 % EX CREA: 1.0000 "application " | TOPICAL_CREAM | Freq: Two times a day (BID) | CUTANEOUS | 0 refills | Status: DC

## 2019-04-17 MED ORDER — ATORVASTATIN CALCIUM 20 MG PO TABS: 20.0000 mg | ORAL_TABLET | Freq: Every day | ORAL | 1 refills | Status: DC

## 2019-04-17 MED ORDER — AMLODIPINE BESYLATE 10 MG PO TABS: 10.0000 mg | ORAL_TABLET | Freq: Every day | ORAL | 1 refills | Status: DC

## 2019-04-17 NOTE — Patient Instructions (Signed)
-   Please call [facility] to schedule an appointment to discuss your liver- [phone]
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
## 2019-04-18 LAB — LIPID PANEL
Cholesterol: 158 mg/dL (ref <200)
HDL: 44 mg/dL — ABNORMAL LOW (ref >=50)
LDL Cholesterol (Calc): 79 mg/dL (calc)
Non-HDL Cholesterol (Calc): 114 mg/dL (calc) (ref <130)
Total CHOL/HDL Ratio: 3.6 (calc) (ref <5.0)
Triglycerides: 266 mg/dL — ABNORMAL HIGH (ref <150)

## 2019-04-18 LAB — COMPLETE METABOLIC PANEL WITH GFR
AG Ratio: 1.3 (calc) (ref 1.0–2.5)
ALT: 9 U/L (ref 6–29)
AST: 13 U/L (ref 10–35)
Albumin: 4.3 g/dL (ref 3.6–5.1)
Alkaline phosphatase (APISO): 66 U/L (ref 37–153)
BUN: 13 mg/dL (ref 7–25)
CO2: 26 mmol/L (ref 20–32)
Calcium: 9.8 mg/dL (ref 8.6–10.4)
Chloride: 106 mmol/L (ref 98–110)
Creat: 0.95 mg/dL (ref 0.50–0.99)
GFR, Est African American: 73 mL/min/{1.73_m2} (ref >=60)
GFR, Est Non African American: 63 mL/min/{1.73_m2} (ref >=60)
Globulin: 3.4 g/dL (calc) (ref 1.9–3.7)
Glucose, Bld: 107 mg/dL — ABNORMAL HIGH (ref 65–99)
Potassium: 4.6 mmol/L (ref 3.5–5.3)
Sodium: 137 mmol/L (ref 135–146)
Total Bilirubin: 0.3 mg/dL (ref 0.2–1.2)
Total Protein: 7.7 g/dL (ref 6.1–8.1)

## 2019-04-29 ENCOUNTER — Other Ambulatory Visit: Payer: Self-pay | Admitting: Nurse Practitioner

## 2019-04-29 DIAGNOSIS — L239 Allergic contact dermatitis, unspecified cause: Secondary | ICD-10-CM

## 2019-05-13 ENCOUNTER — Ambulatory Visit (INDEPENDENT_AMBULATORY_CARE_PROVIDER_SITE_OTHER): Payer: Medicaid Other | Admitting: Psychiatry

## 2019-05-13 ENCOUNTER — Encounter: Payer: Self-pay | Admitting: Psychiatry

## 2019-05-13 ENCOUNTER — Other Ambulatory Visit: Payer: Self-pay

## 2019-05-13 ENCOUNTER — Ambulatory Visit: Payer: Medicaid Other | Admitting: Psychiatry

## 2019-05-13 DIAGNOSIS — F3160 Bipolar disorder, current episode mixed, unspecified: Secondary | ICD-10-CM

## 2019-05-13 MED ORDER — GABAPENTIN 300 MG PO CAPS: 300.0000 mg | ORAL_CAPSULE | Freq: Every day | ORAL | 3 refills | Status: DC

## 2019-05-13 MED ORDER — AMANTADINE HCL 100 MG PO CAPS: 100.0000 mg | ORAL_CAPSULE | Freq: Every day | ORAL | 0 refills | Status: DC

## 2019-05-13 MED ORDER — LITHIUM CARBONATE 300 MG PO CAPS: 300.0000 mg | ORAL_CAPSULE | Freq: Two times a day (BID) | ORAL | 3 refills | Status: DC

## 2019-05-13 MED ORDER — AMANTADINE HCL 100 MG PO CAPS: 100.0000 mg | ORAL_CAPSULE | Freq: Every day | ORAL | 3 refills | Status: DC

## 2019-05-13 MED ORDER — QUETIAPINE FUMARATE 50 MG PO TABS: 50.0000 mg | ORAL_TABLET | Freq: Three times a day (TID) | ORAL | 3 refills | Status: DC

## 2019-05-13 NOTE — Progress Notes (Signed)
Patient ID: Cynthia [name], female   DOB: 1956/03/06, 63 y.o.   MRN: [id number]  Patient is a 63 year old female with history of bipolar disorder who was followed a medication management. Her husband was also present. She reported that she ran out of her medications recently as she has been taking seroquel More than the amount prescribed and takes 100 mg at bedtime. She was having difficulty with sleep. She reported that she is compliant with her medications. She stated that she has been staying at home and has been spending time there. Her husband also mentioned about the rash on her arms and legs and reported that he took her to the primary care physician and they advised to take Benadryl and steroid but it is not helping. He is planning to take her back to the PCP and will follow-up about the rash. He reported that wherever she scratches it will clear up. She has been getting restless due to the rash. Patient reported that she is scared to go to the dermatologist as they took the biopsy last time. However patient was encouraged to follow up with the dermatologist regarding the rash. Her husband thinks that it might be due to the new home as they have moved in last December and she might be allergic to something in the house.  Advise patient to follow up with the rash and follow up with the PCP and the dermatologist and she agreed with the plan.  Plan I will increase seroquel 50 mg in the morning and 100 at bedtime Continue other medications as prescribed She will follow-up in three months earlier depending on her symptoms   I connected with patient via telemedicine application and verified that I am speaking with the correct person using two identifiers.  I discussed the limitations of evaluation and management by telemedicine and the availability of in person appointments. The patient expressed understanding and agreed to proceed.   I discussed the assessment and treatment plan with the patient. The  patient was provided an opportunity to ask questions and all were answered. The patient agreed with the plan and demonstrated an understanding of the instructions.   The patient was advised to call back or seek an in-person evaluation if the symptoms worsen or if the condition fails to improve as anticipated.   I provided 15 minutes of non-face-to-face time during this encounter.

## 2019-05-14 ENCOUNTER — Ambulatory Visit: Payer: Medicaid Other | Admitting: Family Medicine

## 2019-05-14 ENCOUNTER — Other Ambulatory Visit: Payer: Self-pay

## 2019-05-14 ENCOUNTER — Encounter: Payer: Self-pay | Admitting: Family Medicine

## 2019-05-14 VITALS — BP 120/80 | HR 85 | Temp 97.3°F | Resp 14 | Ht 66.0 in | Wt 127.6 lb

## 2019-05-14 DIAGNOSIS — Z5181 Encounter for therapeutic drug level monitoring: Secondary | ICD-10-CM

## 2019-05-14 DIAGNOSIS — R21 Rash and other nonspecific skin eruption: Secondary | ICD-10-CM

## 2019-05-14 MED ORDER — DEXAMETHASONE SODIUM PHOSPHATE 10 MG/ML IJ SOLN
10.0000 mg | Freq: Once | INTRAMUSCULAR | Status: AC
  Administered 2019-05-14: 10 mg via INTRAMUSCULAR

## 2019-05-14 MED ORDER — HYDROXYZINE HCL 25 MG PO TABS: 25.0000 mg | ORAL_TABLET | Freq: Three times a day (TID) | ORAL | 1 refills | Status: DC | PRN

## 2019-05-14 MED ORDER — PREDNISONE 10 MG PO TABS: ORAL_TABLET | ORAL | 0 refills | Status: DC

## 2019-05-14 MED ORDER — FAMOTIDINE 20 MG PO TABS: 20.0000 mg | ORAL_TABLET | Freq: Two times a day (BID) | ORAL | 0 refills | Status: DC | PRN
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
## 2019-05-14 NOTE — Patient Instructions (Addendum)
Take only the hydroxyzine or benadryl - do not take together  Benadryl 25-50 mg every 4-6 hours (max in 24 hours is 300 mg)  Take pepcid 40 mg today and the next several days - will also help with the rash and itching  Decrease the use of topical hydrocortisone cream to 1-2 times a day less than 7 days in a row.  AS soon as the prednisone is helping with the itching, stop using the hydrocortisone.  Use vaseline to hydrate skin where you have dry patches   Contact Dermatitis Dermatitis is redness, soreness, and swelling (inflammation) of the skin. Contact dermatitis is a reaction to something that touches the skin. There are two types of contact dermatitis:  Irritant contact dermatitis. This happens when something bothers (irritates) your skin, like soap.  Allergic contact dermatitis. This is caused when you are exposed to something that you are allergic to, such as poison ivy. What are the causes?  Common causes of irritant contact dermatitis include: ? Makeup. ? Soaps. ? Detergents. ? Bleaches. ? Acids. ? Metals, such as nickel.  Common causes of allergic contact dermatitis include: ? Plants. ? Chemicals. ? Jewelry. ? Latex. ? Medicines. ? Preservatives in products, such as clothing. What increases the risk?  Having a job that exposes you to things that bother your skin.  Having asthma or eczema. What are the signs or symptoms? Symptoms may happen anywhere the irritant has touched your skin. Symptoms include:  Dry or flaky skin.  Redness.  Cracks.  Itching.  Pain or a burning feeling.  Blisters.  Blood or clear fluid draining from skin cracks. With allergic contact dermatitis, swelling may occur. This may happen in places such as the eyelids, mouth, or genitals. How is this treated?  This condition is treated by checking for the cause of the reaction and protecting your skin. Treatment may also include: ? Steroid creams, ointments, or  medicines. ? Antibiotic medicines or other ointments, if you have a skin infection. ? Lotion or medicines to help with itching. ? A bandage (dressing). Follow these instructions at home: Skin care  Moisturize your skin as needed.  Put cool cloths on your skin.  Put a baking soda paste on your skin. Stir water into baking soda until it looks like a paste.  Do not scratch your skin.  Avoid having things rub up against your skin.  Avoid the use of soaps, perfumes, and dyes. Medicines  Take or apply over-the-counter and prescription medicines only as told by your doctor.  If you were prescribed an antibiotic medicine, take or apply it as told by your doctor. Do not stop using it even if your condition starts to get better. Bathing  Take a bath with: ? Epsom salts. ? Baking soda. ? Colloidal oatmeal.  Bathe less often.  Bathe in warm water. Avoid using hot water. Bandage care  If you were given a bandage, change it as told by your health care provider.  Wash your hands with soap and water before and after you change your bandage. If soap and water are not available, use hand sanitizer. General instructions  Avoid the things that caused your reaction. If you do not know what caused it, keep a journal. Write down: ? What you eat. ? What skin products you use. ? What you drink. ? What you wear in the area that has symptoms. This includes jewelry.  Check the affected areas every day for signs of infection. Check for: ? More redness,  swelling, or pain. ? More fluid or blood. ? Warmth. ? Pus or a bad smell.  Keep all follow-up visits as told by your doctor. This is important. Contact a doctor if:  You do not get better with treatment.  Your condition gets worse.  You have signs of infection, such as: ? More swelling. ? Tenderness. ? More redness. ? Soreness. ? Warmth.  You have a fever.  You have new symptoms. Get help right away if:  You have a very bad  headache.  You have neck pain.  Your neck is stiff.  You throw up (vomit).  You feel very sleepy.  You see red streaks coming from the area.  Your bone or joint near the area hurts after the skin has healed.  The area turns darker.  You have trouble breathing. Summary  Dermatitis is redness, soreness, and swelling of the skin.  Symptoms may occur where the irritant has touched you.  Treatment may include medicines and skin care.  If you do not know what caused your reaction, keep a journal.  Contact a doctor if your condition gets worse or you have signs of infection. This information is not intended to replace advice given to you by your health care provider. Make sure you discuss any questions you have with your health care provider. Document Released: 07/10/2009 Document Revised: 01/02/2019 Document Reviewed: 03/28/2018 Elsevier Patient Education  2020 [employer].

## 2019-05-15 LAB — COMPLETE METABOLIC PANEL WITH GFR
AG Ratio: 1.1 (calc) (ref 1.0–2.5)
ALT: 14 U/L (ref 6–29)
AST: 18 U/L (ref 10–35)
Albumin: 4.3 g/dL (ref 3.6–5.1)
Alkaline phosphatase (APISO): 67 U/L (ref 37–153)
BUN: 12 mg/dL (ref 7–25)
CO2: 23 mmol/L (ref 20–32)
Calcium: 10.5 mg/dL — ABNORMAL HIGH (ref 8.6–10.4)
Chloride: 103 mmol/L (ref 98–110)
Creat: 0.85 mg/dL (ref 0.50–0.99)
GFR, Est African American: 84 mL/min/{1.73_m2} (ref >=60)
GFR, Est Non African American: 72 mL/min/{1.73_m2} (ref >=60)
Globulin: 3.8 g/dL (calc) — ABNORMAL HIGH (ref 1.9–3.7)
Glucose, Bld: 119 mg/dL — ABNORMAL HIGH (ref 65–99)
Potassium: 4.5 mmol/L (ref 3.5–5.3)
Sodium: 135 mmol/L (ref 135–146)
Total Bilirubin: 0.5 mg/dL (ref 0.2–1.2)
Total Protein: 8.1 g/dL (ref 6.1–8.1)

## 2019-05-15 LAB — CBC WITH DIFFERENTIAL/PLATELET
Absolute Monocytes: 410 cells/uL (ref 200–950)
Basophils Absolute: 131 cells/uL (ref 0–200)
Basophils Relative: 1.6 %
Eosinophils Absolute: 394 cells/uL (ref 15–500)
Eosinophils Relative: 4.8 %
HCT: 45 % (ref 35.0–45.0)
Hemoglobin: 14.9 g/dL (ref 11.7–15.5)
Lymphs Abs: 2403 cells/uL (ref 850–3900)
MCH: 30.3 pg (ref 27.0–33.0)
MCHC: 33.1 g/dL (ref 32.0–36.0)
MCV: 91.5 fL (ref 80.0–100.0)
MPV: 10.3 fL (ref 7.5–12.5)
Monocytes Relative: 5 %
Neutro Abs: 4863 cells/uL (ref 1500–7800)
Neutrophils Relative %: 59.3 %
Platelets: 193 10*3/uL (ref 140–400)
RBC: 4.92 10*6/uL (ref 3.80–5.10)
RDW: 12 % (ref 11.0–15.0)
Total Lymphocyte: 29.3 %
WBC: 8.2 10*3/uL (ref 3.8–10.8)

## 2019-05-20 ENCOUNTER — Telehealth: Payer: Self-pay

## 2019-05-20 NOTE — Telephone Encounter (Signed)
Miel, I am really not sure of how we should handle this.   Please advise

## 2019-05-28 ENCOUNTER — Ambulatory Visit: Payer: Medicaid Other | Admitting: Gastroenterology

## 2019-05-28 NOTE — Progress Notes (Deleted)
Primary Care Physician: Delsa Grana, PA-C  Primary Gastroenterologist:  Dr. Lucilla Lame  No chief complaint on file.   HPI: Cynthia [name] is a 63 y.o. female here for follow-up after being treated for hepatitis C.  The patient's posttreatment viral load was negative.  The patient's fibrosis scan showed findings consistent with cirrhosis.  It does not appear that she has had any imaging since then and that was in 2017.  The patient had her blood tested in 2019 that showed her to be susceptible to hepatitis B but does not appear to have been tested for hepatitis A antibodies.  Current Outpatient Medications  Medication Sig Dispense Refill  . amantadine (SYMMETREL) 100 MG capsule Take 1 capsule (100 mg total) by mouth daily. 30 capsule 3  . amantadine (SYMMETREL) 100 MG capsule Take 1 capsule (100 mg total) by mouth daily. 30 capsule 0  . amLODipine (NORVASC) 10 MG tablet Take 1 tablet (10 mg total) by mouth daily. 90 tablet 1  . aspirin EC 81 MG tablet Take 1 tablet (81 mg total) by mouth daily.    Marland Kitchen atorvastatin (LIPITOR) 20 MG tablet TAKE 1 TABLET BY MOUTH BEDTIME 90 tablet 1  . famotidine (PEPCID) 20 MG tablet Take 1 tablet (20 mg total) by mouth 2 (two) times daily as needed for heartburn (allergic reachtion/hives). 60 tablet 0  . gabapentin (NEURONTIN) 300 MG capsule Take 1 capsule (300 mg total) by mouth daily. 30 capsule 3  . Glecaprevir-Pibrentasvir (MAVYRET) 100-40 MG TABS Take 1 tablet by mouth daily.    . Glycopyrrolate-Formoterol (BEVESPI AEROSPHERE) 9-4.8 MCG/ACT AERO Inhale 2 puffs into the lungs 2 (two) times daily. 1 Inhaler 11  . hydrOXYzine (ATARAX/VISTARIL) 25 MG tablet Take 1-2 tablets (25-50 mg total) by mouth 3 (three) times daily as needed for itching. 60 tablet 1  . lithium carbonate 300 MG capsule Take 1 capsule (300 mg total) by mouth 2 (two) times daily with a meal. 60 capsule 3  . losartan (COZAAR) 50 MG tablet Take 1 tablet (50 mg total) by mouth daily. 90  tablet 1  . nitroGLYCERIN (NITROSTAT) 0.4 MG SL tablet Place 1 tablet (0.4 mg total) under the tongue every 5 (five) minutes as needed for chest pain. Maximum of 3 pills; call 911 at first sign of chest pain (Patient not taking: Reported on 01/10/2019) 25 tablet 1  . Oxcarbazepine (TRILEPTAL) 300 MG tablet Take 1 tablet (300 mg total) by mouth at bedtime. 30 tablet 1  . predniSONE (DELTASONE) 10 MG tablet 6 tabs poqd 1-2, 5 poqd 3-4.Marland Kitchen1 tab poqd 11-12 42 tablet 0  . PROAIR HFA 108 (90 Base) MCG/ACT inhaler INHALE 2 PUFFS BY MOUTH EVERY 6 HOURS IFNEEDED FOR WHEEZING OR SHORTNESS OF BREATH. (Patient not taking: Reported on 04/17/2019) 8.5 g 5  . QUEtiapine (SEROQUEL) 50 MG tablet Take 1 tablet (50 mg total) by mouth 3 (three) times daily. 90 tablet 3  . triamcinolone cream (KENALOG) 0.1 % APPLY 1 APPLICATION TOPICALLY TWICE DAILY 30 g 0  . vitamin C (ASCORBIC ACID) 500 MG tablet Take 1 tablet (500 mg total) by mouth daily. 30 tablet 3   No current facility-administered medications for this visit.     Allergies as of 05/28/2019 - Review Complete 05/14/2019  Allergen Reaction Noted  . Morphine Anaphylaxis 09/17/2015    ROS:  General: Negative for anorexia, weight loss, fever, chills, fatigue, weakness. ENT: Negative for hoarseness, difficulty swallowing , nasal congestion. CV: Negative for chest pain, angina, palpitations,  dyspnea on exertion, peripheral edema.  Respiratory: Negative for dyspnea at rest, dyspnea on exertion, cough, sputum, wheezing.  GI: See history of present illness. GU:  Negative for dysuria, hematuria, urinary incontinence, urinary frequency, nocturnal urination.  Endo: Negative for unusual weight change.    Physical Examination:   There were no vitals taken for this visit.  General: Well-nourished, well-developed in no acute distress.  Eyes: No icterus. Conjunctivae pink. Mouth: Oropharyngeal mucosa moist and pink , no lesions erythema or exudate. Lungs: Clear to  auscultation bilaterally. Non-labored. Heart: Regular rate and rhythm, no murmurs rubs or gallops.  Abdomen: Bowel sounds are normal, nontender, nondistended, no hepatosplenomegaly or masses, no abdominal bruits or hernia , no rebound or guarding.   Extremities: No lower extremity edema. No clubbing or deformities. Neuro: Alert and oriented x 3.  Grossly intact. Skin: Warm and dry, no jaundice.   Psych: Alert and cooperative, normal mood and affect.  Labs:  ***  Imaging Studies: No results found.  Assessment and Plan:   Tylin Sagert is a 63 y.o. y/o female ***    Lucilla Lame, MD. [facility] Regal   Note: This dictation was prepared with Dragon dictation along with smaller phrase technology. Any transcriptional errors that result from this process are unintentional.

## 2019-06-25 ENCOUNTER — Other Ambulatory Visit: Payer: Self-pay

## 2019-06-25 ENCOUNTER — Encounter: Payer: Self-pay | Admitting: Gastroenterology

## 2019-06-25 ENCOUNTER — Ambulatory Visit (INDEPENDENT_AMBULATORY_CARE_PROVIDER_SITE_OTHER): Payer: Medicaid Other | Admitting: Gastroenterology

## 2019-06-25 VITALS — BP 161/81 | HR 87 | Temp 98.3°F | Ht 66.0 in | Wt 130.8 lb

## 2019-06-25 DIAGNOSIS — B182 Chronic viral hepatitis C: Secondary | ICD-10-CM | POA: Diagnosis not present

## 2019-06-25 NOTE — Progress Notes (Signed)
Primary Care Physician: Delsa Grana, PA-C  Primary Gastroenterologist:  Dr. Lucilla Lame  Chief Complaint  Patient presents with  . Follow up Hepatitis C    Completed treatment    HPI: Cynthia [name] is a 63 y.o. female here for follow-up after being treated for hepatitis C.  The patient's last blood work showed her viral load to be undetectable.  She is now here for 1 year follow-up post treatment.  The patient had a fibrosis scan that showed her to have F3/F4 prior to starting treatment.  She reports that she never had any symptoms of the hepatitis C and she is feeling fine now.  Current Outpatient Medications  Medication Sig Dispense Refill  . amantadine (SYMMETREL) 100 MG capsule Take 1 capsule (100 mg total) by mouth daily. 30 capsule 3  . amantadine (SYMMETREL) 100 MG capsule Take 1 capsule (100 mg total) by mouth daily. 30 capsule 0  . amLODipine (NORVASC) 10 MG tablet Take 1 tablet (10 mg total) by mouth daily. 90 tablet 1  . aspirin EC 81 MG tablet Take 1 tablet (81 mg total) by mouth daily.    Marland Kitchen atorvastatin (LIPITOR) 20 MG tablet TAKE 1 TABLET BY MOUTH BEDTIME 90 tablet 1  . famotidine (PEPCID) 20 MG tablet Take 1 tablet (20 mg total) by mouth 2 (two) times daily as needed for heartburn (allergic reachtion/hives). 60 tablet 0  . gabapentin (NEURONTIN) 300 MG capsule Take 1 capsule (300 mg total) by mouth daily. 30 capsule 3  . Glecaprevir-Pibrentasvir (MAVYRET) 100-40 MG TABS Take 1 tablet by mouth daily.    . Glycopyrrolate-Formoterol (BEVESPI AEROSPHERE) 9-4.8 MCG/ACT AERO Inhale 2 puffs into the lungs 2 (two) times daily. 1 Inhaler 11  . hydrOXYzine (ATARAX/VISTARIL) 25 MG tablet Take 1-2 tablets (25-50 mg total) by mouth 3 (three) times daily as needed for itching. 60 tablet 1  . lithium carbonate 300 MG capsule Take 1 capsule (300 mg total) by mouth 2 (two) times daily with a meal. 60 capsule 3  . losartan (COZAAR) 50 MG tablet Take 1 tablet (50 mg total) by mouth  daily. 90 tablet 1  . Oxcarbazepine (TRILEPTAL) 300 MG tablet Take 1 tablet (300 mg total) by mouth at bedtime. 30 tablet 1  . predniSONE (DELTASONE) 10 MG tablet 6 tabs poqd 1-2, 5 poqd 3-4.Marland Kitchen1 tab poqd 11-12 42 tablet 0  . QUEtiapine (SEROQUEL) 50 MG tablet Take 1 tablet (50 mg total) by mouth 3 (three) times daily. 90 tablet 3  . triamcinolone cream (KENALOG) 0.1 % APPLY 1 APPLICATION TOPICALLY TWICE DAILY 30 g 0  . vitamin C (ASCORBIC ACID) 500 MG tablet Take 1 tablet (500 mg total) by mouth daily. 30 tablet 3  . nitroGLYCERIN (NITROSTAT) 0.4 MG SL tablet Place 1 tablet (0.4 mg total) under the tongue every 5 (five) minutes as needed for chest pain. Maximum of 3 pills; call 911 at first sign of chest pain (Patient not taking: Reported on 01/10/2019) 25 tablet 1  . PROAIR HFA 108 (90 Base) MCG/ACT inhaler INHALE 2 PUFFS BY MOUTH EVERY 6 HOURS IFNEEDED FOR WHEEZING OR SHORTNESS OF BREATH. (Patient not taking: Reported on 04/17/2019) 8.5 g 5   No current facility-administered medications for this visit.     Allergies as of 06/25/2019 - Review Complete 06/25/2019  Allergen Reaction Noted  . Morphine Anaphylaxis 09/17/2015    ROS:  General: Negative for anorexia, weight loss, fever, chills, fatigue, weakness. ENT: Negative for hoarseness, difficulty swallowing , nasal congestion.  CV: Negative for chest pain, angina, palpitations, dyspnea on exertion, peripheral edema.  Respiratory: Negative for dyspnea at rest, dyspnea on exertion, cough, sputum, wheezing.  GI: See history of present illness. GU:  Negative for dysuria, hematuria, urinary incontinence, urinary frequency, nocturnal urination.  Endo: Negative for unusual weight change.    Physical Examination:   BP (!) 161/81   Pulse 87   Temp 98.3 F (36.8 C) (Oral)   Ht 5\' 6"  (1.676 m)   Wt 130 lb 12.8 oz (59.3 kg)   BMI 21.11 kg/m   General: Well-nourished, well-developed in no acute distress.  Eyes: No icterus. Conjunctivae pink.  Extremities: No lower extremity edema. No clubbing or deformities. Neuro: Alert and oriented x 3.  Grossly intact. Skin: Warm and dry, no jaundice.   Psych: Alert and cooperative, normal mood and affect.  Labs:    Imaging Studies: No results found.  Assessment and Plan:   Cynthia [name] is a 63 y.o. y/o female who comes in today for follow-up after being treated for hepatitis C.  The patient denies any symptoms or problems at the present time.  She finished treatment over a year ago.  The patient will have her labs checked for alpha-fetoprotein, viral load and LFTs.  She will also be set up for a ultrasound of the right upper quadrant.  The patient has been explained the plan and agrees with it.    Lucilla Lame, MD. [facility] Regal   Note: This dictation was prepared with Dragon dictation along with smaller phrase technology. Any transcriptional errors that result from this process are unintentional.

## 2019-06-27 LAB — AFP TUMOR MARKER: AFP, Serum, Tumor Marker: 2.3 ng/mL (ref 0.0–8.3)

## 2019-06-27 LAB — HEPATIC FUNCTION PANEL
ALT: 12 IU/L (ref 0–32)
AST: 22 IU/L (ref 0–40)
Albumin: 4.5 g/dL (ref 3.8–4.8)
Alkaline Phosphatase: 23 IU/L — ABNORMAL LOW (ref 39–117)
Bilirubin Total: 0.5 mg/dL (ref 0.0–1.2)
Bilirubin, Direct: 0.1 mg/dL (ref 0.00–0.40)
Total Protein: 7.7 g/dL (ref 6.0–8.5)

## 2019-06-27 LAB — HCV RNA QUANT: Hepatitis C Quantitation: NOT DETECTED IU/mL

## 2019-06-28 ENCOUNTER — Telehealth: Payer: Self-pay

## 2019-06-28 NOTE — Telephone Encounter (Signed)
-----   Message from Lucilla Lame, MD sent at 06/27/2019 10:04 AM EDT ----- Let the patient know that her liver enzymes were not elevated and her viral load was negative meaning she has been cured of her hepatitis C.  We still do not have the ultrasound back and we will let her know what that shows.  The tumor marker was also negative for any sign of liver cancer.  She had a ultrasound and alpha-fetoprotein every 6 months otherwise no further follow-up for her hepatitis C is required.

## 2019-06-28 NOTE — Telephone Encounter (Signed)
LVM for pt to return my call.

## 2019-07-01 ENCOUNTER — Ambulatory Visit: Payer: Medicaid Other

## 2019-07-01 NOTE — Telephone Encounter (Signed)
Tried contacting pt with results. Not able to leave a message. Voicemail was full.

## 2019-07-15 NOTE — Telephone Encounter (Signed)
Contacted pt and was informed of lab results. Pt will contact me when she is ready to reschedule [place].

## 2019-07-19 ENCOUNTER — Other Ambulatory Visit: Payer: Self-pay

## 2019-07-19 ENCOUNTER — Ambulatory Visit: Payer: Medicaid Other | Admitting: Family Medicine

## 2019-07-19 ENCOUNTER — Encounter: Payer: Self-pay | Admitting: Family Medicine

## 2019-07-19 VITALS — BP 122/84 | HR 86 | Temp 97.8°F | Resp 16 | Ht 64.0 in | Wt 129.2 lb

## 2019-07-19 DIAGNOSIS — I10 Essential (primary) hypertension: Secondary | ICD-10-CM

## 2019-07-19 DIAGNOSIS — E7849 Other hyperlipidemia: Secondary | ICD-10-CM

## 2019-07-19 DIAGNOSIS — R413 Other amnesia: Secondary | ICD-10-CM

## 2019-07-19 DIAGNOSIS — I739 Peripheral vascular disease, unspecified: Secondary | ICD-10-CM

## 2019-07-19 DIAGNOSIS — I25119 Atherosclerotic heart disease of native coronary artery with unspecified angina pectoris: Secondary | ICD-10-CM

## 2019-07-19 DIAGNOSIS — R21 Rash and other nonspecific skin eruption: Secondary | ICD-10-CM

## 2019-07-19 DIAGNOSIS — R269 Unspecified abnormalities of gait and mobility: Secondary | ICD-10-CM

## 2019-07-19 DIAGNOSIS — M79605 Pain in left leg: Secondary | ICD-10-CM

## 2019-07-19 DIAGNOSIS — J449 Chronic obstructive pulmonary disease, unspecified: Secondary | ICD-10-CM | POA: Diagnosis not present

## 2019-07-19 DIAGNOSIS — Z23 Encounter for immunization: Secondary | ICD-10-CM

## 2019-07-19 DIAGNOSIS — K7030 Alcoholic cirrhosis of liver without ascites: Secondary | ICD-10-CM

## 2019-07-19 DIAGNOSIS — M549 Dorsalgia, unspecified: Secondary | ICD-10-CM

## 2019-07-19 DIAGNOSIS — G8929 Other chronic pain: Secondary | ICD-10-CM

## 2019-07-19 DIAGNOSIS — M79604 Pain in right leg: Secondary | ICD-10-CM

## 2019-07-19 DIAGNOSIS — R569 Unspecified convulsions: Secondary | ICD-10-CM

## 2019-07-19 DIAGNOSIS — F3170 Bipolar disorder, currently in remission, most recent episode unspecified: Secondary | ICD-10-CM

## 2019-07-19 MED ORDER — ALBUTEROL SULFATE HFA 108 (90 BASE) MCG/ACT IN AERS: INHALATION_SPRAY | RESPIRATORY_TRACT | 5 refills | Status: DC

## 2019-07-19 MED ORDER — BEVESPI AEROSPHERE 9-4.8 MCG/ACT IN AERO: 2.0000 | INHALATION_SPRAY | Freq: Two times a day (BID) | RESPIRATORY_TRACT | 1 refills | Status: DC
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
## 2019-07-20 LAB — COMPLETE METABOLIC PANEL WITH GFR
AG Ratio: 1.3 (calc) (ref 1.0–2.5)
ALT: 11 U/L (ref 6–29)
AST: 15 U/L (ref 10–35)
Albumin: 4.5 g/dL (ref 3.6–5.1)
Alkaline phosphatase (APISO): 75 U/L (ref 37–153)
BUN: 10 mg/dL (ref 7–25)
CO2: 26 mmol/L (ref 20–32)
Calcium: 10.2 mg/dL (ref 8.6–10.4)
Chloride: 105 mmol/L (ref 98–110)
Creat: 0.99 mg/dL (ref 0.50–0.99)
GFR, Est African American: 70 mL/min/{1.73_m2} (ref >=60)
GFR, Est Non African American: 60 mL/min/{1.73_m2} (ref >=60)
Globulin: 3.6 g/dL (calc) (ref 1.9–3.7)
Glucose, Bld: 120 mg/dL — ABNORMAL HIGH (ref 65–99)
Potassium: 4.9 mmol/L (ref 3.5–5.3)
Sodium: 138 mmol/L (ref 135–146)
Total Bilirubin: 0.5 mg/dL (ref 0.2–1.2)
Total Protein: 8.1 g/dL (ref 6.1–8.1)

## 2019-07-20 LAB — LIPID PANEL
Cholesterol: 150 mg/dL (ref <200)
HDL: 49 mg/dL — ABNORMAL LOW (ref >=50)
LDL Cholesterol (Calc): 72 mg/dL (calc)
Non-HDL Cholesterol (Calc): 101 mg/dL (calc) (ref <130)
Total CHOL/HDL Ratio: 3.1 (calc) (ref <5.0)
Triglycerides: 194 mg/dL — ABNORMAL HIGH (ref <150)

## 2019-07-22 ENCOUNTER — Telehealth: Payer: Self-pay

## 2019-07-22 ENCOUNTER — Ambulatory Visit: Payer: Medicaid Other | Admitting: Psychiatry

## 2019-07-22 ENCOUNTER — Encounter: Payer: Self-pay | Admitting: Psychiatry

## 2019-07-22 ENCOUNTER — Encounter: Payer: Self-pay | Admitting: Family Medicine

## 2019-07-22 ENCOUNTER — Other Ambulatory Visit: Payer: Self-pay

## 2019-07-22 ENCOUNTER — Ambulatory Visit (INDEPENDENT_AMBULATORY_CARE_PROVIDER_SITE_OTHER): Payer: Medicaid Other | Admitting: Psychiatry

## 2019-07-22 DIAGNOSIS — F3160 Bipolar disorder, current episode mixed, unspecified: Secondary | ICD-10-CM

## 2019-07-22 DIAGNOSIS — E162 Hypoglycemia, unspecified: Secondary | ICD-10-CM

## 2019-07-22 MED ORDER — QUETIAPINE FUMARATE 50 MG PO TABS: 50.0000 mg | ORAL_TABLET | Freq: Three times a day (TID) | ORAL | 0 refills | Status: DC

## 2019-07-22 MED ORDER — AMANTADINE HCL 100 MG PO CAPS: 100.0000 mg | ORAL_CAPSULE | Freq: Every day | ORAL | 0 refills | Status: DC

## 2019-07-22 MED ORDER — GABAPENTIN 300 MG PO CAPS: 300.0000 mg | ORAL_CAPSULE | Freq: Every day | ORAL | 0 refills | Status: DC

## 2019-07-22 MED ORDER — LITHIUM CARBONATE 300 MG PO CAPS: 300.0000 mg | ORAL_CAPSULE | Freq: Two times a day (BID) | ORAL | 0 refills | Status: DC

## 2019-07-22 MED ORDER — OXCARBAZEPINE 300 MG PO TABS: 300.0000 mg | ORAL_TABLET | Freq: Every day | ORAL | 0 refills | Status: DC
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
## 2019-07-22 NOTE — Telephone Encounter (Signed)
-----   Message from Delsa Grana, [place] sent at 07/22/2019  9:21 AM EDT ----- Can you please add on A1C for hyperglycemia dx or abnormal fasting glucose  Can notify pt that kidney and liver function look good, bad cholesterol was well controlled Triglycerides and blood sugar are a little elevated, can work on decreasing simple sugars and carbs in diet We will call her back with A1C level  thanks

## 2019-07-31 ENCOUNTER — Other Ambulatory Visit (INDEPENDENT_AMBULATORY_CARE_PROVIDER_SITE_OTHER): Payer: Self-pay | Admitting: Vascular Surgery

## 2019-07-31 ENCOUNTER — Ambulatory Visit: Payer: Medicaid Other | Attending: Family Medicine

## 2019-07-31 DIAGNOSIS — M79604 Pain in right leg: Secondary | ICD-10-CM

## 2019-07-31 DIAGNOSIS — M79605 Pain in left leg: Secondary | ICD-10-CM

## 2019-08-05 ENCOUNTER — Encounter (INDEPENDENT_AMBULATORY_CARE_PROVIDER_SITE_OTHER): Payer: Medicaid Other | Admitting: Vascular Surgery

## 2019-08-05 ENCOUNTER — Encounter (INDEPENDENT_AMBULATORY_CARE_PROVIDER_SITE_OTHER): Payer: Medicaid Other

## 2019-09-12 ENCOUNTER — Encounter: Payer: Self-pay | Admitting: Psychiatry

## 2019-09-12 ENCOUNTER — Ambulatory Visit (INDEPENDENT_AMBULATORY_CARE_PROVIDER_SITE_OTHER): Payer: Medicaid Other | Admitting: Psychiatry

## 2019-09-12 ENCOUNTER — Other Ambulatory Visit: Payer: Self-pay

## 2019-09-12 DIAGNOSIS — F3160 Bipolar disorder, current episode mixed, unspecified: Secondary | ICD-10-CM

## 2019-09-12 DIAGNOSIS — F3178 Bipolar disorder, in full remission, most recent episode mixed: Secondary | ICD-10-CM | POA: Diagnosis not present

## 2019-09-12 MED ORDER — LITHIUM CARBONATE 300 MG PO CAPS: 300.0000 mg | ORAL_CAPSULE | Freq: Two times a day (BID) | ORAL | 0 refills | Status: DC

## 2019-09-12 MED ORDER — GABAPENTIN 300 MG PO CAPS: 300.0000 mg | ORAL_CAPSULE | Freq: Every day | ORAL | 0 refills | Status: DC

## 2019-09-12 MED ORDER — OXCARBAZEPINE 300 MG PO TABS: 300.0000 mg | ORAL_TABLET | Freq: Every day | ORAL | 0 refills | Status: DC

## 2019-09-12 MED ORDER — QUETIAPINE FUMARATE 50 MG PO TABS: 50.0000 mg | ORAL_TABLET | Freq: Three times a day (TID) | ORAL | 0 refills | Status: DC

## 2019-09-12 MED ORDER — AMANTADINE HCL 100 MG PO CAPS: 100.0000 mg | ORAL_CAPSULE | Freq: Every day | ORAL | 0 refills | Status: DC
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
## 2019-09-27 DIAGNOSIS — E46 Unspecified protein-calorie malnutrition: Secondary | ICD-10-CM | POA: Insufficient documentation

## 2019-09-27 DIAGNOSIS — Z8673 Personal history of transient ischemic attack (TIA), and cerebral infarction without residual deficits: Secondary | ICD-10-CM | POA: Insufficient documentation

## 2019-09-27 DIAGNOSIS — B192 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: Secondary | ICD-10-CM | POA: Insufficient documentation

## 2019-09-27 DIAGNOSIS — R519 Headache, unspecified: Secondary | ICD-10-CM | POA: Insufficient documentation

## 2019-09-27 DIAGNOSIS — Z7409 Other reduced mobility: Secondary | ICD-10-CM | POA: Insufficient documentation

## 2019-09-27 DIAGNOSIS — Z955 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: Secondary | ICD-10-CM | POA: Insufficient documentation

## 2019-09-27 DIAGNOSIS — F1290 Cannabis use, unspecified, uncomplicated: Secondary | ICD-10-CM | POA: Insufficient documentation

## 2019-09-27 DIAGNOSIS — G40909 Epilepsy, unspecified, not intractable, without status epilepticus: Secondary | ICD-10-CM | POA: Insufficient documentation

## 2019-10-10 ENCOUNTER — Ambulatory Visit (INDEPENDENT_AMBULATORY_CARE_PROVIDER_SITE_OTHER): Payer: Medicaid Other | Admitting: Vascular Surgery

## 2019-10-10 ENCOUNTER — Other Ambulatory Visit: Payer: Self-pay

## 2019-10-10 ENCOUNTER — Ambulatory Visit (INDEPENDENT_AMBULATORY_CARE_PROVIDER_SITE_OTHER): Payer: Medicaid Other

## 2019-10-10 ENCOUNTER — Encounter (INDEPENDENT_AMBULATORY_CARE_PROVIDER_SITE_OTHER): Payer: Self-pay | Admitting: Vascular Surgery

## 2019-10-10 VITALS — BP 168/91 | HR 92 | Resp 16 | Wt 137.0 lb

## 2019-10-10 DIAGNOSIS — I25119 Atherosclerotic heart disease of native coronary artery with unspecified angina pectoris: Secondary | ICD-10-CM | POA: Diagnosis not present

## 2019-10-10 DIAGNOSIS — M79605 Pain in left leg: Secondary | ICD-10-CM

## 2019-10-10 DIAGNOSIS — M79604 Pain in right leg: Secondary | ICD-10-CM | POA: Diagnosis not present

## 2019-10-10 DIAGNOSIS — J449 Chronic obstructive pulmonary disease, unspecified: Secondary | ICD-10-CM | POA: Diagnosis not present

## 2019-10-10 DIAGNOSIS — E7849 Other hyperlipidemia: Secondary | ICD-10-CM

## 2019-10-10 DIAGNOSIS — I70223 Atherosclerosis of native arteries of extremities with rest pain, bilateral legs: Secondary | ICD-10-CM

## 2019-10-10 DIAGNOSIS — I10 Essential (primary) hypertension: Secondary | ICD-10-CM | POA: Diagnosis not present

## 2019-10-14 ENCOUNTER — Telehealth (INDEPENDENT_AMBULATORY_CARE_PROVIDER_SITE_OTHER): Payer: Self-pay

## 2019-10-14 DIAGNOSIS — I739 Peripheral vascular disease, unspecified: Secondary | ICD-10-CM | POA: Insufficient documentation

## 2019-10-14 DIAGNOSIS — I70229 Atherosclerosis of native arteries of extremities with rest pain, unspecified extremity: Secondary | ICD-10-CM | POA: Insufficient documentation
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
## 2019-10-14 NOTE — Telephone Encounter (Signed)
I attempted to contact the patient's spouse [name] to get the patient scheduled for a right leg angio. A message was left for a return call.

## 2019-10-15 ENCOUNTER — Telehealth (INDEPENDENT_AMBULATORY_CARE_PROVIDER_SITE_OTHER): Payer: Self-pay

## 2019-10-15 NOTE — Telephone Encounter (Signed)
Patient's husband returned my call and the patient is now scheduled for a LLE angio with Dr. Delana Meyer on 10/29/19 with a 11:00 am arrival time to the [facility]. Patient will do covid testing on 10/29/19 between 12:30-2:30 pm at the [facility]. Patient is also scheduled for a RLE angio with Dr. Delana Meyer on 11/05/19 with a 11:00 am arrival time to the [facility]. Patient may need to do another covid test before her procedure on 11/05/19. Pre-procedure instructions were discussed and will be mailed to the patient.

## 2019-10-25 ENCOUNTER — Other Ambulatory Visit
Admission: RE | Admit: 2019-10-25 | Discharge: 2019-10-25 | Disposition: A | Discharge status: Home / Self Care | Payer: Medicaid Other | Source: Ambulatory Visit | Attending: Vascular Surgery | Admitting: Vascular Surgery

## 2019-10-25 ENCOUNTER — Other Ambulatory Visit: Payer: Self-pay

## 2019-10-25 DIAGNOSIS — Z01812 Encounter for preprocedural laboratory examination: Secondary | ICD-10-CM | POA: Diagnosis present

## 2019-10-25 DIAGNOSIS — Z20822 Contact with and (suspected) exposure to covid-19: Secondary | ICD-10-CM | POA: Diagnosis not present

## 2019-10-25 LAB — SARS CORONAVIRUS 2 (TAT 6-24 HRS): SARS Coronavirus 2: NEGATIVE

## 2019-10-28 ENCOUNTER — Other Ambulatory Visit (INDEPENDENT_AMBULATORY_CARE_PROVIDER_SITE_OTHER): Payer: Self-pay | Admitting: Nurse Practitioner

## 2019-10-28 NOTE — Telephone Encounter (Signed)
Patient's husband left a message for a return call regarding the patient's procedure 10/29/19. I returned the call and per the patient's husband she has been very much in a uproar concerning having her leg angio with Dr. Delana Meyer on 10/29/19, I asked if she had any questions and she was hesitate to ask. I went through the pre-procedure instructions and also explained that she would not be put to sleep but would be given "twilight anesthesia" and that seemed to help alleviate her concerns.

## 2019-10-29 ENCOUNTER — Encounter
Admission: RE | Disposition: A | Discharge status: Home / Self Care | Payer: Self-pay | Source: Home / Self Care | Attending: Vascular Surgery

## 2019-10-29 ENCOUNTER — Ambulatory Visit
Admission: RE | Admit: 2019-10-29 | Discharge: 2019-10-29 | Disposition: A | Discharge status: Home / Self Care | Payer: Medicaid Other | Attending: Vascular Surgery | Admitting: Vascular Surgery

## 2019-10-29 ENCOUNTER — Other Ambulatory Visit: Payer: Self-pay

## 2019-10-29 ENCOUNTER — Encounter: Payer: Self-pay | Admitting: Vascular Surgery

## 2019-10-29 DIAGNOSIS — Z8249 Family history of ischemic heart disease and other diseases of the circulatory system: Secondary | ICD-10-CM | POA: Insufficient documentation

## 2019-10-29 DIAGNOSIS — Z87820 Personal history of traumatic brain injury: Secondary | ICD-10-CM | POA: Insufficient documentation

## 2019-10-29 DIAGNOSIS — J449 Chronic obstructive pulmonary disease, unspecified: Secondary | ICD-10-CM | POA: Diagnosis not present

## 2019-10-29 DIAGNOSIS — F17210 Nicotine dependence, cigarettes, uncomplicated: Secondary | ICD-10-CM | POA: Insufficient documentation

## 2019-10-29 DIAGNOSIS — Z7982 Long term (current) use of aspirin: Secondary | ICD-10-CM | POA: Insufficient documentation

## 2019-10-29 DIAGNOSIS — I10 Essential (primary) hypertension: Secondary | ICD-10-CM | POA: Diagnosis not present

## 2019-10-29 DIAGNOSIS — G40909 Epilepsy, unspecified, not intractable, without status epilepticus: Secondary | ICD-10-CM | POA: Insufficient documentation

## 2019-10-29 DIAGNOSIS — E7849 Other hyperlipidemia: Secondary | ICD-10-CM | POA: Insufficient documentation

## 2019-10-29 DIAGNOSIS — Z825 Family history of asthma and other chronic lower respiratory diseases: Secondary | ICD-10-CM | POA: Insufficient documentation

## 2019-10-29 DIAGNOSIS — Z950 Presence of cardiac pacemaker: Secondary | ICD-10-CM | POA: Insufficient documentation

## 2019-10-29 DIAGNOSIS — I739 Peripheral vascular disease, unspecified: Secondary | ICD-10-CM

## 2019-10-29 DIAGNOSIS — B182 Chronic viral hepatitis C: Secondary | ICD-10-CM | POA: Diagnosis not present

## 2019-10-29 DIAGNOSIS — I25119 Atherosclerotic heart disease of native coronary artery with unspecified angina pectoris: Secondary | ICD-10-CM | POA: Insufficient documentation

## 2019-10-29 DIAGNOSIS — Z885 Allergy status to narcotic agent status: Secondary | ICD-10-CM | POA: Insufficient documentation

## 2019-10-29 DIAGNOSIS — I70223 Atherosclerosis of native arteries of extremities with rest pain, bilateral legs: Secondary | ICD-10-CM | POA: Diagnosis present

## 2019-10-29 DIAGNOSIS — Z91410 Personal history of adult physical and sexual abuse: Secondary | ICD-10-CM | POA: Insufficient documentation

## 2019-10-29 HISTORY — PX: LOWER EXTREMITY ANGIOGRAPHY: CATH118251

## 2019-10-29 LAB — CREATININE, SERUM
Creatinine, Ser: 0.95 mg/dL (ref 0.44–1.00)
GFR calc Af Amer: >60 mL/min (ref >60)
GFR calc non Af Amer: >60 mL/min (ref >60)

## 2019-10-29 LAB — BUN: BUN: 15 mg/dL (ref 8–23)

## 2019-10-29 SURGERY — LOWER EXTREMITY ANGIOGRAPHY
Anesthesia: Moderate Sedation | Site: Leg Lower | Laterality: Left

## 2019-10-29 MED ORDER — SODIUM CHLORIDE 0.9% FLUSH: 3.0000 mL | INTRAVENOUS | Status: DC | PRN

## 2019-10-29 MED ORDER — SODIUM CHLORIDE 0.9 % IV SOLN: INTRAVENOUS | Status: DC

## 2019-10-29 MED ORDER — CEFAZOLIN SODIUM-DEXTROSE 2-4 GM/100ML-% IV SOLN
INTRAVENOUS | Status: AC
  Administered 2019-10-29: 2 g via INTRAVENOUS
  Filled 2019-10-29: qty 100

## 2019-10-29 MED ORDER — HEPARIN SODIUM (PORCINE) 1000 UNIT/ML IJ SOLN
INTRAMUSCULAR | Status: DC | PRN
  Administered 2019-10-29: 5000 [IU] via INTRAVENOUS

## 2019-10-29 MED ORDER — MIDAZOLAM HCL 2 MG/ML PO SYRP
ORAL_SOLUTION | ORAL | Status: AC
  Filled 2019-10-29: qty 4

## 2019-10-29 MED ORDER — CLOPIDOGREL BISULFATE 75 MG PO TABS
ORAL_TABLET | ORAL | Status: AC
  Administered 2019-10-29: 300 mg via ORAL
  Filled 2019-10-29: qty 4

## 2019-10-29 MED ORDER — FENTANYL CITRATE (PF) 100 MCG/2ML IJ SOLN
INTRAMUSCULAR | Status: DC | PRN
  Administered 2019-10-29: 50 ug via INTRAVENOUS
  Administered 2019-10-29 (×2): 25 ug via INTRAVENOUS

## 2019-10-29 MED ORDER — ACETAMINOPHEN 325 MG PO TABS: 650.0000 mg | ORAL_TABLET | ORAL | Status: DC | PRN

## 2019-10-29 MED ORDER — CLOPIDOGREL BISULFATE 75 MG PO TABS: 75.0000 mg | ORAL_TABLET | Freq: Every day | ORAL | 4 refills | Status: DC

## 2019-10-29 MED ORDER — MIDAZOLAM HCL 5 MG/5ML IJ SOLN
INTRAMUSCULAR | Status: AC
  Filled 2019-10-29: qty 5

## 2019-10-29 MED ORDER — SODIUM CHLORIDE 0.9 % IV SOLN: 250.0000 mL | INTRAVENOUS | Status: DC | PRN

## 2019-10-29 MED ORDER — OXYCODONE HCL 5 MG PO TABS: 5.0000 mg | ORAL_TABLET | ORAL | Status: DC | PRN

## 2019-10-29 MED ORDER — HYDRALAZINE HCL 20 MG/ML IJ SOLN
INTRAMUSCULAR | Status: DC | PRN
  Administered 2019-10-29: 10 mg via INTRAVENOUS

## 2019-10-29 MED ORDER — HEPARIN SODIUM (PORCINE) 1000 UNIT/ML IJ SOLN
INTRAMUSCULAR | Status: AC
  Filled 2019-10-29: qty 1

## 2019-10-29 MED ORDER — CEFAZOLIN SODIUM-DEXTROSE 2-4 GM/100ML-% IV SOLN: 2.0000 g | Freq: Once | INTRAVENOUS | Status: AC

## 2019-10-29 MED ORDER — HYDROMORPHONE HCL 1 MG/ML IJ SOLN: 0.5000 mg | INTRAMUSCULAR | Status: DC | PRN

## 2019-10-29 MED ORDER — DIPHENHYDRAMINE HCL 50 MG/ML IJ SOLN: 50.0000 mg | Freq: Once | INTRAMUSCULAR | Status: DC | PRN

## 2019-10-29 MED ORDER — SODIUM CHLORIDE 0.9% FLUSH: 3.0000 mL | Freq: Two times a day (BID) | INTRAVENOUS | Status: DC

## 2019-10-29 MED ORDER — MIDAZOLAM HCL 2 MG/2ML IJ SOLN
INTRAMUSCULAR | Status: DC | PRN
  Administered 2019-10-29: 1 mg via INTRAVENOUS
  Administered 2019-10-29: 2 mg via INTRAVENOUS
  Administered 2019-10-29: 1 mg via INTRAVENOUS

## 2019-10-29 MED ORDER — HYDRALAZINE HCL 20 MG/ML IJ SOLN: 5.0000 mg | INTRAMUSCULAR | Status: DC | PRN

## 2019-10-29 MED ORDER — HYDROMORPHONE HCL 1 MG/ML IJ SOLN: 0.5000 mg | Freq: Once | INTRAMUSCULAR | Status: AC

## 2019-10-29 MED ORDER — CLOPIDOGREL BISULFATE 300 MG PO TABS: 300.0000 mg | ORAL_TABLET | ORAL | Status: AC

## 2019-10-29 MED ORDER — HYDRALAZINE HCL 20 MG/ML IJ SOLN
INTRAMUSCULAR | Status: AC
  Filled 2019-10-29: qty 1

## 2019-10-29 MED ORDER — IODIXANOL 320 MG/ML IV SOLN
INTRAVENOUS | Status: DC | PRN
  Administered 2019-10-29: 90 mL via INTRA_ARTERIAL

## 2019-10-29 MED ORDER — HYDROMORPHONE HCL 1 MG/ML IJ SOLN
INTRAMUSCULAR | Status: AC
  Administered 2019-10-29: 0.5 mg via INTRAVENOUS
  Filled 2019-10-29: qty 1

## 2019-10-29 MED ORDER — DIPHENHYDRAMINE HCL 50 MG/ML IJ SOLN
INTRAMUSCULAR | Status: DC | PRN
  Administered 2019-10-29: 12.5 mg via INTRAVENOUS

## 2019-10-29 MED ORDER — FENTANYL CITRATE (PF) 100 MCG/2ML IJ SOLN
INTRAMUSCULAR | Status: AC
  Filled 2019-10-29: qty 2

## 2019-10-29 MED ORDER — MIDAZOLAM HCL 2 MG/ML PO SYRP
8.0000 mg | ORAL_SOLUTION | Freq: Once | ORAL | Status: AC | PRN
  Administered 2019-10-29: 8 mg via ORAL

## 2019-10-29 MED ORDER — METHYLPREDNISOLONE SODIUM SUCC 125 MG IJ SOLR: 125.0000 mg | Freq: Once | INTRAMUSCULAR | Status: DC | PRN

## 2019-10-29 MED ORDER — LABETALOL HCL 5 MG/ML IV SOLN
INTRAVENOUS | Status: DC | PRN
  Administered 2019-10-29 (×2): 10 mg via INTRAVENOUS

## 2019-10-29 MED ORDER — ONDANSETRON HCL 4 MG/2ML IJ SOLN: 4.0000 mg | Freq: Four times a day (QID) | INTRAMUSCULAR | Status: DC | PRN

## 2019-10-29 MED ORDER — LABETALOL HCL 5 MG/ML IV SOLN
INTRAVENOUS | Status: AC
  Filled 2019-10-29: qty 4

## 2019-10-29 MED ORDER — FAMOTIDINE 20 MG PO TABS: 40.0000 mg | ORAL_TABLET | Freq: Once | ORAL | Status: DC | PRN

## 2019-10-29 MED ORDER — DIPHENHYDRAMINE HCL 50 MG/ML IJ SOLN
INTRAMUSCULAR | Status: AC
  Filled 2019-10-29: qty 1

## 2019-10-29 MED ORDER — FENTANYL CITRATE (PF) 100 MCG/2ML IJ SOLN: 12.5000 ug | Freq: Once | INTRAMUSCULAR | Status: DC | PRN

## 2019-10-29 MED ORDER — HYDRALAZINE HCL 20 MG/ML IJ SOLN
INTRAMUSCULAR | Status: DC | PRN
  Administered 2019-10-29 (×3): 10 mg via INTRAVENOUS

## 2019-10-29 MED ORDER — LABETALOL HCL 5 MG/ML IV SOLN: 10.0000 mg | INTRAVENOUS | Status: DC | PRN

## 2019-10-29 SURGICAL SUPPLY — 36 items
BALLN DORADO 4X100X135 (BALLOONS) ×2
BALLN DORADO 5X60X80 (BALLOONS) ×2
BALLN LUTONIX DCB 5X40X130 (BALLOONS) ×2
BALLN MUSTANG 7.0X20 75 (BALLOONS) ×2
BALLN ULTRVRSE 4X60X75 (BALLOONS) ×2
BALLN ULTRVRSE 5X60X75C (BALLOONS) ×2
BALLOON DORADO 4X100X135 (BALLOONS) ×1 IMPLANT
BALLOON DORADO 5X60X80 (BALLOONS) ×1 IMPLANT
BALLOON LUTONIX DCB 5X40X130 (BALLOONS) ×1 IMPLANT
BALLOON MUSTANG 7.0X20 75 (BALLOONS) ×1 IMPLANT
BALLOON ULTRVRSE 4X60X75 (BALLOONS) ×1 IMPLANT
BALLOON ULTRVRSE 5X60X75C (BALLOONS) ×1 IMPLANT
CATH BEACON 5 .035 65 KMP TIP (CATHETERS) ×2 IMPLANT
CATH BEACON 5 .035 65 RIM TIP (CATHETERS) ×2 IMPLANT
CATH PIG 70CM (CATHETERS) ×2 IMPLANT
CATH VS15FR (CATHETERS) ×2 IMPLANT
DEVICE PRESTO INFLATION (MISCELLANEOUS) ×4 IMPLANT
DEVICE SAFEGUARD 24CM (GAUZE/BANDAGES/DRESSINGS) ×4 IMPLANT
DEVICE STARCLOSE SE CLOSURE (Vascular Products) ×4 IMPLANT
GLIDEWIRE ADV .035X180CM (WIRE) ×2 IMPLANT
INTRODUCER 7FR 23CM (INTRODUCER) ×4 IMPLANT
NEEDLE ENTRY 21GA 7CM ECHOTIP (NEEDLE) ×2 IMPLANT
PACK ANGIOGRAPHY (CUSTOM PROCEDURE TRAY) ×2 IMPLANT
SET INTRO CAPELLA COAXIAL (SET/KITS/TRAYS/PACK) ×2 IMPLANT
SHEATH BRITE TIP 5FRX11 (SHEATH) ×2 IMPLANT
SHEATH BRITE TIP 7FRX11 (SHEATH) ×2 IMPLANT
STENT LIFESTAR 7X60X80 (Permanent Stent) ×2 IMPLANT
STENT LIFESTENT 5F 6X40X135 (Permanent Stent) ×2 IMPLANT
STENT LIFESTREAM 6X58X80 (Permanent Stent) ×2 IMPLANT
STENT LIFESTREAM 7X26X80 (Permanent Stent) ×2 IMPLANT
STENT LIFESTREAM 7X58X80 (Permanent Stent) ×4 IMPLANT
SYR MEDRAD MARK 7 150ML (SYRINGE) ×2 IMPLANT
TUBING CONTRAST HIGH PRESS 72 (TUBING) ×2 IMPLANT
WIRE J 3MM .035X145CM (WIRE) ×2 IMPLANT
WIRE MAGIC TOR.035 180C (WIRE) ×4 IMPLANT
WIRE MAGIC TORQUE 260C (WIRE) ×2 IMPLANT

## 2019-10-29 NOTE — H&P (Signed)
[facility] History & Physical Update  The patient was interviewed and re-examined.  The patient's previous History and Physical has been reviewed and is unchanged.  There is no change in the plan of care. We plan to proceed with the scheduled procedure.  Hortencia Pilar, MD  10/29/2019, 1:16 PM

## 2019-10-29 NOTE — Discharge Instructions (Signed)
Moderate Conscious Sedation, Adult, Care After These instructions provide you with information about caring for yourself after your procedure. Your health care provider may also give you more specific instructions. Your treatment has been planned according to current medical practices, but problems sometimes occur. Call your health care provider if you have any problems or questions after your procedure. What can I expect after the procedure? After your procedure, it is common:  To feel sleepy for several hours.  To feel clumsy and have poor balance for several hours.  To have poor judgment for several hours.  To vomit if you eat too soon. Follow these instructions at home: For at least 24 hours after the procedure:   Do not: ? Participate in activities where you could fall or become injured. ? Drive. ? Use heavy machinery. ? Drink alcohol. ? Take sleeping pills or medicines that cause drowsiness. ? Make important decisions or sign legal documents. ? Take care of children on your own.  Rest. Eating and drinking  Follow the diet recommended by your health care provider.  If you vomit: ? Drink water, juice, or soup when you can drink without vomiting. ? Make sure you have little or no nausea before eating solid foods. General instructions  Have a responsible adult stay with you until you are awake and alert.  Take over-the-counter and prescription medicines only as told by your health care provider.  If you smoke, do not smoke without supervision.  Keep all follow-up visits as told by your health care provider. This is important. Contact a health care provider if:  You keep feeling nauseous or you keep vomiting.  You feel light-headed.  You develop a rash.  You have a fever. Get help right away if:  You have trouble breathing. This information is not intended to replace advice given to you by your health care provider. Make sure you discuss any questions you have  with your health care provider. Document Revised: 08/25/2017 Document Reviewed: 01/02/2016 Elsevier Patient Education  2020 [employer] Angiogram, Care After This sheet gives you information about how to care for yourself after your procedure. Your doctor may also give you more specific instructions. If you have problems or questions, contact your doctor. Follow these instructions at home: Insertion site care  Follow instructions from your doctor about how to take care of your long, thin tube (catheter) insertion area. Make sure you: ? Wash your hands with soap and water before you change your bandage (dressing). If you cannot use soap and water, use hand sanitizer. ? Change your bandage as told by your doctor. ? Leave stitches (sutures), skin glue, or skin tape (adhesive) strips in place. They may need to stay in place for 2 weeks or longer. If tape strips get loose and curl up, you may trim the loose edges. Do not remove tape strips completely unless your doctor says it is okay.  Do not take baths, swim, or use a hot tub until your doctor says it is okay.  You may shower 24-48 hours after the procedure or as told by your doctor. ? Gently wash the area with plain soap and water. ? Pat the area dry with a clean towel. ? Do not rub the area. This may cause bleeding.  Do not apply powder or lotion to the area. Keep the area clean and dry.  Check your insertion area every day for signs of infection. Check for: ? More redness, swelling, or pain. ? Fluid or blood. ?   Warmth. ? Pus or a bad smell. Activity  Rest as told by your doctor, usually for 1-2 days.  Do not lift anything that is heavier than 10 lbs. (4.5 kg) or as told by your doctor.  Do not drive for 24 hours if you were given a medicine to help you relax (sedative).  Do not drive or use heavy machinery while taking prescription pain medicine. General instructions   Go back to your normal activities as told by your  doctor, usually in about a week. Ask your doctor what activities are safe for you.  If the insertion area starts to bleed, lie flat and put pressure on the area. If the bleeding does not stop, get help right away. This is an emergency.  Drink enough fluid to keep your pee (urine) clear or pale yellow.  Take over-the-counter and prescription medicines only as told by your doctor.  Keep all follow-up visits as told by your doctor. This is important. Contact a doctor if:  You have a fever.  You have chills.  You have more redness, swelling, or pain around your insertion area.  You have fluid or blood coming from your insertion area.  The insertion area feels warm to the touch.  You have pus or a bad smell coming from your insertion area.  You have more bruising around the insertion area.  Blood collects in the tissue around the insertion area (hematoma) that may be painful to the touch. Get help right away if:  You have a lot of pain in the insertion area.  The insertion area swells very fast.  The insertion area is bleeding, and the bleeding does not stop after holding steady pressure on the area.  The area near or just beyond the insertion area becomes pale, cool, tingly, or numb. These symptoms may be an emergency. Do not wait to see if the symptoms will go away. Get medical help right away. Call your local emergency services (911 in the [place] Do not drive yourself to the hospital. Summary  After the procedure, it is common to have bruising and tenderness at the long, thin tube insertion area.  After the procedure, it is important to rest and drink plenty of fluids.  Do not take baths, swim, or use a hot tub until your doctor says it is okay to do so. You may shower 24-48 hours after the procedure or as told by your doctor.  If the insertion area starts to bleed, lie flat and put pressure on the area. If the bleeding does not stop, get help right away. This is an  emergency. This information is not intended to replace advice given to you by your health care provider. Make sure you discuss any questions you have with your health care provider. Document Revised: 08/25/2017 Document Reviewed: 09/06/2016 Elsevier Patient Education  2020 [employer] Inc.  

## 2019-10-29 NOTE — Op Note (Signed)
[facility]  Percutaneous Study/Intervention Procedural Note   Date of Surgery: 10/29/2019  Surgeon:Davina Howlett, Dolores Lory   Pre-operative Diagnosis: Atherosclerotic occlusive disease bilateral lower extremities with rest pain left lower extremity worse than the right  Post-operative diagnosis:  Same  Procedure(s) Performed:  1.  Abdominal aortogram  2.  Bilateral distal runoff  3.  Percutaneous transluminal angioplasty and stent placement right common iliac artery; "kissing balloon" technique  4.  Percutaneous transluminal angioplasty and stent placement left common iliac artery; "kissing balloon" technique  5.   Percutaneous transluminal angioplasty and stent placement left external iliac artery with a 7 mm x 60 mm life star stent postdilated to 7 mm proximally and 5 mm distally             6.  Percutaneous transluminal angioplasty and stent placement right external iliac artery with a 6 mm x 40 mm life stent postdilated to 5 mm.             7.  Ultrasound guided access bilateral common femoral arteries  8.  StarClose closure device bilateral common femoral arteries  Anesthesia: Conscious sedation was administered under my direct supervision by the interventional radiology RN. IV Versed plus fentanyl were utilized. Continuous ECG, pulse oximetry and blood pressure was monitored throughout the entire procedure. Conscious sedation was for a total of 95 minutes.  Sheath: 7 French 23 cm Pinnacle sheaths retrograde bilateral common femoral arteries  Contrast: 90  Fluoroscopy Time: 19.3  Indications: Patient presented to the office with increasing pain of her lower extremities.  Her left was much more severely affected than the right.  This correlated with physical examination as well as noninvasive studies which demonstrated monophasic pedal waveforms bilaterally the right ABI was approximately 0.7 and the left ABI was approximately 0.4.  This suggests she is having  significant rest pain symptoms from her atherosclerotic occlusive disease and is therefore at risk for limb loss.  Risks and benefits for angiography with hope for intervention were reviewed all questions were answered patient agrees to proceed with angiography and intervention for limb salvage.  Procedure:  Cynthia [name] a 64 y.o. female who was identified and appropriate procedural time out was performed.  The patient was then placed supine on the table and prepped and draped in the usual sterile fashion.   Ultrasound was used to evaluate the right common femoral artery.  It was echolucent and pulsatile indicating it is patent .  An ultrasound image was acquired for the permanent record.  A micropuncture needle was used to access the right common femoral artery under direct ultrasound guidance.  The microwire was then advanced under fluoroscopic guidance without difficulty followed by the micro-sheath  A 0.035 J wire was advanced with some resistance and a 5Fr sheath was placed.  For this reason I hand injected through the 5 French sheath identified extensive external and common iliac stenosis and switched from a J-wire to an advantage wire which was then negotiated under direct fluoroscopic guidance into the aorta.  The pigtail catheter was then positioned at the level of T12 and an AP image of the aorta was obtained. After review the images the pigtail catheter was repositioned above the aortic bifurcation and bilateral oblique views of the pelvis were obtained.   After review the images the ultrasound was reprepped and delivered back onto the sterile field. The left common femoral was then imaged with the ultrasound it was noted to be echolucent and pulsatile indicating patency. Images  recorded for the permanent record. Under real-time visualization a microneedle was inserted into the anterior wall the common femoral artery microwire was then advanced without difficulty under fluoroscopic guidance  followed by placement of the micro-sheath.  A short J wire was then negotiated under fluoroscopic guidance into proximal external iliac artery. 7 French sheath was then placed.  5000 units of heparin was given and allowed to circulate for proximally 4 minutes.  The right sheath was then upsized to a 7 [place] sheath as well after an advantage wire was advanced through the pigtail catheter.  A VS 1 catheter was then exchanged for the pigtail catheter and the V S1 seated in the occluded left common iliac.  The wire was then negotiated through the occluded common and external iliac arteries and actually into the 7 French sheath.  Once the wire was advanced all the way to the hub of the sheath the sheath was removed and the wire was then grabbed and pulled farther out.  The 7 French sheath was then reinserted.  A Kumpe catheter was then advanced retrograde over the wire up to the point that the tip of the Kumpe was in the right common iliac.  Next the wire was pulled back out the Kumpe from the right side and then once the wire was free of the Kumpe was negotiated up into the aorta from the right side knowing that this was already intraluminal.  The Kumpe was then manipulated and flips of the was pointing up into the aorta and a Magic torque wire advanced from the left side through the Kumpe up into the aorta.  Kumpe was advanced and hand-injection contrast was then performed to prove intraluminal positioning from the left side.  Magnified images of the aortic bifurcation were then made using hand injection contrast from the femoral sheaths. After appropriate sizing a 7 mm x 58 mm lifestream stent was selected for the right and a 7 mm x 58 mm lifestream stent was selected for the left. There were then advanced and positioned just above the aortic bifurcation. Insufflation for full expansion of the stents was performed simultaneously.  Next a 6 mm x 58 mm lifestream stent was deployed bridging the distal common and  extending into the proximal external iliac on the left.  Finally with the sheath pulled back below the level of the circumflex branches hand-injection of contrast and magnified imaging demonstrated the distal external on the left which was heavily diseased and a 7 mm x 60 mm life star stent was deployed overlapping the lifestream stent and extending the distal margin of the stent down to within a millimeter of the circumflex branches.  The external iliac artery was then postdilated with a 5 mm x 60 mm [place] balloon inflated to 20 atm for approximately 30 seconds.  This still left the iliac bifurcation somewhat undersized and I advanced a 7 mm x 20 mm Ultraverse balloon across this region of the left iliac system and inflated it to 16 atm for 1 minute.  Follow-up imaging from the 7 French sheath in a retrograde fashion now demonstrated wide patency of all 3 stents with retrograde filling into the distal aorta.  Attention was then turned to the right side where a magnified imaging in the LAO projection of the right side follow-up imaging was then performed and the origin of the right internal iliac artery which is her only patent internal iliac was noted subsequently a 7 mm x 26 mm lifestream stent was deployed  extending the original common iliac stent down to within a few millimeters of the origin of the internal iliac.  Sheath was repositioned and hand-injection contrast was then used to demonstrate the right external iliac where a 6 mm x 40 mm life stent was then deployed beginning at the origin of the external and extending it distally.  This was postdilated with a 5 mm x 40 mm Lutonix drug-eluting balloon inflated to 14 atm for approximately 40 seconds.  The pigtail catheter was then introduced up the left side and bolus injection of contrast was used to perform final imaging of the distal aortic reconstruction.  Oblique views were then obtained of the groins in succession and Star close device is deployed  without difficulty bilaterally. There were no immediate complications   Findings:   Aortogram:  The abdominal aorta is opacified with a bolus injection contrast. Demonstrates diffuse disease but there are no hemodynamically significant lesions noted until the distal aortic where there is a flush occlusion of the left common iliac artery.  The occlusion extends down through the entire common iliac into the external iliac there is reconstitution of the last 2 to 3 cm of the left external iliac artery via the circumflex branches.  The left common femoral demonstrates approximately 40 to 50% stenosis which is smooth and located throughout the majority of the common femoral.  Origins of the profunda femoris and the proximal portion of the profunda femoris as well as origin of the SFA and the proximal portion of the SFA appears to be widely patent.  On the right the common iliac demonstrates greater than 80% stenosis throughout the proximal two thirds of the common iliac artery.  A short segment at the level of the iliac bifurcation is patent without hemodynamically significant stenosis in the origin of the internal is patent with a fairly good internal iliac artery on the right.  The origin of the right external iliac artery demonstrates a 70% stenosis which is relatively focal.  Distally the right external iliac artery is patent free of hemodynamically significant stenosis.  The right common femoral is patent as is the origin of the SFA and profunda femoris on the right as well as the visualized portions of the profunda femoris and SFA.   Following placement of the common iliac stents there is now wide patency with less than 10% residual stenosis with rapid flow through the aortic bifurcation bilaterally.  Following angioplasty and stent placement of both the right external iliac artery as well as the left external iliac arteries there is now rapid flow of contrast bilaterally into the common femoral arteries with  less than 10% residual stenosis.  Summary:  Successful reconstruction of the distal aorta and bilateral common and external iliac arteries  Disposition: Patient was taken to the recovery room in stable condition having tolerated the procedure well.  Hortencia Pilar 10/29/2019,3:44 PM

## 2019-10-29 NOTE — Progress Notes (Signed)
When pt pivoted to wheelchair for discharge noted blood on clothes assisted back to bed manual pressure to left groin. Manual hold for additional 10 minutes, redressed with tegaderm dressing.

## 2019-10-30 ENCOUNTER — Encounter: Payer: Self-pay | Admitting: Cardiology

## 2019-11-04 ENCOUNTER — Telehealth (INDEPENDENT_AMBULATORY_CARE_PROVIDER_SITE_OTHER): Payer: Self-pay

## 2019-11-04 NOTE — Telephone Encounter (Signed)
Spoke with the patient's husband and her over the speaker phone and gave them the recommendations from Eulogio Ditch NP. The both seemed to understand and was okay with the information. See note below.

## 2019-11-04 NOTE — Telephone Encounter (Signed)
A small knot with some swelling at the area is normal following angiogram.  This is because there is a closure device that is used to help close where the sheath was introduced.  Bruising is also common.  As long as the knot is not growing in size and firmness it is ok.

## 2019-11-04 NOTE — Telephone Encounter (Signed)
Patient's husband called stating the patient has what he believes to be a knot in her left groin that has appeared since her leg angio with Dr. Delana Meyer on 10/29/19. Patient's husband is unable to tell me the size of the knot. Patient does have an appt on 11/13/19 for ultrasound and to be seen.

## 2019-11-05 ENCOUNTER — Encounter: Admission: RE | Payer: Self-pay | Source: Home / Self Care

## 2019-11-05 ENCOUNTER — Ambulatory Visit: Admission: RE | Admit: 2019-11-05 | Payer: Medicaid Other | Source: Home / Self Care | Admitting: Vascular Surgery

## 2019-11-05 SURGERY — LOWER EXTREMITY ANGIOGRAPHY
Anesthesia: Moderate Sedation | Site: Leg Lower | Laterality: Right

## 2019-11-07 ENCOUNTER — Telehealth: Payer: Self-pay | Admitting: *Deleted

## 2019-11-07 DIAGNOSIS — Z87891 Personal history of nicotine dependence: Secondary | ICD-10-CM

## 2019-11-07 NOTE — Telephone Encounter (Signed)
Patient has been notified that annual lung cancer screening low dose CT scan is due currently or will be in near future. Confirmed that patient is within the age range of 55-77, and asymptomatic, (no signs or symptoms of lung cancer). Patient denies illness that would prevent curative treatment for lung cancer if found. Verified smoking history, (current, 87 pack year). The shared decision making visit was done 11/06/15. Patient is agreeable for CT scan being scheduled.

## 2019-11-12 ENCOUNTER — Other Ambulatory Visit (INDEPENDENT_AMBULATORY_CARE_PROVIDER_SITE_OTHER): Payer: Self-pay | Admitting: Vascular Surgery

## 2019-11-12 DIAGNOSIS — Z95820 Peripheral vascular angioplasty status with implants and grafts: Secondary | ICD-10-CM

## 2019-11-13 ENCOUNTER — Encounter (INDEPENDENT_AMBULATORY_CARE_PROVIDER_SITE_OTHER): Payer: Self-pay | Admitting: Nurse Practitioner

## 2019-11-13 ENCOUNTER — Encounter (INDEPENDENT_AMBULATORY_CARE_PROVIDER_SITE_OTHER): Payer: Medicaid Other

## 2019-11-13 ENCOUNTER — Ambulatory Visit (INDEPENDENT_AMBULATORY_CARE_PROVIDER_SITE_OTHER): Payer: Medicaid Other

## 2019-11-13 ENCOUNTER — Other Ambulatory Visit: Payer: Self-pay

## 2019-11-13 ENCOUNTER — Ambulatory Visit (INDEPENDENT_AMBULATORY_CARE_PROVIDER_SITE_OTHER): Payer: Medicaid Other | Admitting: Nurse Practitioner

## 2019-11-13 VITALS — BP 168/83 | HR 87 | Resp 16 | Wt 138.0 lb

## 2019-11-13 DIAGNOSIS — I10 Essential (primary) hypertension: Secondary | ICD-10-CM

## 2019-11-13 DIAGNOSIS — Z87891 Personal history of nicotine dependence: Secondary | ICD-10-CM | POA: Diagnosis not present

## 2019-11-13 DIAGNOSIS — Z95820 Peripheral vascular angioplasty status with implants and grafts: Secondary | ICD-10-CM

## 2019-11-13 DIAGNOSIS — I70223 Atherosclerosis of native arteries of extremities with rest pain, bilateral legs: Secondary | ICD-10-CM

## 2019-11-18 ENCOUNTER — Encounter (INDEPENDENT_AMBULATORY_CARE_PROVIDER_SITE_OTHER): Payer: Self-pay | Admitting: Nurse Practitioner
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
## 2019-11-19 ENCOUNTER — Ambulatory Visit: Payer: Medicaid Other | Admitting: Family Medicine

## 2019-11-19 ENCOUNTER — Other Ambulatory Visit: Payer: Self-pay

## 2019-11-19 ENCOUNTER — Encounter: Payer: Self-pay | Admitting: Family Medicine

## 2019-11-19 VITALS — BP 130/64 | HR 93 | Temp 98.3°F | Resp 14 | Ht 64.0 in | Wt 140.9 lb

## 2019-11-19 DIAGNOSIS — E875 Hyperkalemia: Secondary | ICD-10-CM

## 2019-11-19 DIAGNOSIS — R739 Hyperglycemia, unspecified: Secondary | ICD-10-CM

## 2019-11-19 DIAGNOSIS — K7030 Alcoholic cirrhosis of liver without ascites: Secondary | ICD-10-CM

## 2019-11-19 DIAGNOSIS — M79604 Pain in right leg: Secondary | ICD-10-CM | POA: Diagnosis not present

## 2019-11-19 DIAGNOSIS — Z716 Tobacco abuse counseling: Secondary | ICD-10-CM

## 2019-11-19 DIAGNOSIS — F17200 Nicotine dependence, unspecified, uncomplicated: Secondary | ICD-10-CM

## 2019-11-19 DIAGNOSIS — Z5181 Encounter for therapeutic drug level monitoring: Secondary | ICD-10-CM

## 2019-11-19 DIAGNOSIS — H9192 Unspecified hearing loss, left ear: Secondary | ICD-10-CM | POA: Diagnosis not present

## 2019-11-19 DIAGNOSIS — R21 Rash and other nonspecific skin eruption: Secondary | ICD-10-CM

## 2019-11-19 DIAGNOSIS — S069X5S Unspecified intracranial injury with loss of consciousness greater than 24 hours with return to pre-existing conscious level, sequela: Secondary | ICD-10-CM

## 2019-11-19 DIAGNOSIS — F3160 Bipolar disorder, current episode mixed, unspecified: Secondary | ICD-10-CM

## 2019-11-19 DIAGNOSIS — D692 Other nonthrombocytopenic purpura: Secondary | ICD-10-CM

## 2019-11-19 DIAGNOSIS — R569 Unspecified convulsions: Secondary | ICD-10-CM

## 2019-11-19 DIAGNOSIS — R269 Unspecified abnormalities of gait and mobility: Secondary | ICD-10-CM

## 2019-11-19 DIAGNOSIS — Z1231 Encounter for screening mammogram for malignant neoplasm of breast: Secondary | ICD-10-CM

## 2019-11-19 DIAGNOSIS — J441 Chronic obstructive pulmonary disease with (acute) exacerbation: Secondary | ICD-10-CM

## 2019-11-19 DIAGNOSIS — R29898 Other symptoms and signs involving the musculoskeletal system: Secondary | ICD-10-CM

## 2019-11-19 DIAGNOSIS — J449 Chronic obstructive pulmonary disease, unspecified: Secondary | ICD-10-CM | POA: Diagnosis not present

## 2019-11-19 DIAGNOSIS — M79605 Pain in left leg: Secondary | ICD-10-CM

## 2019-11-19 DIAGNOSIS — E559 Vitamin D deficiency, unspecified: Secondary | ICD-10-CM

## 2019-11-19 DIAGNOSIS — E7849 Other hyperlipidemia: Secondary | ICD-10-CM

## 2019-11-19 DIAGNOSIS — I10 Essential (primary) hypertension: Secondary | ICD-10-CM

## 2019-11-19 DIAGNOSIS — I739 Peripheral vascular disease, unspecified: Secondary | ICD-10-CM

## 2019-11-19 MED ORDER — NEBULIZER/TUBING/MOUTHPIECE KIT: PACK | 0 refills | Status: DC

## 2019-11-19 MED ORDER — CHANTIX STARTING MONTH PAK 0.5 MG X 11 & 1 MG X 42 PO TABS: ORAL_TABLET | ORAL | 0 refills | Status: DC

## 2019-11-19 MED ORDER — AMLODIPINE BESYLATE 10 MG PO TABS: 10.0000 mg | ORAL_TABLET | Freq: Every day | ORAL | 3 refills | Status: DC

## 2019-11-19 MED ORDER — IPRATROPIUM-ALBUTEROL 0.5-2.5 (3) MG/3ML IN SOLN: 3.0000 mL | Freq: Three times a day (TID) | RESPIRATORY_TRACT | 1 refills | Status: DC | PRN

## 2019-11-19 MED ORDER — MUCINEX 600 MG PO TB12: 600.0000 mg | ORAL_TABLET | Freq: Two times a day (BID) | ORAL | 0 refills | Status: AC

## 2019-11-19 MED ORDER — PREDNISONE 10 MG PO TABS: ORAL_TABLET | ORAL | 0 refills | Status: DC

## 2019-11-19 MED ORDER — BENZONATATE 100 MG PO CAPS: 100.0000 mg | ORAL_CAPSULE | Freq: Three times a day (TID) | ORAL | 0 refills | Status: DC | PRN

## 2019-11-19 MED ORDER — ATORVASTATIN CALCIUM 20 MG PO TABS: 20.0000 mg | ORAL_TABLET | Freq: Every day | ORAL | 3 refills | Status: DC

## 2019-11-19 MED ORDER — LOSARTAN POTASSIUM 50 MG PO TABS: 50.0000 mg | ORAL_TABLET | Freq: Every day | ORAL | 3 refills | Status: DC

## 2019-11-19 MED ORDER — HYDROXYZINE HCL 25 MG PO TABS: 25.0000 mg | ORAL_TABLET | Freq: Three times a day (TID) | ORAL | 1 refills | Status: DC | PRN
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
## 2019-11-19 NOTE — Patient Instructions (Signed)
  Take steroids starting tomorrow morning.  Use mucinex and cough meds as needed  Pick up nebulizer machine and do breathing treatments as needed for cough, wheeze and shortness of breath (up to three times a day)   Return in 2-4 weeks (first available) to do your routine visit  You will follow up with ENT for your hearing  Follow up with Ortho/physical therapy

## 2019-11-19 NOTE — Addendum Note (Signed)
Addended by: Delsa Grana on: 11/19/2019 07:54 PM   Modules accepted: Level of Service

## 2019-11-20 LAB — HEMOGLOBIN A1C
Hgb A1c MFr Bld: 5.3 % of total Hgb (ref <5.7)
Mean Plasma Glucose: 105 (calc)
eAG (mmol/L): 5.8 (calc)

## 2019-11-20 LAB — LIPID PANEL
Cholesterol: 151 mg/dL (ref <200)
HDL: 56 mg/dL (ref >=50)
LDL Cholesterol (Calc): 73 mg/dL (calc)
Non-HDL Cholesterol (Calc): 95 mg/dL (calc) (ref <130)
Total CHOL/HDL Ratio: 2.7 (calc) (ref <5.0)
Triglycerides: 132 mg/dL (ref <150)

## 2019-11-20 LAB — CBC WITH DIFFERENTIAL/PLATELET
Absolute Monocytes: 372 cells/uL (ref 200–950)
Basophils Absolute: 112 cells/uL (ref 0–200)
Basophils Relative: 1.8 %
Eosinophils Absolute: 391 cells/uL (ref 15–500)
Eosinophils Relative: 6.3 %
HCT: 40.5 % (ref 35.0–45.0)
Hemoglobin: 13.5 g/dL (ref 11.7–15.5)
Lymphs Abs: 1860 cells/uL (ref 850–3900)
MCH: 31.5 pg (ref 27.0–33.0)
MCHC: 33.3 g/dL (ref 32.0–36.0)
MCV: 94.6 fL (ref 80.0–100.0)
MPV: 10.3 fL (ref 7.5–12.5)
Monocytes Relative: 6 %
Neutro Abs: 3466 cells/uL (ref 1500–7800)
Neutrophils Relative %: 55.9 %
Platelets: 155 10*3/uL (ref 140–400)
RBC: 4.28 10*6/uL (ref 3.80–5.10)
RDW: 12.6 % (ref 11.0–15.0)
Total Lymphocyte: 30 %
WBC: 6.2 10*3/uL (ref 3.8–10.8)

## 2019-11-20 LAB — COMPLETE METABOLIC PANEL WITH GFR
AG Ratio: 1.2 (calc) (ref 1.0–2.5)
ALT: 8 U/L (ref 6–29)
AST: 12 U/L (ref 10–35)
Albumin: 4.4 g/dL (ref 3.6–5.1)
Alkaline phosphatase (APISO): 65 U/L (ref 37–153)
BUN/Creatinine Ratio: 13 (calc) (ref 6–22)
BUN: 14 mg/dL (ref 7–25)
CO2: 28 mmol/L (ref 20–32)
Calcium: 9.9 mg/dL (ref 8.6–10.4)
Chloride: 106 mmol/L (ref 98–110)
Creat: 1.09 mg/dL — ABNORMAL HIGH (ref 0.50–0.99)
GFR, Est African American: 62 mL/min/{1.73_m2} (ref >=60)
GFR, Est Non African American: 54 mL/min/{1.73_m2} — ABNORMAL LOW (ref >=60)
Globulin: 3.6 g/dL (calc) (ref 1.9–3.7)
Glucose, Bld: 103 mg/dL — ABNORMAL HIGH (ref 65–99)
Potassium: 5.8 mmol/L — ABNORMAL HIGH (ref 3.5–5.3)
Sodium: 139 mmol/L (ref 135–146)
Total Bilirubin: 0.5 mg/dL (ref 0.2–1.2)
Total Protein: 8 g/dL (ref 6.1–8.1)

## 2019-11-20 LAB — VITAMIN D 25 HYDROXY (VIT D DEFICIENCY, FRACTURES): Vit D, 25-Hydroxy: 9 ng/mL — ABNORMAL LOW (ref 30–100)

## 2019-11-21 ENCOUNTER — Ambulatory Visit: Admission: RE | Admit: 2019-11-21 | Payer: Medicaid Other | Source: Ambulatory Visit

## 2019-11-21 MED ORDER — VITAMIN D (ERGOCALCIFEROL) 1.25 MG (50000 UNIT) PO CAPS: 50000.0000 [IU] | ORAL_CAPSULE | ORAL | 0 refills | Status: DC

## 2019-11-21 NOTE — Addendum Note (Signed)
Addended by: Delsa Grana on: 11/21/2019 12:32 PM   Modules accepted: Orders

## 2019-11-25 ENCOUNTER — Ambulatory Visit
Admission: RE | Admit: 2019-11-25 | Discharge: 2019-11-25 | Disposition: A | Discharge status: Home / Self Care | Payer: Medicare Other | Source: Ambulatory Visit | Attending: Oncology | Admitting: Oncology

## 2019-11-25 ENCOUNTER — Other Ambulatory Visit: Payer: Self-pay

## 2019-11-25 DIAGNOSIS — Z87891 Personal history of nicotine dependence: Secondary | ICD-10-CM

## 2019-11-26 LAB — BASIC METABOLIC PANEL WITH GFR
BUN: 15 mg/dL (ref 7–25)
CO2: 22 mmol/L (ref 20–32)
Calcium: 10 mg/dL (ref 8.6–10.4)
Chloride: 106 mmol/L (ref 98–110)
Creat: 0.89 mg/dL (ref 0.50–0.99)
GFR, Est African American: 79 mL/min/{1.73_m2} (ref >=60)
GFR, Est Non African American: 68 mL/min/{1.73_m2} (ref >=60)
Glucose, Bld: 107 mg/dL — ABNORMAL HIGH (ref 65–99)
Potassium: 4.4 mmol/L (ref 3.5–5.3)
Sodium: 137 mmol/L (ref 135–146)

## 2019-11-27 ENCOUNTER — Encounter: Payer: Self-pay | Admitting: *Deleted

## 2019-12-06 ENCOUNTER — Ambulatory Visit (INDEPENDENT_AMBULATORY_CARE_PROVIDER_SITE_OTHER): Payer: Medicare Other | Admitting: Psychiatry

## 2019-12-06 ENCOUNTER — Other Ambulatory Visit: Payer: Self-pay

## 2019-12-06 ENCOUNTER — Encounter: Payer: Self-pay | Admitting: Psychiatry

## 2019-12-06 DIAGNOSIS — F3178 Bipolar disorder, in full remission, most recent episode mixed: Secondary | ICD-10-CM | POA: Diagnosis not present

## 2019-12-06 MED ORDER — AMANTADINE HCL 100 MG PO CAPS: 100.0000 mg | ORAL_CAPSULE | Freq: Every day | ORAL | 0 refills | Status: DC

## 2019-12-06 MED ORDER — OXCARBAZEPINE 300 MG PO TABS: 300.0000 mg | ORAL_TABLET | Freq: Every day | ORAL | 0 refills | Status: DC

## 2019-12-06 MED ORDER — GABAPENTIN 300 MG PO CAPS: 300.0000 mg | ORAL_CAPSULE | Freq: Every day | ORAL | 0 refills | Status: DC

## 2019-12-06 MED ORDER — QUETIAPINE FUMARATE 50 MG PO TABS: 50.0000 mg | ORAL_TABLET | Freq: Three times a day (TID) | ORAL | 0 refills | Status: DC

## 2019-12-06 MED ORDER — LITHIUM CARBONATE 300 MG PO CAPS: 300.0000 mg | ORAL_CAPSULE | Freq: Two times a day (BID) | ORAL | 0 refills | Status: DC
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
## 2019-12-16 ENCOUNTER — Ambulatory Visit: Payer: Medicare Other | Admitting: Family Medicine

## 2019-12-19 ENCOUNTER — Ambulatory Visit: Payer: Medicare Other | Admitting: Family Medicine

## 2020-01-03 ENCOUNTER — Telehealth: Payer: Self-pay

## 2020-01-03 MED ORDER — QUETIAPINE FUMARATE 100 MG PO TABS: ORAL_TABLET | ORAL | 0 refills | Status: DC

## 2020-01-03 NOTE — Telephone Encounter (Signed)
Pharmacist called regarding this patient's Quetiapine 50mg . He stated that patient's insurance limits her to 2 tablets/day (BID) as opposed to TID. TID would require a PA. He stated that the other way it could be done is 100mg  1 1/2 tablets which sounded like that may not require the PA. Please review and advise. Thank you.

## 2020-01-03 NOTE — Telephone Encounter (Signed)
All right.  I am sending a new prescription adjusting the prescription.

## 2020-01-15 NOTE — Telephone Encounter (Signed)
Noted - also verified with pt

## 2020-02-06 ENCOUNTER — Other Ambulatory Visit (INDEPENDENT_AMBULATORY_CARE_PROVIDER_SITE_OTHER): Payer: Self-pay | Admitting: Nurse Practitioner

## 2020-02-06 DIAGNOSIS — Z95828 Presence of other vascular implants and grafts: Secondary | ICD-10-CM

## 2020-02-06 DIAGNOSIS — I70223 Atherosclerosis of native arteries of extremities with rest pain, bilateral legs: Secondary | ICD-10-CM

## 2020-02-10 ENCOUNTER — Encounter (INDEPENDENT_AMBULATORY_CARE_PROVIDER_SITE_OTHER): Payer: Medicare Other

## 2020-02-10 ENCOUNTER — Ambulatory Visit (INDEPENDENT_AMBULATORY_CARE_PROVIDER_SITE_OTHER): Payer: Medicaid Other | Admitting: Vascular Surgery

## 2020-02-15 ENCOUNTER — Other Ambulatory Visit: Payer: Self-pay | Admitting: Family Medicine

## 2020-02-15 DIAGNOSIS — E559 Vitamin D deficiency, unspecified: Secondary | ICD-10-CM

## 2020-02-28 ENCOUNTER — Telehealth (HOSPITAL_COMMUNITY): Payer: Medicare Other | Admitting: Psychiatry

## 2020-02-28 ENCOUNTER — Other Ambulatory Visit: Payer: Self-pay

## 2020-02-28 ENCOUNTER — Telehealth (HOSPITAL_COMMUNITY): Payer: Self-pay | Admitting: Psychiatry

## 2020-02-28 ENCOUNTER — Telehealth: Payer: Medicaid Other | Admitting: Psychiatry

## 2020-02-28 NOTE — Telephone Encounter (Signed)
I called the patient at the time of her scheduled appointment today at 11 AM.  Patient stated that she did not recall who I  was since she has trouble remembering things.  She then stated that since the writer is a stranger to he she is going to hang up. After the patient hung up, the writer dialed the other number belonging to her husband however that call went to voicemail.  Plan: We will wait for the patient and/or her husband to contact the clinic for future medication management appointments.

## 2020-03-13 ENCOUNTER — Other Ambulatory Visit: Payer: Self-pay | Admitting: Family Medicine

## 2020-03-13 DIAGNOSIS — E559 Vitamin D deficiency, unspecified: Secondary | ICD-10-CM

## 2020-03-20 ENCOUNTER — Ambulatory Visit: Payer: Self-pay

## 2020-03-20 ENCOUNTER — Inpatient Hospital Stay
Admission: EM | Admit: 2020-03-20 | Discharge: 2020-04-07 | DRG: 207 | Disposition: A | Discharge status: Skilled Nursing Facility | Payer: Medicare Other | Attending: Internal Medicine | Admitting: Internal Medicine

## 2020-03-20 ENCOUNTER — Emergency Department: Payer: Medicare Other

## 2020-03-20 ENCOUNTER — Other Ambulatory Visit: Payer: Self-pay

## 2020-03-20 DIAGNOSIS — N390 Urinary tract infection, site not specified: Secondary | ICD-10-CM | POA: Diagnosis present

## 2020-03-20 DIAGNOSIS — J9602 Acute respiratory failure with hypercapnia: Secondary | ICD-10-CM | POA: Diagnosis present

## 2020-03-20 DIAGNOSIS — F411 Generalized anxiety disorder: Secondary | ICD-10-CM | POA: Diagnosis present

## 2020-03-20 DIAGNOSIS — Z818 Family history of other mental and behavioral disorders: Secondary | ICD-10-CM

## 2020-03-20 DIAGNOSIS — R140 Abdominal distension (gaseous): Secondary | ICD-10-CM

## 2020-03-20 DIAGNOSIS — E785 Hyperlipidemia, unspecified: Secondary | ICD-10-CM | POA: Diagnosis present

## 2020-03-20 DIAGNOSIS — G40109 Localization-related (focal) (partial) symptomatic epilepsy and epileptic syndromes with simple partial seizures, not intractable, without status epilepticus: Secondary | ICD-10-CM | POA: Diagnosis present

## 2020-03-20 DIAGNOSIS — J441 Chronic obstructive pulmonary disease with (acute) exacerbation: Secondary | ICD-10-CM | POA: Diagnosis present

## 2020-03-20 DIAGNOSIS — B182 Chronic viral hepatitis C: Secondary | ICD-10-CM | POA: Diagnosis present

## 2020-03-20 DIAGNOSIS — G92 Toxic encephalopathy: Secondary | ICD-10-CM | POA: Diagnosis present

## 2020-03-20 DIAGNOSIS — Z91410 Personal history of adult physical and sexual abuse: Secondary | ICD-10-CM

## 2020-03-20 DIAGNOSIS — G8929 Other chronic pain: Secondary | ICD-10-CM | POA: Diagnosis present

## 2020-03-20 DIAGNOSIS — F17210 Nicotine dependence, cigarettes, uncomplicated: Secondary | ICD-10-CM | POA: Diagnosis present

## 2020-03-20 DIAGNOSIS — E876 Hypokalemia: Secondary | ICD-10-CM | POA: Diagnosis present

## 2020-03-20 DIAGNOSIS — R0602 Shortness of breath: Secondary | ICD-10-CM

## 2020-03-20 DIAGNOSIS — F4310 Post-traumatic stress disorder, unspecified: Secondary | ICD-10-CM | POA: Diagnosis present

## 2020-03-20 DIAGNOSIS — J440 Chronic obstructive pulmonary disease with acute lower respiratory infection: Secondary | ICD-10-CM | POA: Diagnosis present

## 2020-03-20 DIAGNOSIS — N182 Chronic kidney disease, stage 2 (mild): Secondary | ICD-10-CM | POA: Diagnosis present

## 2020-03-20 DIAGNOSIS — Z885 Allergy status to narcotic agent status: Secondary | ICD-10-CM

## 2020-03-20 DIAGNOSIS — R001 Bradycardia, unspecified: Secondary | ICD-10-CM | POA: Diagnosis not present

## 2020-03-20 DIAGNOSIS — J189 Pneumonia, unspecified organism: Principal | ICD-10-CM | POA: Diagnosis present

## 2020-03-20 DIAGNOSIS — J9600 Acute respiratory failure, unspecified whether with hypoxia or hypercapnia: Secondary | ICD-10-CM

## 2020-03-20 DIAGNOSIS — I129 Hypertensive chronic kidney disease with stage 1 through stage 4 chronic kidney disease, or unspecified chronic kidney disease: Secondary | ICD-10-CM | POA: Diagnosis present

## 2020-03-20 DIAGNOSIS — K7460 Unspecified cirrhosis of liver: Secondary | ICD-10-CM | POA: Diagnosis present

## 2020-03-20 DIAGNOSIS — N179 Acute kidney failure, unspecified: Secondary | ICD-10-CM | POA: Diagnosis present

## 2020-03-20 DIAGNOSIS — I10 Essential (primary) hypertension: Secondary | ICD-10-CM | POA: Diagnosis present

## 2020-03-20 DIAGNOSIS — Z7982 Long term (current) use of aspirin: Secondary | ICD-10-CM

## 2020-03-20 DIAGNOSIS — F3178 Bipolar disorder, in full remission, most recent episode mixed: Secondary | ICD-10-CM | POA: Diagnosis present

## 2020-03-20 DIAGNOSIS — E1122 Type 2 diabetes mellitus with diabetic chronic kidney disease: Secondary | ICD-10-CM | POA: Diagnosis present

## 2020-03-20 DIAGNOSIS — S069XAA Unspecified intracranial injury with loss of consciousness status unknown, initial encounter: Secondary | ICD-10-CM | POA: Diagnosis present

## 2020-03-20 DIAGNOSIS — F319 Bipolar disorder, unspecified: Secondary | ICD-10-CM | POA: Diagnosis present

## 2020-03-20 DIAGNOSIS — Z20822 Contact with and (suspected) exposure to covid-19: Secondary | ICD-10-CM | POA: Diagnosis present

## 2020-03-20 DIAGNOSIS — Z950 Presence of cardiac pacemaker: Secondary | ICD-10-CM | POA: Diagnosis present

## 2020-03-20 DIAGNOSIS — I2510 Atherosclerotic heart disease of native coronary artery without angina pectoris: Secondary | ICD-10-CM | POA: Diagnosis present

## 2020-03-20 DIAGNOSIS — I441 Atrioventricular block, second degree: Secondary | ICD-10-CM | POA: Diagnosis present

## 2020-03-20 DIAGNOSIS — Z716 Tobacco abuse counseling: Secondary | ICD-10-CM

## 2020-03-20 DIAGNOSIS — F1290 Cannabis use, unspecified, uncomplicated: Secondary | ICD-10-CM | POA: Diagnosis present

## 2020-03-20 DIAGNOSIS — J9601 Acute respiratory failure with hypoxia: Secondary | ICD-10-CM

## 2020-03-20 DIAGNOSIS — I480 Paroxysmal atrial fibrillation: Secondary | ICD-10-CM | POA: Diagnosis present

## 2020-03-20 DIAGNOSIS — Z8249 Family history of ischemic heart disease and other diseases of the circulatory system: Secondary | ICD-10-CM

## 2020-03-20 DIAGNOSIS — H518 Other specified disorders of binocular movement: Secondary | ICD-10-CM | POA: Diagnosis present

## 2020-03-20 DIAGNOSIS — Z8673 Personal history of transient ischemic attack (TIA), and cerebral infarction without residual deficits: Secondary | ICD-10-CM

## 2020-03-20 DIAGNOSIS — E1151 Type 2 diabetes mellitus with diabetic peripheral angiopathy without gangrene: Secondary | ICD-10-CM | POA: Diagnosis present

## 2020-03-20 DIAGNOSIS — S069X9A Unspecified intracranial injury with loss of consciousness of unspecified duration, initial encounter: Secondary | ICD-10-CM | POA: Diagnosis present

## 2020-03-20 DIAGNOSIS — R471 Dysarthria and anarthria: Secondary | ICD-10-CM | POA: Diagnosis present

## 2020-03-20 DIAGNOSIS — Z87820 Personal history of traumatic brain injury: Secondary | ICD-10-CM | POA: Diagnosis present

## 2020-03-20 DIAGNOSIS — I2729 Other secondary pulmonary hypertension: Secondary | ICD-10-CM | POA: Diagnosis present

## 2020-03-20 DIAGNOSIS — Z7902 Long term (current) use of antithrombotics/antiplatelets: Secondary | ICD-10-CM

## 2020-03-20 DIAGNOSIS — Z789 Other specified health status: Secondary | ICD-10-CM

## 2020-03-20 DIAGNOSIS — Z79899 Other long term (current) drug therapy: Secondary | ICD-10-CM

## 2020-03-20 DIAGNOSIS — I2781 Cor pulmonale (chronic): Secondary | ICD-10-CM | POA: Diagnosis present

## 2020-03-20 HISTORY — DX: Pneumonia, unspecified organism: J18.9

## 2020-03-20 HISTORY — DX: Acute kidney failure, unspecified: N17.9

## 2020-03-20 HISTORY — DX: Acute respiratory failure, unspecified whether with hypoxia or hypercapnia: J96.00

## 2020-03-20 LAB — COMPREHENSIVE METABOLIC PANEL
ALT: 8 U/L (ref 0–44)
AST: 15 U/L (ref 15–41)
Albumin: 3.4 g/dL — ABNORMAL LOW (ref 3.5–5.0)
Alkaline Phosphatase: 45 U/L (ref 38–126)
Anion gap: 12 (ref 5–15)
BUN: 48 mg/dL — ABNORMAL HIGH (ref 8–23)
CO2: 20 mmol/L — ABNORMAL LOW (ref 22–32)
Calcium: 9.5 mg/dL (ref 8.9–10.3)
Chloride: 99 mmol/L (ref 98–111)
Creatinine, Ser: 3.1 mg/dL — ABNORMAL HIGH (ref 0.44–1.00)
GFR calc Af Amer: 17 mL/min — ABNORMAL LOW (ref >60)
GFR calc non Af Amer: 15 mL/min — ABNORMAL LOW (ref >60)
Glucose, Bld: 127 mg/dL — ABNORMAL HIGH (ref 70–99)
Potassium: 3.8 mmol/L (ref 3.5–5.1)
Sodium: 131 mmol/L — ABNORMAL LOW (ref 135–145)
Total Bilirubin: 0.9 mg/dL (ref 0.3–1.2)
Total Protein: 7.4 g/dL (ref 6.5–8.1)

## 2020-03-20 LAB — CBC
HCT: 42.6 % (ref 36.0–46.0)
Hemoglobin: 13.9 g/dL (ref 12.0–15.0)
MCH: 30.5 pg (ref 26.0–34.0)
MCHC: 32.6 g/dL (ref 30.0–36.0)
MCV: 93.6 fL (ref 80.0–100.0)
Platelets: 126 10*3/uL — ABNORMAL LOW (ref 150–400)
RBC: 4.55 MIL/uL (ref 3.87–5.11)
RDW: 13.9 % (ref 11.5–15.5)
WBC: 6.8 10*3/uL (ref 4.0–10.5)
nRBC: 0 % (ref 0.0–0.2)

## 2020-03-20 LAB — URINALYSIS, COMPLETE (UACMP) WITH MICROSCOPIC
Bilirubin Urine: NEGATIVE
Glucose, UA: NEGATIVE mg/dL
Ketones, ur: NEGATIVE mg/dL
Nitrite: NEGATIVE
Protein, ur: 100 mg/dL — AB
Specific Gravity, Urine: 1.015 (ref 1.005–1.030)
WBC, UA: >50 WBC/hpf — ABNORMAL HIGH (ref 0–5)
pH: 6 (ref 5.0–8.0)

## 2020-03-20 LAB — SARS CORONAVIRUS 2 BY RT PCR (HOSPITAL ORDER, PERFORMED IN ~~LOC~~ HOSPITAL LAB): SARS Coronavirus 2: NEGATIVE

## 2020-03-20 LAB — LACTIC ACID, PLASMA: Lactic Acid, Venous: 3.7 mmol/L (ref 0.5–1.9)

## 2020-03-20 LAB — PROCALCITONIN: Procalcitonin: 4.16 ng/mL

## 2020-03-20 LAB — LIPASE, BLOOD: Lipase: 16 U/L (ref 11–51)

## 2020-03-20 MED ORDER — SODIUM CHLORIDE 0.9 % IV SOLN
2.0000 g | Freq: Once | INTRAVENOUS | Status: AC
  Administered 2020-03-20: 2 g via INTRAVENOUS
  Filled 2020-03-20: qty 2

## 2020-03-20 MED ORDER — IPRATROPIUM-ALBUTEROL 0.5-2.5 (3) MG/3ML IN SOLN
3.0000 mL | Freq: Once | RESPIRATORY_TRACT | Status: AC
  Administered 2020-03-20: 3 mL via RESPIRATORY_TRACT
  Filled 2020-03-20: qty 3

## 2020-03-20 MED ORDER — SODIUM CHLORIDE 0.9 % IV SOLN
500.0000 mg | Freq: Once | INTRAVENOUS | Status: AC
  Administered 2020-03-20: 500 mg via INTRAVENOUS
  Filled 2020-03-20: qty 500

## 2020-03-20 MED ORDER — LACTATED RINGERS IV BOLUS
1000.0000 mL | Freq: Once | INTRAVENOUS | Status: AC
  Administered 2020-03-20: 1000 mL via INTRAVENOUS

## 2020-03-20 MED ORDER — METHYLPREDNISOLONE SODIUM SUCC 125 MG IJ SOLR
60.0000 mg | Freq: Once | INTRAMUSCULAR | Status: AC
  Administered 2020-03-20: 60 mg via INTRAVENOUS
  Filled 2020-03-20: qty 2

## 2020-03-20 MED ORDER — SODIUM CHLORIDE 0.9 % IV SOLN: Freq: Once | INTRAVENOUS | Status: AC

## 2020-03-20 NOTE — Progress Notes (Signed)
PHARMACY -  BRIEF ANTIBIOTIC NOTE   Pharmacy has received consult(s) for Cefepime from an ED provider.  The patient's profile has been reviewed for ht/wt/allergies/indication/available labs.    One time order(s) placed for Cefepime 2 gm IV X 1   Further antibiotics/pharmacy consults should be ordered by admitting physician if indicated.                       Thank you, Cynthia [name] D 03/20/2020  9:39 PM

## 2020-03-20 NOTE — Subjective & Objective (Signed)
CC: cough, abd pain, SOB HPI: 64 year old female extensive mental health history including bipolar disorder, history of traumatic brain disorder, hypertension, coronary disease, peripheral vascular disease, COPD, chronic tobacco abuse, seizure disorder presents the ER today with several days of worsening shortness of breath and a cough.  Patient started having abdominal pain today.  No fevers.  Patient states that she has felt chilled at home.  Husband came home from work.  Patient has been having abdominal pain.  He called EMS.  On arrival, patient noted to be hypoxic with room air saturations of low 80s.  Patient placed on 2 L of oxygen.  CT of her abdomen pelvis demonstrated a dense left lower lobe pneumonia.  Her chemistry is remarkable for creatinine of 3.1 and a BUN of 48.  3 months ago her creatinine was 0.89 with a BUN of 15.  Lactic acid is also elevated 3.7.  Procalcitonin was elevated at 4.16.  Patient was given IV cefepime and Zithromax.  Blood cultures obtained.  Due to the patient's acute kidney injury, left lower lobe pneumonia, [facility] hospitalist contacted for admission.

## 2020-03-20 NOTE — Assessment & Plan Note (Signed)
Admit to med/tele bed. IV rocephin, PO zithromax.  Blood cx obtained in ER.  Incentive spirometry/pulmonary toileting.

## 2020-03-20 NOTE — ED Notes (Signed)
Pt used bedpan and urinated 132mL dark yellow urine.Specimen obtained and sent to lab.
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
## 2020-03-20 NOTE — Assessment & Plan Note (Signed)
Stable

## 2020-03-20 NOTE — Assessment & Plan Note (Signed)
Chronic. 

## 2020-03-20 NOTE — Assessment & Plan Note (Signed)
Continue with supplemental O2. Husband(keith) states he cannot afford pt's co-pay for supplemental O2. Pt has needed supplemental O2 for at least 6 months according to husband. But pt can afford to continue to smoke 1 ppd.

## 2020-03-20 NOTE — Assessment & Plan Note (Signed)
Continue with duonebs q6h. Pt given IV Solumedrol in ER. Continue with po prednisone.

## 2020-03-20 NOTE — ED Triage Notes (Signed)
Patient c/o periumbilical abdominal pain begining yesterday. Patient has not had a BM in 3 days; normal per patient.

## 2020-03-20 NOTE — Assessment & Plan Note (Signed)
Continue her home seizure meds.

## 2020-03-20 NOTE — Telephone Encounter (Signed)
Pt's husband called to report pt. C/o severe abdominal pain that comes and goes.  Pt. was able to talk to Triage nurse, and state her symptoms.  C/o severe generalized abdominal pain, up to her breasts.  Rated pain at 10/10.  Pain "comes and goes".  Denied any radiation of pain into chest.  Reported radiation of pain into the back, bilaterally.  Denied having this type of pain before. C/o abdomen feeling somewhat tight. Husband stated "I can tell it's swollen."  Pt. denied nausea/ vomiting.  Reported she is having low grade fever, but has not checked it with thermometer.  C/o constipation.  Pt. reported small BM today, but is unsure when her last good BM was.  Husband stated "she does a lot of coughing and I wonder if she has strained some muscles".  Reported she coughs up phlegm.  Also stated she is always short of breath. Husband reported she was supposed to start oxygen, but he could not afford the out of pocket expense.  Advised to go to ER now, due to severity of abdominal pain.  Care advice given per protocol.  Verb. Understanding.  Agreed with recommendation.      Reason for Disposition . [1] SEVERE pain AND [2] age > 60 years  Answer Assessment - Initial Assessment Questions 1. LOCATION: "Where does it hurt?"      Across abdominal  2. RADIATION: "Does the pain shoot anywhere else?" (e.g., chest, back)    Radiates to back  3. ONSET: "When did the pain begin?" (e.g., minutes, hours or days ago)      6/23 or 6/24 4. SUDDEN: "Gradual or sudden onset?"     Sudden onset 5. PATTERN "Does the pain come and go, or is it constant?"    - If constant: "Is it getting better, staying the same, or worsening?"      (Note: Constant means the pain never goes away completely; most serious pain is constant and it progresses)     - If intermittent: "How long does it last?" "Do you have pain now?"     (Note: Intermittent means the pain goes away completely between bouts)     Comes and goes 6. SEVERITY: "How  bad is the pain?"  (e.g., Scale 1-10; mild, moderate, or severe)   - MILD (1-3): doesn't interfere with normal activities, abdomen soft and not tender to touch    - MODERATE (4-7): interferes with normal activities or awakens from sleep, tender to touch    - SEVERE (8-10): excruciating pain, doubled over, unable to do any normal activities      10/10 7. RECURRENT SYMPTOM: "Have you ever had this type of stomach pain before?" If Yes, ask: "When was the last time?" and "What happened that time?"      Abdominal pain is new 8. CAUSE: "What do you think is causing the stomach pain?"     *No Answer* 9. RELIEVING/AGGRAVATING FACTORS: "What makes it better or worse?" (e.g., movement, antacids, bowel movement)     *No Answer* 10. OTHER SYMPTOMS: "Has there been any vomiting, diarrhea, constipation, or urine problems?"       Some tightness in abd. ; denied n/v; small BM today;  Constipated; unsure when last good BM was; pt. stated she thinks she is having a low grade fever; husband said pt. Does a lot of coughing; husband says pt. Is always short of breath 11. PREGNANCY: "Is there any chance you are pregnant?" "When was your last menstrual period?"  N/a  Protocols used: ABDOMINAL PAIN - [facility]

## 2020-03-20 NOTE — ED Notes (Signed)
When patient asked if she lives alone patient answered "no, that was my husband". When asked who she was referring to patient indicated the EMS medic that just left the room. This RN confirmed that the medic was not her husband. Patient remained convinced that her husband, and not EMS brought her. Patient also speaking with slurred speech.

## 2020-03-20 NOTE — Assessment & Plan Note (Signed)
Due to low BP in ER, will hold HTN meds for now.
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
## 2020-03-20 NOTE — ED Notes (Signed)
Patient transported to CT 

## 2020-03-20 NOTE — ED Notes (Signed)
Critical lactic: 3.7, EDP aware.

## 2020-03-20 NOTE — ED Notes (Signed)
Admitting MD bedside

## 2020-03-20 NOTE — Assessment & Plan Note (Signed)
Hold her ARB. Continue with IVF. Husband states he given her ibuprofen every day due to pt having a headache. Hold NSAIDs for now. Repeat CMP in AM.

## 2020-03-21 ENCOUNTER — Inpatient Hospital Stay: Payer: Medicare Other

## 2020-03-21 DIAGNOSIS — J9601 Acute respiratory failure with hypoxia: Secondary | ICD-10-CM | POA: Diagnosis not present

## 2020-03-21 DIAGNOSIS — F3170 Bipolar disorder, currently in remission, most recent episode unspecified: Secondary | ICD-10-CM

## 2020-03-21 DIAGNOSIS — J441 Chronic obstructive pulmonary disease with (acute) exacerbation: Secondary | ICD-10-CM | POA: Diagnosis not present

## 2020-03-21 DIAGNOSIS — Z950 Presence of cardiac pacemaker: Secondary | ICD-10-CM | POA: Diagnosis not present

## 2020-03-21 DIAGNOSIS — J189 Pneumonia, unspecified organism: Principal | ICD-10-CM

## 2020-03-21 DIAGNOSIS — N179 Acute kidney failure, unspecified: Secondary | ICD-10-CM | POA: Diagnosis not present

## 2020-03-21 LAB — CBC WITH DIFFERENTIAL/PLATELET
Abs Immature Granulocytes: 1.25 10*3/uL — ABNORMAL HIGH (ref 0.00–0.07)
Basophils Absolute: 0.1 10*3/uL (ref 0.0–0.1)
Basophils Relative: 2 %
Eosinophils Absolute: 0 10*3/uL (ref 0.0–0.5)
Eosinophils Relative: 0 %
HCT: 42 % (ref 36.0–46.0)
Hemoglobin: 13.4 g/dL (ref 12.0–15.0)
Immature Granulocytes: 24 %
Lymphocytes Relative: 5 %
Lymphs Abs: 0.3 10*3/uL — ABNORMAL LOW (ref 0.7–4.0)
MCH: 30 pg (ref 26.0–34.0)
MCHC: 31.9 g/dL (ref 30.0–36.0)
MCV: 94.2 fL (ref 80.0–100.0)
Monocytes Absolute: 0.2 10*3/uL (ref 0.1–1.0)
Monocytes Relative: 3 %
Neutro Abs: 3.5 10*3/uL (ref 1.7–7.7)
Neutrophils Relative %: 66 %
Platelets: 113 10*3/uL — ABNORMAL LOW (ref 150–400)
RBC: 4.46 MIL/uL (ref 3.87–5.11)
RDW: 13.9 % (ref 11.5–15.5)
WBC: 5.2 10*3/uL (ref 4.0–10.5)
nRBC: 0 % (ref 0.0–0.2)

## 2020-03-21 LAB — CBC
HCT: 39 % (ref 36.0–46.0)
Hemoglobin: 12.6 g/dL (ref 12.0–15.0)
MCH: 30.3 pg (ref 26.0–34.0)
MCHC: 32.3 g/dL (ref 30.0–36.0)
MCV: 93.8 fL (ref 80.0–100.0)
Platelets: 104 10*3/uL — ABNORMAL LOW (ref 150–400)
RBC: 4.16 MIL/uL (ref 3.87–5.11)
RDW: 14 % (ref 11.5–15.5)
WBC: 3.7 10*3/uL — ABNORMAL LOW (ref 4.0–10.5)
nRBC: 0 % (ref 0.0–0.2)

## 2020-03-21 LAB — COMPREHENSIVE METABOLIC PANEL
ALT: 7 U/L (ref 0–44)
ALT: 7 U/L (ref 0–44)
AST: 13 U/L — ABNORMAL LOW (ref 15–41)
AST: 12 U/L — ABNORMAL LOW (ref 15–41)
Albumin: 3.1 g/dL — ABNORMAL LOW (ref 3.5–5.0)
Albumin: 2.9 g/dL — ABNORMAL LOW (ref 3.5–5.0)
Alkaline Phosphatase: 41 U/L (ref 38–126)
Alkaline Phosphatase: 34 U/L — ABNORMAL LOW (ref 38–126)
Anion gap: 10 (ref 5–15)
Anion gap: 8 (ref 5–15)
BUN: 47 mg/dL — ABNORMAL HIGH (ref 8–23)
BUN: 46 mg/dL — ABNORMAL HIGH (ref 8–23)
CO2: 19 mmol/L — ABNORMAL LOW (ref 22–32)
CO2: 20 mmol/L — ABNORMAL LOW (ref 22–32)
Calcium: 9.2 mg/dL (ref 8.9–10.3)
Calcium: 9 mg/dL (ref 8.9–10.3)
Chloride: 105 mmol/L (ref 98–111)
Chloride: 108 mmol/L (ref 98–111)
Creatinine, Ser: 2.55 mg/dL — ABNORMAL HIGH (ref 0.44–1.00)
Creatinine, Ser: 2.41 mg/dL — ABNORMAL HIGH (ref 0.44–1.00)
GFR calc Af Amer: 22 mL/min — ABNORMAL LOW (ref >60)
GFR calc Af Amer: 24 mL/min — ABNORMAL LOW (ref >60)
GFR calc non Af Amer: 19 mL/min — ABNORMAL LOW (ref >60)
GFR calc non Af Amer: 20 mL/min — ABNORMAL LOW (ref >60)
Glucose, Bld: 160 mg/dL — ABNORMAL HIGH (ref 70–99)
Glucose, Bld: 171 mg/dL — ABNORMAL HIGH (ref 70–99)
Potassium: 4.2 mmol/L (ref 3.5–5.1)
Potassium: 4.2 mmol/L (ref 3.5–5.1)
Sodium: 134 mmol/L — ABNORMAL LOW (ref 135–145)
Sodium: 136 mmol/L (ref 135–145)
Total Bilirubin: 0.9 mg/dL (ref 0.3–1.2)
Total Bilirubin: 0.9 mg/dL (ref 0.3–1.2)
Total Protein: 7.1 g/dL (ref 6.5–8.1)
Total Protein: 6.4 g/dL — ABNORMAL LOW (ref 6.5–8.1)

## 2020-03-21 LAB — AMMONIA: Ammonia: 25 umol/L (ref 9–35)

## 2020-03-21 LAB — HIV ANTIBODY (ROUTINE TESTING W REFLEX): HIV Screen 4th Generation wRfx: NONREACTIVE

## 2020-03-21 LAB — LACTIC ACID, PLASMA
Lactic Acid, Venous: 2.2 mmol/L (ref 0.5–1.9)
Lactic Acid, Venous: 2.3 mmol/L (ref 0.5–1.9)
Lactic Acid, Venous: 3.1 mmol/L (ref 0.5–1.9)

## 2020-03-21 LAB — GLUCOSE, CAPILLARY: Glucose-Capillary: 146 mg/dL — ABNORMAL HIGH (ref 70–99)

## 2020-03-21 LAB — MRSA PCR SCREENING: MRSA by PCR: NEGATIVE

## 2020-03-21 MED ORDER — SENNA 8.6 MG PO TABS
1.0000 | ORAL_TABLET | Freq: Every day | ORAL | Status: DC
  Administered 2020-03-21 – 2020-03-26 (×5): 8.6 mg via ORAL
  Filled 2020-03-21 (×5): qty 1

## 2020-03-21 MED ORDER — IPRATROPIUM-ALBUTEROL 0.5-2.5 (3) MG/3ML IN SOLN
3.0000 mL | Freq: Four times a day (QID) | RESPIRATORY_TRACT | Status: DC
  Administered 2020-03-21 (×2): 3 mL via RESPIRATORY_TRACT
  Filled 2020-03-21 (×2): qty 3

## 2020-03-21 MED ORDER — SODIUM CHLORIDE 0.9 % IV BOLUS
500.0000 mL | Freq: Once | INTRAVENOUS | Status: AC
  Administered 2020-03-21: 500 mL via INTRAVENOUS

## 2020-03-21 MED ORDER — CLOPIDOGREL BISULFATE 75 MG PO TABS
75.0000 mg | ORAL_TABLET | Freq: Every day | ORAL | Status: DC
  Administered 2020-03-21 – 2020-03-26 (×5): 75 mg via ORAL
  Filled 2020-03-21 (×5): qty 1

## 2020-03-21 MED ORDER — AMIODARONE HCL IN DEXTROSE 360-4.14 MG/200ML-% IV SOLN
60.0000 mg/h | INTRAVENOUS | Status: DC
  Administered 2020-03-21 (×2): 60 mg/h via INTRAVENOUS
  Filled 2020-03-21: qty 200

## 2020-03-21 MED ORDER — SODIUM CHLORIDE 0.9 % IV SOLN
2.0000 g | INTRAVENOUS | Status: AC
  Administered 2020-03-21 – 2020-03-25 (×5): 2 g via INTRAVENOUS
  Filled 2020-03-21: qty 20
  Filled 2020-03-21 (×5): qty 2

## 2020-03-21 MED ORDER — ENSURE ENLIVE PO LIQD
237.0000 mL | Freq: Two times a day (BID) | ORAL | Status: DC
  Administered 2020-03-21 – 2020-03-24 (×5): 237 mL via ORAL

## 2020-03-21 MED ORDER — PREDNISONE 20 MG PO TABS
40.0000 mg | ORAL_TABLET | Freq: Every day | ORAL | Status: DC
  Administered 2020-03-21: 40 mg via ORAL
  Filled 2020-03-21: qty 2

## 2020-03-21 MED ORDER — QUETIAPINE FUMARATE 25 MG PO TABS
100.0000 mg | ORAL_TABLET | Freq: Every day | ORAL | Status: DC
  Administered 2020-03-21 – 2020-03-26 (×6): 100 mg via ORAL
  Filled 2020-03-21 (×6): qty 4

## 2020-03-21 MED ORDER — AMIODARONE LOAD VIA INFUSION
150.0000 mg | Freq: Once | INTRAVENOUS | Status: AC
  Administered 2020-03-21: 150 mg via INTRAVENOUS
  Filled 2020-03-21: qty 83.34

## 2020-03-21 MED ORDER — AZITHROMYCIN 500 MG PO TABS
500.0000 mg | ORAL_TABLET | Freq: Every day | ORAL | Status: AC
  Administered 2020-03-21 – 2020-03-25 (×4): 500 mg via ORAL
  Filled 2020-03-21 (×5): qty 1

## 2020-03-21 MED ORDER — METOPROLOL TARTRATE 5 MG/5ML IV SOLN
INTRAVENOUS | Status: AC
  Filled 2020-03-21: qty 5

## 2020-03-21 MED ORDER — METHYLPREDNISOLONE SODIUM SUCC 40 MG IJ SOLR
40.0000 mg | Freq: Four times a day (QID) | INTRAMUSCULAR | Status: DC
  Administered 2020-03-21: 40 mg via INTRAVENOUS
  Filled 2020-03-21: qty 1

## 2020-03-21 MED ORDER — GABAPENTIN 300 MG PO CAPS
300.0000 mg | ORAL_CAPSULE | Freq: Every day | ORAL | Status: DC
  Administered 2020-03-21 – 2020-03-26 (×5): 300 mg via ORAL
  Filled 2020-03-21 (×5): qty 1

## 2020-03-21 MED ORDER — AMANTADINE HCL 100 MG PO CAPS
100.0000 mg | ORAL_CAPSULE | Freq: Every day | ORAL | Status: DC
  Administered 2020-03-21 – 2020-03-26 (×5): 100 mg via ORAL
  Filled 2020-03-21 (×7): qty 1

## 2020-03-21 MED ORDER — AMLODIPINE BESYLATE 5 MG PO TABS
5.0000 mg | ORAL_TABLET | Freq: Every day | ORAL | Status: DC
  Administered 2020-03-21 – 2020-03-25 (×4): 5 mg via ORAL
  Filled 2020-03-21 (×5): qty 1

## 2020-03-21 MED ORDER — POLYETHYLENE GLYCOL 3350 17 G PO PACK
17.0000 g | PACK | Freq: Every day | ORAL | Status: DC
  Administered 2020-03-25: 17 g via ORAL
  Filled 2020-03-21: qty 1

## 2020-03-21 MED ORDER — BUDESONIDE 0.5 MG/2ML IN SUSP
0.5000 mg | Freq: Two times a day (BID) | RESPIRATORY_TRACT | Status: DC
  Administered 2020-03-21 – 2020-04-07 (×34): 0.5 mg via RESPIRATORY_TRACT
  Filled 2020-03-21 (×34): qty 2

## 2020-03-21 MED ORDER — AMIODARONE LOAD VIA INFUSION
150.0000 mg | Freq: Once | INTRAVENOUS | Status: DC
  Filled 2020-03-21: qty 83.34

## 2020-03-21 MED ORDER — SODIUM CHLORIDE 0.9 % IV SOLN: INTRAVENOUS | Status: DC

## 2020-03-21 MED ORDER — LITHIUM CARBONATE 300 MG PO CAPS
300.0000 mg | ORAL_CAPSULE | Freq: Two times a day (BID) | ORAL | Status: DC
  Administered 2020-03-21 – 2020-03-27 (×9): 300 mg via ORAL
  Filled 2020-03-21 (×16): qty 1

## 2020-03-21 MED ORDER — AMIODARONE HCL IN DEXTROSE 360-4.14 MG/200ML-% IV SOLN: 60.0000 mg/h | INTRAVENOUS | Status: DC

## 2020-03-21 MED ORDER — HYDRALAZINE HCL 20 MG/ML IJ SOLN
10.0000 mg | Freq: Four times a day (QID) | INTRAMUSCULAR | Status: DC | PRN
  Administered 2020-03-23 – 2020-03-29 (×8): 10 mg via INTRAVENOUS
  Filled 2020-03-21 (×8): qty 1

## 2020-03-21 MED ORDER — METHYLPREDNISOLONE SODIUM SUCC 40 MG IJ SOLR
40.0000 mg | Freq: Two times a day (BID) | INTRAMUSCULAR | Status: DC
  Administered 2020-03-22 – 2020-03-23 (×3): 40 mg via INTRAVENOUS
  Filled 2020-03-21 (×3): qty 1

## 2020-03-21 MED ORDER — OXCARBAZEPINE 300 MG PO TABS
300.0000 mg | ORAL_TABLET | Freq: Every day | ORAL | Status: DC
  Administered 2020-03-21 – 2020-03-26 (×5): 300 mg via ORAL
  Filled 2020-03-21 (×7): qty 1

## 2020-03-21 MED ORDER — BISACODYL 5 MG PO TBEC
10.0000 mg | DELAYED_RELEASE_TABLET | Freq: Once | ORAL | Status: AC
  Administered 2020-03-21: 10 mg via ORAL
  Filled 2020-03-21: qty 2

## 2020-03-21 MED ORDER — AMIODARONE HCL IN DEXTROSE 360-4.14 MG/200ML-% IV SOLN
30.0000 mg/h | INTRAVENOUS | Status: DC
  Administered 2020-03-22 – 2020-03-31 (×15): 30 mg/h via INTRAVENOUS
  Filled 2020-03-21 (×21): qty 200

## 2020-03-21 MED ORDER — HEPARIN SODIUM (PORCINE) 5000 UNIT/ML IJ SOLN
5000.0000 [IU] | Freq: Three times a day (TID) | INTRAMUSCULAR | Status: DC
  Administered 2020-03-21 – 2020-04-07 (×53): 5000 [IU] via SUBCUTANEOUS
  Filled 2020-03-21 (×53): qty 1

## 2020-03-21 MED ORDER — CHLORHEXIDINE GLUCONATE CLOTH 2 % EX PADS
6.0000 | MEDICATED_PAD | Freq: Every day | CUTANEOUS | Status: DC
  Administered 2020-03-21 – 2020-04-01 (×12): 6 via TOPICAL

## 2020-03-21 MED ORDER — AMIODARONE HCL IN DEXTROSE 360-4.14 MG/200ML-% IV SOLN
30.0000 mg/h | INTRAVENOUS | Status: DC
  Filled 2020-03-21: qty 200

## 2020-03-21 MED ORDER — IPRATROPIUM-ALBUTEROL 0.5-2.5 (3) MG/3ML IN SOLN
3.0000 mL | RESPIRATORY_TRACT | Status: DC
  Administered 2020-03-21 – 2020-04-05 (×90): 3 mL via RESPIRATORY_TRACT
  Filled 2020-03-21 (×91): qty 3

## 2020-03-21 MED ORDER — METOPROLOL TARTRATE 5 MG/5ML IV SOLN
5.0000 mg | INTRAVENOUS | Status: AC
  Administered 2020-03-21: 5 mg via INTRAVENOUS

## 2020-03-21 MED ORDER — QUETIAPINE FUMARATE 25 MG PO TABS
50.0000 mg | ORAL_TABLET | Freq: Every day | ORAL | Status: DC
  Administered 2020-03-21 – 2020-03-26 (×5): 50 mg via ORAL
  Filled 2020-03-21 (×5): qty 2

## 2020-03-21 NOTE — Progress Notes (Addendum)
Shift summary:  - Report received from Mila Merry, RN.  - Amiodarone infusion held overnight as patient was in NSR upon transfer to CCU.  - Remains in NSR this AM.  - Hemodynamically stable.

## 2020-03-21 NOTE — Evaluation (Signed)
Occupational Therapy Evaluation Patient Details Name: Cynthia [name] MRN: [id number] DOB: Nov 18, 1955 Today's Date: 03/21/2020    History of Present Illness 64 year old female extensive mental health history including bipolar disorder, history of traumatic brain disorder, hypertension, coronary disease, peripheral vascular disease, COPD, chronic tobacco abuse, seizure disorder presents the ER today with several days of worsening shortness of breath and a cough.     Clinical Impression   Cynthia [name] was seen for OT evaluation this date. Pt unclear on PLOF and home setup, however reports MOD I mobility and ADL management c history of falls. Pt lives c husband however reports he is only home on Sundays ("He gets done what he needs to"). Pt presents to acute OT demonstrating impaired ADL performance, functional cognition, and functional mobility 2/2 decreased activity tolerance, functional strength/ROM/balance deficits, and poor safety awareness. Pt currently requires MOD VCs to don/doff gown at bed level - VCs for PLB and sequencing. CGA + BUE support + VCs encouragement for ADL t/f. Pt found sitting on wet pad - reports unable to tell when going or when wet. Pt visualized from hallway self-drinking c single UE, during session required 2 hands in cylindrical grasp for self-drinking - tremor noted, suspect anxiousness. Pt would benefit from skilled OT to address noted impairments and functional limitations (see below for any additional details) in order to maximize safety and independence while minimizing falls risk and caregiver burden. Upon hospital discharge, recommend HHOT to maximize pt safety and return to functional independence during meaningful occupations of daily life.     Follow Up Recommendations  Home health OT;Supervision/Assistance - 24 hour    Equipment Recommendations  3 in 1 bedside commode    Recommendations for Other Services       Precautions / Restrictions  Precautions Precautions: Fall Restrictions Weight Bearing Restrictions: No      Mobility Bed Mobility Overal bed mobility: Needs Assistance Bed Mobility: Supine to Sit;Sit to Supine     Supine to sit: Min assist Sit to supine: Min assist   General bed mobility comments: Assist for sequencing and adhering to task - MIN physical assist for LLE   Transfers Overall transfer level: Needs assistance Equipment used: 1 person hand held assist Transfers: Sit to/from Stand Sit to Stand: Min guard;From elevated surface         General transfer comment: CGA sit<>stand at EOB - B knee buckle at initial standing attempt - self corrected. Pt appeared anxious and held onto therapist despite not requiring physical assist to stand. Pt would benefit from RW use for stability     Balance Overall balance assessment: Needs assistance Sitting-balance support: Single extremity supported Sitting balance-Leahy Scale: Good     Standing balance support: Bilateral upper extremity supported Standing balance-Leahy Scale: Fair Standing balance comment: Pt reliant on BUE support for security                            ADL either performed or assessed with clinical judgement   ADL Overall ADL's : Needs assistance/impaired                                       General ADL Comments: Pt visualized from hallway self-drinking c single UE, during session required 2 hands in cylindrical grasp for self-drinking - tremor noted, suspect anxiousness. MOD VCs to don/doff gown at bed level - VCs  for PLB and sequencing. MIN A + VCs encouragement for ADL t/f. Pt found sitting on wet pad - reports unable to tell when going or when wet.      Vision         Perception     Praxis      Pertinent Vitals/Pain Pain Assessment: No/denies pain     Hand Dominance     Extremity/Trunk Assessment Upper Extremity Assessment Upper Extremity Assessment: RUE deficits/detail;LUE  deficits/detail RUE Deficits / Details: Grip 5/5, 5 finger opposition intact. Elbow extension AROM WFL. Shoulder flexion AROM limited ~90* LUE Deficits / Details: Grip 5/5. Elbow extension AROM WFL. Shoulder flexion AROM limited ~90*   Lower Extremity Assessment Lower Extremity Assessment: Generalized weakness (BLE buckling initial standing trial)       Communication Communication Communication: No difficulties   Cognition Arousal/Alertness: Awake/alert Behavior During Therapy: Anxious Overall Cognitive Status: Within Functional Limits for tasks assessed                                 General Comments: Pt appears anxious, apologies repeatedly for almost spilling drink on self and is shaking when previously seen drinking c no concerns    General Comments  Pt repeatedly tachycardic t/o mobility and upon return to rest - peaked 158 during standing trial. SpO2 remained stable on 2L [place] t/o session. VCs t.o for PLB    Exercises Exercises: Other exercises Other Exercises Other Exercises: Pt educated re: OT role, ECS, therapeutic use of self, importance of notifying NSG when wet to prevent skin breakdown, falls prevention Other Exercises: LBD, UBD, toileting, sup<>sit, sit<>stand, functional reach, sitting/standing balance/tolerance   Shoulder Instructions      Home Living Family/patient expects to be discharged to:: Private residence Living Arrangements: Spouse/significant other Available Help at Discharge: Available PRN/intermittently (husband gone M-Sat all day) Type of Home: House Home Access: Stairs to enter (pt reported different answers than PT eval ) Entrance Stairs-Number of Steps: 5 (Pt seemed unclear, guessed 4-5 steps to porch) Entrance Stairs-Rails: Can reach both Home Layout: One level     Bathroom Shower/Tub: Tub/shower unit         Home Equipment: Environmental consultant - 2 wheels;Cane - single point;Grab bars - tub/shower          Prior  Functioning/Environment Level of Independence: Needs assistance  Gait / Transfers Assistance Needed: Pt reports multiple falls in last 6 months however appears unclear (speaks vaguely about steps to porch and then says 9, unsure if reporting # of falls).  ADL's / Homemaking Assistance Needed: Pt reports being alone t/o day but unclear if assist needed for ADLs            OT Problem List: Decreased strength;Decreased range of motion;Decreased activity tolerance;Impaired balance (sitting and/or standing);Decreased safety awareness;Decreased knowledge of use of DME or AE;Cardiopulmonary status limiting activity      OT Treatment/Interventions: Self-care/ADL training;Therapeutic exercise;Energy conservation;DME and/or AE instruction;Therapeutic activities;Patient/family education;Balance training    OT Goals(Current goals can be found in the care plan section) Acute Rehab OT Goals Patient Stated Goal: go home OT Goal Formulation: With patient Time For Goal Achievement: 04/04/20 Potential to Achieve Goals: Good ADL Goals Pt Will Perform Grooming: sitting;with modified independence Pt Will Perform Lower Body Dressing: with min guard assist;sit to/from stand (c LRAD & VCs PRN\) Pt Will Transfer to Toilet: with supervision;ambulating;regular height toilet (c LRAD PRN)  OT Frequency: Min 2X/week   Barriers  to D/C: Inaccessible home environment;Decreased caregiver support          Co-evaluation              AM-PAC OT "6 Clicks" Daily Activity     Outcome Measure Help from another person eating meals?: A Little Help from another person taking care of personal grooming?: A Little Help from another person toileting, which includes using toliet, bedpan, or urinal?: A Little Help from another person bathing (including washing, rinsing, drying)?: A Lot Help from another person to put on and taking off regular upper body clothing?: A Little Help from another person to put on and taking off  regular lower body clothing?: A Little 6 Click Score: 17   End of Session Equipment Utilized During Treatment: Gait belt;Oxygen (2L [place])  Activity Tolerance: Patient tolerated treatment well Patient left: in bed;with call bell/phone within reach  OT Visit Diagnosis: Unsteadiness on feet (R26.81);Repeated falls (R29.6)                Time: 4967-5916 OT Time Calculation (min): 26 min Charges:  OT General Charges $OT Visit: 1 Visit OT Evaluation $OT Eval Moderate Complexity: 1 Mod OT Treatments $Self Care/Home Management : 8-22 mins  [name], M.S. OTR/L  03/21/20, 2:56 PM

## 2020-03-21 NOTE — Progress Notes (Addendum)
Pt received from the ED and ambulated to the bathroom, short of breath and wheezing, sats 93% on 3 liters, c/o abdominal pain, states she needed to urinate again, straining to have a BM as well. Heart rates 140-160's. Pt restless and disoriented to situation, rapid response called, team arrived and orders received. Jonny Ruiz NP present and Pt transferred to CCU.

## 2020-03-21 NOTE — Progress Notes (Signed)
Initial Nutrition Assessment  DOCUMENTATION CODES:   Not applicable  INTERVENTION:  Ensure Enlive po BID, each supplement provides 350 kcal and 20 grams of protein  NUTRITION DIAGNOSIS:   Increased nutrient needs related to acute illness, chronic illness (CAP; COPD) as evidenced by estimated needs.  GOAL:   Patient will meet greater than or equal to 90% of their needs   MONITOR:   Labs, Supplement acceptance, PO intake, Weight trends  REASON FOR ASSESSMENT:   Consult Assessment of nutrition requirement/status  ASSESSMENT:  RD working remotely.  64 year old female admitted with CAP after presenting with several day history of worsening SOB and cough. Past medical history of bipolar disorder, history of traumatic brain disorder, HTN, CAD, PVD, COPD, tobacco abuse, and seizure disorder.  Per notes, rapid response called overnight, patient SOB and wheezing, sats 93% on 3L, with complaints of abdominal pain heart rate 140-160's. Pt restless and disoriented, unable to obtain nutrition history at this time. No documented po intakes at this time for review, will provide Ensure to aid with meeting needs.   Per chart, weights have been stable over the past 11 months.  Medications reviewed and include:Symmetrel, Nexterone IV 150 mg once, Zithromax, Gabapentin, Lithium, Trileptal, Miralax, Senokot, Prednisone, Seroquel IVF: NaCl IVPB: Rocephin Labs: CBG 146, WBC 3.7 (L)  NUTRITION - FOCUSED PHYSICAL EXAM: Unable to complete at this time, RD working remotely.  Diet Order:   Diet Order            Diet regular Room service appropriate? Yes; Fluid consistency: Thin  Diet effective now                 EDUCATION NEEDS:   No education needs have been identified at this time  Skin:  Skin Assessment: Reviewed RN Assessment (scattered ecchymosis)  Last BM:  6/26 type 6  Height:   Ht Readings from Last 1 Encounters:  03/21/20 5\' 6"  (1.676 m)    Weight:   Wt Readings  from Last 1 Encounters:  03/21/20 65.3 kg    BMI:  Body mass index is 23.24 kg/m.  Estimated Nutritional Needs:   Kcal:  1900-2100  Protein:  95-105  Fluid:  >/= 1.9 L/day   Lajuan Lines, RD, LDN Clinical Nutrition After Hours/Weekend Pager # in [place]

## 2020-03-21 NOTE — Progress Notes (Signed)
    BRIEF OVERNIGHT PROGRESS REPORT   SUBJECTIVE: Patient c/o short of breath and wheezing, sats 93% on 3 liters, c/o abdominal pain, states she needed to urinate again, straining to have a BM as well. Heart rates 140-160's. Pt restless and disoriented to situation, rapid response called.  OBJECTIVE:On arrival to the bedside, she was afebrile with blood pressure 165/83 mm Hg and pulse rate 173 beats/min. There were no focal neurological deficits; she was alert and oriented x4, but very restless with distended abdomen.  BRIEF PATIENT DESCRIPTION: 64 y.o female admitted to medsurg unit with sepsis secondary to pneumonia and UTI. Now with acute hypoxic respiratory failure and Afib with RVR requiring transfer to stepdown unit.  ASSESSMENT/PLAN: Acute Hypoxic Respiratory Failure secondary to  Pneumonia+ COPD  with evidence of acute exacerbation - Transfer to stepdown unit - Supplemental O2 as needed to maintain O2 saturations 88 to 92% - Follow intermittent ABG and chest x-ray as needed - Repeat CXR  And KUB - As needed bronchodilators  Atrial Fibrillation with RVR - Transient? now back in NSR - EKG shows afib with RVR rate 153 bpm - Converted to NSR with IV Metoprolol x1.  - If back in Afib will consider Amiodarone next given hx of reactive airway dx. - Consider cardiology consult.   Sepsis secondary to pneumonia + UTI -Trend WBCs,Lactic and procalcitonin - Blood and urine cultures pending - Continue Empiric Abx with Ceftriaxone and Azithromycin - IVFs and PRN bolus to keep MAP<59mmHg or SBP <37mmHg  Acute Kidney Injury - Monitor I&O's / urinary output - Follow BMP - Ensure adequate renal perfusion - Avoid nephrotoxic agents as able - Replace electrolytes as indicated      Rufina Falco, DNP, CCRN, FNP-C [facility] Nurse Practitioner Between 7pm to [employer] - Pager [phone]

## 2020-03-21 NOTE — Evaluation (Signed)
Physical Therapy Evaluation Patient Details Name: Cynthia [name] MRN: [id number] DOB: [birth year] Today's Date: 03/21/2020   History of Present Illness  64 year old female extensive mental health history including bipolar disorder, history of traumatic brain disorder, hypertension, coronary disease, peripheral vascular disease, COPD, chronic tobacco abuse, seizure disorder presents the ER today with several days of worsening shortness of breath and a cough.    Clinical Impression  Pt's HR 130s on arrival, generally remained 110s and higher and elevated to nearly 160 with very modest ambulation near EOB.  She was impulsive and needed consistent cuing just to stay on task.  Overall she showed good effort and did not have any LOBs with standing effort but had labored breathing and generally showed poor tolerance to much activity at all.  Pt did appear to have the physical ability to do most in-home required mobility, etc but did show poor decision-making/awareness and apparently husband is out of the home most of the day 6 days/week.  Once medically more stable I expect her to be able to do most mobility, etc reasonably well however increased supervision in the home would be ideal as, again, she showed poor overall awareness and has had numerous falls recently.      Follow Up Recommendations Supervision - Intermittent (per medical status/progress plans should be home w/ HHPT)    Equipment Recommendations  None recommended by PT    Recommendations for Other Services       Precautions / Restrictions Precautions Precautions: Fall Restrictions Weight Bearing Restrictions: No      Mobility  Bed Mobility Overal bed mobility: Needs Assistance Bed Mobility: Supine to Sit;Sit to Supine     Supine to sit: Min assist Sit to supine: Min assist   General bed mobility comments: Pt needed consistent cuing and reinforcement to transition to/from supine/EOB but did not need excessive physical assist to  attain upright sitting. Able to do some scooting over in bed but not especially effective/efficient   Transfers Overall transfer level: Needs assistance Equipment used: Rolling walker (2 wheeled) Transfers: Sit to/from Stand Sit to Stand: Min guard         General transfer comment: Pt was able to rise with only CGA and verbal cues from relatively high bed height  Ambulation/Gait Ambulation/Gait assistance: Min guard Gait Distance (Feet): 7 Feet Assistive device: Rolling walker (2 wheeled)       General Gait Details: With walker pt appeared confident with ambulation, did not display any LOBs or over safety concerns regarding balance/buckling/etc.  However despite multiple cues prior to and during sit to stand to NOT step away from bed initially (PT hoping to monitor HR/BP/O2 on initially getting to standing) she did not have any hesitancy taking a few steps right away.  Reliant on the walker, clearly fatiguing, ackward getting back to bed to safely sit.  Stairs            Wheelchair Mobility    Modified Rankin (Stroke Patients Only)       Balance Overall balance assessment: Needs assistance Sitting-balance support: Single extremity supported Sitting balance-Leahy Scale: Good     Standing balance support: Bilateral upper extremity supported Standing balance-Leahy Scale: Fair Standing balance comment: reliant on walker, no LOBs/buckling with brief standing effort                             Pertinent Vitals/Pain Pain Assessment:  (chronic L LE pain, does not report acute pain)  Home Living Family/patient expects to be discharged to:: Private residence Living Arrangements: Spouse/significant other Available Help at Discharge: Available PRN/intermittently (husband gone M-Sat all day)   Home Access: Level entry (some question per previous notes, may have some steps?)       Home Equipment: Walker - 2 wheels;Cane - single point      Prior Function  Level of Independence: Needs assistance   Gait / Transfers Assistance Needed: PT reports using cane all the time, rarely but able to get out of the home, endorses "numerous" falls in last 6 months           Hand Dominance        Extremity/Trunk Assessment   Upper Extremity Assessment Upper Extremity Assessment: Generalized weakness    Lower Extremity Assessment Lower Extremity Assessment: Generalized weakness       Communication   Communication: No difficulties  Cognition Arousal/Alertness: Awake/alert Behavior During Therapy: WFL for tasks assessed/performed Overall Cognitive Status: Within Functional Limits for tasks assessed                                        General Comments General comments (skin integrity, edema, etc.): pt's HR 130s on arrival, BP 95/71.  Double checked with nursing who clears her for PT.  With activity HR generally 140s and nearly to 160s during ambulation.  Deferred prolonged upright/activity due to vitals and pt's inconsistency following instructions.    Exercises     Assessment/Plan    PT Assessment Patient needs continued PT services  PT Problem List Decreased strength;Decreased range of motion;Decreased activity tolerance;Decreased balance;Decreased mobility;Decreased coordination;Decreased cognition;Decreased safety awareness;Decreased knowledge of use of DME;Cardiopulmonary status limiting activity;Pain       PT Treatment Interventions DME instruction;Gait training;Stair training;Functional mobility training;Therapeutic activities;Therapeutic exercise;Balance training;Neuromuscular re-education;Cognitive remediation;Patient/family education    PT Goals (Current goals can be found in the Care Plan section)  Acute Rehab PT Goals Patient Stated Goal: go home PT Goal Formulation: With patient Time For Goal Achievement: 04/04/20 Potential to Achieve Goals: Fair    Frequency Min 2X/week   Barriers to discharge         Co-evaluation               AM-PAC PT "6 Clicks" Mobility  Outcome Measure Help needed turning from your back to your side while in a flat bed without using bedrails?: A Little Help needed moving from lying on your back to sitting on the side of a flat bed without using bedrails?: A Little Help needed moving to and from a bed to a chair (including a wheelchair)?: A Lot Help needed standing up from a chair using your arms (e.g., wheelchair or bedside chair)?: A Little Help needed to walk in hospital room?: A Lot Help needed climbing 3-5 steps with a railing? : A Lot 6 Click Score: 15    End of Session Equipment Utilized During Treatment: Gait belt;Oxygen Activity Tolerance: Patient limited by fatigue Patient left: in bed;with call bell/phone within reach;with nursing/sitter in room Nurse Communication: Mobility status (vitals) PT Visit Diagnosis: Muscle weakness (generalized) (M62.81);Unsteadiness on feet (R26.81);Difficulty in walking, not elsewhere classified (R26.2)    Time: 3151-7616 PT Time Calculation (min) (ACUTE ONLY): 23 min   Charges:   PT Evaluation $PT Eval Low Complexity: 1 Low PT Treatments $Therapeutic Activity: 8-22 mins        Kreg Shropshire, DPT 03/21/2020, 1:24  PM   

## 2020-03-21 NOTE — Progress Notes (Signed)
Chaplain responded to Rapid Response page. Patient in distress. Chaplain offered ministry of presence, prayer for calm and healing.
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
## 2020-03-21 NOTE — Plan of Care (Signed)

## 2020-03-21 NOTE — Progress Notes (Signed)
PROGRESS NOTE    Cynthia [name]  UVO:[id number] DOB: [birth year] DOA: 03/20/2020 PCP: Delsa Grana, PA-C    Brief Narrative:  64 year old female extensive mental health history including bipolar disorder, history of traumatic brain disorder, hypertension, coronary disease, peripheral vascular disease, COPD, chronic tobacco abuse, seizure disorder presents the ER today with several days of worsening shortness of breath and a cough.  Patient started having abdominal pain today.  No fevers.  Patient states that she has felt chilled at home.  Husband came home from work.  Patient has been having abdominal pain.  CT of her abdomen pelvis demonstrated a dense left lower lobe pneumonia    Consultants:   pccm  Procedures:  Antimicrobials:   Ceftriaxone and azithromycin   Subjective: Patient reports still short of breath and winded when she talks but feeling a little better.  No coughing or chest pain.  No other complaints  Objective: Vitals:   03/21/20 1500 03/21/20 1513 03/21/20 1600 03/21/20 1700  BP: (!) 152/46  (!) 149/55 (!) 161/122  Pulse: 86 87 84 87  Resp: (!) 27 (!) 26 (!) 23 (!) 28  Temp:   99.6 F (37.6 C)   TempSrc:   Axillary   SpO2: 91% 92% 91% 92%  Weight:      Height:        Intake/Output Summary (Last 24 hours) at 03/21/2020 1710 Last data filed at 03/21/2020 1700 Gross per 24 hour  Intake 1965.41 ml  Output 400 ml  Net 1565.41 ml   Filed Weights   03/20/20 1920 03/21/20 0211  Weight: 63.5 kg 65.3 kg    Examination:  General exam: Appears calm and comfortable  Respiratory system: Clear to auscultation. Respiratory effort normal. Cardiovascular system: S1 & S2 heard, RRR. No JVD, murmurs, rubs, gallops or clicks. No pedal edema. Gastrointestinal system: Abdomen is nondistended, soft and nontender. No organomegaly or masses felt. Normal bowel sounds heard. Central nervous system: Alert and oriented. No focal neurological deficits. Extremities: Symmetric  5 x 5 power. Skin: No rashes, lesions or ulcers Psychiatry: Judgement and insight appear normal. Mood & affect appropriate.     Data Reviewed: I have personally reviewed following labs and imaging studies  CBC: Recent Labs  Lab 03/20/20 1955 03/21/20 0240 03/21/20 0638  WBC 6.8 5.2 3.7*  NEUTROABS  --  3.5  --   HGB 13.9 13.4 12.6  HCT 42.6 42.0 39.0  MCV 93.6 94.2 93.8  PLT 126* 113* 742*   Basic Metabolic Panel: Recent Labs  Lab 03/20/20 1955 03/21/20 0240 03/21/20 0416  NA 131* 134* 136  K 3.8 4.2 4.2  CL 99 105 108  CO2 20* 19* 20*  GLUCOSE 127* 160* 171*  BUN 48* 47* 46*  CREATININE 3.10* 2.55* 2.41*  CALCIUM 9.5 9.2 9.0   GFR: Estimated Creatinine Clearance: 21.8 mL/min (A) (by C-G formula based on SCr of 2.41 mg/dL (H)). Liver Function Tests: Recent Labs  Lab 03/20/20 1955 03/21/20 0240 03/21/20 0416  AST 15 13* 12*  ALT 8 7 7   ALKPHOS 45 41 34*  BILITOT 0.9 0.9 0.9  PROT 7.4 7.1 6.4*  ALBUMIN 3.4* 3.1* 2.9*   Recent Labs  Lab 03/20/20 1955  LIPASE 16   Recent Labs  Lab 03/21/20 0241  AMMONIA 25   Coagulation Profile: No results for input(s): INR, PROTIME in the last 168 hours. Cardiac Enzymes: No results for input(s): CKTOTAL, CKMB, CKMBINDEX, TROPONINI in the last 168 hours. BNP (last 3 results) No results for input(s):  PROBNP in the last 8760 hours. HbA1C: No results for input(s): HGBA1C in the last 72 hours. CBG: Recent Labs  Lab 03/21/20 0210  GLUCAP 146*   Lipid Profile: No results for input(s): CHOL, HDL, LDLCALC, TRIG, CHOLHDL, LDLDIRECT in the last 72 hours. Thyroid Function Tests: No results for input(s): TSH, T4TOTAL, FREET4, T3FREE, THYROIDAB in the last 72 hours. Anemia Panel: No results for input(s): VITAMINB12, FOLATE, FERRITIN, TIBC, IRON, RETICCTPCT in the last 72 hours. Sepsis Labs: Recent Labs  Lab 03/20/20 1955 03/20/20 2211 03/21/20 0008 03/21/20 0241 03/21/20 0416  PROCALCITON 4.16  --   --   --   --    LATICACIDVEN  --  3.7* 2.3* 3.1* 2.2*    Recent Results (from the past 240 hour(s))  SARS Coronavirus 2 by RT PCR (hospital order, performed in [facility] hospital lab) Nasopharyngeal Nasopharyngeal Swab     Status: None   Collection Time: 03/20/20  8:19 PM   Specimen: Nasopharyngeal Swab  Result Value Ref Range Status   SARS Coronavirus 2 NEGATIVE NEGATIVE Final    Comment: (NOTE) SARS-CoV-2 target nucleic acids are NOT DETECTED.  The SARS-CoV-2 RNA is generally detectable in upper and lower respiratory specimens during the acute phase of infection. The lowest concentration of SARS-CoV-2 viral copies this assay can detect is 250 copies / mL. A negative result does not preclude SARS-CoV-2 infection and should not be used as the sole basis for treatment or other patient management decisions.  A negative result may occur with improper specimen collection / handling, submission of specimen other than nasopharyngeal swab, presence of viral mutation(s) within the areas targeted by this assay, and inadequate number of viral copies (<250 copies / mL). A negative result must be combined with clinical observations, patient history, and epidemiological information.  Fact Sheet for Patients:   StrictlyIdeas.no  Fact Sheet for Healthcare Providers: BankingDealers.co.za  This test is not yet approved or  cleared by the [place] FDA and has been authorized for detection and/or diagnosis of SARS-CoV-2 by FDA under an Emergency Use Authorization (EUA).  This EUA will remain in effect (meaning this test can be used) for the duration of the COVID-19 declaration under Section 564(b)(1) of the Act, 21 U.S.C. section 360bbb-3(b)(1), unless the authorization is terminated or revoked sooner.  Performed at [facility], [address] [place], [place] [address]   Blood Culture (routine x 2)     Status: None (Preliminary result)    Collection Time: 03/20/20  9:17 PM   Specimen: BLOOD  Result Value Ref Range Status   Specimen Description BLOOD LEFT FOREARM  Final   Special Requests   Final    BOTTLES DRAWN AEROBIC AND ANAEROBIC Blood Culture results may not be optimal due to an inadequate volume of blood received in culture bottles   Culture   Final    NO GROWTH < 12 HOURS Performed at [facility], [address], [place],  [address]    Report Status PENDING  Incomplete  Blood Culture (routine x 2)     Status: None (Preliminary result)   Collection Time: 03/20/20 10:11 PM   Specimen: BLOOD  Result Value Ref Range Status   Specimen Description BLOOD RIGHT FOREARM  Final   Special Requests   Final    BOTTLES DRAWN AEROBIC AND ANAEROBIC Blood Culture results may not be optimal due to an inadequate volume of blood received in culture bottles   Culture   Final    NO GROWTH <  12 HOURS Performed at [facility], [place], [place] [address]    Report Status PENDING  Incomplete  MRSA PCR Screening     Status: None   Collection Time: 03/21/20  2:07 AM   Specimen: Nasal Mucosa; Nasopharyngeal  Result Value Ref Range Status   MRSA by PCR NEGATIVE NEGATIVE Final    Comment:        The GeneXpert MRSA Assay (FDA approved for NASAL specimens only), is one component of a comprehensive MRSA colonization surveillance program. It is not intended to diagnose MRSA infection nor to guide or monitor treatment for MRSA infections. Performed at [facility], [address] [place], [place] [address]          Radiology Studies: CT ABDOMEN PELVIS WO CONTRAST  Result Date: 03/20/2020 CLINICAL DATA:  Periumbilical abdominal pain. EXAM: CT ABDOMEN AND PELVIS WITHOUT CONTRAST TECHNIQUE: Multidetector CT imaging of the abdomen and pelvis was performed following the standard protocol without IV contrast. COMPARISON:  None. FINDINGS: Lower chest: Marked severity infiltrates are  seen within the posterior aspect of the bilateral lower lobes, left greater than right. Chronic posterior right ninth and tenth rib fractures are seen. Hepatobiliary: No focal liver abnormality is seen. The gallbladder is markedly distended without evidence of gallstones, gallbladder wall thickening, or biliary dilatation. Pancreas: Unremarkable. No pancreatic ductal dilatation or surrounding inflammatory changes. Spleen: Normal in size without focal abnormality. Adrenals/Urinary Tract: 3.1 cm x 1.5 cm and 1.5 cm x 1.2 cm low-attenuation left adrenal masses are seen. Kidneys are normal, without renal calculi, focal lesion, or hydronephrosis. Bladder is unremarkable. Stomach/Bowel: Stomach is within normal limits. Appendix appears normal. No evidence of bowel wall thickening, distention, or inflammatory changes. Vascular/Lymphatic: There is marked severity calcification of the abdominal aorta. Bilateral common iliac artery and bilateral external iliac artery stents are seen. No enlarged abdominal or pelvic lymph nodes. Reproductive: Uterus and bilateral adnexa are unremarkable. Other: No abdominal wall hernia or abnormality. No abdominopelvic ascites. Musculoskeletal: Marked severity degenerative changes are seen at the level of L5-S1. IMPRESSION: 1. Marked severity bilateral lower lobe infiltrates, left greater than right. 2. Low-attenuation left adrenal masses which may represent adrenal adenomas. 3. Marked severity calcification of the abdominal aorta. 4. Bilateral common iliac artery and bilateral external iliac artery stents. 5. Marked severity degenerative changes at the level of L5-S1. Aortic Atherosclerosis (ICD10-I70.0). Electronically Signed   By: Virgina Norfolk M.D.   On: 03/20/2020 20:52   DG Chest 1 View  Result Date: 03/21/2020 CLINICAL DATA:  Abdominal distension EXAM: CHEST  1 VIEW COMPARISON:  Radiograph 03/20/2020, CT abdomen pelvis 03/20/2020, CT chest 11/25/2019 FINDINGS: Dual lead pacer  pack overlies left chest wall with leads in stable position at the right atrium and cardiac apex. Telemetry leads and nasal cannula overlie the chest. Persistent bibasilar consolidative opacities. Suspect a trace left effusion. Some fissural and septal thickening is noted with increasing central venous congestion and central cuffing. Could reflect superimposed edema. Remote posttraumatic deformity of the distal right clavicle. No acute osseous or soft tissue abnormality. The aorta is calcified. The remaining cardiomediastinal contours are unremarkable. IMPRESSION: 1. Persistent bibasilar consolidative opacities. 2. Increasing central venous congestion, cuffing and septal thickening. Likely developing interstitial edema. Electronically Signed   By: Lovena Le M.D.   On: 03/21/2020 02:15   DG Chest 2 View  Result Date: 03/20/2020 CLINICAL DATA:  Shortness of breath. EXAM: CHEST - 2 VIEW COMPARISON:  July 11, 2017 FINDINGS: There is a dual  lead AICD. Mild to moderate severity areas of atelectasis and/or infiltrate are seen within the bilateral lung bases. There is a small left pleural effusion. No pneumothorax is identified. The heart size and mediastinal contours are within normal limits. The visualized skeletal structures are unremarkable. IMPRESSION: 1. Mild to moderate severity bibasilar atelectasis and/or infiltrate. 2. Small left pleural effusion. Electronically Signed   By: Virgina Norfolk M.D.   On: 03/20/2020 20:53   DG Abd 1 View  Result Date: 03/21/2020 CLINICAL DATA:  Abdominal distension EXAM: ABDOMEN - 1 VIEW COMPARISON:  CT abdomen pelvis 03/20/2020 FINDINGS: Persistent consolidative opacities in the lung bases. Trace left effusion. Cardiomediastinal contours are stable from prior. No high-grade obstructive bowel gas pattern is seen in the abdomen or pelvis. Moderate colonic stool burden. Air and stool projects over the rectal vault. Multiple vascular calcifications noted throughout the  upper abdomen including over the renal pelves compatible with findings on CT. No suspicious calcifications. Vascular stenting of the iliac arteries. Multilevel degenerative changes in the spine, hips and pelvis. External rotation of the right hip limits evaluation of the femoral neck. If there is concern for femoral abnormality, recommend dedicated imaging. IMPRESSION: 1. No high-grade obstructive bowel gas pattern in the abdomen or pelvis. 2. Moderate colonic stool burden. 3. Persistent consolidative opacities in the lung bases. Trace left effusion. Electronically Signed   By: Lovena Le M.D.   On: 03/21/2020 02:12        Scheduled Meds: . amantadine  100 mg Oral Daily  . azithromycin  500 mg Oral Daily  . budesonide (PULMICORT) nebulizer solution  0.5 mg Nebulization BID  . Chlorhexidine Gluconate Cloth  6 each Topical Daily  . clopidogrel  75 mg Oral Daily  . feeding supplement (ENSURE ENLIVE)  237 mL Oral BID BM  . gabapentin  300 mg Oral Daily  . heparin  5,000 Units Subcutaneous Q8H  . ipratropium-albuterol  3 mL Nebulization Q4H  . lithium carbonate  300 mg Oral BID WC  . [START ON 03/22/2020] methylPREDNISolone (SOLU-MEDROL) injection  40 mg Intravenous Q12H  . Oxcarbazepine  300 mg Oral QHS  . polyethylene glycol  17 g Oral Daily  . QUEtiapine  100 mg Oral QHS  . QUEtiapine  50 mg Oral QPC breakfast  . senna  1 tablet Oral QHS   Continuous Infusions: . amiodarone 60 mg/hr (03/21/20 1700)   Followed by  . amiodarone    . cefTRIAXone (ROCEPHIN)  IV Stopped (03/21/20 1101)    Assessment & Plan:   Principal Problem:   CAP (community acquired pneumonia) Active Problems:   Essential hypertension, benign   Traumatic brain injury ([place])   Seizures ([place])   Bipolar affective disorder ([place])   Artificial cardiac pacemaker   COPD with acute exacerbation ([place])   AKI (acute kidney injury) ([place])   Acute respiratory failure with hypoxia ([place])   CAP (community acquired  pneumonia) Still quite symptomatic and become short of breath with conversation Continue. IV rocephin, PO zithromax.   Blood cx pending  Incentive spirometry/pulmonary toileting. Keep O2 sat above 90% Lactate decreasing  Acute respiratory failure with hypoxia (HCC) Continue with supplemental O2. Husband(keith) states he cannot afford pt's co-pay for supplemental O2. Pt has needed supplemental O2 for at least 6 months according to husband. Duo nebs, see below   Essential hypertension, benign Resume home amlodipine at 5mg  increase as tolerated.   COPD with acute exacerbation ([place]) Continue with duonebs q6h.  Change po to iv steroid 40mg   Pulmonary consulted as pt requiring more 02-input was appreciated. Incentive spirometer  AKI (acute kidney injury) ([place]) Hold her ARB. Continue with IVF. Husband had stated he given her ibuprofen every day due to pt having a headache. Hold NSAIDs for now.  Creatinine is 2.41 now. Will monitor, if not improving, will obtain renal US in am Avoid for toxic agents   Traumatic brain injury ([place]) Chronic.  Seizures ([place]) Continue her home seizure meds.  Bipolar affective disorder (HCC) Chronic.on lithium  Artificial cardiac pacemaker Stable.   DVT prophylaxis: Heparin subcu Code Status: Full Family Communication: None at bedside Disposition Plan: Back home when stable Barrier: Patient quite short of breath and requiring IV steroids and IV antibiotics not medically stable for discharge Anticipated discharge date: To be determined once medically stable as above       LOS: 1 day   Time spent: 45 minutes with more than 50% on [place], MD [facility] Pager [phone]  If 7PM-7AM, please contact night-coverage [email or web address] Password [email or web address] 03/21/2020, 5:10 PM

## 2020-03-22 ENCOUNTER — Encounter: Payer: Self-pay | Admitting: Internal Medicine

## 2020-03-22 ENCOUNTER — Inpatient Hospital Stay: Payer: Medicare Other

## 2020-03-22 DIAGNOSIS — J9601 Acute respiratory failure with hypoxia: Secondary | ICD-10-CM

## 2020-03-22 DIAGNOSIS — J441 Chronic obstructive pulmonary disease with (acute) exacerbation: Secondary | ICD-10-CM | POA: Diagnosis not present

## 2020-03-22 DIAGNOSIS — J189 Pneumonia, unspecified organism: Secondary | ICD-10-CM | POA: Diagnosis not present

## 2020-03-22 DIAGNOSIS — N179 Acute kidney failure, unspecified: Secondary | ICD-10-CM | POA: Diagnosis not present

## 2020-03-22 DIAGNOSIS — Z950 Presence of cardiac pacemaker: Secondary | ICD-10-CM | POA: Diagnosis not present

## 2020-03-22 LAB — BASIC METABOLIC PANEL
Anion gap: 8 (ref 5–15)
BUN: 41 mg/dL — ABNORMAL HIGH (ref 8–23)
CO2: 21 mmol/L — ABNORMAL LOW (ref 22–32)
Calcium: 10.1 mg/dL (ref 8.9–10.3)
Chloride: 111 mmol/L (ref 98–111)
Creatinine, Ser: 1.54 mg/dL — ABNORMAL HIGH (ref 0.44–1.00)
GFR calc Af Amer: 41 mL/min — ABNORMAL LOW (ref >60)
GFR calc non Af Amer: 35 mL/min — ABNORMAL LOW (ref >60)
Glucose, Bld: 170 mg/dL — ABNORMAL HIGH (ref 70–99)
Potassium: 3.2 mmol/L — ABNORMAL LOW (ref 3.5–5.1)
Sodium: 140 mmol/L (ref 135–145)

## 2020-03-22 LAB — CBC
HCT: 37 % (ref 36.0–46.0)
Hemoglobin: 12.1 g/dL (ref 12.0–15.0)
MCH: 30 pg (ref 26.0–34.0)
MCHC: 32.7 g/dL (ref 30.0–36.0)
MCV: 91.6 fL (ref 80.0–100.0)
Platelets: 113 10*3/uL — ABNORMAL LOW (ref 150–400)
RBC: 4.04 MIL/uL (ref 3.87–5.11)
RDW: 14.3 % (ref 11.5–15.5)
WBC: 3.6 10*3/uL — ABNORMAL LOW (ref 4.0–10.5)
nRBC: 0 % (ref 0.0–0.2)

## 2020-03-22 LAB — LACTIC ACID, PLASMA
Lactic Acid, Venous: 1.9 mmol/L (ref 0.5–1.9)
Lactic Acid, Venous: 1.6 mmol/L (ref 0.5–1.9)

## 2020-03-22 MED ORDER — POTASSIUM CHLORIDE 10 MEQ/100ML IV SOLN
10.0000 meq | INTRAVENOUS | Status: AC
  Administered 2020-03-22 (×3): 10 meq via INTRAVENOUS
  Filled 2020-03-22 (×3): qty 100

## 2020-03-22 MED ORDER — AMIODARONE IV BOLUS ONLY 150 MG/100ML
150.0000 mg | Freq: Once | INTRAVENOUS | Status: AC
  Administered 2020-03-22: 150 mg via INTRAVENOUS

## 2020-03-22 NOTE — Progress Notes (Addendum)
Shift summary:  - Report received from [facility], RN. - Patient remained on BiPAP all day, minus 2 hours this afternoon. - Unable to tolerate [place] O2 only: patient became hypoxic and flipped into RVR while on [place] only. - Remains on IV Amio gtt, received 150 mg bolus per Dr. Ubaldo Glassing.

## 2020-03-22 NOTE — Plan of Care (Signed)
A&O patient admitted with community acquired pneumonia transferred to ICU on 03/21/20 with Afib RVR. Amiodarone infusing, requiring increased oxygen after arrival to ICU and currently on Bipap FIO2 at 45%

## 2020-03-22 NOTE — Progress Notes (Addendum)
PROGRESS NOTE    Cynthia [name]  IRC:[id number] DOB: Oct 13, 1955 DOA: 03/20/2020 PCP: Delsa Grana, PA-C    Brief Narrative:  64 year old female extensive mental health history including bipolar disorder, history of traumatic brain disorder, hypertension, coronary disease, peripheral vascular disease, COPD, chronic tobacco abuse, seizure disorder presents the ER today with several days of worsening shortness of breath and a cough.  Patient started having abdominal pain today.  No fevers.  Patient states that she has felt chilled at home.  Husband came home from work.  Patient has been having abdominal pain.  CT of her abdomen pelvis demonstrated a dense left lower lobe pneumonia    Consultants:   pccm  Procedures:  Antimicrobials:   Ceftriaxone and azithromycin   Subjective: Husband at bedside.  Patient on BiPAP.  Reports feeling still short of breath.  No other complaints.  On telemetry remains in sinus rhythm.  Objective: Vitals:   03/22/20 1100 03/22/20 1116 03/22/20 1200 03/22/20 1300  BP: (!) 147/59  131/73 (!) 146/71  Pulse: 72 72 68 80  Resp: (!) 26 (!) 22 19 (!) 21  Temp:  97.8 F (36.6 C)    TempSrc:  Axillary    SpO2: 94% 95% 95% 90%  Weight:      Height:        Intake/Output Summary (Last 24 hours) at 03/22/2020 1346 Last data filed at 03/22/2020 1300 Gross per 24 hour  Intake 1180.25 ml  Output 1150 ml  Net 30.25 ml   Filed Weights   03/20/20 1920 03/21/20 0211 03/22/20 0600  Weight: 63.5 kg 65.3 kg 64.2 kg    Examination:  General exam: Becomes short of breath when, stating, on BiPAP, husband at bedside Respiratory system: Increased expiratory time, clear to auscultation, minimal wheezing Cardiovascular system: S1 & S2 heard, RRR. No JVD, murmurs, rubs, gallops or clicks. Gastrointestinal system: Abdomen is nondistended, soft and nontender.Normal bowel sounds heard. Central nervous system: Alert and oriented.  Grossly intact Extremities: No  edema Skin: Warm and dry Psychiatry: Judgement and insight appear normal. Mood & affect appropriate.     Data Reviewed: I have personally reviewed following labs and imaging studies  CBC: Recent Labs  Lab 03/20/20 1955 03/21/20 0240 03/21/20 0638 03/22/20 0304  WBC 6.8 5.2 3.7* 3.6*  NEUTROABS  --  3.5  --   --   HGB 13.9 13.4 12.6 12.1  HCT 42.6 42.0 39.0 37.0  MCV 93.6 94.2 93.8 91.6  PLT 126* 113* 104* 510*   Basic Metabolic Panel: Recent Labs  Lab 03/20/20 1955 03/21/20 0240 03/21/20 0416 03/22/20 0304  NA 131* 134* 136 140  K 3.8 4.2 4.2 3.2*  CL 99 105 108 111  CO2 20* 19* 20* 21*  GLUCOSE 127* 160* 171* 170*  BUN 48* 47* 46* 41*  CREATININE 3.10* 2.55* 2.41* 1.54*  CALCIUM 9.5 9.2 9.0 10.1   GFR: Estimated Creatinine Clearance: 34.1 mL/min (A) (by C-G formula based on SCr of 1.54 mg/dL (H)). Liver Function Tests: Recent Labs  Lab 03/20/20 1955 03/21/20 0240 03/21/20 0416  AST 15 13* 12*  ALT 8 7 7   ALKPHOS 45 41 34*  BILITOT 0.9 0.9 0.9  PROT 7.4 7.1 6.4*  ALBUMIN 3.4* 3.1* 2.9*   Recent Labs  Lab 03/20/20 1955  LIPASE 16   Recent Labs  Lab 03/21/20 0241  AMMONIA 25   Coagulation Profile: No results for input(s): INR, PROTIME in the last 168 hours. Cardiac Enzymes: No results for input(s): CKTOTAL, CKMB, CKMBINDEX,  TROPONINI in the last 168 hours. BNP (last 3 results) No results for input(s): PROBNP in the last 8760 hours. HbA1C: No results for input(s): HGBA1C in the last 72 hours. CBG: Recent Labs  Lab 03/21/20 0210  GLUCAP 146*   Lipid Profile: No results for input(s): CHOL, HDL, LDLCALC, TRIG, CHOLHDL, LDLDIRECT in the last 72 hours. Thyroid Function Tests: No results for input(s): TSH, T4TOTAL, FREET4, T3FREE, THYROIDAB in the last 72 hours. Anemia Panel: No results for input(s): VITAMINB12, FOLATE, FERRITIN, TIBC, IRON, RETICCTPCT in the last 72 hours. Sepsis Labs: Recent Labs  Lab 03/20/20 1955 03/20/20 2211  03/21/20 0008 03/21/20 0241 03/21/20 0416  PROCALCITON 4.16  --   --   --   --   LATICACIDVEN  --  3.7* 2.3* 3.1* 2.2*    Recent Results (from the past 240 hour(s))  SARS Coronavirus 2 by RT PCR (hospital order, performed in [facility] hospital lab) Nasopharyngeal Nasopharyngeal Swab     Status: None   Collection Time: 03/20/20  8:19 PM   Specimen: Nasopharyngeal Swab  Result Value Ref Range Status   SARS Coronavirus 2 NEGATIVE NEGATIVE Final    Comment: (NOTE) SARS-CoV-2 target nucleic acids are NOT DETECTED.  The SARS-CoV-2 RNA is generally detectable in upper and lower respiratory specimens during the acute phase of infection. The lowest concentration of SARS-CoV-2 viral copies this assay can detect is 250 copies / mL. A negative result does not preclude SARS-CoV-2 infection and should not be used as the sole basis for treatment or other patient management decisions.  A negative result may occur with improper specimen collection / handling, submission of specimen other than nasopharyngeal swab, presence of viral mutation(s) within the areas targeted by this assay, and inadequate number of viral copies (<250 copies / mL). A negative result must be combined with clinical observations, patient history, and epidemiological information.  Fact Sheet for Patients:   StrictlyIdeas.no  Fact Sheet for Healthcare Providers: BankingDealers.co.za  This test is not yet approved or  cleared by the [place] FDA and has been authorized for detection and/or diagnosis of SARS-CoV-2 by FDA under an Emergency Use Authorization (EUA).  This EUA will remain in effect (meaning this test can be used) for the duration of the COVID-19 declaration under Section 564(b)(1) of the Act, 21 U.S.C. section 360bbb-3(b)(1), unless the authorization is terminated or revoked sooner.  Performed at [facility], [address] [place], [place]  [id number]   Blood Culture (routine x 2)     Status: None (Preliminary result)   Collection Time: 03/20/20  9:17 PM   Specimen: BLOOD  Result Value Ref Range Status   Specimen Description BLOOD LEFT FOREARM  Final   Special Requests   Final    BOTTLES DRAWN AEROBIC AND ANAEROBIC Blood Culture results may not be optimal due to an inadequate volume of blood received in culture bottles   Culture   Final    NO GROWTH 2 DAYS Performed at [facility], [address], [place], [place] [address]    Report Status PENDING  Incomplete  Blood Culture (routine x 2)     Status: None (Preliminary result)   Collection Time: 03/20/20 10:11 PM   Specimen: BLOOD  Result Value Ref Range Status   Specimen Description BLOOD RIGHT FOREARM  Final   Special Requests   Final    BOTTLES DRAWN AEROBIC AND ANAEROBIC Blood Culture results may not be optimal due to an inadequate volume of blood received in  culture bottles   Culture   Final    NO GROWTH 2 DAYS Performed at [facility], [address] [place], [place] [address]    Report Status PENDING  Incomplete  MRSA PCR Screening     Status: None   Collection Time: 03/21/20  2:07 AM   Specimen: Nasal Mucosa; Nasopharyngeal  Result Value Ref Range Status   MRSA by PCR NEGATIVE NEGATIVE Final    Comment:        The GeneXpert MRSA Assay (FDA approved for NASAL specimens only), is one component of a comprehensive MRSA colonization surveillance program. It is not intended to diagnose MRSA infection nor to guide or monitor treatment for MRSA infections. Performed at [facility], [address] [place], [place] [address]          Radiology Studies: CT ABDOMEN PELVIS WO CONTRAST  Result Date: 03/20/2020 CLINICAL DATA:  Periumbilical abdominal pain. EXAM: CT ABDOMEN AND PELVIS WITHOUT CONTRAST TECHNIQUE: Multidetector CT imaging of the abdomen and pelvis was performed following the standard protocol without IV contrast.  COMPARISON:  None. FINDINGS: Lower chest: Marked severity infiltrates are seen within the posterior aspect of the bilateral lower lobes, left greater than right. Chronic posterior right ninth and tenth rib fractures are seen. Hepatobiliary: No focal liver abnormality is seen. The gallbladder is markedly distended without evidence of gallstones, gallbladder wall thickening, or biliary dilatation. Pancreas: Unremarkable. No pancreatic ductal dilatation or surrounding inflammatory changes. Spleen: Normal in size without focal abnormality. Adrenals/Urinary Tract: 3.1 cm x 1.5 cm and 1.5 cm x 1.2 cm low-attenuation left adrenal masses are seen. Kidneys are normal, without renal calculi, focal lesion, or hydronephrosis. Bladder is unremarkable. Stomach/Bowel: Stomach is within normal limits. Appendix appears normal. No evidence of bowel wall thickening, distention, or inflammatory changes. Vascular/Lymphatic: There is marked severity calcification of the abdominal aorta. Bilateral common iliac artery and bilateral external iliac artery stents are seen. No enlarged abdominal or pelvic lymph nodes. Reproductive: Uterus and bilateral adnexa are unremarkable. Other: No abdominal wall hernia or abnormality. No abdominopelvic ascites. Musculoskeletal: Marked severity degenerative changes are seen at the level of L5-S1. IMPRESSION: 1. Marked severity bilateral lower lobe infiltrates, left greater than right. 2. Low-attenuation left adrenal masses which may represent adrenal adenomas. 3. Marked severity calcification of the abdominal aorta. 4. Bilateral common iliac artery and bilateral external iliac artery stents. 5. Marked severity degenerative changes at the level of L5-S1. Aortic Atherosclerosis (ICD10-I70.0). Electronically Signed   By: Virgina Norfolk M.D.   On: 03/20/2020 20:52   DG Chest 1 View  Result Date: 03/21/2020 CLINICAL DATA:  Abdominal distension EXAM: CHEST  1 VIEW COMPARISON:  Radiograph 03/20/2020, CT  abdomen pelvis 03/20/2020, CT chest 11/25/2019 FINDINGS: Dual lead pacer pack overlies left chest wall with leads in stable position at the right atrium and cardiac apex. Telemetry leads and nasal cannula overlie the chest. Persistent bibasilar consolidative opacities. Suspect a trace left effusion. Some fissural and septal thickening is noted with increasing central venous congestion and central cuffing. Could reflect superimposed edema. Remote posttraumatic deformity of the distal right clavicle. No acute osseous or soft tissue abnormality. The aorta is calcified. The remaining cardiomediastinal contours are unremarkable. IMPRESSION: 1. Persistent bibasilar consolidative opacities. 2. Increasing central venous congestion, cuffing and septal thickening. Likely developing interstitial edema. Electronically Signed   By: Lovena Le M.D.   On: 03/21/2020 02:15   DG Chest 2 View  Result Date: 03/20/2020 CLINICAL DATA:  Shortness of breath. EXAM: CHEST -  2 VIEW COMPARISON:  July 11, 2017 FINDINGS: There is a dual lead AICD. Mild to moderate severity areas of atelectasis and/or infiltrate are seen within the bilateral lung bases. There is a small left pleural effusion. No pneumothorax is identified. The heart size and mediastinal contours are within normal limits. The visualized skeletal structures are unremarkable. IMPRESSION: 1. Mild to moderate severity bibasilar atelectasis and/or infiltrate. 2. Small left pleural effusion. Electronically Signed   By: Virgina Norfolk M.D.   On: 03/20/2020 20:53   DG Abd 1 View  Result Date: 03/21/2020 CLINICAL DATA:  Abdominal distension EXAM: ABDOMEN - 1 VIEW COMPARISON:  CT abdomen pelvis 03/20/2020 FINDINGS: Persistent consolidative opacities in the lung bases. Trace left effusion. Cardiomediastinal contours are stable from prior. No high-grade obstructive bowel gas pattern is seen in the abdomen or pelvis. Moderate colonic stool burden. Air and stool projects over  the rectal vault. Multiple vascular calcifications noted throughout the upper abdomen including over the renal pelves compatible with findings on CT. No suspicious calcifications. Vascular stenting of the iliac arteries. Multilevel degenerative changes in the spine, hips and pelvis. External rotation of the right hip limits evaluation of the femoral neck. If there is concern for femoral abnormality, recommend dedicated imaging. IMPRESSION: 1. No high-grade obstructive bowel gas pattern in the abdomen or pelvis. 2. Moderate colonic stool burden. 3. Persistent consolidative opacities in the lung bases. Trace left effusion. Electronically Signed   By: Lovena Le M.D.   On: 03/21/2020 02:12   DG Chest Port 1 View  Result Date: 03/22/2020 CLINICAL DATA:  Acute respiratory failure EXAM: PORTABLE CHEST 1 VIEW COMPARISON:  March 21, 2020 FINDINGS: Stable pacemaker. The cardiomediastinal silhouette is stable. No pneumothorax. Bilateral patchy pulmonary infiltrates have worsened in the interval. IMPRESSION: Increasing bilateral pulmonary infiltrates may represent diffuse multifocal pneumonia versus developing asymmetric edema. Recommend clinical correlation and short-term follow-up to ensure resolution. Electronically Signed   By: Dorise Bullion III M.D   On: 03/22/2020 07:58        Scheduled Meds: . amantadine  100 mg Oral Daily  . amLODipine  5 mg Oral Daily  . azithromycin  500 mg Oral Daily  . budesonide (PULMICORT) nebulizer solution  0.5 mg Nebulization BID  . Chlorhexidine Gluconate Cloth  6 each Topical Daily  . clopidogrel  75 mg Oral Daily  . feeding supplement (ENSURE ENLIVE)  237 mL Oral BID BM  . gabapentin  300 mg Oral Daily  . heparin  5,000 Units Subcutaneous Q8H  . ipratropium-albuterol  3 mL Nebulization Q4H  . lithium carbonate  300 mg Oral BID WC  . methylPREDNISolone (SOLU-MEDROL) injection  40 mg Intravenous Q12H  . Oxcarbazepine  300 mg Oral QHS  . polyethylene glycol  17 g  Oral Daily  . QUEtiapine  100 mg Oral QHS  . QUEtiapine  50 mg Oral QPC breakfast  . senna  1 tablet Oral QHS   Continuous Infusions: . amiodarone 30 mg/hr (03/22/20 1300)  . cefTRIAXone (ROCEPHIN)  IV Stopped (03/22/20 1100)    Assessment & Plan:   Principal Problem:   CAP (community acquired pneumonia) Active Problems:   Essential hypertension, benign   Traumatic brain injury ([place])   Seizures ([place])   Bipolar affective disorder ([place])   Artificial cardiac pacemaker   COPD with acute exacerbation ([place])   AKI (acute kidney injury) ([place])   Acute respiratory failure with hypoxia ([place])   CAP (community acquired pneumonia) Still quite symptomatic and become short of breath with  conversation, on BiPAP Continue. IV rocephin, PO zithromax.   Blood cx pending  Incentive spirometry/pulmonary toileting. Keep O2 sat above 90% Lactate decreasing, will continue to monitor  Acute respiratory failure with hypoxia (HCC) Continue with supplemental O2. Husband(keith) states he cannot afford pt's co-pay for supplemental O2. Pt has needed supplemental O2 for at least 6 months according to husband. Duo nebs, see below   Essential hypertension, benign Resume home amlodipine at 5mg  increase as tolerated.   COPD with acute exacerbation ([place]) Continue with duonebs q6h.  Continue iv steroid 40mg   Pulmonary following-wean FiO2 as tolerated keeping O2 sat greater than 88% Incentive spirometer  AKI (acute kidney injury) ([place]) Hold her ARB. Continue with IVF. Husband had stated he given her ibuprofen every day due to pt having a headache. Hold NSAIDs for now.  Creatinine improving, today 1.54.  Avoid for toxic agents   Traumatic brain injury ([place]) Chronic.  Seizures ([place]) Continue her home seizure meds.  Bipolar affective disorder (HCC) Chronic.on lithium  Artificial cardiac pacemaker Stable.   PAFib rvr- on amiodarone gtt, now in nsr Cardiologist input was appreciated  recommended continuing amiodarone for now as this is likely due to her pneumonia and COPD exacerbation and possibly likely will be transient. Will defer chronic anticoagulation at present as this appears to be transient A. fib although this will need to be addressed prior to discharge.   Current smoker-discussed extensively with patient and her husband that patient avoiding smoking at all cost she verbalizes understanding Ongoing complications of smoking discussed with the patient  Hypokalemia Will replace Monitor levels    DVT prophylaxis: Heparin subcu Code Status: Full Family Communication: None at bedside Disposition Plan: Back home when stable Barrier: Patient quite short of breath and requiring IV steroids and IV antibiotics not medically stable for discharge.  Currently on BiPAP Anticipated discharge date: To be determined once medically stable as above       LOS: 2 days   Time spent: 45 minutes with more than 50% on [place], MD [facility] Pager [phone]  If 7PM-7AM, please contact night-coverage [email or web address] Password [email or web address] 03/22/2020, 1:46 PM

## 2020-03-22 NOTE — Plan of Care (Signed)
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
## 2020-03-22 NOTE — Progress Notes (Signed)
CRITICAL CARE NOTE Admitted for COPD exacerbation and Multifocal pneumonia complciated by afib with RVR  CC  follow up respiratory failure  SUBJECTIVE Prognosis is guarded On biPAP Alert and awake +pneumonia    BP (!) 148/106   Pulse 72   Temp 97.7 F (36.5 C) (Axillary)   Resp 20   Ht 5\' 6"  (1.676 m)   Wt 64.2 kg   SpO2 97%   BMI 22.84 kg/m    I/O last 3 completed shifts: In: 2277.1 [I.V.:1631.6; IV Piggyback:645.6] Out: 1150 [Urine:1150] No intake/output data recorded.  SpO2: 97 % O2 Flow Rate (L/min): 6 L/min FiO2 (%): 35 %  Estimated body mass index is 22.84 kg/m as calculated from the following:   Height as of this encounter: 5\' 6"  (1.676 m).   Weight as of this encounter: 64.2 kg.   Review of Systems:  Gen:  Denies  fever, sweats, chills weight loss  HEENT: Denies blurred vision, double vision, ear pain, eye pain, hearing loss, nose bleeds, sore throat Cardiac:  No dizziness, chest pain or heaviness, chest tightness,edema, No JVD Resp:   No cough, -sputum production, +shortness of breath,-wheezing, -hemoptysis,  Other:  All other systems negative       PHYSICAL EXAMINATION:  GENERAL:critically ill appearing, +minimal resp distress HEAD: Normocephalic, atraumatic.  EYES: Pupils equal, round, reactive to light.  No scleral icterus.  MOUTH: Moist mucosal membrane. NECK: Supple.  PULMONARY: +rhonchi, +wheezing CARDIOVASCULAR: S1 and S2. Regular rate and rhythm. No murmurs, rubs, or gallops.  GASTROINTESTINAL: Soft, nontender, -distended.  Positive bowel sounds.   MUSCULOSKELETAL: No swelling, clubbing, or edema.  NEUROLOGIC: alert and awake SKIN:intact,warm,dry  MEDICATIONS: I have reviewed all medications and confirmed regimen as documented   CULTURE RESULTS   Recent Results (from the past 240 hour(s))  SARS Coronavirus 2 by RT PCR (hospital order, performed in [facility] hospital lab) Nasopharyngeal Nasopharyngeal Swab     Status: None    Collection Time: 03/20/20  8:19 PM   Specimen: Nasopharyngeal Swab  Result Value Ref Range Status   SARS Coronavirus 2 NEGATIVE NEGATIVE Final    Comment: (NOTE) SARS-CoV-2 target nucleic acids are NOT DETECTED.  The SARS-CoV-2 RNA is generally detectable in upper and lower respiratory specimens during the acute phase of infection. The lowest concentration of SARS-CoV-2 viral copies this assay can detect is 250 copies / mL. A negative result does not preclude SARS-CoV-2 infection and should not be used as the sole basis for treatment or other patient management decisions.  A negative result may occur with improper specimen collection / handling, submission of specimen other than nasopharyngeal swab, presence of viral mutation(s) within the areas targeted by this assay, and inadequate number of viral copies (<250 copies / mL). A negative result must be combined with clinical observations, patient history, and epidemiological information.  Fact Sheet for Patients:   StrictlyIdeas.no  Fact Sheet for Healthcare Providers: BankingDealers.co.za  This test is not yet approved or  cleared by the [place] FDA and has been authorized for detection and/or diagnosis of SARS-CoV-2 by FDA under an Emergency Use Authorization (EUA).  This EUA will remain in effect (meaning this test can be used) for the duration of the COVID-19 declaration under Section 564(b)(1) of the Act, 21 U.S.C. section 360bbb-3(b)(1), unless the authorization is terminated or revoked sooner.  Performed at [facility], [address] [place], [place] [address]   Blood Culture (routine x 2)     Status: None (Preliminary result)  Collection Time: 03/20/20  9:17 PM   Specimen: BLOOD  Result Value Ref Range Status   Specimen Description BLOOD LEFT FOREARM  Final   Special Requests   Final    BOTTLES DRAWN AEROBIC AND ANAEROBIC Blood Culture results may not  be optimal due to an inadequate volume of blood received in culture bottles   Culture   Final    NO GROWTH < 12 HOURS Performed at [facility], [address], [place], [place] [address]    Report Status PENDING  Incomplete  Blood Culture (routine x 2)     Status: None (Preliminary result)   Collection Time: 03/20/20 10:11 PM   Specimen: BLOOD  Result Value Ref Range Status   Specimen Description BLOOD RIGHT FOREARM  Final   Special Requests   Final    BOTTLES DRAWN AEROBIC AND ANAEROBIC Blood Culture results may not be optimal due to an inadequate volume of blood received in culture bottles   Culture   Final    NO GROWTH < 12 HOURS Performed at [facility], [address] [place], [place] [address]    Report Status PENDING  Incomplete  MRSA PCR Screening     Status: None   Collection Time: 03/21/20  2:07 AM   Specimen: Nasal Mucosa; Nasopharyngeal  Result Value Ref Range Status   MRSA by PCR NEGATIVE NEGATIVE Final    Comment:        The GeneXpert MRSA Assay (FDA approved for NASAL specimens only), is one component of a comprehensive MRSA colonization surveillance program. It is not intended to diagnose MRSA infection nor to guide or monitor treatment for MRSA infections. Performed at [facility], [address] [place], [place] [address]          ASSESSMENT AND PLAN SYNOPSIS  SEVERE COPD EXACERBATION -continue IV steroids as prescribed 40 BID -continue NEB THERAPY as prescribed -morphine as needed -wean fio2 as needed and tolerated IV abx sat goal >88%  Afib with RVR On amio Follow up cardiology recs   ACUTE KIDNEY INJURY/Renal Failure -continue Foley Catheter-assess need -Avoid nephrotoxic agents -Follow urine output, BMP -Ensure adequate renal perfusion, optimize oxygenation -Renal dose medications   CARDIAC ICU monitoring  ID -continue IV abx as prescibed -follow up cultures  GI GI PROPHYLAXIS as  indicated  DIET--> as tolerated Constipation protocol as indicated  ENDO - will use ICU hypoglycemic\Hyperglycemia protocol if indicated     ELECTROLYTES -follow labs as needed -replace as needed -pharmacy consultation and following   DVT/GI PRX ordered and assessed TRANSFUSIONS AS NEEDED MONITOR FSBS I Assessed the need for Labs I Assessed the need for Foley I Assessed the need for Central Venous Line Family Discussion when available I Assessed the need for Mobilization I made an Assessment of medications to be adjusted accordingly Safety Risk assessment completed    Corrin Parker, M.D.  [facility]  Medical Director [place] Director [place] Department

## 2020-03-22 NOTE — Progress Notes (Signed)
Patient trialed on HFNC 15L at 2210, however, patient unable to maintain SATs above 92%, placed back on Bipap previous settings. Patient tolerating well. Will continue to monitor

## 2020-03-23 ENCOUNTER — Inpatient Hospital Stay: Payer: Self-pay

## 2020-03-23 ENCOUNTER — Inpatient Hospital Stay
Admit: 2020-03-23 | Discharge: 2020-03-23 | Disposition: A | Discharge status: Home / Self Care | Payer: Medicare Other | Attending: Internal Medicine | Admitting: Internal Medicine

## 2020-03-23 DIAGNOSIS — N179 Acute kidney failure, unspecified: Secondary | ICD-10-CM | POA: Diagnosis not present

## 2020-03-23 DIAGNOSIS — J189 Pneumonia, unspecified organism: Secondary | ICD-10-CM | POA: Diagnosis not present

## 2020-03-23 DIAGNOSIS — J441 Chronic obstructive pulmonary disease with (acute) exacerbation: Secondary | ICD-10-CM | POA: Diagnosis not present

## 2020-03-23 DIAGNOSIS — Z950 Presence of cardiac pacemaker: Secondary | ICD-10-CM | POA: Diagnosis not present

## 2020-03-23 DIAGNOSIS — J9601 Acute respiratory failure with hypoxia: Secondary | ICD-10-CM | POA: Diagnosis not present

## 2020-03-23 LAB — BASIC METABOLIC PANEL
Anion gap: 9 (ref 5–15)
BUN: 39 mg/dL — ABNORMAL HIGH (ref 8–23)
CO2: 21 mmol/L — ABNORMAL LOW (ref 22–32)
Calcium: 9.9 mg/dL (ref 8.9–10.3)
Chloride: 110 mmol/L (ref 98–111)
Creatinine, Ser: 1.33 mg/dL — ABNORMAL HIGH (ref 0.44–1.00)
GFR calc Af Amer: 48 mL/min — ABNORMAL LOW (ref >60)
GFR calc non Af Amer: 42 mL/min — ABNORMAL LOW (ref >60)
Glucose, Bld: 170 mg/dL — ABNORMAL HIGH (ref 70–99)
Potassium: 3.8 mmol/L (ref 3.5–5.1)
Sodium: 140 mmol/L (ref 135–145)

## 2020-03-23 LAB — COMPREHENSIVE METABOLIC PANEL
ALT: 34 U/L (ref 0–44)
AST: 50 U/L — ABNORMAL HIGH (ref 15–41)
Albumin: 2.6 g/dL — ABNORMAL LOW (ref 3.5–5.0)
Alkaline Phosphatase: 71 U/L (ref 38–126)
Anion gap: 12 (ref 5–15)
BUN: 41 mg/dL — ABNORMAL HIGH (ref 8–23)
CO2: 20 mmol/L — ABNORMAL LOW (ref 22–32)
Calcium: 10.2 mg/dL (ref 8.9–10.3)
Chloride: 107 mmol/L (ref 98–111)
Creatinine, Ser: 1.4 mg/dL — ABNORMAL HIGH (ref 0.44–1.00)
GFR calc Af Amer: 46 mL/min — ABNORMAL LOW (ref >60)
GFR calc non Af Amer: 39 mL/min — ABNORMAL LOW (ref >60)
Glucose, Bld: 245 mg/dL — ABNORMAL HIGH (ref 70–99)
Potassium: 3.6 mmol/L (ref 3.5–5.1)
Sodium: 139 mmol/L (ref 135–145)
Total Bilirubin: 0.5 mg/dL (ref 0.3–1.2)
Total Protein: 6.9 g/dL (ref 6.5–8.1)

## 2020-03-23 LAB — MAGNESIUM
Magnesium: 2.1 mg/dL (ref 1.7–2.4)
Magnesium: 2 mg/dL (ref 1.7–2.4)

## 2020-03-23 LAB — BLOOD GAS, ARTERIAL
Acid-base deficit: 1.5 mmol/L (ref 0.0–2.0)
Acid-base deficit: 0.7 mmol/L (ref 0.0–2.0)
Bicarbonate: 22.9 mmol/L (ref 20.0–28.0)
Bicarbonate: 24.2 mmol/L (ref 20.0–28.0)
Delivery systems: POSITIVE
Expiratory PAP: 5
FIO2: 0.4
FIO2: 0.5
Inspiratory PAP: 10
O2 Saturation: 94.9 %
O2 Saturation: 91.8 %
Patient temperature: 37
Patient temperature: 37
pCO2 arterial: 37 mmHg (ref 32.0–48.0)
pCO2 arterial: 40 mmHg (ref 32.0–48.0)
pH, Arterial: 7.4 (ref 7.350–7.450)
pH, Arterial: 7.39 (ref 7.350–7.450)
pO2, Arterial: 75 mmHg — ABNORMAL LOW (ref 83.0–108.0)
pO2, Arterial: 64 mmHg — ABNORMAL LOW (ref 83.0–108.0)

## 2020-03-23 LAB — CBC
HCT: 39.7 % (ref 36.0–46.0)
Hemoglobin: 12.7 g/dL (ref 12.0–15.0)
MCH: 30 pg (ref 26.0–34.0)
MCHC: 32 g/dL (ref 30.0–36.0)
MCV: 93.9 fL (ref 80.0–100.0)
Platelets: 122 10*3/uL — ABNORMAL LOW (ref 150–400)
RBC: 4.23 MIL/uL (ref 3.87–5.11)
RDW: 15 % (ref 11.5–15.5)
WBC: 9 10*3/uL (ref 4.0–10.5)
nRBC: 0 % (ref 0.0–0.2)

## 2020-03-23 LAB — PHOSPHORUS
Phosphorus: 4 mg/dL (ref 2.5–4.6)
Phosphorus: 3.5 mg/dL (ref 2.5–4.6)

## 2020-03-23 LAB — SARS CORONAVIRUS 2 BY RT PCR (HOSPITAL ORDER, PERFORMED IN ~~LOC~~ HOSPITAL LAB): SARS Coronavirus 2: NEGATIVE

## 2020-03-23 LAB — LITHIUM LEVEL: Lithium Lvl: 1.19 mmol/L (ref 0.60–1.20)

## 2020-03-23 MED ORDER — ADULT MULTIVITAMIN W/MINERALS CH
1.0000 | ORAL_TABLET | Freq: Every day | ORAL | Status: DC
  Administered 2020-03-25 – 2020-03-26 (×2): 1 via ORAL
  Filled 2020-03-23 (×2): qty 1

## 2020-03-23 MED ORDER — LORAZEPAM 2 MG/ML IJ SOLN
0.0000 mg | Freq: Four times a day (QID) | INTRAMUSCULAR | Status: AC
  Administered 2020-03-23 (×3): 2 mg via INTRAVENOUS
  Administered 2020-03-24: 4 mg via INTRAVENOUS
  Administered 2020-03-25: 2 mg via INTRAVENOUS
  Filled 2020-03-23 (×4): qty 1
  Filled 2020-03-23: qty 2

## 2020-03-23 MED ORDER — METHYLPREDNISOLONE SODIUM SUCC 40 MG IJ SOLR
20.0000 mg | Freq: Two times a day (BID) | INTRAMUSCULAR | Status: DC
  Administered 2020-03-23 – 2020-03-29 (×12): 20 mg via INTRAVENOUS
  Filled 2020-03-23 (×11): qty 1

## 2020-03-23 MED ORDER — FOLIC ACID 1 MG PO TABS
1.0000 mg | ORAL_TABLET | Freq: Every day | ORAL | Status: DC
  Administered 2020-03-25 – 2020-03-26 (×2): 1 mg via ORAL
  Filled 2020-03-23 (×2): qty 1

## 2020-03-23 MED ORDER — LORAZEPAM 2 MG/ML IJ SOLN
0.0000 mg | Freq: Two times a day (BID) | INTRAMUSCULAR | Status: AC
  Administered 2020-03-25: 2 mg via INTRAVENOUS
  Filled 2020-03-23: qty 1

## 2020-03-23 MED ORDER — THIAMINE HCL 100 MG PO TABS
100.0000 mg | ORAL_TABLET | Freq: Every day | ORAL | Status: DC
  Administered 2020-03-25 – 2020-04-07 (×10): 100 mg via ORAL
  Filled 2020-03-23 (×11): qty 1

## 2020-03-23 MED ORDER — THIAMINE HCL 100 MG/ML IJ SOLN
100.0000 mg | Freq: Every day | INTRAMUSCULAR | Status: DC
  Administered 2020-03-26 – 2020-04-03 (×4): 100 mg via INTRAVENOUS
  Filled 2020-03-23 (×5): qty 2

## 2020-03-23 MED ORDER — LORAZEPAM 2 MG/ML IJ SOLN
1.0000 mg | INTRAMUSCULAR | Status: AC | PRN
  Administered 2020-03-23 – 2020-03-24 (×3): 2 mg via INTRAVENOUS
  Administered 2020-03-24: 3 mg via INTRAVENOUS
  Filled 2020-03-23: qty 1
  Filled 2020-03-23: qty 2
  Filled 2020-03-23 (×2): qty 1

## 2020-03-23 MED ORDER — LORAZEPAM 1 MG PO TABS: 1.0000 mg | ORAL_TABLET | ORAL | Status: AC | PRN

## 2020-03-23 MED ORDER — MIDAZOLAM HCL 2 MG/2ML IJ SOLN
INTRAMUSCULAR | Status: AC
  Filled 2020-03-23: qty 4

## 2020-03-23 MED ORDER — SODIUM CHLORIDE 0.9% FLUSH
10.0000 mL | Freq: Two times a day (BID) | INTRAVENOUS | Status: DC
  Administered 2020-03-23 – 2020-03-27 (×7): 10 mL
  Administered 2020-03-27: 30 mL
  Administered 2020-03-28: 40 mL
  Administered 2020-03-28 – 2020-03-29 (×2): 10 mL
  Administered 2020-03-29: 40 mL
  Administered 2020-03-30 – 2020-03-31 (×3): 10 mL
  Administered 2020-04-01: 20 mL
  Administered 2020-04-01 – 2020-04-02 (×2): 10 mL
  Administered 2020-04-02: 20 mL
  Administered 2020-04-03 – 2020-04-05 (×6): 10 mL
  Administered 2020-04-06: 30 mL
  Administered 2020-04-06 – 2020-04-07 (×2): 10 mL

## 2020-03-23 MED ORDER — VECURONIUM BROMIDE 10 MG IV SOLR
INTRAVENOUS | Status: AC
  Filled 2020-03-23: qty 10

## 2020-03-23 MED ORDER — SODIUM CHLORIDE 0.9% FLUSH: 10.0000 mL | INTRAVENOUS | Status: DC | PRN

## 2020-03-23 MED ORDER — FENTANYL CITRATE (PF) 100 MCG/2ML IJ SOLN
INTRAMUSCULAR | Status: AC
  Filled 2020-03-23: qty 2

## 2020-03-23 MED ORDER — DEXMEDETOMIDINE HCL IN NACL 400 MCG/100ML IV SOLN
0.4000 ug/kg/h | INTRAVENOUS | Status: DC
  Administered 2020-03-23 (×2): 0.4 ug/kg/h via INTRAVENOUS
  Administered 2020-03-24: 0.8 ug/kg/h via INTRAVENOUS
  Filled 2020-03-23 (×3): qty 100

## 2020-03-23 MED ORDER — STERILE WATER FOR INJECTION IJ SOLN
INTRAMUSCULAR | Status: AC
  Filled 2020-03-23: qty 10

## 2020-03-23 NOTE — Progress Notes (Signed)
  Peripherally Inserted Central Catheter Placement  The IV Nurse has discussed with the patient and/or persons authorized to consent for the patient, the purpose of this procedure and the potential benefits and risks involved with this procedure.  The benefits include less needle sticks, lab draws from the catheter, and the patient may be discharged home with the catheter. Risks include, but not limited to, infection, bleeding, blood clot (thrombus formation), and puncture of an artery; nerve damage and irregular heartbeat and possibility to perform a PICC exchange if needed/ordered by physician.  Alternatives to this procedure were also discussed.  Bard Power PICC patient education guide, fact sheet on infection prevention and patient information card has been provided to patient /or left at bedside. Consent given by husband at bedside.   PICC Placement Documentation  PICC Triple Lumen 03/23/20 PICC Right Brachial 35 cm 0 cm (Active)  Indication for Insertion or Continuance of Line Chronic illness with exacerbations (CF, Sickle Cell, etc.);Prolonged intravenous therapies 03/23/20 1300  Exposed Catheter (cm) 0 cm 03/23/20 1300  Site Assessment Clean;Dry;Intact 03/23/20 1300  Lumen #1 Status Flushed;Blood return noted;Saline locked 03/23/20 1300  Lumen #2 Status Flushed;Blood return noted;Saline locked 03/23/20 1300  Lumen #3 Status Flushed;Blood return noted;Saline locked 03/23/20 1300  Dressing Type Transparent 03/23/20 1300  Dressing Status Clean;Dry;Intact;Antimicrobial disc in place 03/23/20 1300  Safety Lock Not Applicable 30/07/62 2633  Line Care Connections checked and tightened 03/23/20 1300  Line Adjustment (NICU/IV Team Only) No 03/23/20 1300  Dressing Intervention New dressing 03/23/20 1300  Dressing Change Due 03/30/20 03/23/20 [place], [name] 03/23/2020, 2:00 PM
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
## 2020-03-23 NOTE — Progress Notes (Signed)
PT Cancellation Note  Patient Details Name: Cynthia [name] MRN: [id number] DOB: [birth year]   Cancelled Treatment:    Reason Eval/Treat Not Completed: Medical issues which prohibited therapy (Pt has had acute decline in respiratory status since PT evaluation, still between biPAP and HFNC last night/this morning. Will hold PT treatment until respiratory status is appropriate.)  11:32 AM, 03/23/20 Etta Grandchild, PT, DPT Physical Therapist - [facility]  [phone] ([place])    Cynthia [name] 03/23/2020, 11:32 AM

## 2020-03-23 NOTE — Progress Notes (Signed)
Patient was sedated and unable to do [place] at this time, CPT by bed was attempted but patient got agitated and CPT had to be stopped.

## 2020-03-23 NOTE — Progress Notes (Signed)
PROGRESS NOTE    Cynthia [name]  TDD:[id number] DOB: [birth year] DOA: 03/20/2020 PCP: Delsa Grana, PA-C    Brief Narrative:  64 year old female extensive mental health history including bipolar disorder, history of traumatic brain disorder, hypertension, coronary disease, peripheral vascular disease, COPD, chronic tobacco abuse, seizure disorder presents the ER today with several days of worsening shortness of breath and a cough.  Patient started having abdominal pain today.  No fevers.  Patient states that she has felt chilled at home.  Husband came home from work.  Patient has been having abdominal pain.  CT of her abdomen pelvis demonstrated a dense left lower lobe pneumonia    Consultants:   pccm  Procedures:  Antimicrobials:   Ceftriaxone and azithromycin   Subjective: Husband at bedside.  Patient on BiPAP.  Reports feeling still short of breath.  No other complaints.  On telemetry remains in sinus rhythm.  Objective: Vitals:   03/23/20 0500 03/23/20 0600 03/23/20 0754 03/23/20 0833  BP: (!) 156/61 (!) 156/77    Pulse: 74 82 71   Resp: (!) 23 (!) 23 (!) 22   Temp:      TempSrc:      SpO2: 93% 96% 100% 99%  Weight:      Height:        Intake/Output Summary (Last 24 hours) at 03/23/2020 1106 Last data filed at 03/23/2020 0700 Gross per 24 hour  Intake 460.51 ml  Output 700 ml  Net -239.49 ml   Filed Weights   03/20/20 1920 03/21/20 0211 03/22/20 0600  Weight: 63.5 kg 65.3 kg 64.2 kg    Examination:  General exam: Becomes short of breath when, stating, on BiPAP, husband at bedside Respiratory system: Increased expiratory time, clear to auscultation, minimal wheezing Cardiovascular system: S1 & S2 heard, RRR. No JVD, murmurs, rubs, gallops or clicks. Gastrointestinal system: Abdomen is nondistended, soft and nontender.Normal bowel sounds heard. Central nervous system: Alert and oriented.  Grossly intact Extremities: No edema Skin: Warm and dry Psychiatry:  Judgement and insight appear normal. Mood & affect appropriate.     Data Reviewed: I have personally reviewed following labs and imaging studies  CBC: Recent Labs  Lab 03/20/20 1955 03/21/20 0240 03/21/20 0638 03/22/20 0304 03/23/20 1027  WBC 6.8 5.2 3.7* 3.6* 9.0  NEUTROABS  --  3.5  --   --   --   HGB 13.9 13.4 12.6 12.1 12.7  HCT 42.6 42.0 39.0 37.0 39.7  MCV 93.6 94.2 93.8 91.6 93.9  PLT 126* 113* 104* 113* 623*   Basic Metabolic Panel: Recent Labs  Lab 03/20/20 1955 03/21/20 0240 03/21/20 0416 03/22/20 0304 03/23/20 0300  NA 131* 134* 136 140 140  K 3.8 4.2 4.2 3.2* 3.8  CL 99 105 108 111 110  CO2 20* 19* 20* 21* 21*  GLUCOSE 127* 160* 171* 170* 170*  BUN 48* 47* 46* 41* 39*  CREATININE 3.10* 2.55* 2.41* 1.54* 1.33*  CALCIUM 9.5 9.2 9.0 10.1 9.9  MG  --   --   --   --  2.1  PHOS  --   --   --   --  4.0   GFR: Estimated Creatinine Clearance: 39.5 mL/min (A) (by C-G formula based on SCr of 1.33 mg/dL (H)). Liver Function Tests: Recent Labs  Lab 03/20/20 1955 03/21/20 0240 03/21/20 0416  AST 15 13* 12*  ALT 8 7 7   ALKPHOS 45 41 34*  BILITOT 0.9 0.9 0.9  PROT 7.4 7.1 6.4*  ALBUMIN 3.4* 3.1*  2.9*   Recent Labs  Lab 03/20/20 1955  LIPASE 16   Recent Labs  Lab 03/21/20 0241  AMMONIA 25   Coagulation Profile: No results for input(s): INR, PROTIME in the last 168 hours. Cardiac Enzymes: No results for input(s): CKTOTAL, CKMB, CKMBINDEX, TROPONINI in the last 168 hours. BNP (last 3 results) No results for input(s): PROBNP in the last 8760 hours. HbA1C: No results for input(s): HGBA1C in the last 72 hours. CBG: Recent Labs  Lab 03/21/20 0210  GLUCAP 146*   Lipid Profile: No results for input(s): CHOL, HDL, LDLCALC, TRIG, CHOLHDL, LDLDIRECT in the last 72 hours. Thyroid Function Tests: No results for input(s): TSH, T4TOTAL, FREET4, T3FREE, THYROIDAB in the last 72 hours. Anemia Panel: No results for input(s): VITAMINB12, FOLATE, FERRITIN,  TIBC, IRON, RETICCTPCT in the last 72 hours. Sepsis Labs: Recent Labs  Lab 03/20/20 1955 03/20/20 2211 03/21/20 0241 03/21/20 0416 03/22/20 1418 03/22/20 1719  PROCALCITON 4.16  --   --   --   --   --   LATICACIDVEN  --    < > 3.1* 2.2* 1.9 1.6   < > = values in this interval not displayed.    Recent Results (from the past 240 hour(s))  SARS Coronavirus 2 by RT PCR (hospital order, performed in [facility] hospital lab) Nasopharyngeal Nasopharyngeal Swab     Status: None   Collection Time: 03/20/20  8:19 PM   Specimen: Nasopharyngeal Swab  Result Value Ref Range Status   SARS Coronavirus 2 NEGATIVE NEGATIVE Final    Comment: (NOTE) SARS-CoV-2 target nucleic acids are NOT DETECTED.  The SARS-CoV-2 RNA is generally detectable in upper and lower respiratory specimens during the acute phase of infection. The lowest concentration of SARS-CoV-2 viral copies this assay can detect is 250 copies / mL. A negative result does not preclude SARS-CoV-2 infection and should not be used as the sole basis for treatment or other patient management decisions.  A negative result may occur with improper specimen collection / handling, submission of specimen other than nasopharyngeal swab, presence of viral mutation(s) within the areas targeted by this assay, and inadequate number of viral copies (<250 copies / mL). A negative result must be combined with clinical observations, patient history, and epidemiological information.  Fact Sheet for Patients:   StrictlyIdeas.no  Fact Sheet for Healthcare Providers: BankingDealers.co.za  This test is not yet approved or  cleared by the [place] FDA and has been authorized for detection and/or diagnosis of SARS-CoV-2 by FDA under an Emergency Use Authorization (EUA).  This EUA will remain in effect (meaning this test can be used) for the duration of the COVID-19 declaration under Section 564(b)(1) of  the Act, 21 U.S.C. section 360bbb-3(b)(1), unless the authorization is terminated or revoked sooner.  Performed at [facility], [address] [place], [place] [address]   Blood Culture (routine x 2)     Status: None (Preliminary result)   Collection Time: 03/20/20  9:17 PM   Specimen: BLOOD  Result Value Ref Range Status   Specimen Description BLOOD LEFT FOREARM  Final   Special Requests   Final    BOTTLES DRAWN AEROBIC AND ANAEROBIC Blood Culture results may not be optimal due to an inadequate volume of blood received in culture bottles   Culture   Final    NO GROWTH 3 DAYS Performed at [facility], [address], [place], [place] [address]    Report Status PENDING  Incomplete  Blood Culture (routine  x 2)     Status: None (Preliminary result)   Collection Time: 03/20/20 10:11 PM   Specimen: BLOOD  Result Value Ref Range Status   Specimen Description BLOOD RIGHT FOREARM  Final   Special Requests   Final    BOTTLES DRAWN AEROBIC AND ANAEROBIC Blood Culture results may not be optimal due to an inadequate volume of blood received in culture bottles   Culture   Final    NO GROWTH 3 DAYS Performed at [facility] [facility], [address], [place], [place] [address]    Report Status PENDING  Incomplete  MRSA PCR Screening     Status: None   Collection Time: 03/21/20  2:07 AM   Specimen: Nasal Mucosa; Nasopharyngeal  Result Value Ref Range Status   MRSA by PCR NEGATIVE NEGATIVE Final    Comment:        The GeneXpert MRSA Assay (FDA approved for NASAL specimens only), is one component of a comprehensive MRSA colonization surveillance program. It is not intended to diagnose MRSA infection nor to guide or monitor treatment for MRSA infections. Performed at [facility], [address] [place], [place] [address]          Radiology Studies: DG Chest [facility] 1 View  Result Date: 03/22/2020 CLINICAL DATA:  Acute respiratory failure EXAM:  PORTABLE CHEST 1 VIEW COMPARISON:  March 21, 2020 FINDINGS: Stable pacemaker. The cardiomediastinal silhouette is stable. No pneumothorax. Bilateral patchy pulmonary infiltrates have worsened in the interval. IMPRESSION: Increasing bilateral pulmonary infiltrates may represent diffuse multifocal pneumonia versus developing asymmetric edema. Recommend clinical correlation and short-term follow-up to ensure resolution. Electronically Signed   By: Dorise Bullion III M.D   On: 03/22/2020 07:58        Scheduled Meds: . fentaNYL      . midazolam      . sterile water (preservative free)      . vecuronium      . amantadine  100 mg Oral Daily  . amLODipine  5 mg Oral Daily  . azithromycin  500 mg Oral Daily  . budesonide (PULMICORT) nebulizer solution  0.5 mg Nebulization BID  . Chlorhexidine Gluconate Cloth  6 each Topical Daily  . clopidogrel  75 mg Oral Daily  . feeding supplement (ENSURE ENLIVE)  237 mL Oral BID BM  . folic acid  1 mg Oral Daily  . gabapentin  300 mg Oral Daily  . heparin  5,000 Units Subcutaneous Q8H  . ipratropium-albuterol  3 mL Nebulization Q4H  . lithium carbonate  300 mg Oral BID WC  . LORazepam  0-4 mg Intravenous Q6H   Followed by  . [START ON 03/25/2020] LORazepam  0-4 mg Intravenous Q12H  . methylPREDNISolone (SOLU-MEDROL) injection  20 mg Intravenous Q12H  . multivitamin with minerals  1 tablet Oral Daily  . Oxcarbazepine  300 mg Oral QHS  . polyethylene glycol  17 g Oral Daily  . QUEtiapine  100 mg Oral QHS  . QUEtiapine  50 mg Oral QPC breakfast  . senna  1 tablet Oral QHS  . thiamine  100 mg Oral Daily   Or  . thiamine  100 mg Intravenous Daily   Continuous Infusions: . amiodarone 30 mg/hr (03/23/20 0700)  . cefTRIAXone (ROCEPHIN)  IV 2 g (03/23/20 0905)    Assessment & Plan:   Principal Problem:   CAP (community acquired pneumonia) Active Problems:   Essential hypertension, benign   Traumatic brain injury ([place])   Seizures ([place])  Bipolar  affective disorder ([place])   Artificial cardiac pacemaker   COPD with acute exacerbation ([place])   AKI (acute kidney injury) ([place])   Acute respiratory failure with hypoxia ([place])   CAP (community acquired pneumonia) Pt clinically looking worse today. Tiring and confused. Continue. IV rocephin, PO zithromax.  Blood cx pending- ntd Incentive spirometry/pulmonary toileting. Keep O2 sat above 90% Plan: Spoke to Dr. Mortimer Fries about pt likely needs intubation, he is in agreement.  Acute respiratory failure with hypoxia (HCC) Continue with supplemental O2. Husband(keith) states he cannot afford pt's co-pay for supplemental O2. Pt has needed supplemental O2 for at least 6 months according to husband. Duo nebs, see below Plan:  Intubation as above   Essential hypertension, benign Resume home amlodipine at 5mg  increase as tolerated.   COPD with acute exacerbation ([place]) Continue with duonebs q6h.  Continue iv steroid 40mg   Pulmonary following-wean FiO2 as tolerated keeping O2 sat greater than 88% Incentive spirometer  AKI (acute kidney injury) ([place]) Hold her ARB. Continue with IVF. Husband had stated he given her ibuprofen every day due to pt having a headache. Hold NSAIDs for now.  Creatinine improving, today 1.40.  Avoid for toxic agents   Traumatic brain injury ([place]) Chronic.  Seizures ([place]) Continue her home seizure meds.  Bipolar affective disorder (HCC) Chronic.on lithium  Artificial cardiac pacemaker Stable.   PAFib rvr- on amiodarone gtt, now in nsr Cardiologist input was appreciated recommended continuing amiodarone for now as this is likely due to her pneumonia and COPD exacerbation and possibly likely will be transient. Will defer chronic anticoagulation at present as this appears to be transient A. fib although this will need to be addressed prior to discharge.   Current smoker-discussed extensively with patient and her husband that patient avoiding smoking at  all cost she verbalizes understanding Ongoing complications of smoking discussed with the patient  Hypokalemia Will replace Monitor levels    DVT prophylaxis: Heparin subcu Code Status: Full Family Communication: spoke to daughter this am at bedside about possible intubation Disposition Plan: pt will be intubated, PCCM will take over the care.     LOS: 3 days   Time spent: 45 minutes with more than 50% on [place], MD [facility] Pager [phone]  If 7PM-7AM, please contact night-coverage [email or web address] Password [facility] 03/23/2020, 11:06 AM

## 2020-03-23 NOTE — Progress Notes (Signed)
CRITICAL CARE NOTE Admitted for COPD exacerbation and Multifocal pneumonia complciated by afib with RVR  6/25 admitted for COPD exacerbation pneumonia 6/26 transferred to ICU for afib with RVR 6/27 required biPAP, high flow [place] 6/28 transferred to [facility] service for increased WOB and agitation with biPAP and high flow [place] 6/28 PICC line placed, started precedex high folow [place] 50%/50L  CC  follow up respiratory failure  SUBJECTIVE Patient remains critically ill Prognosis is guarded High risk for intubation and cardiac arrest   BP (!) 156/77   Pulse 71   Temp 98.5 F (36.9 C) (Axillary)   Resp (!) 22   Ht 5\' 6"  (1.676 m)   Wt 64.2 kg   SpO2 100%   BMI 22.84 kg/m    I/O last 3 completed shifts: In: 1125.6 [I.V.:675.8; Other:50; IV Piggyback:399.7] Out: 1300 [Urine:1300] No intake/output data recorded.  SpO2: 100 % O2 Flow Rate (L/min): 50 L/min FiO2 (%): 50 %  Estimated body mass index is 22.84 kg/m as calculated from the following:   Height as of this encounter: 5\' 6"  (1.676 m).   Weight as of this encounter: 64.2 kg.   Review of Systems:  Gen:  +fatigue HEENT: Denies blurred vision, double vision, ear pain, eye pain, hearing loss, nose bleeds, sore throat Cardiac:  No dizziness, chest pain or heaviness, chest tightness,edema, No JVD Resp: +cough, +sputum production, +shortness of breath,-wheezing, -hemoptysis,  +agitated and intermittently confused Other:  All other systems negative        PHYSICAL EXAMINATION:  GENERAL:critically ill appearing, +resp distress HEAD: Normocephalic, atraumatic.  EYES: Pupils equal, round, reactive to light.  No scleral icterus.  MOUTH: Moist mucosal membrane. NECK: Supple.  PULMONARY: +rhonchi, +wheezing CARDIOVASCULAR: S1 and S2. Regular rate and rhythm. No murmurs, rubs, or gallops.  GASTROINTESTINAL: Soft, nontender, -distended.  Positive bowel sounds.   MUSCULOSKELETAL: No swelling, clubbing, or edema.  NEUROLOGIC:  agitated,confused SKIN:intact,warm,dry  MEDICATIONS: I have reviewed all medications and confirmed regimen as documented   CULTURE RESULTS   Recent Results (from the past 240 hour(s))  SARS Coronavirus 2 by RT PCR (hospital order, performed in [facility] hospital lab) Nasopharyngeal Nasopharyngeal Swab     Status: None   Collection Time: 03/20/20  8:19 PM   Specimen: Nasopharyngeal Swab  Result Value Ref Range Status   SARS Coronavirus 2 NEGATIVE NEGATIVE Final    Comment: (NOTE) SARS-CoV-2 target nucleic acids are NOT DETECTED.  The SARS-CoV-2 RNA is generally detectable in upper and lower respiratory specimens during the acute phase of infection. The lowest concentration of SARS-CoV-2 viral copies this assay can detect is 250 copies / mL. A negative result does not preclude SARS-CoV-2 infection and should not be used as the sole basis for treatment or other patient management decisions.  A negative result may occur with improper specimen collection / handling, submission of specimen other than nasopharyngeal swab, presence of viral mutation(s) within the areas targeted by this assay, and inadequate number of viral copies (<250 copies / mL). A negative result must be combined with clinical observations, patient history, and epidemiological information.  Fact Sheet for Patients:   StrictlyIdeas.no  Fact Sheet for Healthcare Providers: BankingDealers.co.za  This test is not yet approved or  cleared by the [place] FDA and has been authorized for detection and/or diagnosis of SARS-CoV-2 by FDA under an Emergency Use Authorization (EUA).  This EUA will remain in effect (meaning this test can be used) for the duration of the COVID-19 declaration under Section 564(b)(1)  of the Act, 21 U.S.C. section 360bbb-3(b)(1), unless the authorization is terminated or revoked sooner.  Performed at [facility], [address], [place], [place] [address]   Blood Culture (routine x 2)     Status: None (Preliminary result)   Collection Time: 03/20/20  9:17 PM   Specimen: BLOOD  Result Value Ref Range Status   Specimen Description BLOOD LEFT FOREARM  Final   Special Requests   Final    BOTTLES DRAWN AEROBIC AND ANAEROBIC Blood Culture results may not be optimal due to an inadequate volume of blood received in culture bottles   Culture   Final    NO GROWTH 3 DAYS Performed at [facility], [address], [place], [place] [address]    Report Status PENDING  Incomplete  Blood Culture (routine x 2)     Status: None (Preliminary result)   Collection Time: 03/20/20 10:11 PM   Specimen: BLOOD  Result Value Ref Range Status   Specimen Description BLOOD RIGHT FOREARM  Final   Special Requests   Final    BOTTLES DRAWN AEROBIC AND ANAEROBIC Blood Culture results may not be optimal due to an inadequate volume of blood received in culture bottles   Culture   Final    NO GROWTH 3 DAYS Performed at [facility], [address], [place], [place] [address]    Report Status PENDING  Incomplete  MRSA PCR Screening     Status: None   Collection Time: 03/21/20  2:07 AM   Specimen: Nasal Mucosa; Nasopharyngeal  Result Value Ref Range Status   MRSA by PCR NEGATIVE NEGATIVE Final    Comment:        The GeneXpert MRSA Assay (FDA approved for NASAL specimens only), is one component of a comprehensive MRSA colonization surveillance program. It is not intended to diagnose MRSA infection nor to guide or monitor treatment for MRSA infections. Performed at [facility], [address] [place], [place] [address]   SARS Coronavirus 2 by RT PCR (hospital order, performed in [place] hospital lab)     Status: None   Collection Time: 03/23/20 10:12 AM  Result Value Ref Range Status   SARS Coronavirus 2 NEGATIVE NEGATIVE Final    Comment: (NOTE) SARS-CoV-2 target nucleic acids are NOT  DETECTED.  The SARS-CoV-2 RNA is generally detectable in upper and lower respiratory specimens during the acute phase of infection. The lowest concentration of SARS-CoV-2 viral copies this assay can detect is 250 copies / mL. A negative result does not preclude SARS-CoV-2 infection and should not be used as the sole basis for treatment or other patient management decisions.  A negative result may occur with improper specimen collection / handling, submission of specimen other than nasopharyngeal swab, presence of viral mutation(s) within the areas targeted by this assay, and inadequate number of viral copies (<250 copies / mL). A negative result must be combined with clinical observations, patient history, and epidemiological information.  Fact Sheet for Patients:   StrictlyIdeas.no  Fact Sheet for Healthcare Providers: BankingDealers.co.za  This test is not yet approved or  cleared by the [place] FDA and has been authorized for detection and/or diagnosis of SARS-CoV-2 by FDA under an Emergency Use Authorization (EUA).  This EUA will remain in effect (meaning this test can be used) for the duration of the COVID-19 declaration under Section 564(b)(1) of the Act, 21 U.S.C. section 360bbb-3(b)(1), unless the authorization is terminated or revoked sooner.  Performed at [facility], [address] [place], [place] [address]           IMAGING    [place] EKG SITE RITE  Result Date: 03/23/2020 If [facility] image not attached, placement could not be confirmed due to current cardiac rhythm.    Nutrition Status: Nutrition Problem: Increased nutrient needs Etiology: acute illness, chronic illness (CAP; COPD) Signs/Symptoms: estimated needs Interventions: Ensure Enlive (each supplement provides 350kcal and 20 grams of protein)  CBC    Component Value Date/Time   WBC 9.0 03/23/2020 1027   RBC 4.23 03/23/2020 1027    HGB 12.7 03/23/2020 1027   HGB 16.1 (H) 09/17/2015 1543   HCT 39.7 03/23/2020 1027   HCT 47.1 (H) 09/17/2015 1543   PLT 122 (L) 03/23/2020 1027   PLT 167 09/17/2015 1543   MCV 93.9 03/23/2020 1027   MCV 91 09/17/2015 1543   MCH 30.0 03/23/2020 1027   MCHC 32.0 03/23/2020 1027   RDW 15.0 03/23/2020 1027   RDW 13.7 09/17/2015 1543   LYMPHSABS 0.3 (L) 03/21/2020 0240   LYMPHSABS 2.4 09/17/2015 1543   MONOABS 0.2 03/21/2020 0240   EOSABS 0.0 03/21/2020 0240   EOSABS 0.1 09/17/2015 1543   BASOSABS 0.1 03/21/2020 0240   BASOSABS 0.1 09/17/2015 1543   BMP Latest Ref Rng & Units 03/23/2020 03/23/2020 03/22/2020  Glucose 70 - 99 mg/dL 245(H) 170(H) 170(H)  BUN 8 - 23 mg/dL 41(H) 39(H) 41(H)  Creatinine 0.44 - 1.00 mg/dL 1.40(H) 1.33(H) 1.54(H)  BUN/Creat Ratio 6 - 22 (calc) - - -  Sodium 135 - 145 mmol/L 139 140 140  Potassium 3.5 - 5.1 mmol/L 3.6 3.8 3.2(L)  Chloride 98 - 111 mmol/L 107 110 111  CO2 22 - 32 mmol/L 20(L) 21(L) 21(L)  Calcium 8.9 - 10.3 mg/dL 10.2 9.9 10.1      Indwelling Urinary Catheter continued, requirement due to   Reason to continue Indwelling Urinary Catheter strict Intake/Output monitoring for hemodynamic instability   Central Line/ continued, requirement due to  Reason to continue [place] of central venous pressure or other hemodynamic parameters and poor IV access      ASSESSMENT AND PLAN SYNOPSIS   Severe ACUTE Hypoxic and Hypercapnic Respiratory Failure due to severe COPD exacerbation with multifocal pneumonia complicated by afib with RVR    Severe ACUTE Hypoxic and Hypercapnic Respiratory Failure -continue biPAP and high flow [place] as tolerated High risk for intubation and cardiac arrest   PROBABLE ACUTE SYSTOLIC CARDIAC FAILURE-ECHO PENDING AFIB WITH RVR -follow up cardiac enzymes as indicated -follow up cardiology recs   ACUTE KIDNEY INJURY/Renal Failure -continue Foley Catheter-assess need -Avoid nephrotoxic  agents -Follow urine output, BMP -Ensure adequate renal perfusion, optimize oxygenation -Renal dose medications     NEUROLOGY Acute toxic metabolic encephalopathy, need for sedation With precedex  CARDIAC ICU monitoring  ID -continue IV abx as prescibed -follow up cultures  GI GI PROPHYLAXIS as indicated  DIET-->NPO Constipation protocol as indicated  ENDO - will use ICU hypoglycemic\Hyperglycemia protocol if indicated     ELECTROLYTES -follow labs as needed -replace as needed -pharmacy consultation and following   DVT/GI PRX ordered and assessed TRANSFUSIONS AS NEEDED MONITOR FSBS I Assessed the need for Labs I Assessed the need for Foley I Assessed the need for Central Venous Line Family Discussion when available I Assessed the need for Mobilization I made an Assessment of medications to be adjusted accordingly Safety Risk assessment completed   CASE DISCUSSED IN MULTIDISCIPLINARY ROUNDS WITH ICU TEAM  Critical Care Time devoted to patient care services described in this note is 36 minutes.   Overall, patient is critically ill, prognosis is guarded.  Patient with Multiorgan failure and at high risk for cardiac arrest and death.    Corrin Parker, M.D.  [facility]  Medical Director [place] Director [facility] Cardio-Pulmonary Department

## 2020-03-23 NOTE — Progress Notes (Addendum)
Patient Name: Cynthia [name] Date of Encounter: 03/23/2020  Hospital Problem List     Principal Problem:   CAP (community acquired pneumonia) Active Problems:   Essential hypertension, benign   Traumatic brain injury ([place])   Seizures ([place])   Bipolar affective disorder ([place])   Artificial cardiac pacemaker   COPD with acute exacerbation ([place])   AKI (acute kidney injury) ([place])   Acute respiratory failure with hypoxia [facility])    Patient Profile     64 y.o. female with history of CAD, second-degree heart block s/p PPM placement, PVD s/p multiple stent placements (11/2019), h/o CVA, emphysema, hypertension, hyperlipidemia, h/o TBI with residual memory deficit, seizures, chronic hepatitis C, bipolar disorder, h/o substance abuse, PTSD and tobacco use who was admitted with increasing shortness of breath and was diagnosed with mild to moderate to bibasilar atelectasis or infiltrate with a small left pleural effusion.  She was noted to be in atrial fibrillation with rapid ventricular response on admission.  Initially responded to IV metoprolol however had recurrence and was placed on IV amiodarone.  Since IV amiodarone has been started, it does not appear she has had any significant atrial arrhythmia previously although is a difficult historian.  Her respiratory status is worsened somewhat.  Chest x-ray done today revealed a stable cardiomediastinal silhouette however bilateral patchy pulmonary infiltrates has worsened.  She currently is on IV amiodarone at 30 mg/h, amlodipine 5 mg daily, clopidogrel 75 mg daily.  She is on IV Rocephin.  She was mildly hypokalemic today and is getting supplementation for this.  She is hemodynamically stable.  Heart rate is currently 69.  She is on BiPAP with 6 L/min and a 95% pulse ox.  She is +1.127 L since admission.  She had acute on chronic renal insufficiency with a bump in her creatinine to 3.10 on admission.  Baseline is normal.  Creatinine is improved to 1.54 with  gentle hydration  Subjective   Required another bolus of amiodarone for afib with rvr.   Inpatient Medications    . amantadine  100 mg Oral Daily  . amLODipine  5 mg Oral Daily  . azithromycin  500 mg Oral Daily  . budesonide (PULMICORT) nebulizer solution  0.5 mg Nebulization BID  . Chlorhexidine Gluconate Cloth  6 each Topical Daily  . clopidogrel  75 mg Oral Daily  . feeding supplement (ENSURE ENLIVE)  237 mL Oral BID BM  . gabapentin  300 mg Oral Daily  . heparin  5,000 Units Subcutaneous Q8H  . ipratropium-albuterol  3 mL Nebulization Q4H  . lithium carbonate  300 mg Oral BID WC  . methylPREDNISolone (SOLU-MEDROL) injection  40 mg Intravenous Q12H  . Oxcarbazepine  300 mg Oral QHS  . polyethylene glycol  17 g Oral Daily  . QUEtiapine  100 mg Oral QHS  . QUEtiapine  50 mg Oral QPC breakfast  . senna  1 tablet Oral QHS    Vital Signs    Vitals:   03/23/20 0300 03/23/20 0400 03/23/20 0500 03/23/20 0600  BP: (!) 147/62 (!) 146/56 (!) 156/61 (!) 156/77  Pulse: 71 72 74 82  Resp: (!) 23 18 (!) 23 (!) 23  Temp:      TempSrc:      SpO2: 95% 94% 93% 96%  Weight:      Height:        Intake/Output Summary (Last 24 hours) at 03/23/2020 0647 Last data filed at 03/23/2020 0557 Gross per 24 hour  Intake 867.33 ml  Output 700 ml  Net 167.33 ml   Filed Weights   03/20/20 1920 03/21/20 0211 03/22/20 0600  Weight: 63.5 kg 65.3 kg 64.2 kg    Physical Exam    GEN: Well nourished, well developed, in no acute distress.  HEENT: normal.  Neck: Supple, no JVD, carotid bruits, or masses. Cardiac: RRR, no murmurs, rubs, or gallops. No clubbing, cyanosis, edema.  Radials/DP/PT 2+ and equal bilaterally.  Respiratory:  Respirations regular and unlabored, clear to auscultation bilaterally. GI: Soft, nontender, nondistended, BS + x 4. MS: no deformity or atrophy. Skin: warm and dry, no rash. Neuro:  Strength and sensation are intact. Psych: Normal affect.  Labs    CBC Recent  Labs    03/21/20 0240 03/21/20 0240 03/21/20 0638 03/22/20 0304  WBC 5.2   < > 3.7* 3.6*  NEUTROABS 3.5  --   --   --   HGB 13.4   < > 12.6 12.1  HCT 42.0   < > 39.0 37.0  MCV 94.2   < > 93.8 91.6  PLT 113*   < > 104* 113*   < > = values in this interval not displayed.   Basic Metabolic Panel Recent Labs    03/22/20 0304 03/23/20 0300  NA 140 140  K 3.2* 3.8  CL 111 110  CO2 21* 21*  GLUCOSE 170* 170*  BUN 41* 39*  CREATININE 1.54* 1.33*  CALCIUM 10.1 9.9  MG  --  2.1  PHOS  --  4.0   Liver Function Tests Recent Labs    03/21/20 0240 03/21/20 0416  AST 13* 12*  ALT 7 7  ALKPHOS 41 34*  BILITOT 0.9 0.9  PROT 7.1 6.4*  ALBUMIN 3.1* 2.9*   Recent Labs    03/20/20 1955  LIPASE 16   Cardiac Enzymes No results for input(s): CKTOTAL, CKMB, CKMBINDEX, TROPONINI in the last 72 hours. BNP No results for input(s): BNP in the last 72 hours. D-Dimer No results for input(s): DDIMER in the last 72 hours. Hemoglobin A1C No results for input(s): HGBA1C in the last 72 hours. Fasting Lipid Panel No results for input(s): CHOL, HDL, LDLCALC, TRIG, CHOLHDL, LDLDIRECT in the last 72 hours. Thyroid Function Tests No results for input(s): TSH, T4TOTAL, T3FREE, THYROIDAB in the last 72 hours.  Invalid input(s): FREET3  Telemetry    afib with rvr converted to nsr  ECG    nsr with no ischemia  Radiology    CT ABDOMEN PELVIS WO CONTRAST  Result Date: 03/20/2020 CLINICAL DATA:  Periumbilical abdominal pain. EXAM: CT ABDOMEN AND PELVIS WITHOUT CONTRAST TECHNIQUE: Multidetector CT imaging of the abdomen and pelvis was performed following the standard protocol without IV contrast. COMPARISON:  None. FINDINGS: Lower chest: Marked severity infiltrates are seen within the posterior aspect of the bilateral lower lobes, left greater than right. Chronic posterior right ninth and tenth rib fractures are seen. Hepatobiliary: No focal liver abnormality is seen. The gallbladder is  markedly distended without evidence of gallstones, gallbladder wall thickening, or biliary dilatation. Pancreas: Unremarkable. No pancreatic ductal dilatation or surrounding inflammatory changes. Spleen: Normal in size without focal abnormality. Adrenals/Urinary Tract: 3.1 cm x 1.5 cm and 1.5 cm x 1.2 cm low-attenuation left adrenal masses are seen. Kidneys are normal, without renal calculi, focal lesion, or hydronephrosis. Bladder is unremarkable. Stomach/Bowel: Stomach is within normal limits. Appendix appears normal. No evidence of bowel wall thickening, distention, or inflammatory changes. Vascular/Lymphatic: There is marked severity calcification of the abdominal aorta. Bilateral common  iliac artery and bilateral external iliac artery stents are seen. No enlarged abdominal or pelvic lymph nodes. Reproductive: Uterus and bilateral adnexa are unremarkable. Other: No abdominal wall hernia or abnormality. No abdominopelvic ascites. Musculoskeletal: Marked severity degenerative changes are seen at the level of L5-S1. IMPRESSION: 1. Marked severity bilateral lower lobe infiltrates, left greater than right. 2. Low-attenuation left adrenal masses which may represent adrenal adenomas. 3. Marked severity calcification of the abdominal aorta. 4. Bilateral common iliac artery and bilateral external iliac artery stents. 5. Marked severity degenerative changes at the level of L5-S1. Aortic Atherosclerosis (ICD10-I70.0). Electronically Signed   By: Virgina Norfolk M.D.   On: 03/20/2020 20:52   DG Chest 1 View  Result Date: 03/21/2020 CLINICAL DATA:  Abdominal distension EXAM: CHEST  1 VIEW COMPARISON:  Radiograph 03/20/2020, CT abdomen pelvis 03/20/2020, CT chest 11/25/2019 FINDINGS: Dual lead pacer pack overlies left chest wall with leads in stable position at the right atrium and cardiac apex. Telemetry leads and nasal cannula overlie the chest. Persistent bibasilar consolidative opacities. Suspect a trace left  effusion. Some fissural and septal thickening is noted with increasing central venous congestion and central cuffing. Could reflect superimposed edema. Remote posttraumatic deformity of the distal right clavicle. No acute osseous or soft tissue abnormality. The aorta is calcified. The remaining cardiomediastinal contours are unremarkable. IMPRESSION: 1. Persistent bibasilar consolidative opacities. 2. Increasing central venous congestion, cuffing and septal thickening. Likely developing interstitial edema. Electronically Signed   By: Lovena Le M.D.   On: 03/21/2020 02:15   DG Chest 2 View  Result Date: 03/20/2020 CLINICAL DATA:  Shortness of breath. EXAM: CHEST - 2 VIEW COMPARISON:  July 11, 2017 FINDINGS: There is a dual lead AICD. Mild to moderate severity areas of atelectasis and/or infiltrate are seen within the bilateral lung bases. There is a small left pleural effusion. No pneumothorax is identified. The heart size and mediastinal contours are within normal limits. The visualized skeletal structures are unremarkable. IMPRESSION: 1. Mild to moderate severity bibasilar atelectasis and/or infiltrate. 2. Small left pleural effusion. Electronically Signed   By: Virgina Norfolk M.D.   On: 03/20/2020 20:53   DG Abd 1 View  Result Date: 03/21/2020 CLINICAL DATA:  Abdominal distension EXAM: ABDOMEN - 1 VIEW COMPARISON:  CT abdomen pelvis 03/20/2020 FINDINGS: Persistent consolidative opacities in the lung bases. Trace left effusion. Cardiomediastinal contours are stable from prior. No high-grade obstructive bowel gas pattern is seen in the abdomen or pelvis. Moderate colonic stool burden. Air and stool projects over the rectal vault. Multiple vascular calcifications noted throughout the upper abdomen including over the renal pelves compatible with findings on CT. No suspicious calcifications. Vascular stenting of the iliac arteries. Multilevel degenerative changes in the spine, hips and pelvis. External  rotation of the right hip limits evaluation of the femoral neck. If there is concern for femoral abnormality, recommend dedicated imaging. IMPRESSION: 1. No high-grade obstructive bowel gas pattern in the abdomen or pelvis. 2. Moderate colonic stool burden. 3. Persistent consolidative opacities in the lung bases. Trace left effusion. Electronically Signed   By: Lovena Le M.D.   On: 03/21/2020 02:12   DG Chest Port 1 View  Result Date: 03/22/2020 CLINICAL DATA:  Acute respiratory failure EXAM: PORTABLE CHEST 1 VIEW COMPARISON:  March 21, 2020 FINDINGS: Stable pacemaker. The cardiomediastinal silhouette is stable. No pneumothorax. Bilateral patchy pulmonary infiltrates have worsened in the interval. IMPRESSION: Increasing bilateral pulmonary infiltrates may represent diffuse multifocal pneumonia versus developing asymmetric edema. Recommend clinical correlation and  short-term follow-up to ensure resolution. Electronically Signed   By: Dorise Bullion III M.D   On: 03/22/2020 07:58    Assessment & Plan    64 y.o.female patient with PMH significant for CAD, second-degree heart block s/p PPM placement, PVD s/p multiple stent placements (11/2019), h/o CVA, emphysema, hypertension, hyperlipidemia, h/o TBI with residual memory deficit, seizures, chronic hepatitis C, bipolar disorder, h/o substance abuse, PTSD and tobacco use who presents to the hospital with increasing shortness of breath noted to have palpable airspace disease.  Has had intermittent atrial fibrillation. Tested negative for covid on admission.   1.  A. fib-patient has a history of second-degree heart block status post permanent pacemaker.  Review of records suggest that she had preserved LV function by echo in the past as well as a negative functional study and normal LV function by functional study.  Etiology of her A. fib is likely secondary to her pneumonia.  CHA2DS2-VASc score is 4.  We will continue with IV amiodarone for now until her  pulmonary status stabilizes.  Will defer chronic anticoagulation at present as this appears to be transient A. fib although this will need to be addressed prior to discharge. Given pulmonary function and age, amiodarone is not a great long term solution for her afib. Will need to address this prior to discharge.   2.  Second-degree heart block-status post permanent pacemaker in November 2010.  3.  Peripheral vascular disease-status post percutaneous intervention in the right common iliac artery, left common iliac artery and left external iliac artery and February of this year.  On 81 mg of aspirin and clopidogrel at 75 mg daily.  We will continue with this.  4.  Hypertension-patient on 10 mg of amlodipine and losartan 50 mg daily as an outpatient.  We will continue to follow her pressure and titrate her medications back to her home protocol as pressure tolerates.  5.  Tobacco abuse-continues to smoke cigarettes.  6.  Bipolar disorder-on lithium carbonate at 300 mg daily   7.  Pneumonia-empirically on Rocephin.  Pulmonary status is worsening somewhat. CXR and oxygen deman worsening.   8. CKD-renal function continues to slowly improve.    Signed, Javier Docker Madhav Mohon MD 03/23/2020, 6:47 AM  Pager: [phone]

## 2020-03-23 NOTE — Progress Notes (Signed)
*  PRELIMINARY RESULTS* Echocardiogram 2D Echocardiogram has been performed.  Cynthia [name] 03/23/2020, 8:45 PM

## 2020-03-24 ENCOUNTER — Inpatient Hospital Stay: Payer: Medicare Other

## 2020-03-24 DIAGNOSIS — J441 Chronic obstructive pulmonary disease with (acute) exacerbation: Secondary | ICD-10-CM | POA: Diagnosis not present

## 2020-03-24 DIAGNOSIS — J9601 Acute respiratory failure with hypoxia: Secondary | ICD-10-CM | POA: Diagnosis not present

## 2020-03-24 DIAGNOSIS — J189 Pneumonia, unspecified organism: Secondary | ICD-10-CM | POA: Diagnosis not present

## 2020-03-24 LAB — BLOOD GAS, ARTERIAL
Acid-base deficit: 2 mmol/L (ref 0.0–2.0)
Bicarbonate: 23.7 mmol/L (ref 20.0–28.0)
FIO2: 0.5
O2 Saturation: 90.5 %
Patient temperature: 37
pCO2 arterial: 43 mmHg (ref 32.0–48.0)
pH, Arterial: 7.35 (ref 7.350–7.450)
pO2, Arterial: 63 mmHg — ABNORMAL LOW (ref 83.0–108.0)

## 2020-03-24 LAB — BASIC METABOLIC PANEL
Anion gap: 8 (ref 5–15)
BUN: 44 mg/dL — ABNORMAL HIGH (ref 8–23)
CO2: 23 mmol/L (ref 22–32)
Calcium: 9.7 mg/dL (ref 8.9–10.3)
Chloride: 111 mmol/L (ref 98–111)
Creatinine, Ser: 1.2 mg/dL — ABNORMAL HIGH (ref 0.44–1.00)
GFR calc Af Amer: 55 mL/min — ABNORMAL LOW (ref >60)
GFR calc non Af Amer: 47 mL/min — ABNORMAL LOW (ref >60)
Glucose, Bld: 139 mg/dL — ABNORMAL HIGH (ref 70–99)
Potassium: 4.6 mmol/L (ref 3.5–5.1)
Sodium: 142 mmol/L (ref 135–145)

## 2020-03-24 LAB — CBC WITH DIFFERENTIAL/PLATELET
Abs Immature Granulocytes: 0.66 10*3/uL — ABNORMAL HIGH (ref 0.00–0.07)
Basophils Absolute: 0 10*3/uL (ref 0.0–0.1)
Basophils Relative: 0 %
Eosinophils Absolute: 0 10*3/uL (ref 0.0–0.5)
Eosinophils Relative: 0 %
HCT: 39.7 % (ref 36.0–46.0)
Hemoglobin: 12.9 g/dL (ref 12.0–15.0)
Immature Granulocytes: 5 %
Lymphocytes Relative: 4 %
Lymphs Abs: 0.5 10*3/uL — ABNORMAL LOW (ref 0.7–4.0)
MCH: 30.3 pg (ref 26.0–34.0)
MCHC: 32.5 g/dL (ref 30.0–36.0)
MCV: 93.2 fL (ref 80.0–100.0)
Monocytes Absolute: 0.5 10*3/uL (ref 0.1–1.0)
Monocytes Relative: 4 %
Neutro Abs: 11.3 10*3/uL — ABNORMAL HIGH (ref 1.7–7.7)
Neutrophils Relative %: 87 %
Platelets: 125 10*3/uL — ABNORMAL LOW (ref 150–400)
RBC: 4.26 MIL/uL (ref 3.87–5.11)
RDW: 15.9 % — ABNORMAL HIGH (ref 11.5–15.5)
Smear Review: NORMAL
WBC: 12.9 10*3/uL — ABNORMAL HIGH (ref 4.0–10.5)
nRBC: 0 % (ref 0.0–0.2)

## 2020-03-24 LAB — GLUCOSE, CAPILLARY
Glucose-Capillary: 214 mg/dL — ABNORMAL HIGH (ref 70–99)
Glucose-Capillary: 222 mg/dL — ABNORMAL HIGH (ref 70–99)

## 2020-03-24 LAB — PHOSPHORUS: Phosphorus: 4 mg/dL (ref 2.5–4.6)

## 2020-03-24 LAB — ECHOCARDIOGRAM COMPLETE
Height: 66 in
Weight: 2264.57 oz

## 2020-03-24 LAB — MAGNESIUM: Magnesium: 2.4 mg/dL (ref 1.7–2.4)

## 2020-03-24 MED ORDER — FENTANYL CITRATE (PF) 100 MCG/2ML IJ SOLN
INTRAMUSCULAR | Status: AC
  Administered 2020-03-24: 100 ug
  Filled 2020-03-24: qty 2

## 2020-03-24 MED ORDER — MIDAZOLAM HCL 2 MG/2ML IJ SOLN
INTRAMUSCULAR | Status: AC
  Administered 2020-03-24: 4 mg
  Filled 2020-03-24: qty 4

## 2020-03-24 MED ORDER — CHLORHEXIDINE GLUCONATE 0.12 % MT SOLN
15.0000 mL | Freq: Two times a day (BID) | OROMUCOSAL | Status: DC
  Administered 2020-03-24 – 2020-04-07 (×27): 15 mL via OROMUCOSAL
  Filled 2020-03-24 (×2): qty 15

## 2020-03-24 MED ORDER — VITAL HIGH PROTEIN PO LIQD: 1000.0000 mL | ORAL | Status: DC

## 2020-03-24 MED ORDER — VECURONIUM BROMIDE 10 MG IV SOLR
INTRAVENOUS | Status: AC
  Administered 2020-03-24: 10 mg
  Filled 2020-03-24: qty 10

## 2020-03-24 MED ORDER — INSULIN ASPART 100 UNIT/ML ~~LOC~~ SOLN
0.0000 [IU] | SUBCUTANEOUS | Status: DC
  Administered 2020-03-24 (×2): 3 [IU] via SUBCUTANEOUS
  Administered 2020-03-25: 7 [IU] via SUBCUTANEOUS
  Administered 2020-03-25: 3 [IU] via SUBCUTANEOUS
  Administered 2020-03-25 (×2): 2 [IU] via SUBCUTANEOUS
  Administered 2020-03-25: 5 [IU] via SUBCUTANEOUS
  Administered 2020-03-26: 9 [IU] via SUBCUTANEOUS
  Administered 2020-03-26: 7 [IU] via SUBCUTANEOUS
  Administered 2020-03-26: 2 [IU] via SUBCUTANEOUS
  Administered 2020-03-26: 5 [IU] via SUBCUTANEOUS
  Administered 2020-03-26: 2 [IU] via SUBCUTANEOUS
  Administered 2020-03-27: 1 [IU] via SUBCUTANEOUS
  Administered 2020-03-27: 3 [IU] via SUBCUTANEOUS
  Administered 2020-03-27: 2 [IU] via SUBCUTANEOUS
  Administered 2020-03-27: 3 [IU] via SUBCUTANEOUS
  Administered 2020-03-27 – 2020-03-28 (×3): 2 [IU] via SUBCUTANEOUS
  Administered 2020-03-28: 3 [IU] via SUBCUTANEOUS
  Administered 2020-03-28: 2 [IU] via SUBCUTANEOUS
  Administered 2020-03-28 – 2020-03-29 (×5): 3 [IU] via SUBCUTANEOUS
  Filled 2020-03-24 (×26): qty 1

## 2020-03-24 MED ORDER — VITAL AF 1.2 CAL PO LIQD
1000.0000 mL | ORAL | Status: DC
  Administered 2020-03-24 – 2020-03-31 (×7): 1000 mL

## 2020-03-24 MED ORDER — LABETALOL HCL 5 MG/ML IV SOLN: 20.0000 mg | Freq: Once | INTRAVENOUS | Status: AC

## 2020-03-24 MED ORDER — ORAL CARE MOUTH RINSE
15.0000 mL | OROMUCOSAL | Status: DC
  Administered 2020-03-24 – 2020-04-01 (×79): 15 mL via OROMUCOSAL

## 2020-03-24 MED ORDER — FENTANYL 2500MCG IN NS 250ML (10MCG/ML) PREMIX INFUSION
0.0000 ug/h | INTRAVENOUS | Status: DC
  Administered 2020-03-25: 50 ug/h via INTRAVENOUS
  Filled 2020-03-24: qty 250

## 2020-03-24 MED ORDER — LABETALOL HCL 5 MG/ML IV SOLN
INTRAVENOUS | Status: AC
  Administered 2020-03-24: 20 mg via INTRAVENOUS
  Filled 2020-03-24: qty 4

## 2020-03-24 MED ORDER — FENTANYL 2500MCG IN NS 250ML (10MCG/ML) PREMIX INFUSION
INTRAVENOUS | Status: AC
  Administered 2020-03-24: 25 ug/h via INTRAVENOUS
  Filled 2020-03-24: qty 250

## 2020-03-24 MED ORDER — STERILE WATER FOR INJECTION IJ SOLN
INTRAMUSCULAR | Status: AC
  Filled 2020-03-24: qty 10

## 2020-03-24 NOTE — Progress Notes (Signed)
Nutrition Follow-up  DOCUMENTATION CODES:   Not applicable  INTERVENTION:  Initiate Vital AF 1.2 Cal at 15 mL/hr and advance by 20 mL/hr every 8 hours to goal rate of 55 mL/hr (1320 mL goal daily volume). Provides 1584 kcal, 99 grams of protein, 1069 mL H2O daily.  NUTRITION DIAGNOSIS:   Inadequate oral intake related to inability to eat as evidenced by NPO status.  New nutrition diagnosis.  GOAL:   Patient will meet greater than or equal to 90% of their needs  Progressing with initiation of tube feeds.  MONITOR:   Vent status, Labs, Weight trends, TF tolerance, I & O's  REASON FOR ASSESSMENT:   Ventilator, Consult Assessment of nutrition requirement/status, Enteral/tube feeding initiation and management  ASSESSMENT:   64 year old female with PMHx of bipolar disorder, TBI, HTN, CAD, PVD, COPD, seizure disorder admitted with acute exacerbation of COPD, multifocal PNA, A-fib with RVR.   Patient was intubated this AM. Patient's husband and sister-in-law were at bedside at time of RD assessment. Husband reports patient had a good appetite and intake PTA and was weight-stable.  Patient is currently intubated on ventilator support MV: 6.5 L/min Temp (24hrs), Avg:98.3 F (36.8 C), Min:98.1 F (36.7 C), Max:98.6 F (37 C)  Medications reviewed and include: azithromycin, folic acid 1 mg daily, Solu-medrol 20 mg Q12hrs IV, MVI daily, Miralax, senna 1 tablet QHS, thiamine 100 mg daily, ceftriaxone, Precedex gtt, fentanyl gtt.  Labs reviewed: BUN 44, Creatinine 1.2.  Enteral Access: OGT; terminates in stomach per chest x-ray today  I/O: 1000 mL UOP yesterday (0.6 mL/kg/hr)  Discussed with RN and on rounds. Plan is to start tube feeds today.  NUTRITION - FOCUSED PHYSICAL EXAM:    Most Recent Value  Orbital Region No depletion  Upper Arm Region No depletion  Thoracic and Lumbar Region No depletion  Buccal Region Unable to assess  Temple Region Mild depletion  Clavicle  Bone Region No depletion  Clavicle and Acromion Bone Region No depletion  Scapular Bone Region No depletion  Dorsal Hand No depletion  Patellar Region Mild depletion  Anterior Thigh Region Mild depletion  Posterior Calf Region Mild depletion  Edema (RD Assessment) None  Hair Reviewed  Eyes Unable to assess  Mouth Unable to assess  Skin Reviewed  Nails Reviewed     Diet Order:   Diet Order            Diet NPO time specified  Diet effective now                EDUCATION NEEDS:   No education needs have been identified at this time  Skin:  Skin Assessment: Reviewed RN Assessment  Last BM:  03/24/2020 - type 6  Height:   Ht Readings from Last 1 Encounters:  03/21/20 5\' 6"  (1.676 m)   Weight:   Wt Readings from Last 1 Encounters:  03/24/20 64.3 kg   BMI:  Body mass index is 22.88 kg/m.  Estimated Nutritional Needs:   Kcal:  1600-1800  Protein:  95-105 grams  Fluid:  >/= 1.9 L/day  Jacklynn Barnacle, MS, RD, LDN Pager number available on Amion

## 2020-03-24 NOTE — Progress Notes (Signed)
Patient Name: Cynthia [name] Date of Encounter: 03/24/2020  Hospital Problem List     Principal Problem:   CAP (community acquired pneumonia) Active Problems:   Essential hypertension, benign   Traumatic brain injury ([place])   Seizures ([place])   Bipolar affective disorder ([place])   Artificial cardiac pacemaker   COPD with acute exacerbation ([place])   AKI (acute kidney injury) ([place])   Acute respiratory failure ([place])    Patient Profile        64 y.o.femalewith history ofCAD, second-degree heart block s/p PPM placement, PVD s/p multiple stent placements (11/2019), h/o CVA, emphysema, hypertension, hyperlipidemia, h/o TBI with residual memory deficit, seizures, chronic hepatitis C, bipolar disorder, h/o substance abuse, PTSD and tobacco use whowas admitted withincreasing shortness of breath and was diagnosed with mild to moderate to bibasilar atelectasis or infiltrate with a small left pleural effusion. She was noted to be in atrial fibrillation with rapid ventricular response on admission. Initially responded to IV metoprolol however had recurrence and was placed on IV amiodarone. Since IV amiodarone has been started, it does not appear she has had any significant atrial arrhythmia previously although is a difficult historian. Her respiratory status required intubation. Currently intubated and sedated. Hemodynamically stable at present.  Subjective   Intubated   Inpatient Medications    . amantadine  100 mg Oral Daily  . amLODipine  5 mg Oral Daily  . azithromycin  500 mg Oral Daily  . budesonide (PULMICORT) nebulizer solution  0.5 mg Nebulization BID  . chlorhexidine gluconate (MEDLINE KIT)  15 mL Mouth Rinse BID  . Chlorhexidine Gluconate Cloth  6 each Topical Daily  . clopidogrel  75 mg Oral Daily  . feeding supplement (ENSURE ENLIVE)  237 mL Oral BID BM  . folic acid  1 mg Oral Daily  . gabapentin  300 mg Oral Daily  . heparin  5,000 Units Subcutaneous Q8H  .  ipratropium-albuterol  3 mL Nebulization Q4H  . lithium carbonate  300 mg Oral BID WC  . LORazepam  0-4 mg Intravenous Q6H   Followed by  . [START ON 03/25/2020] LORazepam  0-4 mg Intravenous Q12H  . mouth rinse  15 mL Mouth Rinse 10 times per day  . methylPREDNISolone (SOLU-MEDROL) injection  20 mg Intravenous Q12H  . multivitamin with minerals  1 tablet Oral Daily  . Oxcarbazepine  300 mg Oral QHS  . polyethylene glycol  17 g Oral Daily  . QUEtiapine  100 mg Oral QHS  . QUEtiapine  50 mg Oral QPC breakfast  . senna  1 tablet Oral QHS  . sodium chloride flush  10-40 mL Intracatheter Q12H  . thiamine  100 mg Oral Daily   Or  . thiamine  100 mg Intravenous Daily    Vital Signs    Vitals:   03/24/20 0400 03/24/20 0403 03/24/20 0500 03/24/20 0600  BP: (!) 118/56  (!) 143/58 (!) 127/56  Pulse: (!) 59  63 (!) 59  Resp: (!) 28  (!) 32 (!) 28  Temp: 98.3 F (36.8 C)     TempSrc:      SpO2: 97% 98% 98% 99%  Weight: 64.3 kg     Height:        Intake/Output Summary (Last 24 hours) at 03/24/2020 0901 Last data filed at 03/24/2020 0644 Gross per 24 hour  Intake 684.76 ml  Output 1000 ml  Net -315.24 ml   Filed Weights   03/21/20 0211 03/22/20 0600 03/24/20 0400  Weight: 65.3 kg  64.2 kg 64.3 kg    Physical Exam    GEN: intubated and sedated.  HEENT: normal.  Neck: Supple, no JVD, carotid bruits, or masses. Cardiac: RRR, no murmurs, rubs, or gallops. No clubbing, cyanosis, edema.  Radials/DP/PT 2+ and equal bilaterally.  Respiratory:  Respirations regular . Breathing with vent GI: Soft, nontender, nondistended, BS + x 4. MS: no deformity or atrophy. Skin: warm and dry, no rash. Neuro:  Intubated and sedated  Labs    CBC Recent Labs    03/23/20 1027 03/24/20 0556  WBC 9.0 12.9*  NEUTROABS  --  11.3*  HGB 12.7 12.9  HCT 39.7 39.7  MCV 93.9 93.2  PLT 122* 283*   Basic Metabolic Panel Recent Labs    03/23/20 1027 03/24/20 0556  NA 139 142  K 3.6 4.6  CL 107  111  CO2 20* 23  GLUCOSE 245* 139*  BUN 41* 44*  CREATININE 1.40* 1.20*  CALCIUM 10.2 9.7  MG 2.0 2.4  PHOS 3.5 4.0   Liver Function Tests Recent Labs    03/23/20 1027  AST 50*  ALT 34  ALKPHOS 71  BILITOT 0.5  PROT 6.9  ALBUMIN 2.6*   No results for input(s): LIPASE, AMYLASE in the last 72 hours. Cardiac Enzymes No results for input(s): CKTOTAL, CKMB, CKMBINDEX, TROPONINI in the last 72 hours. BNP No results for input(s): BNP in the last 72 hours. D-Dimer No results for input(s): DDIMER in the last 72 hours. Hemoglobin A1C No results for input(s): HGBA1C in the last 72 hours. Fasting Lipid Panel No results for input(s): CHOL, HDL, LDLCALC, TRIG, CHOLHDL, LDLDIRECT in the last 72 hours. Thyroid Function Tests No results for input(s): TSH, T4TOTAL, T3FREE, THYROIDAB in the last 72 hours.  Invalid input(s): FREET3  Telemetry    nsr   ECG    nsr with no ischemia  Radiology    CT ABDOMEN PELVIS WO CONTRAST  Result Date: 03/20/2020 CLINICAL DATA:  Periumbilical abdominal pain. EXAM: CT ABDOMEN AND PELVIS WITHOUT CONTRAST TECHNIQUE: Multidetector CT imaging of the abdomen and pelvis was performed following the standard protocol without IV contrast. COMPARISON:  None. FINDINGS: Lower chest: Marked severity infiltrates are seen within the posterior aspect of the bilateral lower lobes, left greater than right. Chronic posterior right ninth and tenth rib fractures are seen. Hepatobiliary: No focal liver abnormality is seen. The gallbladder is markedly distended without evidence of gallstones, gallbladder wall thickening, or biliary dilatation. Pancreas: Unremarkable. No pancreatic ductal dilatation or surrounding inflammatory changes. Spleen: Normal in size without focal abnormality. Adrenals/Urinary Tract: 3.1 cm x 1.5 cm and 1.5 cm x 1.2 cm low-attenuation left adrenal masses are seen. Kidneys are normal, without renal calculi, focal lesion, or hydronephrosis. Bladder is  unremarkable. Stomach/Bowel: Stomach is within normal limits. Appendix appears normal. No evidence of bowel wall thickening, distention, or inflammatory changes. Vascular/Lymphatic: There is marked severity calcification of the abdominal aorta. Bilateral common iliac artery and bilateral external iliac artery stents are seen. No enlarged abdominal or pelvic lymph nodes. Reproductive: Uterus and bilateral adnexa are unremarkable. Other: No abdominal wall hernia or abnormality. No abdominopelvic ascites. Musculoskeletal: Marked severity degenerative changes are seen at the level of L5-S1. IMPRESSION: 1. Marked severity bilateral lower lobe infiltrates, left greater than right. 2. Low-attenuation left adrenal masses which may represent adrenal adenomas. 3. Marked severity calcification of the abdominal aorta. 4. Bilateral common iliac artery and bilateral external iliac artery stents. 5. Marked severity degenerative changes at the level of L5-S1. Aortic  Atherosclerosis (ICD10-I70.0). Electronically Signed   By: Virgina Norfolk M.D.   On: 03/20/2020 20:52   DG Chest 1 View  Result Date: 03/21/2020 CLINICAL DATA:  Abdominal distension EXAM: CHEST  1 VIEW COMPARISON:  Radiograph 03/20/2020, CT abdomen pelvis 03/20/2020, CT chest 11/25/2019 FINDINGS: Dual lead pacer pack overlies left chest wall with leads in stable position at the right atrium and cardiac apex. Telemetry leads and nasal cannula overlie the chest. Persistent bibasilar consolidative opacities. Suspect a trace left effusion. Some fissural and septal thickening is noted with increasing central venous congestion and central cuffing. Could reflect superimposed edema. Remote posttraumatic deformity of the distal right clavicle. No acute osseous or soft tissue abnormality. The aorta is calcified. The remaining cardiomediastinal contours are unremarkable. IMPRESSION: 1. Persistent bibasilar consolidative opacities. 2. Increasing central venous congestion,  cuffing and septal thickening. Likely developing interstitial edema. Electronically Signed   By: Lovena Le M.D.   On: 03/21/2020 02:15   DG Chest 2 View  Result Date: 03/20/2020 CLINICAL DATA:  Shortness of breath. EXAM: CHEST - 2 VIEW COMPARISON:  July 11, 2017 FINDINGS: There is a dual lead AICD. Mild to moderate severity areas of atelectasis and/or infiltrate are seen within the bilateral lung bases. There is a small left pleural effusion. No pneumothorax is identified. The heart size and mediastinal contours are within normal limits. The visualized skeletal structures are unremarkable. IMPRESSION: 1. Mild to moderate severity bibasilar atelectasis and/or infiltrate. 2. Small left pleural effusion. Electronically Signed   By: Virgina Norfolk M.D.   On: 03/20/2020 20:53   DG Abd 1 View  Result Date: 03/21/2020 CLINICAL DATA:  Abdominal distension EXAM: ABDOMEN - 1 VIEW COMPARISON:  CT abdomen pelvis 03/20/2020 FINDINGS: Persistent consolidative opacities in the lung bases. Trace left effusion. Cardiomediastinal contours are stable from prior. No high-grade obstructive bowel gas pattern is seen in the abdomen or pelvis. Moderate colonic stool burden. Air and stool projects over the rectal vault. Multiple vascular calcifications noted throughout the upper abdomen including over the renal pelves compatible with findings on CT. No suspicious calcifications. Vascular stenting of the iliac arteries. Multilevel degenerative changes in the spine, hips and pelvis. External rotation of the right hip limits evaluation of the femoral neck. If there is concern for femoral abnormality, recommend dedicated imaging. IMPRESSION: 1. No high-grade obstructive bowel gas pattern in the abdomen or pelvis. 2. Moderate colonic stool burden. 3. Persistent consolidative opacities in the lung bases. Trace left effusion. Electronically Signed   By: Lovena Le M.D.   On: 03/21/2020 02:12   DG Chest Port 1 View  Result  Date: 03/24/2020 CLINICAL DATA:  Respiratory distress EXAM: PORTABLE CHEST 1 VIEW COMPARISON:  None. FINDINGS: Endotracheal tube essentially at the carina. Recommend retraction by 3 to 4 cm. NG tube in stomach. Normal cardiac silhouette. Diffuse airspace disease slightly increased in density. Small LEFT effusion unchanged IMPRESSION: 1. Endotracheal tube at carina.  Consider retraction by 3-4 cm. 2. Diffuse bilateral airspace disease slightly worse. These results will be called to the ordering clinician or representative by the Radiologist Assistant, and communication documented in the PACS or [employer]. Electronically Signed   By: Suzy Bouchard M.D.   On: 03/24/2020 08:58   DG Chest Port 1 View  Result Date: 03/22/2020 CLINICAL DATA:  Acute respiratory failure EXAM: PORTABLE CHEST 1 VIEW COMPARISON:  March 21, 2020 FINDINGS: Stable pacemaker. The cardiomediastinal silhouette is stable. No pneumothorax. Bilateral patchy pulmonary infiltrates have worsened in the interval. IMPRESSION: Increasing bilateral  pulmonary infiltrates may represent diffuse multifocal pneumonia versus developing asymmetric edema. Recommend clinical correlation and short-term follow-up to ensure resolution. Electronically Signed   By: Dorise Bullion III M.D   On: 03/22/2020 07:58   ECHOCARDIOGRAM COMPLETE  Result Date: 03/24/2020    ECHOCARDIOGRAM REPORT   Patient Name:   SIRENA RIDDLE Date of Exam: 03/23/2020 Medical Rec #:  [id number]      Height:       66.0 in Accession #:    [id number]     Weight:       141.5 lb Date of Birth:  April 05, 1956      BSA:          1.727 m Patient Age:    64 years       BP:           148/62 mmHg Patient Gender: F              HR:           82 bpm. Exam Location:  [facility] Procedure: 2D Echo, Cardiac Doppler and Color Doppler Indications:     Acute Respiratory Insufficiency 518.82 / R06.89  History:         Patient has prior history of Echocardiogram examinations. Risk                   Factors:Hypertension. 2nd degree heart block.  Sonographer:     Alyse Low Roar Referring Phys:  [id number] Flora Lipps Diagnosing Phys: Isaias Cowman MD IMPRESSIONS  1. Left ventricular ejection fraction, by estimation, is 50 to 55%. The left ventricle has low normal function. The left ventricle has no regional wall motion abnormalities. There is mild left ventricular hypertrophy. Left ventricular diastolic parameters were normal.  2. Right ventricular systolic function is normal. The right ventricular size is normal. There is moderately elevated pulmonary artery systolic pressure.  3. The mitral valve is normal in structure. Mild mitral valve regurgitation. No evidence of mitral stenosis.  4. The aortic valve is normal in structure. Aortic valve regurgitation is mild to moderate. No aortic stenosis is present.  5. The inferior vena cava is normal in size with greater than 50% respiratory variability, suggesting right atrial pressure of 3 mmHg. FINDINGS  Left Ventricle: Left ventricular ejection fraction, by estimation, is 50 to 55%. The left ventricle has low normal function. The left ventricle has no regional wall motion abnormalities. The left ventricular internal cavity size was normal in size. There is mild left ventricular hypertrophy. Left ventricular diastolic parameters were normal. Right Ventricle: The right ventricular size is normal. No increase in right ventricular wall thickness. Right ventricular systolic function is normal. There is moderately elevated pulmonary artery systolic pressure. The tricuspid regurgitant velocity is 3.07 m/s, and with an assumed right atrial pressure of 10 mmHg, the estimated right ventricular systolic pressure is 59.5 mmHg. Left Atrium: Left atrial size was normal in size. Right Atrium: Right atrial size was normal in size. Pericardium: There is no evidence of pericardial effusion. Mitral Valve: The mitral valve is normal in structure. Normal mobility of the mitral valve  leaflets. Mild mitral valve regurgitation. No evidence of mitral valve stenosis. Tricuspid Valve: The tricuspid valve is normal in structure. Tricuspid valve regurgitation is mild . No evidence of tricuspid stenosis. Aortic Valve: The aortic valve is normal in structure. Aortic valve regurgitation is mild to moderate. Aortic regurgitation PHT measures 410 msec. No aortic stenosis is present. Aortic valve mean gradient measures 6.0 mmHg. Aortic valve peak  gradient measures 13.8 mmHg. Aortic valve area, by VTI measures 1.95 cm. Pulmonic Valve: The pulmonic valve was normal in structure. Pulmonic valve regurgitation is not visualized. No evidence of pulmonic stenosis. Aorta: The aortic root is normal in size and structure. Venous: The inferior vena cava is normal in size with greater than 50% respiratory variability, suggesting right atrial pressure of 3 mmHg. IAS/Shunts: No atrial level shunt detected by color flow Doppler.  LEFT VENTRICLE PLAX 2D LVIDd:         4.40 cm  Diastology LVIDs:         3.13 cm  LV e' lateral:   13.90 cm/s LV PW:         1.21 cm  LV E/e' lateral: 5.9 LV IVS:        1.30 cm  LV e' medial:    10.60 cm/s LVOT diam:     1.70 cm  LV E/e' medial:  7.8 LV SV:         64 LV SV Index:   37 LVOT Area:     2.27 cm  RIGHT VENTRICLE RV Mid diam:    3.04 cm RV S prime:     15.40 cm/s LEFT ATRIUM             Index       RIGHT ATRIUM           Index LA diam:        3.55 cm 2.06 cm/m  RA Area:     14.50 cm LA Vol (A2C):   45.5 ml 26.35 ml/m RA Volume:   35.40 ml  20.50 ml/m LA Vol (A4C):   40.9 ml 23.69 ml/m LA Biplane Vol: 44.2 ml 25.60 ml/m  AORTIC VALVE                    PULMONIC VALVE AV Area (Vmax):    1.93 cm     PV Vmax:        1.18 m/s AV Area (Vmean):   1.88 cm     PV Peak grad:   5.6 mmHg AV Area (VTI):     1.95 cm     RVOT Peak grad: 2 mmHg AV Vmax:           186.00 cm/s AV Vmean:          109.000 cm/s AV VTI:            0.330 m AV Peak Grad:      13.8 mmHg AV Mean Grad:      6.0 mmHg  LVOT Vmax:         158.00 cm/s LVOT Vmean:        90.200 cm/s LVOT VTI:          0.283 m LVOT/AV VTI ratio: 0.86 AI PHT:            410 msec  AORTA Ao Root diam: 2.50 cm MITRAL VALVE               TRICUSPID VALVE MV Area (PHT): 3.23 cm    TR Peak grad:   37.7 mmHg MV Decel Time: 235 msec    TR Vmax:        307.00 cm/s MV E velocity: 82.60 cm/s MV A velocity: 70.00 cm/s  SHUNTS MV E/A ratio:  1.18        Systemic VTI:  0.28 m MV A Prime:    10.2 cm/s   Systemic Diam: 1.70 cm  Isaias Cowman MD Electronically signed by Isaias Cowman MD Signature Date/Time: 03/24/2020/7:47:47 AM    Final    [place] EKG SITE RITE  Result Date: 03/23/2020 If Site Rite image not attached, placement could not be confirmed due to current cardiac rhythm.   Assessment & Plan    64 y.o.female patient with PMH significant for CAD, second-degree heart block s/p PPM placement, PVD s/p multiple stent placements (11/2019), h/o CVA, emphysema, hypertension, hyperlipidemia, h/o TBI with residual memory deficit, seizures, chronic hepatitis C, bipolar disorder, h/o substance abuse, PTSD and tobacco use who presentsto the hospital with increasing shortness of breath noted to have palpable airspace disease. Has had intermittent atrial fibrillation. Tested negative for covid on admission. Currently intubated  1. A. fib-patient has a history of second-degree heart block status post permanent pacemaker. Review of records suggest that she had preserved LV function by echo in the past as well as a negative functional study and normal LV function by functional study. Etiology of her A. fib is likely secondary to her pneumonia. CHA2DS2-VASc score is 4. We will continue with IV amiodarone for now until taking po. Will defer chronic anticoagulation at present as this appears to be transient A. fib although this will need to be addressed prior to discharge. Given pulmonary function and age, amiodarone is not a great long term solution for  her afib. Will need to address this prior to discharge.   2. Second-degree heart block-status post permanent pacemaker in November 2010. Pacer sensing appropriately  3. Peripheral vascular disease-status post percutaneous intervention in the right common iliac artery, left common iliac artery and left external iliac artery and February of this year. On 81 mg of aspirin and clopidogrel at 75 mg daily. We will continue with this when taking po.  4. Hypertension-patient on 10 mg of amlodipine and losartan 50 mg daily as an outpatient. We will continue to follow her pressure and titrate her medications back to her home protocol as pressure tolerates.  5. Tobacco abuse-continues to smoke cigarettes.  6. Bipolar disorder-on lithium carbonate at 300 mg daily   7. Pneumonia-inbutated and on abx.   Signed, Javier Docker Tovah Slavick MD 03/24/2020, 9:01 AM  Pager: [phone]

## 2020-03-24 NOTE — Progress Notes (Signed)
PT Cancellation Note  Patient Details Name: Cynthia [name] MRN: [id number] DOB: 1956/07/15   Cancelled Treatment:    Reason Eval/Treat Not Completed: Medical issues which prohibited therapy;Patient not medically ready (Pt with further decline in respiratory status, now requiring intubation. Will sign off. Please reconsult once medically ready to participate.)  8:39 AM, 03/24/20 Etta Grandchild, PT, DPT Physical Therapist - [facility]  [phone] ([place])    [place] C 03/24/2020, 8:39 AM

## 2020-03-24 NOTE — Progress Notes (Signed)
OT Cancellation Note  Patient Details Name: Cynthia [name] MRN: [id number] DOB: 06-Mar-1956   Cancelled Treatment:    Reason Eval/Treat Not Completed: Patient not medically ready. Per chart review, pt intubated this date. Will sign off at this time. Please re-consult when pt medically stable and appropriate for physical activity.   [name], M.S. OTR/L  03/24/20, 8:38 AM

## 2020-03-24 NOTE — Progress Notes (Signed)
Chaplain met patient's family offered prayer emotional support.

## 2020-03-24 NOTE — Progress Notes (Signed)
SLP Cancellation Note  Patient Details Name: Makaley Storts MRN: [id number] DOB: [birth year]   Cancelled treatment:       Reason Eval/Treat Not Completed: Medical issues which prohibited therapy   Per chart, pt is at very high risk of intubation and cardiac arrest. Please re-consult when pt becomes medically stable and appropriate for PO intake.   Abimbola Aki B. Rutherford Nail M.S., CCC-SLP, [place] Office [phone]    Stormy Fabian 03/24/2020, 7:43 AM

## 2020-03-24 NOTE — Procedures (Signed)
Endotracheal Intubation: Patient required placement of an artificial airway secondary to Respiratory Failure  Consent: Emergent.   Hand washing performed prior to starting the procedure.   Medications administered for sedation prior to procedure:  Midazolam 4 mg IV,  Vecuronium 10 mg IV, Fentanyl 100 mcg IV.    A time out procedure was called and correct patient, name, & ID confirmed. Needed supplies and equipment were assembled and checked to include ETT, 10 ml syringe, Glidescope, Mac and Miller blades, suction, oxygen and bag mask valve, end tidal CO2 monitor.   Patient was positioned to align the mouth and pharynx to facilitate visualization of the glottis.   Heart rate, SpO2 and blood pressure was continuously monitored during the procedure. Pre-oxygenation was conducted prior to intubation and endotracheal tube was placed through the vocal cords into the trachea.     The artificial airway was placed under direct visualization via glidescope route using a 7.5 ETT on the first attempt.  ETT was secured at 23 cm mark.  Placement was confirmed by auscuitation of lungs with good breath sounds bilaterally and no stomach sounds.  Condensation was noted on endotracheal tube.   Pulse ox 98%.  CO2 detector in place with appropriate color change.   Complications: None .   Operator: Vanita Cannell.   Chest radiograph ordered and pending.   Comments: OGT placed via glidescope.  Yahayra Geis David Seyon Strader, M.D.  [facility]  Medical Director ICU-[facility] [place] Medical Director [facility] Cardio-Pulmonary Department       

## 2020-03-24 NOTE — Progress Notes (Signed)
CRITICAL CARE NOTE Admitted for COPD exacerbation and Multifocal pneumonia complciated by afib with RVR  6/25 admitted for COPD exacerbation pneumonia 6/26 transferred to ICU for afib with RVR 6/27 required biPAP, high flow [place] 6/28 transferred to [facility] service for increased WOB and agitation with biPAP and high flow [place] 6/28 PICC line placed, started precedex high folow [place] 50%/50L 6/29 high risk for intubation, severe resp failure   CC  follow up respiratory failure  SUBJECTIVE Patient remains critically ill Prognosis is guarded   BP (!) 127/56   Pulse (!) 59   Temp 98.3 F (36.8 C)   Resp (!) 28   Ht _0  (1.676 m)   Wt 64.3 kg   SpO2 99%   BMI 22.88 kg/m    I/O last 3 completed shifts: In: 884.7 [I.V.:784.7; IV Piggyback:100] Out: 1400 [Urine:1400] No intake/output data recorded.  SpO2: 99 % O2 Flow Rate (L/min): 50 L/min FiO2 (%): 50 %  Estimated body mass index is 22.88 kg/m as calculated from the following:   Height as of this encounter: _1  (1.676 m).   Weight as of this encounter: 64.3 kg.  SIGNIFICANT EVENTS   REVIEW OF SYSTEMS  PATIENT IS UNABLE TO PROVIDE COMPLETE REVIEW OF SYSTEMS DUE TO SEVERE CRITICAL ILLNESS        PHYSICAL EXAMINATION:  GENERAL:critically ill appearing, +resp distress HEAD: Normocephalic, atraumatic.  EYES: Pupils equal, round, reactive to light.  No scleral icterus.  MOUTH: Moist mucosal membrane. NECK: Supple.  PULMONARY: +rhonchi, +wheezing CARDIOVASCULAR: S1 and S2. Regular rate and rhythm. No murmurs, rubs, or gallops.  GASTROINTESTINAL: Soft, nontender, -distended.  Positive bowel sounds.   MUSCULOSKELETAL: No swelling, clubbing, or edema.  NEUROLOGIC: obtunded, GCS<8 SKIN:intact,warm,dry  MEDICATIONS: I have reviewed all medications and confirmed regimen as documented   CULTURE RESULTS   Recent Results (from the past 240 hour(s))  SARS Coronavirus 2 by RT PCR (hospital order, performed in [facility]  hospital lab) Nasopharyngeal Nasopharyngeal Swab     Status: None   Collection Time: 03/20/20  8:19 PM   Specimen: Nasopharyngeal Swab  Result Value Ref Range Status   SARS Coronavirus 2 NEGATIVE NEGATIVE Final    Comment: (NOTE) SARS-CoV-2 target nucleic acids are NOT DETECTED.  The SARS-CoV-2 RNA is generally detectable in upper and lower respiratory specimens during the acute phase of infection. The lowest concentration of SARS-CoV-2 viral copies this assay can detect is 250 copies / mL. A negative result does not preclude SARS-CoV-2 infection and should not be used as the sole basis for treatment or other patient management decisions.  A negative result may occur with improper specimen collection / handling, submission of specimen other than nasopharyngeal swab, presence of viral mutation(s) within the areas targeted by this assay, and inadequate number of viral copies (<250 copies / mL). A negative result must be combined with clinical observations, patient history, and epidemiological information.  Fact Sheet for Patients:   StrictlyIdeas.no  Fact Sheet for Healthcare Providers: BankingDealers.co.za  This test is not yet approved or  cleared by the [place] FDA and has been authorized for detection and/or diagnosis of SARS-CoV-2 by FDA under an Emergency Use Authorization (EUA).  This EUA will remain in effect (meaning this test can be used) for the duration of the COVID-19 declaration under Section 564(b)(1) of the Act, 21 U.S.C. section 360bbb-3(b)(1), unless the authorization is terminated or revoked sooner.  Performed at [facility], [address] [place], [place] [address]   Blood  Culture (routine x 2)     Status: None (Preliminary result)   Collection Time: 03/20/20  9:17 PM   Specimen: BLOOD  Result Value Ref Range Status   Specimen Description BLOOD LEFT FOREARM  Final   Special Requests   Final     BOTTLES DRAWN AEROBIC AND ANAEROBIC Blood Culture results may not be optimal due to an inadequate volume of blood received in culture bottles   Culture   Final    NO GROWTH 4 DAYS Performed at [facility], [address] [place], [place] [address]    Report Status PENDING  Incomplete  Blood Culture (routine x 2)     Status: None (Preliminary result)   Collection Time: 03/20/20 10:11 PM   Specimen: BLOOD  Result Value Ref Range Status   Specimen Description BLOOD RIGHT FOREARM  Final   Special Requests   Final    BOTTLES DRAWN AEROBIC AND ANAEROBIC Blood Culture results may not be optimal due to an inadequate volume of blood received in culture bottles   Culture   Final    NO GROWTH 4 DAYS Performed at [facility], [address] [place], [place] [address]    Report Status PENDING  Incomplete  MRSA PCR Screening     Status: None   Collection Time: 03/21/20  2:07 AM   Specimen: Nasal Mucosa; Nasopharyngeal  Result Value Ref Range Status   MRSA by PCR NEGATIVE NEGATIVE Final    Comment:        The GeneXpert MRSA Assay (FDA approved for NASAL specimens only), is one component of a comprehensive MRSA colonization surveillance program. It is not intended to diagnose MRSA infection nor to guide or monitor treatment for MRSA infections. Performed at [facility], [address] [place], [place] [address]   SARS Coronavirus 2 by RT PCR (hospital order, performed in [place] hospital lab)     Status: None   Collection Time: 03/23/20 10:12 AM  Result Value Ref Range Status   SARS Coronavirus 2 NEGATIVE NEGATIVE Final    Comment: (NOTE) SARS-CoV-2 target nucleic acids are NOT DETECTED.  The SARS-CoV-2 RNA is generally detectable in upper and lower respiratory specimens during the acute phase of infection. The lowest concentration of SARS-CoV-2 viral copies this assay can detect is 250 copies / mL. A negative result does not preclude SARS-CoV-2  infection and should not be used as the sole basis for treatment or other patient management decisions.  A negative result may occur with improper specimen collection / handling, submission of specimen other than nasopharyngeal swab, presence of viral mutation(s) within the areas targeted by this assay, and inadequate number of viral copies (<250 copies / mL). A negative result must be combined with clinical observations, patient history, and epidemiological information.  Fact Sheet for Patients:   StrictlyIdeas.no  Fact Sheet for Healthcare Providers: BankingDealers.co.za  This test is not yet approved or  cleared by the [place] FDA and has been authorized for detection and/or diagnosis of SARS-CoV-2 by FDA under an Emergency Use Authorization (EUA).  This EUA will remain in effect (meaning this test can be used) for the duration of the COVID-19 declaration under Section 564(b)(1) of the Act, 21 U.S.C. section 360bbb-3(b)(1), unless the authorization is terminated or revoked sooner.  Performed at [facility], [address] [place], [place] [address]           IMAGING    [place] EKG SITE RITE  Result Date: 03/23/2020  If [employer] not attached, placement could not be confirmed due to current cardiac rhythm.    Nutrition Status: Nutrition Problem: Increased nutrient needs Etiology: acute illness, chronic illness (CAP; COPD) Signs/Symptoms: estimated needs Interventions: Ensure Enlive (each supplement provides 350kcal and 20 grams of protein)     Indwelling Urinary Catheter continued, requirement due to   Reason to continue Indwelling Urinary Catheter strict Intake/Output monitoring for hemodynamic instability   Central Line/ continued, requirement due to  Reason to continue [place] of central venous pressure or other hemodynamic parameters and poor IV access      ASSESSMENT AND  PLAN SYNOPSIS   Severe ACUTE Hypoxic and Hypercapnic Respiratory Failure due to severe COPD exacerbation with multifocal pneumonia complicated by afib with RVR, HIGH RISK FOR INTUBATION AND CARDIAC ARREST   Severe ACUTE Hypoxic and Hypercapnic Respiratory Failure -continue Full MV support -continue Bronchodilator Therapy -Wean Fio2 and PEEP as tolerated -will perform SAT/SBT when respiratory parameters are met -VAP/VENT bundle implementation  PROBABLE ACUTE SYSTOLIC CARDIAC FAILURE- ECHO PENDING    ACUTE KIDNEY INJURY/Renal Failure -continue Foley Catheter-assess need -Avoid nephrotoxic agents -Follow urine output, BMP -Ensure adequate renal perfusion, optimize oxygenation -Renal dose medications     NEUROLOGY Acute toxic metabolic encephalopathy    CARDIAC ICU monitoring  ID-CAP -continue IV abx as prescibed -follow up cultures  GI GI PROPHYLAXIS as indicated  NUTRITIONAL STATUS  DIET-->NPOConstipation protocol as indicated  ENDO - will use ICU hypoglycemic\Hyperglycemia protocol if indicated     ELECTROLYTES -follow labs as needed -replace as needed -pharmacy consultation and following   DVT/GI PRX ordered and assessed TRANSFUSIONS AS NEEDED MONITOR FSBS I Assessed the need for Labs I Assessed the need for Foley I Assessed the need for Central Venous Line Family Discussion when available I Assessed the need for Mobilization I made an Assessment of medications to be adjusted accordingly Safety Risk assessment completed   CASE DISCUSSED IN MULTIDISCIPLINARY ROUNDS WITH ICU TEAM  Critical Care Time devoted to patient care services described in this note is 32 minutes.   Overall, patient is critically ill, prognosis is guarded.  Patient with Multiorgan failure and at high risk for cardiac arrest and death.    Corrin Parker, M.D.  [facility]  Medical Director [place] Director [facility]  Cardio-Pulmonary Department

## 2020-03-24 NOTE — Progress Notes (Signed)
Chaplain and patient's daughter placed prayer shawl on patient, offered emotional support.

## 2020-03-25 ENCOUNTER — Inpatient Hospital Stay: Payer: Medicare Other

## 2020-03-25 DIAGNOSIS — N179 Acute kidney failure, unspecified: Secondary | ICD-10-CM

## 2020-03-25 DIAGNOSIS — J9601 Acute respiratory failure with hypoxia: Secondary | ICD-10-CM | POA: Diagnosis not present

## 2020-03-25 DIAGNOSIS — J189 Pneumonia, unspecified organism: Secondary | ICD-10-CM | POA: Diagnosis not present

## 2020-03-25 DIAGNOSIS — J441 Chronic obstructive pulmonary disease with (acute) exacerbation: Secondary | ICD-10-CM | POA: Diagnosis not present

## 2020-03-25 LAB — CBC WITH DIFFERENTIAL/PLATELET
Abs Immature Granulocytes: 0.68 10*3/uL — ABNORMAL HIGH (ref 0.00–0.07)
Basophils Absolute: 0.1 10*3/uL (ref 0.0–0.1)
Basophils Relative: 1 %
Eosinophils Absolute: 0 10*3/uL (ref 0.0–0.5)
Eosinophils Relative: 0 %
HCT: 42.4 % (ref 36.0–46.0)
Hemoglobin: 12.7 g/dL (ref 12.0–15.0)
Immature Granulocytes: 5 %
Lymphocytes Relative: 5 %
Lymphs Abs: 0.8 10*3/uL (ref 0.7–4.0)
MCH: 30 pg (ref 26.0–34.0)
MCHC: 30 g/dL (ref 30.0–36.0)
MCV: 100 fL (ref 80.0–100.0)
Monocytes Absolute: 0.6 10*3/uL (ref 0.1–1.0)
Monocytes Relative: 4 %
Neutro Abs: 12.9 10*3/uL — ABNORMAL HIGH (ref 1.7–7.7)
Neutrophils Relative %: 85 %
Platelets: 141 10*3/uL — ABNORMAL LOW (ref 150–400)
RBC: 4.24 MIL/uL (ref 3.87–5.11)
RDW: 16.7 % — ABNORMAL HIGH (ref 11.5–15.5)
WBC: 15 10*3/uL — ABNORMAL HIGH (ref 4.0–10.5)
nRBC: 0 % (ref 0.0–0.2)

## 2020-03-25 LAB — BASIC METABOLIC PANEL
Anion gap: 7 (ref 5–15)
BUN: 56 mg/dL — ABNORMAL HIGH (ref 8–23)
CO2: 26 mmol/L (ref 22–32)
Calcium: 9.8 mg/dL (ref 8.9–10.3)
Chloride: 112 mmol/L — ABNORMAL HIGH (ref 98–111)
Creatinine, Ser: 1.4 mg/dL — ABNORMAL HIGH (ref 0.44–1.00)
GFR calc Af Amer: 46 mL/min — ABNORMAL LOW (ref >60)
GFR calc non Af Amer: 39 mL/min — ABNORMAL LOW (ref >60)
Glucose, Bld: 238 mg/dL — ABNORMAL HIGH (ref 70–99)
Potassium: 4.7 mmol/L (ref 3.5–5.1)
Sodium: 145 mmol/L (ref 135–145)

## 2020-03-25 LAB — EXPECTORATED SPUTUM ASSESSMENT W GRAM STAIN, RFLX TO RESP C: Special Requests: NORMAL

## 2020-03-25 LAB — GLUCOSE, CAPILLARY
Glucose-Capillary: 214 mg/dL — ABNORMAL HIGH (ref 70–99)
Glucose-Capillary: 258 mg/dL — ABNORMAL HIGH (ref 70–99)
Glucose-Capillary: 201 mg/dL — ABNORMAL HIGH (ref 70–99)
Glucose-Capillary: 188 mg/dL — ABNORMAL HIGH (ref 70–99)

## 2020-03-25 LAB — PHOSPHORUS: Phosphorus: 4.8 mg/dL — ABNORMAL HIGH (ref 2.5–4.6)

## 2020-03-25 LAB — CULTURE, BLOOD (ROUTINE X 2)
Culture: NO GROWTH
Culture: NO GROWTH

## 2020-03-25 LAB — CULTURE, RESPIRATORY W GRAM STAIN: Culture: NO GROWTH

## 2020-03-25 LAB — MAGNESIUM: Magnesium: 2.7 mg/dL — ABNORMAL HIGH (ref 1.7–2.4)

## 2020-03-25 MED ORDER — INSULIN ASPART 100 UNIT/ML ~~LOC~~ SOLN
2.0000 [IU] | SUBCUTANEOUS | Status: DC
  Administered 2020-03-25 – 2020-03-26 (×5): 2 [IU] via SUBCUTANEOUS
  Filled 2020-03-25 (×4): qty 1

## 2020-03-25 MED ORDER — ACETAMINOPHEN 500 MG PO TABS
500.0000 mg | ORAL_TABLET | Freq: Four times a day (QID) | ORAL | Status: DC | PRN
  Administered 2020-03-26 – 2020-03-27 (×4): 500 mg via ORAL
  Filled 2020-03-25 (×4): qty 1

## 2020-03-25 MED ORDER — ACETAMINOPHEN 500 MG PO TABS
ORAL_TABLET | ORAL | Status: AC
  Administered 2020-03-25: 500 mg via ORAL
  Filled 2020-03-25: qty 1

## 2020-03-25 NOTE — Progress Notes (Signed)
This RT, Joanne Chars RT, and Beverlee Nims RT assisted Dr. Mortimer Fries and Ander Purpura RN with therapeutic bedside bronchoscopy. No complications occurred during procdure with patient tolerating well. Timeout was performed prior to procedure by Dr. Mortimer Fries with everyone in agreement of correct name and ID.   Time Scope in: 1109 Time Sope out: 1114

## 2020-03-25 NOTE — Progress Notes (Signed)
Inpatient Diabetes Program Recommendations  AACE/ADA: New Consensus Statement on Inpatient Glycemic Control (2015)  Target Ranges:  Prepandial:   less than 140 mg/dL      Peak postprandial:   less than 180 mg/dL (1-2 hours)      Critically ill patients:  140 - 180 mg/dL   Lab Results  Component Value Date   GLUCAP 258 (H) 03/25/2020   HGBA1C 5.3 11/19/2019    Review of Glycemic Control Results for Cynthia [name], Cynthia [name] (MRN [id number]) as of 03/25/2020 10:16  Ref. Range 03/24/2020 20:11 03/24/2020 23:38 03/25/2020 03:32 03/25/2020 07:11  Glucose-Capillary Latest Ref Range: 70 - 99 mg/dL 222 (H) 214 (H) 214 (H) 258 (H)   Diabetes history: No DM hx noted Outpatient Diabetes medications: none Current orders for Inpatient glycemic control: Novolog 0-9 units Q4H Vital @ 55 ml/hr Solumedrol 20 mg BID  Inpatient Diabetes Program Recommendations:    Glucose trends increased most likely due to steroids and start of tube feeds. Consider adding Novolog 2 units Q4H for tube feed coverage (to be stopped if tube feeds are stopped).   Thanks, Bronson Curb, MSN, RNC-OB Diabetes Coordinator [phone] (8a-5p)

## 2020-03-25 NOTE — Progress Notes (Signed)
CRITICAL CARE NOTE  Admitted for COPD exacerbation and Multifocal pneumonia complciated by afib with RVR  6/25 admitted for COPD exacerbation pneumonia 6/26 transferred to ICU for afib with RVR 6/27 required biPAP, high flow [place] 6/28 transferred to [facility] service for increased WOB and agitation with biPAP and high flow [place] 6/28 PICC line placed, started precedex high folow [place] 50%/50L 6/29 high risk for intubation, severe resp failure 6/30 remains intubated, on vent, severe pneumonia   CC  follow up respiratory failure  SUBJECTIVE Patient remains critically ill Prognosis is guarded   BP (!) 156/47   Pulse 79   Temp 99.6 F (37.6 C) (Oral)   Resp 20   Ht 5' 6" (1.676 m)   Wt 62.4 kg   SpO2 95%   BMI 22.20 kg/m    I/O last 3 completed shifts: In: 2072.5 [I.V.:1095.3; NG/GT:877.3; IV Piggyback:100] Out: 1300 [Urine:1300] No intake/output data recorded.  SpO2: 95 % O2 Flow Rate (L/min): 50 L/min FiO2 (%): 35 %  Estimated body mass index is 22.2 kg/m as calculated from the following:   Height as of this encounter: 5' 6" (1.676 m).   Weight as of this encounter: 62.4 kg.  SIGNIFICANT EVENTS   REVIEW OF SYSTEMS  PATIENT IS UNABLE TO PROVIDE COMPLETE REVIEW OF SYSTEMS DUE TO SEVERE CRITICAL ILLNESS        PHYSICAL EXAMINATION:  GENERAL:critically ill appearing, +resp distress HEAD: Normocephalic, atraumatic.  EYES: Pupils equal, round, reactive to light.  No scleral icterus.  MOUTH: Moist mucosal membrane. NECK: Supple.  PULMONARY: +rhonchi, +wheezing CARDIOVASCULAR: S1 and S2. Regular rate and rhythm. No murmurs, rubs, or gallops.  GASTROINTESTINAL: Soft, nontender, -distended.  Positive bowel sounds.   MUSCULOSKELETAL: No swelling, clubbing, or edema.  NEUROLOGIC: obtunded, GCS<8 SKIN:intact,warm,dry  MEDICATIONS: I have reviewed all medications and confirmed regimen as documented   CULTURE RESULTS   Recent Results (from the past 240 hour(s))  SARS  Coronavirus 2 by RT PCR (hospital order, performed in [facility] hospital lab) Nasopharyngeal Nasopharyngeal Swab     Status: None   Collection Time: 03/20/20  8:19 PM   Specimen: Nasopharyngeal Swab  Result Value Ref Range Status   SARS Coronavirus 2 NEGATIVE NEGATIVE Final    Comment: (NOTE) SARS-CoV-2 target nucleic acids are NOT DETECTED.  The SARS-CoV-2 RNA is generally detectable in upper and lower respiratory specimens during the acute phase of infection. The lowest concentration of SARS-CoV-2 viral copies this assay can detect is 250 copies / mL. A negative result does not preclude SARS-CoV-2 infection and should not be used as the sole basis for treatment or other patient management decisions.  A negative result may occur with improper specimen collection / handling, submission of specimen other than nasopharyngeal swab, presence of viral mutation(s) within the areas targeted by this assay, and inadequate number of viral copies (<250 copies / mL). A negative result must be combined with clinical observations, patient history, and epidemiological information.  Fact Sheet for Patients:   StrictlyIdeas.no  Fact Sheet for Healthcare Providers: BankingDealers.co.za  This test is not yet approved or  cleared by the [place] FDA and has been authorized for detection and/or diagnosis of SARS-CoV-2 by FDA under an Emergency Use Authorization (EUA).  This EUA will remain in effect (meaning this test can be used) for the duration of the COVID-19 declaration under Section 564(b)(1) of the Act, 21 U.S.C. section 360bbb-3(b)(1), unless the authorization is terminated or revoked sooner.  Performed at [facility], [address] [place], [place] [address]   Blood Culture (routine x 2)     Status: None   Collection Time: 03/20/20  9:17 PM   Specimen: BLOOD  Result Value Ref Range Status   Specimen Description BLOOD LEFT  FOREARM  Final   Special Requests   Final    BOTTLES DRAWN AEROBIC AND ANAEROBIC Blood Culture results may not be optimal due to an inadequate volume of blood received in culture bottles   Culture   Final    NO GROWTH 5 DAYS Performed at [facility], [address] [place], [place] [address]    Report Status 03/25/2020 FINAL  Final  Blood Culture (routine x 2)     Status: None   Collection Time: 03/20/20 10:11 PM   Specimen: BLOOD  Result Value Ref Range Status   Specimen Description BLOOD RIGHT FOREARM  Final   Special Requests   Final    BOTTLES DRAWN AEROBIC AND ANAEROBIC Blood Culture results may not be optimal due to an inadequate volume of blood received in culture bottles   Culture   Final    NO GROWTH 5 DAYS Performed at [facility], [address] [place], [place] [address]    Report Status 03/25/2020 FINAL  Final  MRSA PCR Screening     Status: None   Collection Time: 03/21/20  2:07 AM   Specimen: Nasal Mucosa; Nasopharyngeal  Result Value Ref Range Status   MRSA by PCR NEGATIVE NEGATIVE Final    Comment:        The GeneXpert MRSA Assay (FDA approved for NASAL specimens only), is one component of a comprehensive MRSA colonization surveillance program. It is not intended to diagnose MRSA infection nor to guide or monitor treatment for MRSA infections. Performed at [facility], [address] [place], [place] [address]   SARS Coronavirus 2 by RT PCR (hospital order, performed in [place] hospital lab)     Status: None   Collection Time: 03/23/20 10:12 AM  Result Value Ref Range Status   SARS Coronavirus 2 NEGATIVE NEGATIVE Final    Comment: (NOTE) SARS-CoV-2 target nucleic acids are NOT DETECTED.  The SARS-CoV-2 RNA is generally detectable in upper and lower respiratory specimens during the acute phase of infection. The lowest concentration of SARS-CoV-2 viral copies this assay can detect is 250 copies / mL. A negative  result does not preclude SARS-CoV-2 infection and should not be used as the sole basis for treatment or other patient management decisions.  A negative result may occur with improper specimen collection / handling, submission of specimen other than nasopharyngeal swab, presence of viral mutation(s) within the areas targeted by this assay, and inadequate number of viral copies (<250 copies / mL). A negative result must be combined with clinical observations, patient history, and epidemiological information.  Fact Sheet for Patients:   StrictlyIdeas.no  Fact Sheet for Healthcare Providers: BankingDealers.co.za  This test is not yet approved or  cleared by the [place] FDA and has been authorized for detection and/or diagnosis of SARS-CoV-2 by FDA under an Emergency Use Authorization (EUA).  This EUA will remain in effect (meaning this test can be used) for the duration of the COVID-19 declaration under Section 564(b)(1) of the Act, 21 U.S.C. section 360bbb-3(b)(1), unless the authorization is terminated or revoked sooner.  Performed at [facility], [address] [place], [place] [address]   Culture, respiratory (non-expectorated)     Status: None (Preliminary result)   Collection  Time: 03/24/20  4:39 PM   Specimen: Tracheal Aspirate; Respiratory  Result Value Ref Range Status   Specimen Description   Final    TRACHEAL ASPIRATE Performed at [facility], [address] [place], [place] [address]    Special Requests   Final    NONE Performed at [facility], [address] [place], [place] [address]    Gram Stain   Final    RARE WBC PRESENT, PREDOMINANTLY PMN FEW SQUAMOUS EPITHELIAL CELLS PRESENT NO ORGANISMS SEEN Performed at [facility], [place] [address] [place], [place] [address]    Culture PENDING  Incomplete   Report Status PENDING  Incomplete            Indwelling Urinary  Catheter continued, requirement due to   Reason to continue Indwelling Urinary Catheter strict Intake/Output monitoring for hemodynamic instability   Central Line/ continued, requirement due to  Reason to continue [place] of central venous pressure or other hemodynamic parameters and poor IV access   Ventilator continued, requirement due to severe respiratory failure   Ventilator Sedation RASS 0 to -2      ASSESSMENT AND PLAN SYNOPSIS Severe ACUTE Hypoxic and Hypercapnic Respiratory Failuredue to severe COPD exacerbation with multifocal pneumonia complicated by afib with RVR  SEVERE COPD EXACERBATION -continue IV steroids as prescribed -continue NEB THERAPY as prescribed  Severe ACUTE Hypoxic and Hypercapnic Respiratory Failure -continue Full MV support -continue Bronchodilator Therapy -Wean Fio2 and PEEP as tolerated -will perform SAT/SBT when respiratory parameters are met -VAP/VENT bundle implementation   ACUTE KIDNEY INJURY/Renal Failure -continue Foley Catheter-assess need -Avoid nephrotoxic agents -Follow urine output, BMP -Ensure adequate renal perfusion, optimize oxygenation -Renal dose medications     NEUROLOGY Acute toxic metabolic encephalopathy, need for sedation Goal RASS -2 to -3   CARDIAC ICU monitoring  ID -continue IV abx as prescibed -follow up cultures  GI GI PROPHYLAXIS as indicated   DIET-->TF's as tolerated Constipation protocol as indicated  ENDO - will use ICU hypoglycemic\Hyperglycemia protocol if indicated     ELECTROLYTES -follow labs as needed -replace as needed -pharmacy consultation and following   DVT/GI PRX ordered and assessed TRANSFUSIONS AS NEEDED MONITOR FSBS I Assessed the need for Labs I Assessed the need for Foley I Assessed the need for Central Venous Line Family Discussion when available I Assessed the need for Mobilization I made an Assessment of medications to be adjusted  accordingly Safety Risk assessment completed   CASE DISCUSSED IN MULTIDISCIPLINARY ROUNDS WITH ICU TEAM  Critical Care Time devoted to patient care services described in this note is 32 minutes.   Overall, patient is critically ill, prognosis is guarded.  Patient with Multiorgan failure and at high risk for cardiac arrest and death.    Corrin Parker, M.D.  [facility]  Medical Director [place] Director [facility] Cardio-Pulmonary Department

## 2020-03-25 NOTE — Procedures (Signed)
PROCEDURE: BRONCHOSCOPY Therapeutic Aspiration of Tracheobronchial Tree  PROCEDURE DATE: 03/25/2020  TIME:  NAME:  Cynthia [name]  DOB:1956/07/09  MRN: [id number] LOC:  IC01A/IC01A-AA    HOSP DAY:     Code Status Orders  (From admission, onward)         Start     Ordered   03/21/20 0047  Full code  Continuous        03/21/20 0047        Code Status History    Date Active Date Inactive Code Status Order ID Comments User Context   03/20/2020 2230 03/21/2020 0047 Full Code [id number]  Kristopher Oppenheim, DO ED   10/29/2019 1603 10/30/2019 0025 Full Code [id number]  Schnier, Dolores Lory, MD Inpatient   Advance Care Planning Activity          Indications/Preliminary Diagnosis: PNEUMONIA  Consent: (Place X beside choice/s below)  The benefits, risks and possible complications of the procedure were        explained to:  ___ patient  _x__ patient's family  ___ other:___________  who verbalized understanding and gave:  ___ verbal  ___ written  _x__ verbal and written  ___ telephone  ___ other:________ consent.      Unable to obtain consent; procedure performed on emergent basis.     Other:       PRESEDATION ASSESSMENT: History and Physical has been performed. Patient meds and allergies have been reviewed. Presedation airway examination has been performed and documented. Baseline vital signs, sedation score, oxygenation status, and cardiac rhythm were reviewed. Patient was deemed to be in satisfactory condition to undergo the procedure.     PROCEDURE DETAILS: Timeout performed and correct patient, name, & ID confirmed. Following prep per Pulmonary policy, appropriate sedation was administered. The Bronchoscope was inserted in to oral cavity with bite block in place. Therapeutic aspiration of Tracheobronchial tree was performed.  Airway exam proceeded with findings, technical procedures, and specimen collection as noted below. At the end of exam the scope was withdrawn without incident.  Impression and Plan as noted below.           Airway Prep (Place X beside choice below)   1% Transtracheal Lidocaine Anesthetization 7 cc   Patient prepped per Bronchoscopy Lab Policy       Insertion Route (Place X beside choice below)   Nasal   Oral  x Endotracheal Tube   Tracheostomy    Medication Amt Dose  Medication Amt Dose  Lidocaine 1%  cc  Epinephrine 1:10,000 sol  cc  Xylocaine 4%  cc  Cocaine  cc   TECHNICAL PROCEDURES: (Place X beside choice below)   Procedures  Description    None     Electrocautery     Cryotherapy     Balloon Dilatation     Bronchography     Stent Placement   x  Therapeutic Aspiration THICK MUCOID SECRETIONS    Laser/Argon Plasma    Brachytherapy Catheter Placement    Foreign Body Removal         SPECIMENS (Sites): (Place X beside choice below)  Specimens Description   No Specimens Obtained     Washings   x Lavage LLL mucoid secretions extracted   Biopsies    Fine Needle Aspirates    Brushings    Sputum    FINDINGS: extensive mucoid secretions ESTIMATED BLOOD LOSS: none COMPLICATIONS/RESOLUTION: none      IMPRESSION:POST-PROCEDURE DX: pneumonia    RECOMMENDATION/PLAN:   Continue Vent support Continue IV abx  BAL cultures   Corrin Parker, M.D.  [facility]  Medical Director [place] Director [facility] Cardio-Pulmonary Department

## 2020-03-25 NOTE — Progress Notes (Signed)
CH encountered pt.'s husband [name] in ICU hallway as he left rm.  Husband said his wife came to ED via EMS after having difficulty breathing --> diagnosed w/pneumonia.  Husband said he has gone through their house and thrown out all tobacco products after seeing his wife intubated and in critical care from breathing problems.  'Sometimes God gives you a wake-up call,' he shared.  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  [place] will continue to monitor pt. and family needs and remains available.    03/25/20 1545  Clinical Encounter Type  Visited With Family  Visit Type Initial;Psychological support;Social support;Spiritual support;Critical Care  Spiritual Encounters  Spiritual Needs Emotional  Stress Factors  Family Stress Factors Major life changes;Loss of control;Health changes

## 2020-03-26 DIAGNOSIS — J189 Pneumonia, unspecified organism: Secondary | ICD-10-CM | POA: Diagnosis not present

## 2020-03-26 DIAGNOSIS — J441 Chronic obstructive pulmonary disease with (acute) exacerbation: Secondary | ICD-10-CM | POA: Diagnosis not present

## 2020-03-26 DIAGNOSIS — J9601 Acute respiratory failure with hypoxia: Secondary | ICD-10-CM | POA: Diagnosis not present

## 2020-03-26 LAB — PHOSPHORUS: Phosphorus: 3.5 mg/dL (ref 2.5–4.6)

## 2020-03-26 LAB — BASIC METABOLIC PANEL
Anion gap: 6 (ref 5–15)
BUN: 61 mg/dL — ABNORMAL HIGH (ref 8–23)
CO2: 26 mmol/L (ref 22–32)
Calcium: 10 mg/dL (ref 8.9–10.3)
Chloride: 117 mmol/L — ABNORMAL HIGH (ref 98–111)
Creatinine, Ser: 1.44 mg/dL — ABNORMAL HIGH (ref 0.44–1.00)
GFR calc Af Amer: 44 mL/min — ABNORMAL LOW (ref >60)
GFR calc non Af Amer: 38 mL/min — ABNORMAL LOW (ref >60)
Glucose, Bld: 304 mg/dL — ABNORMAL HIGH (ref 70–99)
Potassium: 5 mmol/L (ref 3.5–5.1)
Sodium: 149 mmol/L — ABNORMAL HIGH (ref 135–145)

## 2020-03-26 LAB — CBC WITH DIFFERENTIAL/PLATELET
Abs Immature Granulocytes: 0.63 10*3/uL — ABNORMAL HIGH (ref 0.00–0.07)
Basophils Absolute: 0.1 10*3/uL (ref 0.0–0.1)
Basophils Relative: 0 %
Eosinophils Absolute: 0 10*3/uL (ref 0.0–0.5)
Eosinophils Relative: 0 %
HCT: 41.9 % (ref 36.0–46.0)
Hemoglobin: 12.5 g/dL (ref 12.0–15.0)
Immature Granulocytes: 5 %
Lymphocytes Relative: 4 %
Lymphs Abs: 0.6 10*3/uL — ABNORMAL LOW (ref 0.7–4.0)
MCH: 30.3 pg (ref 26.0–34.0)
MCHC: 29.8 g/dL — ABNORMAL LOW (ref 30.0–36.0)
MCV: 101.7 fL — ABNORMAL HIGH (ref 80.0–100.0)
Monocytes Absolute: 0.2 10*3/uL (ref 0.1–1.0)
Monocytes Relative: 2 %
Neutro Abs: 12 10*3/uL — ABNORMAL HIGH (ref 1.7–7.7)
Neutrophils Relative %: 89 %
Platelets: 134 10*3/uL — ABNORMAL LOW (ref 150–400)
RBC: 4.12 MIL/uL (ref 3.87–5.11)
RDW: 17.1 % — ABNORMAL HIGH (ref 11.5–15.5)
WBC: 13.5 10*3/uL — ABNORMAL HIGH (ref 4.0–10.5)
nRBC: 0 % (ref 0.0–0.2)

## 2020-03-26 LAB — MAGNESIUM: Magnesium: 2.6 mg/dL — ABNORMAL HIGH (ref 1.7–2.4)

## 2020-03-26 LAB — HEMOGLOBIN A1C
Hgb A1c MFr Bld: 5.4 % (ref 4.8–5.6)
Mean Plasma Glucose: 108 mg/dL

## 2020-03-26 LAB — GLUCOSE, CAPILLARY
Glucose-Capillary: 198 mg/dL — ABNORMAL HIGH (ref 70–99)
Glucose-Capillary: 331 mg/dL — ABNORMAL HIGH (ref 70–99)
Glucose-Capillary: 256 mg/dL — ABNORMAL HIGH (ref 70–99)
Glucose-Capillary: 323 mg/dL — ABNORMAL HIGH (ref 70–99)
Glucose-Capillary: 377 mg/dL — ABNORMAL HIGH (ref 70–99)
Glucose-Capillary: 182 mg/dL — ABNORMAL HIGH (ref 70–99)
Glucose-Capillary: 162 mg/dL — ABNORMAL HIGH (ref 70–99)

## 2020-03-26 MED ORDER — LABETALOL HCL 5 MG/ML IV SOLN
10.0000 mg | INTRAVENOUS | Status: DC | PRN
  Administered 2020-03-26 – 2020-03-27 (×2): 20 mg via INTRAVENOUS
  Administered 2020-03-27 (×2): 10 mg via INTRAVENOUS
  Administered 2020-03-27: 20 mg via INTRAVENOUS
  Administered 2020-03-28 – 2020-03-29 (×2): 10 mg via INTRAVENOUS
  Filled 2020-03-26 (×6): qty 4

## 2020-03-26 MED ORDER — INSULIN GLARGINE 100 UNIT/ML ~~LOC~~ SOLN
10.0000 [IU] | Freq: Every day | SUBCUTANEOUS | Status: DC
  Administered 2020-03-26 – 2020-04-07 (×13): 10 [IU] via SUBCUTANEOUS
  Filled 2020-03-26 (×14): qty 0.1

## 2020-03-26 MED ORDER — INSULIN ASPART 100 UNIT/ML ~~LOC~~ SOLN
5.0000 [IU] | SUBCUTANEOUS | Status: DC
  Administered 2020-03-26 – 2020-03-31 (×27): 5 [IU] via SUBCUTANEOUS
  Filled 2020-03-26 (×28): qty 1

## 2020-03-26 NOTE — Progress Notes (Signed)
CRITICAL CARE NOTE  Admitted for COPD exacerbation and Multifocal pneumonia complicated by afib with RVR  6/25 admitted for COPD exacerbation pneumonia 6/26 transferred to ICU for afib with RVR 6/27 required biPAP, high flow [place] 6/28 transferred to [facility] service for increased WOB and agitation with biPAP and high flow [place] 6/28 PICC line placed, started precedex high folow [place] 50%/50L 6/29 high risk for intubation, severe resp failure, failed biPAP +intubation 6/30 remains intubated, on vent, severe pneumonia 7/1 remains intubated, sedated, severe end stage COPD   CC  follow up respiratory failure  HPI Patient remains critically ill Prognosis is guarded Severe COPD Plan for SAT/SBT   BP (!) 135/43   Pulse 76   Temp 99.4 F (37.4 C) (Axillary)   Resp (!) 21   Ht '5\' 6"'  (1.676 m)   Wt 61.7 kg   SpO2 94%   BMI 21.95 kg/m    I/O last 3 completed shifts: In: 2954.9 [I.V.:1029.9; NG/GT:1925] Out: 1800 [Urine:1800] No intake/output data recorded.  SpO2: 94 % O2 Flow Rate (L/min): 50 L/min FiO2 (%): 35 %  Estimated body mass index is 21.95 kg/m as calculated from the following:   Height as of this encounter: '5\' 6"'  (1.676 m).   Weight as of this encounter: 61.7 kg.  SIGNIFICANT EVENTS   REVIEW OF SYSTEMS  PATIENT IS UNABLE TO PROVIDE COMPLETE REVIEW OF SYSTEMS DUE TO SEVERE CRITICAL ILLNESS        PHYSICAL EXAMINATION:  GENERAL:critically ill appearing, +resp distress HEAD: Normocephalic, atraumatic.  EYES: Pupils equal, round, reactive to light.  No scleral icterus.  MOUTH: Moist mucosal membrane. NECK: Supple.  PULMONARY: +rhonchi, +wheezing CARDIOVASCULAR: S1 and S2. Regular rate and rhythm. No murmurs, rubs, or gallops.  GASTROINTESTINAL: Soft, nontender, -distended.  Positive bowel sounds.   MUSCULOSKELETAL: No swelling, clubbing, or edema.  NEUROLOGIC: obtunded, GCS<8 SKIN:intact,warm,dry  MEDICATIONS: I have reviewed all medications and confirmed  regimen as documented   CULTURE RESULTS   Recent Results (from the past 240 hour(s))  SARS Coronavirus 2 by RT PCR (hospital order, performed in [facility] hospital lab) Nasopharyngeal Nasopharyngeal Swab     Status: None   Collection Time: 03/20/20  8:19 PM   Specimen: Nasopharyngeal Swab  Result Value Ref Range Status   SARS Coronavirus 2 NEGATIVE NEGATIVE Final    Comment: (NOTE) SARS-CoV-2 target nucleic acids are NOT DETECTED.  The SARS-CoV-2 RNA is generally detectable in upper and lower respiratory specimens during the acute phase of infection. The lowest concentration of SARS-CoV-2 viral copies this assay can detect is 250 copies / mL. A negative result does not preclude SARS-CoV-2 infection and should not be used as the sole basis for treatment or other patient management decisions.  A negative result may occur with improper specimen collection / handling, submission of specimen other than nasopharyngeal swab, presence of viral mutation(s) within the areas targeted by this assay, and inadequate number of viral copies (<250 copies / mL). A negative result must be combined with clinical observations, patient history, and epidemiological information.  Fact Sheet for Patients:   StrictlyIdeas.no  Fact Sheet for Healthcare Providers: BankingDealers.co.za  This test is not yet approved or  cleared by the [place] FDA and has been authorized for detection and/or diagnosis of SARS-CoV-2 by FDA under an Emergency Use Authorization (EUA).  This EUA will remain in effect (meaning this test can be used) for the duration of the COVID-19 declaration under Section 564(b)(1) of the Act, 21 U.S.C. section 360bbb-3(b)(1), unless  the authorization is terminated or revoked sooner.  Performed at [facility], [address] [place], [place] [address]   Blood Culture (routine x 2)     Status: None   Collection Time:  03/20/20  9:17 PM   Specimen: BLOOD  Result Value Ref Range Status   Specimen Description BLOOD LEFT FOREARM  Final   Special Requests   Final    BOTTLES DRAWN AEROBIC AND ANAEROBIC Blood Culture results may not be optimal due to an inadequate volume of blood received in culture bottles   Culture   Final    NO GROWTH 5 DAYS Performed at [facility], [address] [place], [place] [address]    Report Status 03/25/2020 FINAL  Final  Blood Culture (routine x 2)     Status: None   Collection Time: 03/20/20 10:11 PM   Specimen: BLOOD  Result Value Ref Range Status   Specimen Description BLOOD RIGHT FOREARM  Final   Special Requests   Final    BOTTLES DRAWN AEROBIC AND ANAEROBIC Blood Culture results may not be optimal due to an inadequate volume of blood received in culture bottles   Culture   Final    NO GROWTH 5 DAYS Performed at [facility], [address] [place], [place] [address]    Report Status 03/25/2020 FINAL  Final  MRSA PCR Screening     Status: None   Collection Time: 03/21/20  2:07 AM   Specimen: Nasal Mucosa; Nasopharyngeal  Result Value Ref Range Status   MRSA by PCR NEGATIVE NEGATIVE Final    Comment:        The GeneXpert MRSA Assay (FDA approved for NASAL specimens only), is one component of a comprehensive MRSA colonization surveillance program. It is not intended to diagnose MRSA infection nor to guide or monitor treatment for MRSA infections. Performed at [facility], [address] [place], [place] [address]   SARS Coronavirus 2 by RT PCR (hospital order, performed in [place] hospital lab)     Status: None   Collection Time: 03/23/20 10:12 AM  Result Value Ref Range Status   SARS Coronavirus 2 NEGATIVE NEGATIVE Final    Comment: (NOTE) SARS-CoV-2 target nucleic acids are NOT DETECTED.  The SARS-CoV-2 RNA is generally detectable in upper and lower respiratory specimens during the acute phase of infection. The  lowest concentration of SARS-CoV-2 viral copies this assay can detect is 250 copies / mL. A negative result does not preclude SARS-CoV-2 infection and should not be used as the sole basis for treatment or other patient management decisions.  A negative result may occur with improper specimen collection / handling, submission of specimen other than nasopharyngeal swab, presence of viral mutation(s) within the areas targeted by this assay, and inadequate number of viral copies (<250 copies / mL). A negative result must be combined with clinical observations, patient history, and epidemiological information.  Fact Sheet for Patients:   StrictlyIdeas.no  Fact Sheet for Healthcare Providers: BankingDealers.co.za  This test is not yet approved or  cleared by the [place] FDA and has been authorized for detection and/or diagnosis of SARS-CoV-2 by FDA under an Emergency Use Authorization (EUA).  This EUA will remain in effect (meaning this test can be used) for the duration of the COVID-19 declaration under Section 564(b)(1) of the Act, 21 U.S.C. section 360bbb-3(b)(1), unless the authorization is terminated or revoked sooner.  Performed at [facility], [address], [place],  [address]  Culture, respiratory (non-expectorated)     Status: None   Collection Time: 03/24/20  4:39 PM   Specimen: Tracheal Aspirate; Respiratory  Result Value Ref Range Status   Specimen Description   Final    TRACHEAL ASPIRATE Performed at [facility], [address], [place], [place] [address]    Special Requests   Final    NONE Performed at [facility], [address] [place], [place] [address]    Gram Stain   Final    RARE WBC PRESENT, PREDOMINANTLY PMN FEW SQUAMOUS EPITHELIAL CELLS PRESENT NO ORGANISMS SEEN    Culture   Final    NO GROWTH Performed at [facility], [place] [address] [place],  [place] [address]    Report Status 03/25/2020 FINAL  Final  Expectorated sputum assessment w rflx to resp cult     Status: None   Collection Time: 03/25/20 11:21 AM   Specimen: Sputum  Result Value Ref Range Status   Specimen Description SPUTUM  Final   Special Requests Normal  Final   Sputum evaluation   Final    THIS SPECIMEN IS ACCEPTABLE FOR SPUTUM CULTURE Performed at [facility], [address] [place], [place] [address]    Report Status 03/25/2020 FINAL  Final  Culture, respiratory     Status: None (Preliminary result)   Collection Time: 03/25/20 11:21 AM   Specimen: SPU  Result Value Ref Range Status   Specimen Description   Final    SPUTUM Performed at [facility], [address] [place], [place] [address]    Special Requests   Final    Normal Reflexed from [phone] Performed at [facility], [address] [place], [place] [address]    Gram Stain PENDING  Incomplete   Culture   Final    NO GROWTH < 24 HOURS Performed at [facility], [place] [address], [place],  [address]    Report Status PENDING  Incomplete      CBC    Component Value Date/Time   WBC 13.5 (H) 03/26/2020 0540   RBC 4.12 03/26/2020 0540   HGB 12.5 03/26/2020 0540   HGB 16.1 (H) 09/17/2015 1543   HCT 41.9 03/26/2020 0540   HCT 47.1 (H) 09/17/2015 1543   PLT 134 (L) 03/26/2020 0540   PLT 167 09/17/2015 1543   MCV 101.7 (H) 03/26/2020 0540   MCV 91 09/17/2015 1543   MCH 30.3 03/26/2020 0540   MCHC 29.8 (L) 03/26/2020 0540   RDW 17.1 (H) 03/26/2020 0540   RDW 13.7 09/17/2015 1543   LYMPHSABS 0.6 (L) 03/26/2020 0540   LYMPHSABS 2.4 09/17/2015 1543   MONOABS 0.2 03/26/2020 0540   EOSABS 0.0 03/26/2020 0540   EOSABS 0.1 09/17/2015 1543   BASOSABS 0.1 03/26/2020 0540   BASOSABS 0.1 09/17/2015 1543   BMP Latest Ref Rng & Units 03/26/2020 03/25/2020 03/24/2020  Glucose 70 - 99 mg/dL 304(H) 238(H) 139(H)  BUN 8 - 23 mg/dL 61(H) 56(H) 44(H)  Creatinine 0.44 -  1.00 mg/dL 1.44(H) 1.40(H) 1.20(H)  BUN/Creat Ratio 6 - 22 (calc) - - -  Sodium 135 - 145 mmol/L 149(H) 145 142  Potassium 3.5 - 5.1 mmol/L 5.0 4.7 4.6  Chloride 98 - 111 mmol/L 117(H) 112(H) 111  CO2 22 - 32 mmol/L '26 26 23  ' Calcium 8.9 - 10.3 mg/dL 10.0 9.8 9.7     Indwelling Urinary Catheter continued, requirement due to   Reason to continue Indwelling Urinary Catheter  strict Intake/Output monitoring for hemodynamic instability   Central Line/ continued, requirement due to  Reason to continue [employer] of central venous pressure or other hemodynamic parameters and poor IV access   Ventilator continued, requirement due to severe respiratory failure   Ventilator Sedation RASS 0 to -2      ASSESSMENT AND PLAN SYNOPSIS  Severe ACUTE Hypoxic and Hypercapnic Respiratory Failuredue to severe COPD exacerbation with multifocal pneumonia complicated by afib with RVR  SEVERE COPD EXACERBATION -continue IV steroids as prescribed -continue NEB THERAPY as prescribed  Severe ACUTE Hypoxic and Hypercapnic Respiratory Failure -continue Full MV support -continue Bronchodilator Therapy -Wean Fio2 and PEEP as tolerated -will perform SAT/SBT when respiratory parameters are met -VAP/VENT bundle implementation  COPD PULM HTN COR PULMONALE Left ventricular ejection fraction 50 to 55%.  The left ventricle has low normal function.  moderately elevated pulmonary artery systolic  pressure.    ACUTE KIDNEY INJURY/Renal Failure -continue Foley Catheter-assess need -Avoid nephrotoxic agents -Follow urine output, BMP -Ensure adequate renal perfusion, optimize oxygenation -Renal dose medications    NEUROLOGY - intubated and sedated - minimal sedation to achieve a RASS goal: -1 Wake up assessment pending   CARDIAC ICU monitoring  ID s/p bronch -continue IV abx as prescibed -follow up cultures  GI GI PROPHYLAXIS as indicated  DIET-->TF's as tolerated Constipation  protocol as indicated  ENDO - will use ICU hypoglycemic\Hyperglycemia protocol if indicated     ELECTROLYTES -follow labs as needed -replace as needed -pharmacy consultation and following   DVT/GI PRX ordered and assessed TRANSFUSIONS AS NEEDED MONITOR FSBS I Assessed the need for Labs I Assessed the need for Foley I Assessed the need for Central Venous Line Family Discussion when available I Assessed the need for Mobilization I made an Assessment of medications to be adjusted accordingly Safety Risk assessment completed   CASE DISCUSSED IN MULTIDISCIPLINARY ROUNDS WITH ICU TEAM  Critical Care Time devoted to patient care services described in this note is 32 minutes.   Overall, patient is critically ill, prognosis is guarded.    Corrin Parker, M.D.  [facility]  Medical Director [place] Director [facility] Cardio-Pulmonary Department

## 2020-03-26 NOTE — Progress Notes (Signed)
Shift summary:  - Assumed care of patient from Hollice Gong, RN at 1415 hrs.

## 2020-03-26 NOTE — Progress Notes (Signed)
Patient Name: Cynthia [name] Date of Encounter: 03/26/2020  Hospital Problem List     Principal Problem:   CAP (community acquired pneumonia) Active Problems:   Essential hypertension, benign   Traumatic brain injury ([place])   Seizures ([place])   Bipolar affective disorder ([place])   Artificial cardiac pacemaker   COPD with acute exacerbation ([place])   AKI (acute kidney injury) ([place])   Acute respiratory failure ([place])    Patient Profile        64 y.o.femalewith history ofCAD, second-degree heart block s/p PPM placement, PVD s/p multiple stent placements (11/2019), h/o CVA, emphysema, hypertension, hyperlipidemia, h/o TBI with residual memory deficit, seizures, chronic hepatitis C, bipolar disorder, h/o substance abuse, PTSD and tobacco use whowas admitted withincreasing shortness of breath and was diagnosed with mild to moderate to bibasilar atelectasis or infiltrate with a small left pleural effusion. She was noted to be in atrial fibrillation with rapid ventricular response on admission. Initially responded to IV metoprolol however had recurrence and was placed on IV amiodarone. Since IV amiodarone has been started, it does not appear she has had any significant atrial arrhythmia previously although is a difficult historian. Her respiratory status required intubation. Currently intubated and sedated. Hemodynamically stable at present.  Subjective   Intubated   Inpatient Medications    . amantadine  100 mg Oral Daily  . amLODipine  5 mg Oral Daily  . budesonide (PULMICORT) nebulizer solution  0.5 mg Nebulization BID  . chlorhexidine gluconate (MEDLINE KIT)  15 mL Mouth Rinse BID  . Chlorhexidine Gluconate Cloth  6 each Topical Daily  . clopidogrel  75 mg Oral Daily  . folic acid  1 mg Oral Daily  . gabapentin  300 mg Oral Daily  . heparin  5,000 Units Subcutaneous Q8H  . insulin aspart  0-9 Units Subcutaneous Q4H  . insulin aspart  2 Units Subcutaneous Q4H  .  ipratropium-albuterol  3 mL Nebulization Q4H  . lithium carbonate  300 mg Oral BID WC  . LORazepam  0-4 mg Intravenous Q12H  . mouth rinse  15 mL Mouth Rinse 10 times per day  . methylPREDNISolone (SOLU-MEDROL) injection  20 mg Intravenous Q12H  . multivitamin with minerals  1 tablet Oral Daily  . Oxcarbazepine  300 mg Oral QHS  . polyethylene glycol  17 g Oral Daily  . QUEtiapine  100 mg Oral QHS  . QUEtiapine  50 mg Oral QPC breakfast  . senna  1 tablet Oral QHS  . sodium chloride flush  10-40 mL Intracatheter Q12H  . thiamine  100 mg Oral Daily   Or  . thiamine  100 mg Intravenous Daily    Vital Signs    Vitals:   03/26/20 0900 03/26/20 1000 03/26/20 1100 03/26/20 1200  BP: (!) 143/44 (!) 139/47 (!) 165/50 (!) 135/49  Pulse: 72 69 72 74  Resp: '20 20 19 ' (!) 21  Temp:      TempSrc:      SpO2: 95% 95% 95% 96%  Weight:      Height:        Intake/Output Summary (Last 24 hours) at 03/26/2020 1304 Last data filed at 03/26/2020 0600 Gross per 24 hour  Intake 1839.39 ml  Output 1050 ml  Net 789.39 ml   Filed Weights   03/24/20 0400 03/25/20 0444 03/26/20 0500  Weight: 64.3 kg 62.4 kg 61.7 kg    Physical Exam    GEN: intubated and sedated.  HEENT: normal.  Neck: Supple, no JVD,  carotid bruits, or masses. Cardiac: RRR, no murmurs, rubs, or gallops. No clubbing, cyanosis, edema.  Radials/DP/PT 2+ and equal bilaterally.  Respiratory:  Respirations regular . Breathing with vent GI: Soft, nontender, nondistended, BS + x 4. MS: no deformity or atrophy. Skin: warm and dry, no rash. Neuro:  Intubated and sedated  Labs    CBC Recent Labs    03/25/20 0424 03/26/20 0540  WBC 15.0* 13.5*  NEUTROABS 12.9* 12.0*  HGB 12.7 12.5  HCT 42.4 41.9  MCV 100.0 101.7*  PLT 141* 248*   Basic Metabolic Panel Recent Labs    03/25/20 0424 03/26/20 0540  NA 145 149*  K 4.7 5.0  CL 112* 117*  CO2 26 26  GLUCOSE 238* 304*  BUN 56* 61*  CREATININE 1.40* 1.44*  CALCIUM 9.8 10.0   MG 2.7* 2.6*  PHOS 4.8* 3.5   Liver Function Tests No results for input(s): AST, ALT, ALKPHOS, BILITOT, PROT, ALBUMIN in the last 72 hours. No results for input(s): LIPASE, AMYLASE in the last 72 hours. Cardiac Enzymes No results for input(s): CKTOTAL, CKMB, CKMBINDEX, TROPONINI in the last 72 hours. BNP No results for input(s): BNP in the last 72 hours. D-Dimer No results for input(s): DDIMER in the last 72 hours. Hemoglobin A1C Recent Labs    03/24/20 0556  HGBA1C 5.4   Fasting Lipid Panel No results for input(s): CHOL, HDL, LDLCALC, TRIG, CHOLHDL, LDLDIRECT in the last 72 hours. Thyroid Function Tests No results for input(s): TSH, T4TOTAL, T3FREE, THYROIDAB in the last 72 hours.  Invalid input(s): FREET3  Telemetry    nsr   ECG    nsr with no ischemia  Radiology    CT ABDOMEN PELVIS WO CONTRAST  Result Date: 03/20/2020 CLINICAL DATA:  Periumbilical abdominal pain. EXAM: CT ABDOMEN AND PELVIS WITHOUT CONTRAST TECHNIQUE: Multidetector CT imaging of the abdomen and pelvis was performed following the standard protocol without IV contrast. COMPARISON:  None. FINDINGS: Lower chest: Marked severity infiltrates are seen within the posterior aspect of the bilateral lower lobes, left greater than right. Chronic posterior right ninth and tenth rib fractures are seen. Hepatobiliary: No focal liver abnormality is seen. The gallbladder is markedly distended without evidence of gallstones, gallbladder wall thickening, or biliary dilatation. Pancreas: Unremarkable. No pancreatic ductal dilatation or surrounding inflammatory changes. Spleen: Normal in size without focal abnormality. Adrenals/Urinary Tract: 3.1 cm x 1.5 cm and 1.5 cm x 1.2 cm low-attenuation left adrenal masses are seen. Kidneys are normal, without renal calculi, focal lesion, or hydronephrosis. Bladder is unremarkable. Stomach/Bowel: Stomach is within normal limits. Appendix appears normal. No evidence of bowel wall  thickening, distention, or inflammatory changes. Vascular/Lymphatic: There is marked severity calcification of the abdominal aorta. Bilateral common iliac artery and bilateral external iliac artery stents are seen. No enlarged abdominal or pelvic lymph nodes. Reproductive: Uterus and bilateral adnexa are unremarkable. Other: No abdominal wall hernia or abnormality. No abdominopelvic ascites. Musculoskeletal: Marked severity degenerative changes are seen at the level of L5-S1. IMPRESSION: 1. Marked severity bilateral lower lobe infiltrates, left greater than right. 2. Low-attenuation left adrenal masses which may represent adrenal adenomas. 3. Marked severity calcification of the abdominal aorta. 4. Bilateral common iliac artery and bilateral external iliac artery stents. 5. Marked severity degenerative changes at the level of L5-S1. Aortic Atherosclerosis (ICD10-I70.0). Electronically Signed   By: Virgina Norfolk M.D.   On: 03/20/2020 20:52   DG Chest 1 View  Result Date: 03/21/2020 CLINICAL DATA:  Abdominal distension EXAM: CHEST  1 VIEW  COMPARISON:  Radiograph 03/20/2020, CT abdomen pelvis 03/20/2020, CT chest 11/25/2019 FINDINGS: Dual lead pacer pack overlies left chest wall with leads in stable position at the right atrium and cardiac apex. Telemetry leads and nasal cannula overlie the chest. Persistent bibasilar consolidative opacities. Suspect a trace left effusion. Some fissural and septal thickening is noted with increasing central venous congestion and central cuffing. Could reflect superimposed edema. Remote posttraumatic deformity of the distal right clavicle. No acute osseous or soft tissue abnormality. The aorta is calcified. The remaining cardiomediastinal contours are unremarkable. IMPRESSION: 1. Persistent bibasilar consolidative opacities. 2. Increasing central venous congestion, cuffing and septal thickening. Likely developing interstitial edema. Electronically Signed   By: Lovena Le M.D.    On: 03/21/2020 02:15   DG Chest 2 View  Result Date: 03/20/2020 CLINICAL DATA:  Shortness of breath. EXAM: CHEST - 2 VIEW COMPARISON:  July 11, 2017 FINDINGS: There is a dual lead AICD. Mild to moderate severity areas of atelectasis and/or infiltrate are seen within the bilateral lung bases. There is a small left pleural effusion. No pneumothorax is identified. The heart size and mediastinal contours are within normal limits. The visualized skeletal structures are unremarkable. IMPRESSION: 1. Mild to moderate severity bibasilar atelectasis and/or infiltrate. 2. Small left pleural effusion. Electronically Signed   By: Virgina Norfolk M.D.   On: 03/20/2020 20:53   DG Abd 1 View  Result Date: 03/21/2020 CLINICAL DATA:  Abdominal distension EXAM: ABDOMEN - 1 VIEW COMPARISON:  CT abdomen pelvis 03/20/2020 FINDINGS: Persistent consolidative opacities in the lung bases. Trace left effusion. Cardiomediastinal contours are stable from prior. No high-grade obstructive bowel gas pattern is seen in the abdomen or pelvis. Moderate colonic stool burden. Air and stool projects over the rectal vault. Multiple vascular calcifications noted throughout the upper abdomen including over the renal pelves compatible with findings on CT. No suspicious calcifications. Vascular stenting of the iliac arteries. Multilevel degenerative changes in the spine, hips and pelvis. External rotation of the right hip limits evaluation of the femoral neck. If there is concern for femoral abnormality, recommend dedicated imaging. IMPRESSION: 1. No high-grade obstructive bowel gas pattern in the abdomen or pelvis. 2. Moderate colonic stool burden. 3. Persistent consolidative opacities in the lung bases. Trace left effusion. Electronically Signed   By: Lovena Le M.D.   On: 03/21/2020 02:12   DG Chest Port 1 View  Result Date: 03/25/2020 CLINICAL DATA:  Shortness of breath EXAM: PORTABLE CHEST 1 VIEW COMPARISON:  03/24/2020 FINDINGS:  Cardiac shadow is stable. Pacing device is again seen. Endotracheal tube and gastric catheter are noted in satisfactory position. Right-sided PICC line is noted in satisfactory position. Patchy airspace opacities are noted but slightly improved when compared with the prior exam. No bony abnormality is noted. IMPRESSION: Slight improved aeration when compared with the prior study. Electronically Signed   By: Inez Catalina M.D.   On: 03/25/2020 10:36   DG Chest Port 1 View  Result Date: 03/24/2020 CLINICAL DATA:  Respiratory distress EXAM: PORTABLE CHEST 1 VIEW COMPARISON:  None. FINDINGS: Endotracheal tube essentially at the carina. Recommend retraction by 3 to 4 cm. NG tube in stomach. Normal cardiac silhouette. Diffuse airspace disease slightly increased in density. Small LEFT effusion unchanged IMPRESSION: 1. Endotracheal tube at carina.  Consider retraction by 3-4 cm. 2. Diffuse bilateral airspace disease slightly worse. These results will be called to the ordering clinician or representative by the Radiologist Assistant, and communication documented in the PACS or [employer]. Electronically Signed  By: Suzy Bouchard M.D.   On: 03/24/2020 08:58   DG Chest Port 1 View  Result Date: 03/22/2020 CLINICAL DATA:  Acute respiratory failure EXAM: PORTABLE CHEST 1 VIEW COMPARISON:  March 21, 2020 FINDINGS: Stable pacemaker. The cardiomediastinal silhouette is stable. No pneumothorax. Bilateral patchy pulmonary infiltrates have worsened in the interval. IMPRESSION: Increasing bilateral pulmonary infiltrates may represent diffuse multifocal pneumonia versus developing asymmetric edema. Recommend clinical correlation and short-term follow-up to ensure resolution. Electronically Signed   By: Dorise Bullion III M.D   On: 03/22/2020 07:58   ECHOCARDIOGRAM COMPLETE  Result Date: 03/24/2020    ECHOCARDIOGRAM REPORT   Patient Name:   DALYAH PLA Date of Exam: 03/23/2020 Medical Rec #:  [id number]      Height:        66.0 in Accession #:    [id number]     Weight:       141.5 lb Date of Birth:  1956-09-24      BSA:          1.727 m Patient Age:    70 years       BP:           148/62 mmHg Patient Gender: F              HR:           82 bpm. Exam Location:  [facility] Procedure: 2D Echo, Cardiac Doppler and Color Doppler Indications:     Acute Respiratory Insufficiency 518.82 / R06.89  History:         Patient has prior history of Echocardiogram examinations. Risk                  Factors:Hypertension. 2nd degree heart block.  Sonographer:     Alyse Low Roar Referring Phys:  [id number] Flora Lipps Diagnosing Phys: Isaias Cowman MD IMPRESSIONS  1. Left ventricular ejection fraction, by estimation, is 50 to 55%. The left ventricle has low normal function. The left ventricle has no regional wall motion abnormalities. There is mild left ventricular hypertrophy. Left ventricular diastolic parameters were normal.  2. Right ventricular systolic function is normal. The right ventricular size is normal. There is moderately elevated pulmonary artery systolic pressure.  3. The mitral valve is normal in structure. Mild mitral valve regurgitation. No evidence of mitral stenosis.  4. The aortic valve is normal in structure. Aortic valve regurgitation is mild to moderate. No aortic stenosis is present.  5. The inferior vena cava is normal in size with greater than 50% respiratory variability, suggesting right atrial pressure of 3 mmHg. FINDINGS  Left Ventricle: Left ventricular ejection fraction, by estimation, is 50 to 55%. The left ventricle has low normal function. The left ventricle has no regional wall motion abnormalities. The left ventricular internal cavity size was normal in size. There is mild left ventricular hypertrophy. Left ventricular diastolic parameters were normal. Right Ventricle: The right ventricular size is normal. No increase in right ventricular wall thickness. Right ventricular systolic function is normal. There is  moderately elevated pulmonary artery systolic pressure. The tricuspid regurgitant velocity is 3.07 m/s, and with an assumed right atrial pressure of 10 mmHg, the estimated right ventricular systolic pressure is 73.7 mmHg. Left Atrium: Left atrial size was normal in size. Right Atrium: Right atrial size was normal in size. Pericardium: There is no evidence of pericardial effusion. Mitral Valve: The mitral valve is normal in structure. Normal mobility of the mitral valve leaflets. Mild mitral valve regurgitation. No evidence of mitral  valve stenosis. Tricuspid Valve: The tricuspid valve is normal in structure. Tricuspid valve regurgitation is mild . No evidence of tricuspid stenosis. Aortic Valve: The aortic valve is normal in structure. Aortic valve regurgitation is mild to moderate. Aortic regurgitation PHT measures 410 msec. No aortic stenosis is present. Aortic valve mean gradient measures 6.0 mmHg. Aortic valve peak gradient measures 13.8 mmHg. Aortic valve area, by VTI measures 1.95 cm. Pulmonic Valve: The pulmonic valve was normal in structure. Pulmonic valve regurgitation is not visualized. No evidence of pulmonic stenosis. Aorta: The aortic root is normal in size and structure. Venous: The inferior vena cava is normal in size with greater than 50% respiratory variability, suggesting right atrial pressure of 3 mmHg. IAS/Shunts: No atrial level shunt detected by color flow Doppler.  LEFT VENTRICLE PLAX 2D LVIDd:         4.40 cm  Diastology LVIDs:         3.13 cm  LV e' lateral:   13.90 cm/s LV PW:         1.21 cm  LV E/e' lateral: 5.9 LV IVS:        1.30 cm  LV e' medial:    10.60 cm/s LVOT diam:     1.70 cm  LV E/e' medial:  7.8 LV SV:         64 LV SV Index:   37 LVOT Area:     2.27 cm  RIGHT VENTRICLE RV Mid diam:    3.04 cm RV S prime:     15.40 cm/s LEFT ATRIUM             Index       RIGHT ATRIUM           Index LA diam:        3.55 cm 2.06 cm/m  RA Area:     14.50 cm LA Vol (A2C):   45.5 ml 26.35  ml/m RA Volume:   35.40 ml  20.50 ml/m LA Vol (A4C):   40.9 ml 23.69 ml/m LA Biplane Vol: 44.2 ml 25.60 ml/m  AORTIC VALVE                    PULMONIC VALVE AV Area (Vmax):    1.93 cm     PV Vmax:        1.18 m/s AV Area (Vmean):   1.88 cm     PV Peak grad:   5.6 mmHg AV Area (VTI):     1.95 cm     RVOT Peak grad: 2 mmHg AV Vmax:           186.00 cm/s AV Vmean:          109.000 cm/s AV VTI:            0.330 m AV Peak Grad:      13.8 mmHg AV Mean Grad:      6.0 mmHg LVOT Vmax:         158.00 cm/s LVOT Vmean:        90.200 cm/s LVOT VTI:          0.283 m LVOT/AV VTI ratio: 0.86 AI PHT:            410 msec  AORTA Ao Root diam: 2.50 cm MITRAL VALVE               TRICUSPID VALVE MV Area (PHT): 3.23 cm    TR Peak grad:   37.7 mmHg MV Decel Time:  235 msec    TR Vmax:        307.00 cm/s MV E velocity: 82.60 cm/s MV A velocity: 70.00 cm/s  SHUNTS MV E/A ratio:  1.18        Systemic VTI:  0.28 m MV A Prime:    10.2 cm/s   Systemic Diam: 1.70 cm Isaias Cowman MD Electronically signed by Isaias Cowman MD Signature Date/Time: 03/24/2020/7:47:47 AM    Final    [place] EKG SITE RITE  Result Date: 03/23/2020 If Site Rite image not attached, placement could not be confirmed due to current cardiac rhythm.   Assessment & Plan    64 y.o.female patient with PMH significant for CAD, second-degree heart block s/p PPM placement, PVD s/p multiple stent placements (11/2019), h/o CVA, emphysema, hypertension, hyperlipidemia, h/o TBI with residual memory deficit, seizures, chronic hepatitis C, bipolar disorder, h/o substance abuse, PTSD and tobacco use who presentsto the hospital with increasing shortness of breath noted to have palpable airspace disease. Has had intermittent atrial fibrillation. Tested negative for covid on admission. Currently intubated  1. A. fib-patient has a history of second-degree heart block status post permanent pacemaker. Review of records suggest that she had preserved LV function by  echo in the past as well as a negative functional study and normal LV function by functional study. Etiology of her A. fib is likely secondary to her pneumonia. CHA2DS2-VASc score is 4. We will continue with IV amiodarone for now until taking po. Will defer chronic anticoagulation at present as this appears to be transient A. fib although this will need to be addressed prior to discharge.   2. Second-degree heart block-status post permanent pacemaker in November 2010. Pacer sensing appropriately  3. Peripheral vascular disease-status post percutaneous intervention in the right common iliac artery, left common iliac artery and left external iliac artery and February of this year. On 81 mg of aspirin and clopidogrel at 75 mg daily. We will continue with this when taking po.  4. Hypertension-will need to readdress when extubated.   5. Tobacco abuse-continues to smoke cigarettes.  6. Bipolar disorder-on lithium carbonate at 300 mg daily   7. Pneumonia-inbutated and on abx.   Signed, Javier Docker Ivis Henneman MD 03/26/2020, 1:04 PM  Pager: [phone]

## 2020-03-26 NOTE — Plan of Care (Signed)

## 2020-03-26 NOTE — Progress Notes (Signed)
Inpatient Diabetes Program Recommendations  AACE/ADA: New Consensus Statement on Inpatient Glycemic Control (2015)  Target Ranges:  Prepandial:   less than 140 mg/dL      Peak postprandial:   less than 180 mg/dL (1-2 hours)      Critically ill patients:  140 - 180 mg/dL   Lab Results  Component Value Date   GLUCAP 377 (H) 03/26/2020   HGBA1C 5.4 03/24/2020    Review of Glycemic Control Results for ADAMARY, SAVARY (MRN [id number]) as of 03/26/2020 13:58  Ref. Range 03/25/2020 23:39 03/26/2020 03:45 03/26/2020 08:02 03/26/2020 11:28  Glucose-Capillary Latest Ref Range: 70 - 99 mg/dL 331 (H) 256 (H) 323 (H) 377 (H)   Inpatient Diabetes Program Recommendations:   While on steroids and tube feeds please consider: -Increase in tube feed coverage to 5 units q 4 hrs. (hold if tube feed held or stopped for any reason) Secure chat Dr. Mortimer Fries.  Thank you, Nani Gasser. Minola Guin, RN, MSN, CDE  Diabetes Coordinator Inpatient Glycemic Control Team Team Pager [phone] (8am-5pm) 03/26/2020 2:00 PM

## 2020-03-27 ENCOUNTER — Inpatient Hospital Stay: Payer: Medicare Other

## 2020-03-27 LAB — CBC WITH DIFFERENTIAL/PLATELET
Abs Immature Granulocytes: 0.34 10*3/uL — ABNORMAL HIGH (ref 0.00–0.07)
Basophils Absolute: 0 10*3/uL (ref 0.0–0.1)
Basophils Relative: 0 %
Eosinophils Absolute: 0 10*3/uL (ref 0.0–0.5)
Eosinophils Relative: 0 %
HCT: 43.6 % (ref 36.0–46.0)
Hemoglobin: 12.8 g/dL (ref 12.0–15.0)
Immature Granulocytes: 2 %
Lymphocytes Relative: 6 %
Lymphs Abs: 0.8 10*3/uL (ref 0.7–4.0)
MCH: 29.7 pg (ref 26.0–34.0)
MCHC: 29.4 g/dL — ABNORMAL LOW (ref 30.0–36.0)
MCV: 101.2 fL — ABNORMAL HIGH (ref 80.0–100.0)
Monocytes Absolute: 0.3 10*3/uL (ref 0.1–1.0)
Monocytes Relative: 2 %
Neutro Abs: 13 10*3/uL — ABNORMAL HIGH (ref 1.7–7.7)
Neutrophils Relative %: 90 %
Platelets: 153 10*3/uL (ref 150–400)
RBC: 4.31 MIL/uL (ref 3.87–5.11)
RDW: 16.7 % — ABNORMAL HIGH (ref 11.5–15.5)
WBC: 14.4 10*3/uL — ABNORMAL HIGH (ref 4.0–10.5)
nRBC: 0 % (ref 0.0–0.2)

## 2020-03-27 LAB — GLUCOSE, CAPILLARY
Glucose-Capillary: 142 mg/dL — ABNORMAL HIGH (ref 70–99)
Glucose-Capillary: 176 mg/dL — ABNORMAL HIGH (ref 70–99)
Glucose-Capillary: 179 mg/dL — ABNORMAL HIGH (ref 70–99)
Glucose-Capillary: 189 mg/dL — ABNORMAL HIGH (ref 70–99)
Glucose-Capillary: 240 mg/dL — ABNORMAL HIGH (ref 70–99)
Glucose-Capillary: 250 mg/dL — ABNORMAL HIGH (ref 70–99)
Glucose-Capillary: 250 mg/dL — ABNORMAL HIGH (ref 70–99)

## 2020-03-27 LAB — PHOSPHORUS: Phosphorus: 3.1 mg/dL (ref 2.5–4.6)

## 2020-03-27 LAB — BASIC METABOLIC PANEL
Anion gap: 6 (ref 5–15)
BUN: 61 mg/dL — ABNORMAL HIGH (ref 8–23)
CO2: 27 mmol/L (ref 22–32)
Calcium: 10 mg/dL (ref 8.9–10.3)
Chloride: 117 mmol/L — ABNORMAL HIGH (ref 98–111)
Creatinine, Ser: 1.31 mg/dL — ABNORMAL HIGH (ref 0.44–1.00)
GFR calc Af Amer: 49 mL/min — ABNORMAL LOW (ref >60)
GFR calc non Af Amer: 43 mL/min — ABNORMAL LOW (ref >60)
Glucose, Bld: 265 mg/dL — ABNORMAL HIGH (ref 70–99)
Potassium: 5.4 mmol/L — ABNORMAL HIGH (ref 3.5–5.1)
Sodium: 150 mmol/L — ABNORMAL HIGH (ref 135–145)

## 2020-03-27 LAB — MAGNESIUM: Magnesium: 2.5 mg/dL — ABNORMAL HIGH (ref 1.7–2.4)

## 2020-03-27 LAB — AMMONIA: Ammonia: 51 umol/L — ABNORMAL HIGH (ref 9–35)

## 2020-03-27 MED ORDER — CLOPIDOGREL BISULFATE 75 MG PO TABS
75.0000 mg | ORAL_TABLET | Freq: Every day | ORAL | Status: DC
  Administered 2020-03-27 – 2020-04-02 (×7): 75 mg
  Filled 2020-03-27 (×7): qty 1

## 2020-03-27 MED ORDER — LACTULOSE 10 GM/15ML PO SOLN
10.0000 g | Freq: Two times a day (BID) | ORAL | Status: DC
  Administered 2020-03-28 – 2020-04-02 (×12): 10 g
  Filled 2020-03-27 (×12): qty 30

## 2020-03-27 MED ORDER — SODIUM ZIRCONIUM CYCLOSILICATE 5 G PO PACK: 5.0000 g | PACK | Freq: Two times a day (BID) | ORAL | Status: DC

## 2020-03-27 MED ORDER — OXCARBAZEPINE 300 MG PO TABS
300.0000 mg | ORAL_TABLET | Freq: Every day | ORAL | Status: DC
  Administered 2020-03-27: 300 mg
  Filled 2020-03-27: qty 1

## 2020-03-27 MED ORDER — SODIUM ZIRCONIUM CYCLOSILICATE 5 G PO PACK
5.0000 g | PACK | Freq: Two times a day (BID) | ORAL | Status: AC
  Administered 2020-03-27 (×2): 5 g
  Filled 2020-03-27 (×2): qty 1

## 2020-03-27 MED ORDER — AMANTADINE HCL 100 MG PO CAPS
100.0000 mg | ORAL_CAPSULE | Freq: Every day | ORAL | Status: DC
  Administered 2020-03-27: 100 mg
  Filled 2020-03-27 (×2): qty 1

## 2020-03-27 MED ORDER — GABAPENTIN 300 MG PO CAPS
300.0000 mg | ORAL_CAPSULE | Freq: Every day | ORAL | Status: DC
  Administered 2020-03-27 – 2020-03-31 (×4): 300 mg
  Filled 2020-03-27 (×4): qty 1

## 2020-03-27 MED ORDER — QUETIAPINE FUMARATE 25 MG PO TABS
50.0000 mg | ORAL_TABLET | Freq: Every day | ORAL | Status: DC
  Administered 2020-03-27: 50 mg
  Filled 2020-03-27: qty 2

## 2020-03-27 MED ORDER — PANTOPRAZOLE SODIUM 40 MG PO PACK
40.0000 mg | PACK | Freq: Every day | ORAL | Status: DC
  Administered 2020-03-27 – 2020-04-01 (×6): 40 mg
  Filled 2020-03-27 (×7): qty 20

## 2020-03-27 MED ORDER — FOLIC ACID 1 MG PO TABS
1.0000 mg | ORAL_TABLET | Freq: Every day | ORAL | Status: DC
  Administered 2020-03-27 – 2020-04-02 (×7): 1 mg
  Filled 2020-03-27 (×7): qty 1

## 2020-03-27 MED ORDER — FREE WATER
200.0000 mL | Status: DC
  Administered 2020-03-27 – 2020-03-29 (×13): 200 mL

## 2020-03-27 MED ORDER — QUETIAPINE FUMARATE 25 MG PO TABS
100.0000 mg | ORAL_TABLET | Freq: Every day | ORAL | Status: DC
  Administered 2020-03-27: 100 mg
  Filled 2020-03-27: qty 4

## 2020-03-27 MED ORDER — POLYETHYLENE GLYCOL 3350 17 G PO PACK
17.0000 g | PACK | Freq: Every day | ORAL | Status: DC
  Administered 2020-03-27 – 2020-04-01 (×4): 17 g
  Filled 2020-03-27 (×5): qty 1

## 2020-03-27 MED ORDER — ACETAMINOPHEN 500 MG PO TABS
500.0000 mg | ORAL_TABLET | Freq: Four times a day (QID) | ORAL | Status: DC | PRN
  Administered 2020-03-30: 500 mg
  Filled 2020-03-27: qty 1

## 2020-03-27 MED ORDER — SENNA 8.6 MG PO TABS
1.0000 | ORAL_TABLET | Freq: Every day | ORAL | Status: DC
  Administered 2020-03-27 – 2020-03-31 (×5): 8.6 mg
  Filled 2020-03-27 (×5): qty 1

## 2020-03-27 MED ORDER — ADULT MULTIVITAMIN W/MINERALS CH
1.0000 | ORAL_TABLET | Freq: Every day | ORAL | Status: DC
  Administered 2020-03-27 – 2020-04-02 (×7): 1
  Filled 2020-03-27 (×7): qty 1

## 2020-03-27 MED ORDER — AMLODIPINE BESYLATE 5 MG PO TABS
5.0000 mg | ORAL_TABLET | Freq: Every day | ORAL | Status: DC
  Administered 2020-03-27 – 2020-04-02 (×7): 5 mg
  Filled 2020-03-27 (×7): qty 1

## 2020-03-27 NOTE — Clinical Social Work Note (Signed)
CSW acknowledges consult for medication assistance. Patient has Medicare Part B and Medicaid listed in her chart. Unable to determine if she has a Medicare Part D plan from her chart. Patient currently on ventilator. Will continue to follow progress and determine needs when appropriate.  [place] Scrape, [place]

## 2020-03-27 NOTE — Plan of Care (Signed)
  Problem: Nutrition: Goal: Adequate nutrition will be maintained Outcome: Progressing   Problem: Education: Goal: Knowledge of General Education information will improve Description: Including pain rating scale, medication(s)/side effects and non-pharmacologic comfort measures Outcome: Not Progressing   Problem: Health Behavior/Discharge Planning: Goal: Ability to manage health-related needs will improve Outcome: Not Progressing   Problem: Activity: Goal: Risk for activity intolerance will decrease Outcome: Not Progressing   

## 2020-03-27 NOTE — Progress Notes (Signed)
SHIFT SUMMARY:   Patient remains unresponsive throughout this shift. Husband at bedside for the majority of the morning. Patients daughter updated via phone call this morning. Head CT obtained this afternoon. Patient remains on IV Amiodarone gtt. Multiple PRN doses of Labetalol given d/t HTN.  Patients spouse updated at bedside by Dr. Duwayne Heck.

## 2020-03-27 NOTE — Progress Notes (Addendum)
Follow up - Critical Care Medicine Note  BRIEF PATIENT DESCRIPTION:    Cynthia [name] is an 64 y.o. female with PMH of COPD, Seizure disorder, second-degree heart block s/p PPM placement, PVD s/p multiple stent placements (11/2019), h/o CVA, Emphysema, HTN, HLD,TBI with residual memory deficit, seizures, chronic hepatitis C, bipolar disorder, h/o substance abuse, PTSD and tobacco use who was admitted with acute hypoxic respiratory failure secondary to CAP and COPD with acute exacerbation.    SIGNIFICANT EVENTS  6/25- Admitted to medsurg unit 6/26- Worsening acute hypoxic respiratory failure and Afib with RVR HR 173 transferred to stepdown unit. Amiodarone started, requiring increased oxygen after arrival to ICU and placed on Bipap FIO2 at 45% 6/27- Continued Bipap, HFNC 6/28-transferred to PCCM service for increased WOB and agitation with biPAP and high flow [place] 6/28-PICC line placed, started precedex high folow [place] 50%/50L 6/29-Patient required placement of an artificial airway secondary to Respiratory Failure 7/2-Repeat CT head due to prolonged Encephalopathy  CULTURES: SARS-CoV-2 PCR 6/25>> Negative MRSA PCR 6/26> Negative Blood culture x2 6/26>>No growth x 5 days  ANTIBIOTICS: Azithromycin 6/26>> Ceftriaxone 6/26> Cefepime 6/26> STOPPED    Lines/tubes : Airway 7.5 mm (Active)  Secured at (cm) 22 cm 03/27/20 0734  Measured From Lips 03/27/20 0734  Secured Location Left 03/27/20 0734  Secured By [employer] 03/27/20 0734  Tube Holder Repositioned Yes 03/27/20 0734  Cuff Pressure (cm H2O) 25 cm H2O 03/27/20 0734  Site Condition Dry 03/27/20 0734     PICC Triple Lumen 03/23/20 PICC Right Brachial 35 cm 0 cm (Active)  Indication for Insertion or Continuance of Line Prolonged intravenous therapies 03/27/20 0900  Exposed Catheter (cm) 0 cm 03/24/20 0200  Site Assessment Clean;Dry;Intact 03/27/20 0900  Lumen #1 Status Infusing 03/27/20 0900  Lumen #2 Status Saline  locked 03/27/20 0900  Lumen #3 Status Saline locked 03/27/20 0900  Dressing Type Transparent 03/27/20 0900  Dressing Status Clean;Dry;Intact;Antimicrobial disc in place 03/27/20 0900  Safety Lock Not Applicable 94/70/96 2836  Line Care Connections checked and tightened 03/27/20 0900  Line Adjustment (NICU/IV Team Only) No 03/23/20 1300  Dressing Intervention New dressing 03/23/20 1300  Dressing Change Due 03/30/20 03/27/20 0900     NG/OG Tube Orogastric Xray Documented cm marking at nare/ corner of mouth 66 cm (Active)  Cm Marking at Nare/Corner of Mouth (if applicable) 65 cm 62/94/76 0347  Site Assessment Clean;Dry 03/27/20 0347  Ongoing Placement Verification No change in cm markings or external length of tube from initial placement 03/27/20 0347  Status Infusing tube feed 03/27/20 0347  Intake (mL) 30 mL 03/26/20 1700  Output (mL) 0 mL 03/26/20 1600     Urethral Catheter Lauren B. RN Non-latex 14 Fr. (Active)  Indication for Insertion or Continuance of Catheter Unstable spinal/crush injuries / Multisystem Trauma 03/27/20 0731  Site Assessment Intact;Clean 03/27/20 0739  Catheter Maintenance Bag below level of bladder;Catheter secured;Drainage bag/tubing not touching floor;No dependent loops 03/27/20 0739  Collection Container Standard drainage bag 03/27/20 0739  Securement Method Securing device (Describe) 03/27/20 0739  Urinary Catheter Interventions (if applicable) Unclamped 54/65/03 0739  Output (mL) 325 mL 03/27/20 0800    Microbiology/Sepsis markers: Results for orders placed or performed during the hospital encounter of 03/20/20  SARS Coronavirus 2 by RT PCR (hospital order, performed in [facility] hospital lab) Nasopharyngeal Nasopharyngeal Swab     Status: None   Collection Time: 03/20/20  8:19 PM   Specimen: Nasopharyngeal Swab  Result Value Ref Range Status   SARS Coronavirus  2 NEGATIVE NEGATIVE Final    Comment: (NOTE) SARS-CoV-2 target nucleic acids are NOT  DETECTED.  The SARS-CoV-2 RNA is generally detectable in upper and lower respiratory specimens during the acute phase of infection. The lowest concentration of SARS-CoV-2 viral copies this assay can detect is 250 copies / mL. A negative result does not preclude SARS-CoV-2 infection and should not be used as the sole basis for treatment or other patient management decisions.  A negative result may occur with improper specimen collection / handling, submission of specimen other than nasopharyngeal swab, presence of viral mutation(s) within the areas targeted by this assay, and inadequate number of viral copies (<250 copies / mL). A negative result must be combined with clinical observations, patient history, and epidemiological information.  Fact Sheet for Patients:   StrictlyIdeas.no  Fact Sheet for Healthcare Providers: BankingDealers.co.za  This test is not yet approved or  cleared by the [place] FDA and has been authorized for detection and/or diagnosis of SARS-CoV-2 by FDA under an Emergency Use Authorization (EUA).  This EUA will remain in effect (meaning this test can be used) for the duration of the COVID-19 declaration under Section 564(b)(1) of the Act, 21 U.S.C. section 360bbb-3(b)(1), unless the authorization is terminated or revoked sooner.  Performed at [facility], [address] [place], [place] [address]   Blood Culture (routine x 2)     Status: None   Collection Time: 03/20/20  9:17 PM   Specimen: BLOOD  Result Value Ref Range Status   Specimen Description BLOOD LEFT FOREARM  Final   Special Requests   Final    BOTTLES DRAWN AEROBIC AND ANAEROBIC Blood Culture results may not be optimal due to an inadequate volume of blood received in culture bottles   Culture   Final    NO GROWTH 5 DAYS Performed at [facility], [address] [place], [place] [address]    Report Status 03/25/2020  FINAL  Final  Blood Culture (routine x 2)     Status: None   Collection Time: 03/20/20 10:11 PM   Specimen: BLOOD  Result Value Ref Range Status   Specimen Description BLOOD RIGHT FOREARM  Final   Special Requests   Final    BOTTLES DRAWN AEROBIC AND ANAEROBIC Blood Culture results may not be optimal due to an inadequate volume of blood received in culture bottles   Culture   Final    NO GROWTH 5 DAYS Performed at [facility], [address] [place], [place] [address]    Report Status 03/25/2020 FINAL  Final  MRSA PCR Screening     Status: None   Collection Time: 03/21/20  2:07 AM   Specimen: Nasal Mucosa; Nasopharyngeal  Result Value Ref Range Status   MRSA by PCR NEGATIVE NEGATIVE Final    Comment:        The GeneXpert MRSA Assay (FDA approved for NASAL specimens only), is one component of a comprehensive MRSA colonization surveillance program. It is not intended to diagnose MRSA infection nor to guide or monitor treatment for MRSA infections. Performed at [facility], [address] [place], [place] [address]   SARS Coronavirus 2 by RT PCR (hospital order, performed in [facility] hospital lab)     Status: None   Collection Time: 03/23/20 10:12 AM  Result Value Ref Range Status   SARS Coronavirus 2 NEGATIVE NEGATIVE Final    Comment: (NOTE) SARS-CoV-2 target nucleic acids are NOT DETECTED.  The SARS-CoV-2 RNA is  generally detectable in upper and lower respiratory specimens during the acute phase of infection. The lowest concentration of SARS-CoV-2 viral copies this assay can detect is 250 copies / mL. A negative result does not preclude SARS-CoV-2 infection and should not be used as the sole basis for treatment or other patient management decisions.  A negative result may occur with improper specimen collection / handling, submission of specimen other than nasopharyngeal swab, presence of viral mutation(s) within the areas targeted by this  assay, and inadequate number of viral copies (<250 copies / mL). A negative result must be combined with clinical observations, patient history, and epidemiological information.  Fact Sheet for Patients:   StrictlyIdeas.no  Fact Sheet for Healthcare Providers: BankingDealers.co.za  This test is not yet approved or  cleared by the [place] FDA and has been authorized for detection and/or diagnosis of SARS-CoV-2 by FDA under an Emergency Use Authorization (EUA).  This EUA will remain in effect (meaning this test can be used) for the duration of the COVID-19 declaration under Section 564(b)(1) of the Act, 21 U.S.C. section 360bbb-3(b)(1), unless the authorization is terminated or revoked sooner.  Performed at [facility], [address] [place], [place] [address]   Culture, respiratory (non-expectorated)     Status: None   Collection Time: 03/24/20  4:39 PM   Specimen: Tracheal Aspirate; Respiratory  Result Value Ref Range Status   Specimen Description   Final    TRACHEAL ASPIRATE Performed at [facility], [address] [place], [place] [address]    Special Requests   Final    NONE Performed at [facility], [address] [place], [place] [address]    Gram Stain   Final    RARE WBC PRESENT, PREDOMINANTLY PMN FEW SQUAMOUS EPITHELIAL CELLS PRESENT NO ORGANISMS SEEN    Culture   Final    NO GROWTH Performed at [facility], [place] [address] [place], [place] [address]    Report Status 03/25/2020 FINAL  Final  Expectorated sputum assessment w rflx to resp cult     Status: None   Collection Time: 03/25/20 11:21 AM   Specimen: Sputum  Result Value Ref Range Status   Specimen Description SPUTUM  Final   Special Requests Normal  Final   Sputum evaluation   Final    THIS SPECIMEN IS ACCEPTABLE FOR SPUTUM CULTURE Performed at [facility], [address], [place], [place]  [address]    Report Status 03/25/2020 FINAL  Final  Culture, respiratory     Status: None (Preliminary result)   Collection Time: 03/25/20 11:21 AM   Specimen: SPU  Result Value Ref Range Status   Specimen Description   Final    SPUTUM Performed at [facility], [address], [place], [place] [address]    Special Requests   Final    Normal Reflexed from [id number] Performed at [facility], [place], [place] [address]    Gram Stain NO WBC SEEN NO ORGANISMS SEEN   Final   Culture   Final    NO GROWTH 2 DAYS Performed at [facility], [place] [address], [place], [place] [address]    Report Status PENDING  Incomplete    Anti-infectives:  Anti-infectives (From admission, onward)   Start     Dose/Rate Route Frequency Ordered Stop   03/21/20 1200  azithromycin (ZITHROMAX) tablet 500 mg        500 mg Oral Daily 03/21/20 0047  03/26/20 0959   03/21/20 0900  cefTRIAXone (ROCEPHIN) 2 g in sodium chloride 0.9 % 100 mL IVPB        2 g 200 mL/hr over 30 Minutes Intravenous Every 24 hours 03/21/20 0047 03/25/20 1051   03/20/20 2130  ceFEPIme (MAXIPIME) 2 g in sodium chloride 0.9 % 100 mL IVPB        2 g 200 mL/hr over 30 Minutes Intravenous  Once 03/20/20 2117 03/20/20 2246   03/20/20 2130  azithromycin (ZITHROMAX) 500 mg in sodium chloride 0.9 % 250 mL IVPB        500 mg 250 mL/hr over 60 Minutes Intravenous  Once 03/20/20 2117 03/20/20 2325     Scheduled Meds: . amantadine  100 mg Per Tube Daily  . amLODipine  5 mg Per Tube Daily  . budesonide (PULMICORT) nebulizer solution  0.5 mg Nebulization BID  . chlorhexidine gluconate (MEDLINE KIT)  15 mL Mouth Rinse BID  . Chlorhexidine Gluconate Cloth  6 each Topical Daily  . clopidogrel  75 mg Per Tube Daily  . folic acid  1 mg Per Tube Daily  . free water  200 mL Per Tube Q4H  . gabapentin  300 mg Per Tube Daily  . heparin  5,000 Units Subcutaneous Q8H  . insulin aspart  0-9 Units Subcutaneous Q4H  .  insulin aspart  5 Units Subcutaneous Q4H  . insulin glargine  10 Units Subcutaneous Daily  . ipratropium-albuterol  3 mL Nebulization Q4H  . lithium carbonate  300 mg Oral BID WC  . mouth rinse  15 mL Mouth Rinse 10 times per day  . methylPREDNISolone (SOLU-MEDROL) injection  20 mg Intravenous Q12H  . multivitamin with minerals  1 tablet Per Tube Daily  . Oxcarbazepine  300 mg Per Tube QHS  . polyethylene glycol  17 g Per Tube Daily  . QUEtiapine  100 mg Per Tube QHS  . QUEtiapine  50 mg Per Tube QPC breakfast  . senna  1 tablet Per Tube QHS  . sodium chloride flush  10-40 mL Intracatheter Q12H  . sodium zirconium cyclosilicate  5 g Per Tube BID  . thiamine  100 mg Oral Daily   Or  . thiamine  100 mg Intravenous Daily   Continuous Infusions: . amiodarone 30 mg/hr (03/27/20 0900)  . dexmedetomidine (PRECEDEX) IV infusion Stopped (03/24/20 0801)  . feeding supplement (VITAL AF 1.2 CAL) 1,000 mL (03/26/20 1544)  . fentaNYL infusion INTRAVENOUS Stopped (03/26/20 0937)   PRN Meds:.acetaminophen, hydrALAZINE, labetalol, sodium chloride flush  Best Practice/Protocols:  VTE Prophylaxis: Heparin (SQ) GI Prophylaxis: Proton Pump Inhibitor Sepsis (date>>>6/25)  Consults: Treatment Team:  Pccm, Ander Gaster, MD Teodoro Spray, MD    Studies: CT ABDOMEN PELVIS WO CONTRAST  Result Date: 03/20/2020 CLINICAL DATA:  Periumbilical abdominal pain. EXAM: CT ABDOMEN AND PELVIS WITHOUT CONTRAST TECHNIQUE: Multidetector CT imaging of the abdomen and pelvis was performed following the standard protocol without IV contrast. COMPARISON:  None. FINDINGS: Lower chest: Marked severity infiltrates are seen within the posterior aspect of the bilateral lower lobes, left greater than right. Chronic posterior right ninth and tenth rib fractures are seen. Hepatobiliary: No focal liver abnormality is seen. The gallbladder is markedly distended without evidence of gallstones, gallbladder wall thickening, or  biliary dilatation. Pancreas: Unremarkable. No pancreatic ductal dilatation or surrounding inflammatory changes. Spleen: Normal in size without focal abnormality. Adrenals/Urinary Tract: 3.1 cm x 1.5 cm and 1.5 cm x 1.2 cm low-attenuation left adrenal masses are seen. Kidneys are  normal, without renal calculi, focal lesion, or hydronephrosis. Bladder is unremarkable. Stomach/Bowel: Stomach is within normal limits. Appendix appears normal. No evidence of bowel wall thickening, distention, or inflammatory changes. Vascular/Lymphatic: There is marked severity calcification of the abdominal aorta. Bilateral common iliac artery and bilateral external iliac artery stents are seen. No enlarged abdominal or pelvic lymph nodes. Reproductive: Uterus and bilateral adnexa are unremarkable. Other: No abdominal wall hernia or abnormality. No abdominopelvic ascites. Musculoskeletal: Marked severity degenerative changes are seen at the level of L5-S1. IMPRESSION: 1. Marked severity bilateral lower lobe infiltrates, left greater than right. 2. Low-attenuation left adrenal masses which may represent adrenal adenomas. 3. Marked severity calcification of the abdominal aorta. 4. Bilateral common iliac artery and bilateral external iliac artery stents. 5. Marked severity degenerative changes at the level of L5-S1. Aortic Atherosclerosis (ICD10-I70.0). Electronically Signed   By: Virgina Norfolk M.D.   On: 03/20/2020 20:52   DG Chest 1 View  Result Date: 03/21/2020 CLINICAL DATA:  Abdominal distension EXAM: CHEST  1 VIEW COMPARISON:  Radiograph 03/20/2020, CT abdomen pelvis 03/20/2020, CT chest 11/25/2019 FINDINGS: Dual lead pacer pack overlies left chest wall with leads in stable position at the right atrium and cardiac apex. Telemetry leads and nasal cannula overlie the chest. Persistent bibasilar consolidative opacities. Suspect a trace left effusion. Some fissural and septal thickening is noted with increasing central venous  congestion and central cuffing. Could reflect superimposed edema. Remote posttraumatic deformity of the distal right clavicle. No acute osseous or soft tissue abnormality. The aorta is calcified. The remaining cardiomediastinal contours are unremarkable. IMPRESSION: 1. Persistent bibasilar consolidative opacities. 2. Increasing central venous congestion, cuffing and septal thickening. Likely developing interstitial edema. Electronically Signed   By: Lovena Le M.D.   On: 03/21/2020 02:15   DG Chest 2 View  Result Date: 03/20/2020 CLINICAL DATA:  Shortness of breath. EXAM: CHEST - 2 VIEW COMPARISON:  July 11, 2017 FINDINGS: There is a dual lead AICD. Mild to moderate severity areas of atelectasis and/or infiltrate are seen within the bilateral lung bases. There is a small left pleural effusion. No pneumothorax is identified. The heart size and mediastinal contours are within normal limits. The visualized skeletal structures are unremarkable. IMPRESSION: 1. Mild to moderate severity bibasilar atelectasis and/or infiltrate. 2. Small left pleural effusion. Electronically Signed   By: Virgina Norfolk M.D.   On: 03/20/2020 20:53   DG Abd 1 View  Result Date: 03/21/2020 CLINICAL DATA:  Abdominal distension EXAM: ABDOMEN - 1 VIEW COMPARISON:  CT abdomen pelvis 03/20/2020 FINDINGS: Persistent consolidative opacities in the lung bases. Trace left effusion. Cardiomediastinal contours are stable from prior. No high-grade obstructive bowel gas pattern is seen in the abdomen or pelvis. Moderate colonic stool burden. Air and stool projects over the rectal vault. Multiple vascular calcifications noted throughout the upper abdomen including over the renal pelves compatible with findings on CT. No suspicious calcifications. Vascular stenting of the iliac arteries. Multilevel degenerative changes in the spine, hips and pelvis. External rotation of the right hip limits evaluation of the femoral neck. If there is concern  for femoral abnormality, recommend dedicated imaging. IMPRESSION: 1. No high-grade obstructive bowel gas pattern in the abdomen or pelvis. 2. Moderate colonic stool burden. 3. Persistent consolidative opacities in the lung bases. Trace left effusion. Electronically Signed   By: Lovena Le M.D.   On: 03/21/2020 02:12   CT HEAD WO CONTRAST  Result Date: 03/27/2020 CLINICAL DATA:  Encephalopathy. EXAM: CT HEAD WITHOUT CONTRAST TECHNIQUE: Contiguous axial  images were obtained from the base of the skull through the vertex without intravenous contrast. COMPARISON:  None. FINDINGS: Brain: There is no evidence of acute infarct, intracranial hemorrhage, mass, midline shift, or extra-axial fluid collection. The ventricles and sulci are within normal limits for age. Hypodensities in the cerebral white matter bilaterally are nonspecific but compatible with mild chronic small vessel ischemic disease. Vascular: Calcified atherosclerosis at the skull base. No hyperdense vessel. Skull: No fracture suspicious osseous lesion. 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 Trace right mastoid fluid. Unremarkable included orbits. Other: None. IMPRESSION: 1. No evidence of acute intracranial abnormality. 2. Mild chronic small vessel ischemic disease. Electronically Signed   By: Logan Bores M.D.   On: 03/27/2020 12:02   DG Chest Port 1 View  Result Date: 03/25/2020 CLINICAL DATA:  Shortness of breath EXAM: PORTABLE CHEST 1 VIEW COMPARISON:  03/24/2020 FINDINGS: Cardiac shadow is stable. Pacing device is again seen. Endotracheal tube and gastric catheter are noted in satisfactory position. Right-sided PICC line is noted in satisfactory position. Patchy airspace opacities are noted but slightly improved when compared with the prior exam. No bony abnormality is noted. IMPRESSION: Slight improved aeration when compared with the prior study. Electronically Signed   By: Inez Catalina M.D.   On: 03/25/2020 10:36    DG Chest Port 1 View  Result Date: 03/24/2020 CLINICAL DATA:  Respiratory distress EXAM: PORTABLE CHEST 1 VIEW COMPARISON:  None. FINDINGS: Endotracheal tube essentially at the carina. Recommend retraction by 3 to 4 cm. NG tube in stomach. Normal cardiac silhouette. Diffuse airspace disease slightly increased in density. Small LEFT effusion unchanged IMPRESSION: 1. Endotracheal tube at carina.  Consider retraction by 3-4 cm. 2. Diffuse bilateral airspace disease slightly worse. These results will be called to the ordering clinician or representative by the Radiologist Assistant, and communication documented in the PACS or [employer]. Electronically Signed   By: Suzy Bouchard M.D.   On: 03/24/2020 08:58   DG Chest Port 1 View  Result Date: 03/22/2020 CLINICAL DATA:  Acute respiratory failure EXAM: PORTABLE CHEST 1 VIEW COMPARISON:  March 21, 2020 FINDINGS: Stable pacemaker. The cardiomediastinal silhouette is stable. No pneumothorax. Bilateral patchy pulmonary infiltrates have worsened in the interval. IMPRESSION: Increasing bilateral pulmonary infiltrates may represent diffuse multifocal pneumonia versus developing asymmetric edema. Recommend clinical correlation and short-term follow-up to ensure resolution. Electronically Signed   By: Dorise Bullion III M.D   On: 03/22/2020 07:58   ECHOCARDIOGRAM COMPLETE  Result Date: 03/24/2020    ECHOCARDIOGRAM REPORT   Patient Name:   VELEKA DJORDJEVIC Date of Exam: 03/23/2020 Medical Rec #:  [id number]      Height:       66.0 in Accession #:    [id number]     Weight:       141.5 lb Date of Birth:  08-Dec-1955      BSA:          1.727 m Patient Age:    60 years       BP:           148/62 mmHg Patient Gender: F              HR:           82 bpm. Exam Location:  [facility] Procedure: 2D Echo, Cardiac Doppler and Color Doppler Indications:     Acute Respiratory Insufficiency 518.82 / R06.89  History:         Patient has prior history of  Echocardiogram examinations.  Risk                  Factors:Hypertension. 2nd degree heart block.  Sonographer:     Alyse Low Roar Referring Phys:  [id number] Flora Lipps Diagnosing Phys: Isaias Cowman MD IMPRESSIONS  1. Left ventricular ejection fraction, by estimation, is 50 to 55%. The left ventricle has low normal function. The left ventricle has no regional wall motion abnormalities. There is mild left ventricular hypertrophy. Left ventricular diastolic parameters were normal.  2. Right ventricular systolic function is normal. The right ventricular size is normal. There is moderately elevated pulmonary artery systolic pressure.  3. The mitral valve is normal in structure. Mild mitral valve regurgitation. No evidence of mitral stenosis.  4. The aortic valve is normal in structure. Aortic valve regurgitation is mild to moderate. No aortic stenosis is present.  5. The inferior vena cava is normal in size with greater than 50% respiratory variability, suggesting right atrial pressure of 3 mmHg. FINDINGS  Left Ventricle: Left ventricular ejection fraction, by estimation, is 50 to 55%. The left ventricle has low normal function. The left ventricle has no regional wall motion abnormalities. The left ventricular internal cavity size was normal in size. There is mild left ventricular hypertrophy. Left ventricular diastolic parameters were normal. Right Ventricle: The right ventricular size is normal. No increase in right ventricular wall thickness. Right ventricular systolic function is normal. There is moderately elevated pulmonary artery systolic pressure. The tricuspid regurgitant velocity is 3.07 m/s, and with an assumed right atrial pressure of 10 mmHg, the estimated right ventricular systolic pressure is 65.7 mmHg. Left Atrium: Left atrial size was normal in size. Right Atrium: Right atrial size was normal in size. Pericardium: There is no evidence of pericardial effusion. Mitral Valve: The mitral valve is normal in structure. Normal mobility  of the mitral valve leaflets. Mild mitral valve regurgitation. No evidence of mitral valve stenosis. Tricuspid Valve: The tricuspid valve is normal in structure. Tricuspid valve regurgitation is mild . No evidence of tricuspid stenosis. Aortic Valve: The aortic valve is normal in structure. Aortic valve regurgitation is mild to moderate. Aortic regurgitation PHT measures 410 msec. No aortic stenosis is present. Aortic valve mean gradient measures 6.0 mmHg. Aortic valve peak gradient measures 13.8 mmHg. Aortic valve area, by VTI measures 1.95 cm. Pulmonic Valve: The pulmonic valve was normal in structure. Pulmonic valve regurgitation is not visualized. No evidence of pulmonic stenosis. Aorta: The aortic root is normal in size and structure. Venous: The inferior vena cava is normal in size with greater than 50% respiratory variability, suggesting right atrial pressure of 3 mmHg. IAS/Shunts: No atrial level shunt detected by color flow Doppler.  LEFT VENTRICLE PLAX 2D LVIDd:         4.40 cm  Diastology LVIDs:         3.13 cm  LV e' lateral:   13.90 cm/s LV PW:         1.21 cm  LV E/e' lateral: 5.9 LV IVS:        1.30 cm  LV e' medial:    10.60 cm/s LVOT diam:     1.70 cm  LV E/e' medial:  7.8 LV SV:         64 LV SV Index:   37 LVOT Area:     2.27 cm  RIGHT VENTRICLE RV Mid diam:    3.04 cm RV S prime:     15.40 cm/s LEFT ATRIUM  Index       RIGHT ATRIUM           Index LA diam:        3.55 cm 2.06 cm/m  RA Area:     14.50 cm LA Vol (A2C):   45.5 ml 26.35 ml/m RA Volume:   35.40 ml  20.50 ml/m LA Vol (A4C):   40.9 ml 23.69 ml/m LA Biplane Vol: 44.2 ml 25.60 ml/m  AORTIC VALVE                    PULMONIC VALVE AV Area (Vmax):    1.93 cm     PV Vmax:        1.18 m/s AV Area (Vmean):   1.88 cm     PV Peak grad:   5.6 mmHg AV Area (VTI):     1.95 cm     RVOT Peak grad: 2 mmHg AV Vmax:           186.00 cm/s AV Vmean:          109.000 cm/s AV VTI:            0.330 m AV Peak Grad:      13.8 mmHg AV Mean  Grad:      6.0 mmHg LVOT Vmax:         158.00 cm/s LVOT Vmean:        90.200 cm/s LVOT VTI:          0.283 m LVOT/AV VTI ratio: 0.86 AI PHT:            410 msec  AORTA Ao Root diam: 2.50 cm MITRAL VALVE               TRICUSPID VALVE MV Area (PHT): 3.23 cm    TR Peak grad:   37.7 mmHg MV Decel Time: 235 msec    TR Vmax:        307.00 cm/s MV E velocity: 82.60 cm/s MV A velocity: 70.00 cm/s  SHUNTS MV E/A ratio:  1.18        Systemic VTI:  0.28 m MV A Prime:    10.2 cm/s   Systemic Diam: 1.70 cm Isaias Cowman MD Electronically signed by Isaias Cowman MD Signature Date/Time: 03/24/2020/7:47:47 AM    Final    [place] EKG SITE RITE  Result Date: 03/23/2020 If Site Rite image not attached, placement could not be confirmed due to current cardiac rhythm.   Subjective:    Overnight Issues: Remains intubated and unresponsive. No sedated  PHYSICAL EXAM  Vital signs for last 24 hours: Temp:  [99.6 F (37.6 C)-102.2 F (39 C)] 101.2 F (38.4 C) (07/02 0800) Pulse Rate:  [54-151] 54 (07/02 0900) Resp:  [20-30] 28 (07/02 0900) BP: (109-200)/(38-91) 146/48 (07/02 0900) SpO2:  [92 %-97 %] 95 % (07/02 0900) FiO2 (%):  [35 %] 35 % (07/02 0734) Weight:  [64.4 kg] 64.4 kg (07/02 0354)  Intake/Output from previous day: 07/01 0701 - 07/02 0700 In: 1462.9 [I.V.:432.8; NG/GT:1030.1] Out: 1675 [Urine:1675]  Intake/Output this shift: Total I/O In: 44.6 [I.V.:44.6] Out: 325 [Urine:325]  Vent settings for last 24 hours: Vent Mode: PRVC FiO2 (%):  [35 %] 35 % Set Rate:  [20 bmp] 20 bmp Vt Set:  [450 mL] 450 mL PEEP:  [5 cmH20] 5 cmH20 Plateau Pressure:  [18 cmH20-21 cmH20] 18 cmH20  GENERAL: 64 year old patient lying in the bed on mechanical ventillation EYES:  No scleral icterus. Extraocular muscles intact.  HEENT: Head atraumatic, normocephalic. Oropharynx and nasopharynx clear.  NECK:  Supple, no jugular venous distention. No thyroid enlargement, no tenderness.  LUNGS: Decreased breath  sounds bilaterally, no wheezing, rales,rhonchi or crepitation. No use of accessory muscles of respiration.  CARDIOVASCULAR: S1, S2 normal. No murmurs, rubs, or gallops.  ABDOMEN: Soft, nontender, nondistended. Bowel sounds present. No organomegaly or mass.  EXTREMITIES: Generalized dependent edema, cyanosis, or clubbing.  FOCUSED NEUROLOGIC:Patient does not respond to verbal stimuli.  Does not respond to deep sternal rub.  Does not follow commands.  No verbalizations noted.  Cranial Nerves: II: patient does not respond confrontation bilaterally, pupils right 2 mm, left 2 mm,and minimally bilaterally III,IV,VI: doll's response absent bilaterally.  V,VII: corneal reflex absent bilaterally  VIII: patient does not respond to verbal stimuli IX,X: gag reflex reduced, XI: trapezius strength unable to test bilaterally XII: tongue strength unable to test Motor: Extremities flaccid throughout.  No spontaneous movement noted.  No purposeful movements noted. Sensory: Does not respond to noxious stimuli in any extremity. Deep Tendon Reflexes:  Absent throughout. Plantars: absent bilaterally Cerebellar: Unable to perform  PSYCHIATRIC: Unable to assess.  SKIN: No obvious rash, lesion, or ulcer.   Results for orders placed or performed during the hospital encounter of 03/20/20 (from the past 24 hour(s))  Glucose, capillary     Status: Abnormal   Collection Time: 03/26/20  3:34 PM  Result Value Ref Range   Glucose-Capillary 182 (H) 70 - 99 mg/dL  Glucose, capillary     Status: Abnormal   Collection Time: 03/26/20  7:31 PM  Result Value Ref Range   Glucose-Capillary 162 (H) 70 - 99 mg/dL  Glucose, capillary     Status: Abnormal   Collection Time: 03/26/20 11:52 PM  Result Value Ref Range   Glucose-Capillary 142 (H) 70 - 99 mg/dL  Glucose, capillary     Status: Abnormal   Collection Time: 03/27/20  3:46 AM  Result Value Ref Range   Glucose-Capillary 189 (H) 70 - 99 mg/dL  Basic metabolic panel      Status: Abnormal   Collection Time: 03/27/20  4:35 AM  Result Value Ref Range   Sodium 150 (H) 135 - 145 mmol/L   Potassium 5.4 (H) 3.5 - 5.1 mmol/L   Chloride 117 (H) 98 - 111 mmol/L   CO2 27 22 - 32 mmol/L   Glucose, Bld 265 (H) 70 - 99 mg/dL   BUN 61 (H) 8 - 23 mg/dL   Creatinine, Ser 1.31 (H) 0.44 - 1.00 mg/dL   Calcium 10.0 8.9 - 10.3 mg/dL   GFR calc non Af Amer 43 (L) >60 mL/min   GFR calc Af Amer 49 (L) >60 mL/min   Anion gap 6 5 - 15  CBC with Differential/Platelet     Status: Abnormal   Collection Time: 03/27/20  4:35 AM  Result Value Ref Range   WBC 14.4 (H) 4.0 - 10.5 K/uL   RBC 4.31 3.87 - 5.11 MIL/uL   Hemoglobin 12.8 12.0 - 15.0 g/dL   HCT 43.6 36 - 46 %   MCV 101.2 (H) 80.0 - 100.0 fL   MCH 29.7 26.0 - 34.0 pg   MCHC 29.4 (L) 30.0 - 36.0 g/dL   RDW 16.7 (H) 11.5 - 15.5 %   Platelets 153 150 - 400 K/uL   nRBC 0.0 0.0 - 0.2 %   Neutrophils Relative % 90 %   Neutro Abs 13.0 (H) 1.7 - 7.7 K/uL   Lymphocytes Relative 6 %  Lymphs Abs 0.8 0.7 - 4.0 K/uL   Monocytes Relative 2 %   Monocytes Absolute 0.3 0 - 1 K/uL   Eosinophils Relative 0 %   Eosinophils Absolute 0.0 0 - 0 K/uL   Basophils Relative 0 %   Basophils Absolute 0.0 0 - 0 K/uL   Immature Granulocytes 2 %   Abs Immature Granulocytes 0.34 (H) 0.00 - 0.07 K/uL  Magnesium     Status: Abnormal   Collection Time: 03/27/20  4:35 AM  Result Value Ref Range   Magnesium 2.5 (H) 1.7 - 2.4 mg/dL  Phosphorus     Status: None   Collection Time: 03/27/20  4:35 AM  Result Value Ref Range   Phosphorus 3.1 2.5 - 4.6 mg/dL  Glucose, capillary     Status: Abnormal   Collection Time: 03/27/20  7:28 AM  Result Value Ref Range   Glucose-Capillary 250 (H) 70 - 99 mg/dL  Ammonia     Status: Abnormal   Collection Time: 03/27/20 10:57 AM  Result Value Ref Range   Ammonia 51 (H) 9 - 35 umol/L  Glucose, capillary     Status: Abnormal   Collection Time: 03/27/20 11:57 AM  Result Value Ref Range   Glucose-Capillary  179 (H) 70 - 99 mg/dL     ASSESSMENT/PLAN   Acute Hypoxic Respiratory Failure secondary to  Pneumonia - COPD with evidence of acute exacerbation - Full vent support for now-vent settings reviewed and established  - SBT once all parameters met  - VAP bundle implemented -Trend WBC and monitor fever curve - - Repeat CXR on 6/30 shows slight improved aeration  - Follow intermittent ABG and chest x-ray as needed - As needed bronchodilators  Sepsis secondary to pneumonia + UTI -Trend WBCs,Lactic and procalcitonin - Blood and urine cultures pending - Continue Abx with Ceftriaxone and Azithromycin - IVFs and PRN bolus to keep MAP<83mHg or SBP <940mg  Atrial Fibrillation with RVR -  Hx: Second degree heart block s/p pacemaker - Rate controlled on Amiodarone - Cardiology input appreciated   Acute Kidney Injury - Improving - Monitor I&O's / urinary output - Follow BMP - Ensure adequate renal perfusion - Avoid nephrotoxic agents as able - Replace electrolytes as indicated  Acute toxicmetabolicencephalopathy Mechanical Intubation pain/discomfort Hx: Bipolar Disorder,TBI - Maintain RASS goal -1 to -2 - Continue Lithium, Seroquel - Repeat CT head - Ammonia levels elevated will start on Lactulose  Seizure Disorder - Continue Trilepta, Gabapentin  - Seizure precautions - PRN Ativan for seizure     ElRufina FalcoDNP, CCRN, FNP-BC [place]

## 2020-03-28 DIAGNOSIS — R400 Somnolence: Secondary | ICD-10-CM

## 2020-03-28 DIAGNOSIS — J189 Pneumonia, unspecified organism: Secondary | ICD-10-CM | POA: Diagnosis not present

## 2020-03-28 LAB — CBC WITH DIFFERENTIAL/PLATELET
Abs Immature Granulocytes: 0.17 10*3/uL — ABNORMAL HIGH (ref 0.00–0.07)
Basophils Absolute: 0 10*3/uL (ref 0.0–0.1)
Basophils Relative: 0 %
Eosinophils Absolute: 0 10*3/uL (ref 0.0–0.5)
Eosinophils Relative: 0 %
HCT: 43.2 % (ref 36.0–46.0)
Hemoglobin: 12.9 g/dL (ref 12.0–15.0)
Immature Granulocytes: 1 %
Lymphocytes Relative: 7 %
Lymphs Abs: 0.9 10*3/uL (ref 0.7–4.0)
MCH: 29.9 pg (ref 26.0–34.0)
MCHC: 29.9 g/dL — ABNORMAL LOW (ref 30.0–36.0)
MCV: 100 fL (ref 80.0–100.0)
Monocytes Absolute: 0.3 10*3/uL (ref 0.1–1.0)
Monocytes Relative: 3 %
Neutro Abs: 11 10*3/uL — ABNORMAL HIGH (ref 1.7–7.7)
Neutrophils Relative %: 89 %
Platelets: 144 10*3/uL — ABNORMAL LOW (ref 150–400)
RBC: 4.32 MIL/uL (ref 3.87–5.11)
RDW: 16.2 % — ABNORMAL HIGH (ref 11.5–15.5)
WBC: 12.4 10*3/uL — ABNORMAL HIGH (ref 4.0–10.5)
nRBC: 0 % (ref 0.0–0.2)

## 2020-03-28 LAB — GLUCOSE, CAPILLARY
Glucose-Capillary: 222 mg/dL — ABNORMAL HIGH (ref 70–99)
Glucose-Capillary: 239 mg/dL — ABNORMAL HIGH (ref 70–99)
Glucose-Capillary: 215 mg/dL — ABNORMAL HIGH (ref 70–99)
Glucose-Capillary: 196 mg/dL — ABNORMAL HIGH (ref 70–99)
Glucose-Capillary: 161 mg/dL — ABNORMAL HIGH (ref 70–99)

## 2020-03-28 LAB — COMPREHENSIVE METABOLIC PANEL
ALT: 34 U/L (ref 0–44)
AST: 11 U/L — ABNORMAL LOW (ref 15–41)
Albumin: 2.4 g/dL — ABNORMAL LOW (ref 3.5–5.0)
Alkaline Phosphatase: 56 U/L (ref 38–126)
Anion gap: 5 (ref 5–15)
BUN: 62 mg/dL — ABNORMAL HIGH (ref 8–23)
CO2: 31 mmol/L (ref 22–32)
Calcium: 9.6 mg/dL (ref 8.9–10.3)
Chloride: 111 mmol/L (ref 98–111)
Creatinine, Ser: 1.19 mg/dL — ABNORMAL HIGH (ref 0.44–1.00)
GFR calc Af Amer: 55 mL/min — ABNORMAL LOW (ref >60)
GFR calc non Af Amer: 48 mL/min — ABNORMAL LOW (ref >60)
Glucose, Bld: 251 mg/dL — ABNORMAL HIGH (ref 70–99)
Potassium: 4.8 mmol/L (ref 3.5–5.1)
Sodium: 147 mmol/L — ABNORMAL HIGH (ref 135–145)
Total Bilirubin: 0.7 mg/dL (ref 0.3–1.2)
Total Protein: 6.7 g/dL (ref 6.5–8.1)

## 2020-03-28 LAB — CULTURE, RESPIRATORY W GRAM STAIN
Culture: NO GROWTH
Gram Stain: NONE SEEN
Special Requests: NORMAL

## 2020-03-28 LAB — LITHIUM LEVEL: Lithium Lvl: 0.69 mmol/L (ref 0.60–1.20)

## 2020-03-28 LAB — AMMONIA: Ammonia: 29 umol/L (ref 9–35)

## 2020-03-28 LAB — PHOSPHORUS: Phosphorus: 4 mg/dL (ref 2.5–4.6)

## 2020-03-28 LAB — MAGNESIUM: Magnesium: 2.4 mg/dL (ref 1.7–2.4)

## 2020-03-28 MED ORDER — QUETIAPINE FUMARATE 25 MG PO TABS: 25.0000 mg | ORAL_TABLET | Freq: Every day | ORAL | Status: DC

## 2020-03-28 MED ORDER — OXCARBAZEPINE 150 MG PO TABS
150.0000 mg | ORAL_TABLET | Freq: Every day | ORAL | Status: DC
  Administered 2020-03-28: 150 mg
  Filled 2020-03-28: qty 1

## 2020-03-28 NOTE — Progress Notes (Signed)
[facility] Cardiology    SUBJECTIVE: Patient intubated sedated   Vitals:   03/28/20 0410 03/28/20 0444 03/28/20 0500 03/28/20 0600  BP:   (!) 123/44 (!) 116/44  Pulse:   (!) 55 (!) 55  Resp:   (!) 22 (!) 22  Temp:      TempSrc:      SpO2: 96%  96% 96%  Weight:  63.8 kg    Height:         Intake/Output Summary (Last 24 hours) at 03/28/2020 1204 Last data filed at 03/28/2020 0600 Gross per 24 hour  Intake 1688.88 ml  Output 2025 ml  Net -336.12 ml      PHYSICAL EXAM  General: Well developed, well nourished, in no acute distress intubated sedated HEENT:  Normocephalic and atramatic Neck:  No JVD.  Lungs: Clear bilaterally to auscultation and percussion. Heart: HRRR . Normal S1 and S2 without gallops or murmurs.  Abdomen: Bowel sounds are positive, abdomen soft and non-tender  Msk:  Back normal, normal gait. Normal strength and tone for age. Extremities: No clubbing, cyanosis or edema.   Neuro: Alert and oriented X 3. Psych:  Good affect, responds appropriately   LABS: Basic Metabolic Panel: Recent Labs    03/27/20 0435 03/28/20 0425  NA 150* 147*  K 5.4* 4.8  CL 117* 111  CO2 27 31  GLUCOSE 265* 251*  BUN 61* 62*  CREATININE 1.31* 1.19*  CALCIUM 10.0 9.6  MG 2.5* 2.4  PHOS 3.1 4.0   Liver Function Tests: Recent Labs    03/28/20 0425  AST 11*  ALT 34  ALKPHOS 56  BILITOT 0.7  PROT 6.7  ALBUMIN 2.4*   No results for input(s): LIPASE, AMYLASE in the last 72 hours. CBC: Recent Labs    03/27/20 0435 03/28/20 0425  WBC 14.4* 12.4*  NEUTROABS 13.0* 11.0*  HGB 12.8 12.9  HCT 43.6 43.2  MCV 101.2* 100.0  PLT 153 144*   Cardiac Enzymes: No results for input(s): CKTOTAL, CKMB, CKMBINDEX, TROPONINI in the last 72 hours. BNP: Invalid input(s): POCBNP D-Dimer: No results for input(s): DDIMER in the last 72 hours. Hemoglobin A1C: No results for input(s): HGBA1C in the last 72 hours. Fasting Lipid Panel: No results for input(s): CHOL, HDL, LDLCALC, TRIG,  CHOLHDL, LDLDIRECT in the last 72 hours. Thyroid Function Tests: No results for input(s): TSH, T4TOTAL, T3FREE, THYROIDAB in the last 72 hours.  Invalid input(s): FREET3 Anemia Panel: No results for input(s): VITAMINB12, FOLATE, FERRITIN, TIBC, IRON, RETICCTPCT in the last 72 hours.  CT HEAD WO CONTRAST  Result Date: 03/27/2020 CLINICAL DATA:  Encephalopathy. EXAM: CT HEAD WITHOUT CONTRAST TECHNIQUE: Contiguous axial images were obtained from the base of the skull through the vertex without intravenous contrast. COMPARISON:  None. FINDINGS: Brain: There is no evidence of acute infarct, intracranial hemorrhage, mass, midline shift, or extra-axial fluid collection. The ventricles and sulci are within normal limits for age. Hypodensities in the cerebral white matter bilaterally are nonspecific but compatible with mild chronic small vessel ischemic disease. Vascular: Calcified atherosclerosis at the skull base. No hyperdense vessel. Skull: No fracture suspicious osseous lesion. 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 Trace right mastoid fluid. Unremarkable included orbits. Other: None. IMPRESSION: 1. No evidence of acute intracranial abnormality. 2. Mild chronic small vessel ischemic disease. Electronically Signed   By: Logan Bores M.D.   On: 03/27/2020 12:02     Echo ejection fraction between 50 and 55%  TELEMETRY: Normal sinus rhythm rate of  55 nonspecific ST-T wave changes  ASSESSMENT AND PLAN:  Principal Problem:   CAP (community acquired pneumonia) Active Problems:   Essential hypertension, benign   Traumatic brain injury ([place])   Seizures ([place])   Bipolar affective disorder ([place])   Artificial cardiac pacemaker   COPD with acute exacerbation ([place])   AKI (acute kidney injury) ([place])   Acute respiratory failure ([place])   Plan Continue therapy for respiratory failure critical care Continue hypertension management control Agree with antibiotic therapy for  pneumonia Control atrial fibrillation which is converted to sinus Continue ventilatory support for respiratory failure Agree with statin therapy for lipid management Continue antiseizure medication including Tegretol and Seroquel Continue medical therapy from a cardiac standpoint Recommend nephrology input for acute on chronic renal insufficiency  Yolonda Kida, MD 03/28/2020 12:04 PM

## 2020-03-28 NOTE — Progress Notes (Signed)
eeg completed ° °

## 2020-03-28 NOTE — Progress Notes (Addendum)
Follow up - Critical Care Medicine Note  BRIEF PATIENT DESCRIPTION:    Cynthia [name] is an 64 y.o. female with PMH of COPD, Seizure disorder, second-degree heart block s/p PPM placement, PVD s/p multiple stent placements (11/2019), h/o CVA, Emphysema, HTN, HLD,TBI with residual memory deficit, seizures, chronic hepatitis C, bipolar disorder, h/o substance abuse, PTSD and tobacco use who was admitted with acute hypoxic respiratory failure secondary to CAP and COPD with acute exacerbation.    SIGNIFICANT EVENTS  6/25- Admitted to medsurg unit 6/26- Worsening acute hypoxic respiratory failure and Afib with RVR HR 173 transferred to stepdown unit. Amiodarone started, requiring increased oxygen after arrival to ICU and placed on Bipap FIO2 at 45% 6/27- Continued Bipap, HFNC 6/28-transferred to PCCM service for increased WOB and agitation with biPAP and high flow [place] 6/28-PICC line placed, started precedex high folow [place] 50%/50L 6/29-Patient required placement of an artificial airway secondary to Respiratory Failure 7/2-Repeat CT head showed no acute intracranial abnormality. Unable to obtain MRI brain due to pacemaker. 7/3- EEG. Neurology consulted for prolonged encephalopathy  CULTURES: SARS-CoV-2 PCR 6/25>> Negative MRSA PCR 6/26> Negative Blood culture x2 6/26>>No growth x 5 days  ANTIBIOTICS: Azithromycin 6/26>>completed Ceftriaxone 6/26>completed Cefepime 6/26> STOPPED   Lines/tubes : Airway 7.5 mm (Active)  Secured at (cm) 22 cm 03/27/20 0734  Measured From Lips 03/27/20 0734  Secured Location Left 03/27/20 0734  Secured By [employer] 03/27/20 0734  Tube Holder Repositioned Yes 03/27/20 0734  Cuff Pressure (cm H2O) 25 cm H2O 03/27/20 0734  Site Condition Dry 03/27/20 0734     PICC Triple Lumen 03/23/20 PICC Right Brachial 35 cm 0 cm (Active)  Indication for Insertion or Continuance of Line Prolonged intravenous therapies 03/27/20 0900  Exposed Catheter (cm) 0 cm  03/24/20 0200  Site Assessment Clean;Dry;Intact 03/27/20 0900  Lumen #1 Status Infusing 03/27/20 0900  Lumen #2 Status Saline locked 03/27/20 0900  Lumen #3 Status Saline locked 03/27/20 0900  Dressing Type Transparent 03/27/20 0900  Dressing Status Clean;Dry;Intact;Antimicrobial disc in place 03/27/20 0900  Safety Lock Not Applicable 83/66/29 4765  Line Care Connections checked and tightened 03/27/20 0900  Line Adjustment (NICU/IV Team Only) No 03/23/20 1300  Dressing Intervention New dressing 03/23/20 1300  Dressing Change Due 03/30/20 03/27/20 0900     NG/OG Tube Orogastric Xray Documented cm marking at nare/ corner of mouth 66 cm (Active)  Cm Marking at Nare/Corner of Mouth (if applicable) 65 cm 46/50/35 0347  Site Assessment Clean;Dry 03/27/20 0347  Ongoing Placement Verification No change in cm markings or external length of tube from initial placement 03/27/20 0347  Status Infusing tube feed 03/27/20 0347  Intake (mL) 30 mL 03/26/20 1700  Output (mL) 0 mL 03/26/20 1600     Urethral Catheter Lauren B. RN Non-latex 14 Fr. (Active)  Indication for Insertion or Continuance of Catheter Unstable spinal/crush injuries / Multisystem Trauma 03/27/20 0731  Site Assessment Intact;Clean 03/27/20 0739  Catheter Maintenance Bag below level of bladder;Catheter secured;Drainage bag/tubing not touching floor;No dependent loops 03/27/20 0739  Collection Container Standard drainage bag 03/27/20 0739  Securement Method Securing device (Describe) 03/27/20 0739  Urinary Catheter Interventions (if applicable) Unclamped 46/56/81 0739  Output (mL) 325 mL 03/27/20 0800    Microbiology/Sepsis markers: Results for orders placed or performed during the hospital encounter of 03/20/20  SARS Coronavirus 2 by RT PCR (hospital order, performed in [facility] hospital lab) Nasopharyngeal Nasopharyngeal Swab     Status: None   Collection Time: 03/20/20  8:19 PM  Specimen: Nasopharyngeal Swab  Result Value  Ref Range Status   SARS Coronavirus 2 NEGATIVE NEGATIVE Final    Comment: (NOTE) SARS-CoV-2 target nucleic acids are NOT DETECTED.  The SARS-CoV-2 RNA is generally detectable in upper and lower respiratory specimens during the acute phase of infection. The lowest concentration of SARS-CoV-2 viral copies this assay can detect is 250 copies / mL. A negative result does not preclude SARS-CoV-2 infection and should not be used as the sole basis for treatment or other patient management decisions.  A negative result may occur with improper specimen collection / handling, submission of specimen other than nasopharyngeal swab, presence of viral mutation(s) within the areas targeted by this assay, and inadequate number of viral copies (<250 copies / mL). A negative result must be combined with clinical observations, patient history, and epidemiological information.  Fact Sheet for Patients:   StrictlyIdeas.no  Fact Sheet for Healthcare Providers: BankingDealers.co.za  This test is not yet approved or  cleared by the [place] FDA and has been authorized for detection and/or diagnosis of SARS-CoV-2 by FDA under an Emergency Use Authorization (EUA).  This EUA will remain in effect (meaning this test can be used) for the duration of the COVID-19 declaration under Section 564(b)(1) of the Act, 21 U.S.C. section 360bbb-3(b)(1), unless the authorization is terminated or revoked sooner.  Performed at [facility], [address] [place], [place] [address]   Blood Culture (routine x 2)     Status: None   Collection Time: 03/20/20  9:17 PM   Specimen: BLOOD  Result Value Ref Range Status   Specimen Description BLOOD LEFT FOREARM  Final   Special Requests   Final    BOTTLES DRAWN AEROBIC AND ANAEROBIC Blood Culture results may not be optimal due to an inadequate volume of blood received in culture bottles   Culture   Final    NO  GROWTH 5 DAYS Performed at [facility], [address] [place], [place] [address]    Report Status 03/25/2020 FINAL  Final  Blood Culture (routine x 2)     Status: None   Collection Time: 03/20/20 10:11 PM   Specimen: BLOOD  Result Value Ref Range Status   Specimen Description BLOOD RIGHT FOREARM  Final   Special Requests   Final    BOTTLES DRAWN AEROBIC AND ANAEROBIC Blood Culture results may not be optimal due to an inadequate volume of blood received in culture bottles   Culture   Final    NO GROWTH 5 DAYS Performed at [facility], [address] [place], [place] [address]    Report Status 03/25/2020 FINAL  Final  MRSA PCR Screening     Status: None   Collection Time: 03/21/20  2:07 AM   Specimen: Nasal Mucosa; Nasopharyngeal  Result Value Ref Range Status   MRSA by PCR NEGATIVE NEGATIVE Final    Comment:        The GeneXpert MRSA Assay (FDA approved for NASAL specimens only), is one component of a comprehensive MRSA colonization surveillance program. It is not intended to diagnose MRSA infection nor to guide or monitor treatment for MRSA infections. Performed at [facility], [address] [place], [place] [address]   SARS Coronavirus 2 by RT PCR (hospital order, performed in [facility] hospital lab)     Status: None   Collection Time: 03/23/20 10:12 AM  Result Value Ref Range Status   SARS Coronavirus 2 NEGATIVE NEGATIVE Final    Comment: (  NOTE) SARS-CoV-2 target nucleic acids are NOT DETECTED.  The SARS-CoV-2 RNA is generally detectable in upper and lower respiratory specimens during the acute phase of infection. The lowest concentration of SARS-CoV-2 viral copies this assay can detect is 250 copies / mL. A negative result does not preclude SARS-CoV-2 infection and should not be used as the sole basis for treatment or other patient management decisions.  A negative result may occur with improper specimen collection / handling,  submission of specimen other than nasopharyngeal swab, presence of viral mutation(s) within the areas targeted by this assay, and inadequate number of viral copies (<250 copies / mL). A negative result must be combined with clinical observations, patient history, and epidemiological information.  Fact Sheet for Patients:   StrictlyIdeas.no  Fact Sheet for Healthcare Providers: BankingDealers.co.za  This test is not yet approved or  cleared by the [place] FDA and has been authorized for detection and/or diagnosis of SARS-CoV-2 by FDA under an Emergency Use Authorization (EUA).  This EUA will remain in effect (meaning this test can be used) for the duration of the COVID-19 declaration under Section 564(b)(1) of the Act, 21 U.S.C. section 360bbb-3(b)(1), unless the authorization is terminated or revoked sooner.  Performed at [facility], [address] [place], [place] [address]   Culture, respiratory (non-expectorated)     Status: None   Collection Time: 03/24/20  4:39 PM   Specimen: Tracheal Aspirate; Respiratory  Result Value Ref Range Status   Specimen Description   Final    TRACHEAL ASPIRATE Performed at [facility], [address] [place], [place] [address]    Special Requests   Final    NONE Performed at [facility], [address] [place], [place] [address]    Gram Stain   Final    RARE WBC PRESENT, PREDOMINANTLY PMN FEW SQUAMOUS EPITHELIAL CELLS PRESENT NO ORGANISMS SEEN    Culture   Final    NO GROWTH Performed at [facility], [place] [address] [place], [place] [address]    Report Status 03/25/2020 FINAL  Final  Expectorated sputum assessment w rflx to resp cult     Status: None   Collection Time: 03/25/20 11:21 AM   Specimen: Sputum  Result Value Ref Range Status   Specimen Description SPUTUM  Final   Special Requests Normal  Final   Sputum evaluation   Final     THIS SPECIMEN IS ACCEPTABLE FOR SPUTUM CULTURE Performed at [facility], [address], [place], [place] [address]    Report Status 03/25/2020 FINAL  Final  Culture, respiratory     Status: None (Preliminary result)   Collection Time: 03/25/20 11:21 AM   Specimen: SPU  Result Value Ref Range Status   Specimen Description   Final    SPUTUM Performed at [facility], [address], [place], [place] [address]    Special Requests   Final    Normal Reflexed from [id number] Performed at [facility], [place], [place] [address]    Gram Stain NO WBC SEEN NO ORGANISMS SEEN   Final   Culture   Final    NO GROWTH 2 DAYS Performed at [facility], [place] [address], [place], [place] [address]    Report Status PENDING  Incomplete    Anti-infectives:  Anti-infectives (From admission, onward)   Start     Dose/Rate Route Frequency Ordered Stop   03/21/20 1200  azithromycin (ZITHROMAX) tablet 500 mg  500 mg Oral Daily 03/21/20 0047 03/26/20 0959   03/21/20 0900  cefTRIAXone (ROCEPHIN) 2 g in sodium chloride 0.9 % 100 mL IVPB        2 g 200 mL/hr over 30 Minutes Intravenous Every 24 hours 03/21/20 0047 03/25/20 1051   03/20/20 2130  ceFEPIme (MAXIPIME) 2 g in sodium chloride 0.9 % 100 mL IVPB        2 g 200 mL/hr over 30 Minutes Intravenous  Once 03/20/20 2117 03/20/20 2246   03/20/20 2130  azithromycin (ZITHROMAX) 500 mg in sodium chloride 0.9 % 250 mL IVPB        500 mg 250 mL/hr over 60 Minutes Intravenous  Once 03/20/20 2117 03/20/20 2325     Scheduled Meds: . amLODipine  5 mg Per Tube Daily  . budesonide (PULMICORT) nebulizer solution  0.5 mg Nebulization BID  . chlorhexidine gluconate (MEDLINE KIT)  15 mL Mouth Rinse BID  . Chlorhexidine Gluconate Cloth  6 each Topical Daily  . clopidogrel  75 mg Per Tube Daily  . folic acid  1 mg Per Tube Daily  . free water  200 mL Per Tube Q4H  . gabapentin  300 mg Per Tube Daily  .  heparin  5,000 Units Subcutaneous Q8H  . insulin aspart  0-9 Units Subcutaneous Q4H  . insulin aspart  5 Units Subcutaneous Q4H  . insulin glargine  10 Units Subcutaneous Daily  . ipratropium-albuterol  3 mL Nebulization Q4H  . lactulose  10 g Per Tube BID  . mouth rinse  15 mL Mouth Rinse 10 times per day  . methylPREDNISolone (SOLU-MEDROL) injection  20 mg Intravenous Q12H  . multivitamin with minerals  1 tablet Per Tube Daily  . Oxcarbazepine  150 mg Per Tube QHS  . pantoprazole sodium  40 mg Per Tube Daily  . polyethylene glycol  17 g Per Tube Daily  . senna  1 tablet Per Tube QHS  . sodium chloride flush  10-40 mL Intracatheter Q12H  . thiamine  100 mg Oral Daily   Or  . thiamine  100 mg Intravenous Daily   Continuous Infusions: . amiodarone 30 mg/hr (03/28/20 0600)  . dexmedetomidine (PRECEDEX) IV infusion Stopped (03/24/20 0801)  . feeding supplement (VITAL AF 1.2 CAL) 55 mL/hr at 03/28/20 0600  . fentaNYL infusion INTRAVENOUS Stopped (03/26/20 0937)   PRN Meds:.acetaminophen, hydrALAZINE, labetalol, sodium chloride flush  Best Practice/Protocols:  VTE Prophylaxis: Heparin (SQ) GI Prophylaxis: Proton Pump Inhibitor Sepsis (date>>>6/25)  Consults: Treatment Team:  Pccm, Ander Gaster, MD Teodoro Spray, MD Leotis Pain, MD    Studies: CT ABDOMEN PELVIS WO CONTRAST  Result Date: 03/20/2020 CLINICAL DATA:  Periumbilical abdominal pain. EXAM: CT ABDOMEN AND PELVIS WITHOUT CONTRAST TECHNIQUE: Multidetector CT imaging of the abdomen and pelvis was performed following the standard protocol without IV contrast. COMPARISON:  None. FINDINGS: Lower chest: Marked severity infiltrates are seen within the posterior aspect of the bilateral lower lobes, left greater than right. Chronic posterior right ninth and tenth rib fractures are seen. Hepatobiliary: No focal liver abnormality is seen. The gallbladder is markedly distended without evidence of gallstones, gallbladder wall  thickening, or biliary dilatation. Pancreas: Unremarkable. No pancreatic ductal dilatation or surrounding inflammatory changes. Spleen: Normal in size without focal abnormality. Adrenals/Urinary Tract: 3.1 cm x 1.5 cm and 1.5 cm x 1.2 cm low-attenuation left adrenal masses are seen. Kidneys are normal, without renal calculi, focal lesion, or hydronephrosis. Bladder is unremarkable. Stomach/Bowel: Stomach is within normal limits. Appendix appears normal.  No evidence of bowel wall thickening, distention, or inflammatory changes. Vascular/Lymphatic: There is marked severity calcification of the abdominal aorta. Bilateral common iliac artery and bilateral external iliac artery stents are seen. No enlarged abdominal or pelvic lymph nodes. Reproductive: Uterus and bilateral adnexa are unremarkable. Other: No abdominal wall hernia or abnormality. No abdominopelvic ascites. Musculoskeletal: Marked severity degenerative changes are seen at the level of L5-S1. IMPRESSION: 1. Marked severity bilateral lower lobe infiltrates, left greater than right. 2. Low-attenuation left adrenal masses which may represent adrenal adenomas. 3. Marked severity calcification of the abdominal aorta. 4. Bilateral common iliac artery and bilateral external iliac artery stents. 5. Marked severity degenerative changes at the level of L5-S1. Aortic Atherosclerosis (ICD10-I70.0). Electronically Signed   By: Virgina Norfolk M.D.   On: 03/20/2020 20:52   DG Chest 1 View  Result Date: 03/21/2020 CLINICAL DATA:  Abdominal distension EXAM: CHEST  1 VIEW COMPARISON:  Radiograph 03/20/2020, CT abdomen pelvis 03/20/2020, CT chest 11/25/2019 FINDINGS: Dual lead pacer pack overlies left chest wall with leads in stable position at the right atrium and cardiac apex. Telemetry leads and nasal cannula overlie the chest. Persistent bibasilar consolidative opacities. Suspect a trace left effusion. Some fissural and septal thickening is noted with increasing  central venous congestion and central cuffing. Could reflect superimposed edema. Remote posttraumatic deformity of the distal right clavicle. No acute osseous or soft tissue abnormality. The aorta is calcified. The remaining cardiomediastinal contours are unremarkable. IMPRESSION: 1. Persistent bibasilar consolidative opacities. 2. Increasing central venous congestion, cuffing and septal thickening. Likely developing interstitial edema. Electronically Signed   By: Lovena Le M.D.   On: 03/21/2020 02:15   DG Chest 2 View  Result Date: 03/20/2020 CLINICAL DATA:  Shortness of breath. EXAM: CHEST - 2 VIEW COMPARISON:  July 11, 2017 FINDINGS: There is a dual lead AICD. Mild to moderate severity areas of atelectasis and/or infiltrate are seen within the bilateral lung bases. There is a small left pleural effusion. No pneumothorax is identified. The heart size and mediastinal contours are within normal limits. The visualized skeletal structures are unremarkable. IMPRESSION: 1. Mild to moderate severity bibasilar atelectasis and/or infiltrate. 2. Small left pleural effusion. Electronically Signed   By: Virgina Norfolk M.D.   On: 03/20/2020 20:53   DG Abd 1 View  Result Date: 03/21/2020 CLINICAL DATA:  Abdominal distension EXAM: ABDOMEN - 1 VIEW COMPARISON:  CT abdomen pelvis 03/20/2020 FINDINGS: Persistent consolidative opacities in the lung bases. Trace left effusion. Cardiomediastinal contours are stable from prior. No high-grade obstructive bowel gas pattern is seen in the abdomen or pelvis. Moderate colonic stool burden. Air and stool projects over the rectal vault. Multiple vascular calcifications noted throughout the upper abdomen including over the renal pelves compatible with findings on CT. No suspicious calcifications. Vascular stenting of the iliac arteries. Multilevel degenerative changes in the spine, hips and pelvis. External rotation of the right hip limits evaluation of the femoral neck. If  there is concern for femoral abnormality, recommend dedicated imaging. IMPRESSION: 1. No high-grade obstructive bowel gas pattern in the abdomen or pelvis. 2. Moderate colonic stool burden. 3. Persistent consolidative opacities in the lung bases. Trace left effusion. Electronically Signed   By: Lovena Le M.D.   On: 03/21/2020 02:12   CT HEAD WO CONTRAST  Result Date: 03/27/2020 CLINICAL DATA:  Encephalopathy. EXAM: CT HEAD WITHOUT CONTRAST TECHNIQUE: Contiguous axial images were obtained from the base of the skull through the vertex without intravenous contrast. COMPARISON:  None. FINDINGS: Brain:  There is no evidence of acute infarct, intracranial hemorrhage, mass, midline shift, or extra-axial fluid collection. The ventricles and sulci are within normal limits for age. Hypodensities in the cerebral white matter bilaterally are nonspecific but compatible with mild chronic small vessel ischemic disease. Vascular: Calcified atherosclerosis at the skull base. No hyperdense vessel. Skull: No fracture suspicious osseous lesion. 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 Trace right mastoid fluid. Unremarkable included orbits. Other: None. IMPRESSION: 1. No evidence of acute intracranial abnormality. 2. Mild chronic small vessel ischemic disease. Electronically Signed   By: Logan Bores M.D.   On: 03/27/2020 12:02   DG Chest Port 1 View  Result Date: 03/25/2020 CLINICAL DATA:  Shortness of breath EXAM: PORTABLE CHEST 1 VIEW COMPARISON:  03/24/2020 FINDINGS: Cardiac shadow is stable. Pacing device is again seen. Endotracheal tube and gastric catheter are noted in satisfactory position. Right-sided PICC line is noted in satisfactory position. Patchy airspace opacities are noted but slightly improved when compared with the prior exam. No bony abnormality is noted. IMPRESSION: Slight improved aeration when compared with the prior study. Electronically Signed   By: Inez Catalina M.D.   On:  03/25/2020 10:36   DG Chest Port 1 View  Result Date: 03/24/2020 CLINICAL DATA:  Respiratory distress EXAM: PORTABLE CHEST 1 VIEW COMPARISON:  None. FINDINGS: Endotracheal tube essentially at the carina. Recommend retraction by 3 to 4 cm. NG tube in stomach. Normal cardiac silhouette. Diffuse airspace disease slightly increased in density. Small LEFT effusion unchanged IMPRESSION: 1. Endotracheal tube at carina.  Consider retraction by 3-4 cm. 2. Diffuse bilateral airspace disease slightly worse. These results will be called to the ordering clinician or representative by the Radiologist Assistant, and communication documented in the PACS or [employer]. Electronically Signed   By: Suzy Bouchard M.D.   On: 03/24/2020 08:58   DG Chest Port 1 View  Result Date: 03/22/2020 CLINICAL DATA:  Acute respiratory failure EXAM: PORTABLE CHEST 1 VIEW COMPARISON:  March 21, 2020 FINDINGS: Stable pacemaker. The cardiomediastinal silhouette is stable. No pneumothorax. Bilateral patchy pulmonary infiltrates have worsened in the interval. IMPRESSION: Increasing bilateral pulmonary infiltrates may represent diffuse multifocal pneumonia versus developing asymmetric edema. Recommend clinical correlation and short-term follow-up to ensure resolution. Electronically Signed   By: Dorise Bullion III M.D   On: 03/22/2020 07:58   ECHOCARDIOGRAM COMPLETE  Result Date: 03/24/2020    ECHOCARDIOGRAM REPORT   Patient Name:   Cynthia [name] Date of Exam: 03/23/2020 Medical Rec #:  [id number]      Height:       66.0 in Accession #:    [id number]     Weight:       141.5 lb Date of Birth:  [birth year]      BSA:          1.727 m Patient Age:    59 years       BP:           148/62 mmHg Patient Gender: F              HR:           82 bpm. Exam Location:  [facility] Procedure: 2D Echo, Cardiac Doppler and Color Doppler Indications:     Acute Respiratory Insufficiency 518.82 / R06.89  History:         Patient has prior history of Echocardiogram  examinations. Risk  Factors:Hypertension. 2nd degree heart block.  Sonographer:     Alyse Low Roar Referring Phys:  [id number] Flora Lipps Diagnosing Phys: Isaias Cowman MD IMPRESSIONS  1. Left ventricular ejection fraction, by estimation, is 50 to 55%. The left ventricle has low normal function. The left ventricle has no regional wall motion abnormalities. There is mild left ventricular hypertrophy. Left ventricular diastolic parameters were normal.  2. Right ventricular systolic function is normal. The right ventricular size is normal. There is moderately elevated pulmonary artery systolic pressure.  3. The mitral valve is normal in structure. Mild mitral valve regurgitation. No evidence of mitral stenosis.  4. The aortic valve is normal in structure. Aortic valve regurgitation is mild to moderate. No aortic stenosis is present.  5. The inferior vena cava is normal in size with greater than 50% respiratory variability, suggesting right atrial pressure of 3 mmHg. FINDINGS  Left Ventricle: Left ventricular ejection fraction, by estimation, is 50 to 55%. The left ventricle has low normal function. The left ventricle has no regional wall motion abnormalities. The left ventricular internal cavity size was normal in size. There is mild left ventricular hypertrophy. Left ventricular diastolic parameters were normal. Right Ventricle: The right ventricular size is normal. No increase in right ventricular wall thickness. Right ventricular systolic function is normal. There is moderately elevated pulmonary artery systolic pressure. The tricuspid regurgitant velocity is 3.07 m/s, and with an assumed right atrial pressure of 10 mmHg, the estimated right ventricular systolic pressure is 94.7 mmHg. Left Atrium: Left atrial size was normal in size. Right Atrium: Right atrial size was normal in size. Pericardium: There is no evidence of pericardial effusion. Mitral Valve: The mitral valve is normal in structure.  Normal mobility of the mitral valve leaflets. Mild mitral valve regurgitation. No evidence of mitral valve stenosis. Tricuspid Valve: The tricuspid valve is normal in structure. Tricuspid valve regurgitation is mild . No evidence of tricuspid stenosis. Aortic Valve: The aortic valve is normal in structure. Aortic valve regurgitation is mild to moderate. Aortic regurgitation PHT measures 410 msec. No aortic stenosis is present. Aortic valve mean gradient measures 6.0 mmHg. Aortic valve peak gradient measures 13.8 mmHg. Aortic valve area, by VTI measures 1.95 cm. Pulmonic Valve: The pulmonic valve was normal in structure. Pulmonic valve regurgitation is not visualized. No evidence of pulmonic stenosis. Aorta: The aortic root is normal in size and structure. Venous: The inferior vena cava is normal in size with greater than 50% respiratory variability, suggesting right atrial pressure of 3 mmHg. IAS/Shunts: No atrial level shunt detected by color flow Doppler.  LEFT VENTRICLE PLAX 2D LVIDd:         4.40 cm  Diastology LVIDs:         3.13 cm  LV e' lateral:   13.90 cm/s LV PW:         1.21 cm  LV E/e' lateral: 5.9 LV IVS:        1.30 cm  LV e' medial:    10.60 cm/s LVOT diam:     1.70 cm  LV E/e' medial:  7.8 LV SV:         64 LV SV Index:   37 LVOT Area:     2.27 cm  RIGHT VENTRICLE RV Mid diam:    3.04 cm RV S prime:     15.40 cm/s LEFT ATRIUM             Index       RIGHT ATRIUM  Index LA diam:        3.55 cm 2.06 cm/m  RA Area:     14.50 cm LA Vol (A2C):   45.5 ml 26.35 ml/m RA Volume:   35.40 ml  20.50 ml/m LA Vol (A4C):   40.9 ml 23.69 ml/m LA Biplane Vol: 44.2 ml 25.60 ml/m  AORTIC VALVE                    PULMONIC VALVE AV Area (Vmax):    1.93 cm     PV Vmax:        1.18 m/s AV Area (Vmean):   1.88 cm     PV Peak grad:   5.6 mmHg AV Area (VTI):     1.95 cm     RVOT Peak grad: 2 mmHg AV Vmax:           186.00 cm/s AV Vmean:          109.000 cm/s AV VTI:            0.330 m AV Peak Grad:       13.8 mmHg AV Mean Grad:      6.0 mmHg LVOT Vmax:         158.00 cm/s LVOT Vmean:        90.200 cm/s LVOT VTI:          0.283 m LVOT/AV VTI ratio: 0.86 AI PHT:            410 msec  AORTA Ao Root diam: 2.50 cm MITRAL VALVE               TRICUSPID VALVE MV Area (PHT): 3.23 cm    TR Peak grad:   37.7 mmHg MV Decel Time: 235 msec    TR Vmax:        307.00 cm/s MV E velocity: 82.60 cm/s MV A velocity: 70.00 cm/s  SHUNTS MV E/A ratio:  1.18        Systemic VTI:  0.28 m MV A Prime:    10.2 cm/s   Systemic Diam: 1.70 cm Isaias Cowman MD Electronically signed by Isaias Cowman MD Signature Date/Time: 03/24/2020/7:47:47 AM    Final    [place] EKG SITE RITE  Result Date: 03/23/2020 If Site Rite image not attached, placement could not be confirmed due to current cardiac rhythm.   Subjective:    Overnight Issues: Remains intubated and unresponsive.   PHYSICAL EXAM  Vital signs for last 24 hours: Temp:  [97.8 F (36.6 C)-101 F (38.3 C)] 98.2 F (36.8 C) (07/03 0400) Pulse Rate:  [55-67] 55 (07/03 0600) Resp:  [21-33] 22 (07/03 0600) BP: (97-170)/(39-57) 116/44 (07/03 0600) SpO2:  [94 %-97 %] 96 % (07/03 0600) FiO2 (%):  [35 %] 35 % (07/03 0803) Weight:  [63.8 kg] 63.8 kg (07/03 0444)  Intake/Output from previous day: 07/02 0701 - 07/03 0700 In: 2261.5 [I.V.:392; NG/GT:1869.6] Out: 2800 [Urine:2800]  Intake/Output this shift: No intake/output data recorded.  Vent settings for last 24 hours: Vent Mode: PRVC FiO2 (%):  [35 %] 35 % Set Rate:  [20 bmp] 20 bmp Vt Set:  [450 mL] 450 mL PEEP:  [5 cmH20] 5 cmH20 Plateau Pressure:  [15 cmH20-19 cmH20] 15 cmH20  GENERAL: 64 year old patient lying in the bed on mechanical ventillation EYES:  No scleral icterus. Extraocular muscles intact.  HEENT: Head atraumatic, normocephalic. Oropharynx and nasopharynx clear.  NECK:  Supple, no jugular venous distention. No thyroid enlargement, no tenderness.  LUNGS: Decreased  breath sounds bilaterally, no  wheezing, rales,rhonchi or crepitation. No use of accessory muscles of respiration.  CARDIOVASCULAR: S1, S2 normal. No murmurs, rubs, or gallops.  ABDOMEN: Soft, nontender, nondistended. Bowel sounds present. No organomegaly or mass.  EXTREMITIES: Generalized dependent edema, cyanosis, or clubbing.  FOCUSED NEUROLOGIC:Patient does not respond to verbal stimuli.  Does not respond to deep sternal rub.  Does not follow commands.  No verbalizations noted.  Cranial Nerves: II: patient does not respond confrontation bilaterally, pupils right 2 mm, left 2 mm,and minimally bilaterally III,IV,VI: doll's response absent bilaterally.  V,VII: corneal reflex absent bilaterally  VIII: patient does not respond to verbal stimuli IX,X: gag reflex reduced, XI: trapezius strength unable to test bilaterally XII: tongue strength unable to test Motor: Extremities flaccid throughout.  No spontaneous movement noted.  No purposeful movements noted. Sensory: Does not respond to noxious stimuli in any extremity. Deep Tendon Reflexes:  Absent throughout. Plantars: absent bilaterally Cerebellar: Unable to perform  PSYCHIATRIC: Unable to assess.  SKIN: No obvious rash, lesion, or ulcer.   Results for orders placed or performed during the hospital encounter of 03/20/20 (from the past 24 hour(s))  Ammonia     Status: Abnormal   Collection Time: 03/27/20 10:57 AM  Result Value Ref Range   Ammonia 51 (H) 9 - 35 umol/L  Glucose, capillary     Status: Abnormal   Collection Time: 03/27/20 11:57 AM  Result Value Ref Range   Glucose-Capillary 179 (H) 70 - 99 mg/dL  Glucose, capillary     Status: Abnormal   Collection Time: 03/27/20  3:47 PM  Result Value Ref Range   Glucose-Capillary 176 (H) 70 - 99 mg/dL  Glucose, capillary     Status: Abnormal   Collection Time: 03/27/20  7:16 PM  Result Value Ref Range   Glucose-Capillary 250 (H) 70 - 99 mg/dL  Glucose, capillary     Status: Abnormal   Collection Time:  03/27/20 11:14 PM  Result Value Ref Range   Glucose-Capillary 240 (H) 70 - 99 mg/dL  Glucose, capillary     Status: Abnormal   Collection Time: 03/28/20  3:38 AM  Result Value Ref Range   Glucose-Capillary 222 (H) 70 - 99 mg/dL  CBC with Differential/Platelet     Status: Abnormal   Collection Time: 03/28/20  4:25 AM  Result Value Ref Range   WBC 12.4 (H) 4.0 - 10.5 K/uL   RBC 4.32 3.87 - 5.11 MIL/uL   Hemoglobin 12.9 12.0 - 15.0 g/dL   HCT 43.2 36 - 46 %   MCV 100.0 80.0 - 100.0 fL   MCH 29.9 26.0 - 34.0 pg   MCHC 29.9 (L) 30.0 - 36.0 g/dL   RDW 16.2 (H) 11.5 - 15.5 %   Platelets 144 (L) 150 - 400 K/uL   nRBC 0.0 0.0 - 0.2 %   Neutrophils Relative % 89 %   Neutro Abs 11.0 (H) 1.7 - 7.7 K/uL   Lymphocytes Relative 7 %   Lymphs Abs 0.9 0.7 - 4.0 K/uL   Monocytes Relative 3 %   Monocytes Absolute 0.3 0 - 1 K/uL   Eosinophils Relative 0 %   Eosinophils Absolute 0.0 0 - 0 K/uL   Basophils Relative 0 %   Basophils Absolute 0.0 0 - 0 K/uL   Immature Granulocytes 1 %   Abs Immature Granulocytes 0.17 (H) 0.00 - 0.07 K/uL  Magnesium     Status: None   Collection Time: 03/28/20  4:25 AM  Result Value  Ref Range   Magnesium 2.4 1.7 - 2.4 mg/dL  Phosphorus     Status: None   Collection Time: 03/28/20  4:25 AM  Result Value Ref Range   Phosphorus 4.0 2.5 - 4.6 mg/dL  Comprehensive metabolic panel     Status: Abnormal   Collection Time: 03/28/20  4:25 AM  Result Value Ref Range   Sodium 147 (H) 135 - 145 mmol/L   Potassium 4.8 3.5 - 5.1 mmol/L   Chloride 111 98 - 111 mmol/L   CO2 31 22 - 32 mmol/L   Glucose, Bld 251 (H) 70 - 99 mg/dL   BUN 62 (H) 8 - 23 mg/dL   Creatinine, Ser 1.19 (H) 0.44 - 1.00 mg/dL   Calcium 9.6 8.9 - 10.3 mg/dL   Total Protein 6.7 6.5 - 8.1 g/dL   Albumin 2.4 (L) 3.5 - 5.0 g/dL   AST 11 (L) 15 - 41 U/L   ALT 34 0 - 44 U/L   Alkaline Phosphatase 56 38 - 126 U/L   Total Bilirubin 0.7 0.3 - 1.2 mg/dL   GFR calc non Af Amer 48 (L) >60 mL/min   GFR calc Af  Amer 55 (L) >60 mL/min   Anion gap 5 5 - 15  Ammonia     Status: None   Collection Time: 03/28/20  4:26 AM  Result Value Ref Range   Ammonia 29 9 - 35 umol/L  Glucose, capillary     Status: Abnormal   Collection Time: 03/28/20  7:30 AM  Result Value Ref Range   Glucose-Capillary 239 (H) 70 - 99 mg/dL     ASSESSMENT/PLAN   Acute Hypoxic Respiratory Failure secondary to  Pneumonia - COPD with evidence of acute exacerbation - Full vent support for now-vent settings reviewed and established  - SBT once all parameters met  - VAP bundle implemented -Trend WBC and monitor fever curve - - Repeat CXR on 6/30 shows slight improved aeration  - Follow intermittent ABG and chest x-ray as needed - As needed bronchodilators  Sepsis secondary to pneumonia + UTI -Trend WBCs,Lactic and procalcitonin - Blood and urine cultures pending - Completed Abx with Ceftriaxone and Azithromycin - IVFs and PRN bolus to keep MAP<23mHg or SBP <96mg  Atrial Fibrillation with RVR -  Hx: Second degree heart block s/p pacemaker - Rate controlled on Amiodarone. Will switch to po via tube - Cardiology input appreciated   Acute Kidney Injury - Improving - Monitor I&O's / urinary output - Follow BMP - Ensure adequate renal perfusion - Avoid nephrotoxic agents as able - Replace electrolytes as indicated  Acute toxicmetabolicencephalopathy Mechanical Intubation pain/discomfort Hx: Bipolar Disorder,TBI - Maintain RASS goal -1 to -2 - Hold Lithium, Seroquel - Repeat CT head 7/2 showed no evidence of acute intracranial abnormality - Ammonia levels elevated started on Lactulose ow improved. Continue to trend - Neurology consult - Will need Psych consult to assist with medication management at some point  Seizure Disorder - Continue  Half doseTrilepta, Gabapentin  - Seizure precautions - PRN Ativan for seizure     ElRufina FalcoDNP, CCRN, FNP-BC AGMount Rainier
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
## 2020-03-28 NOTE — Procedures (Signed)
Date of Study: 03/28/2020  Reason for Study:  Pt is a 2 YOF with PMH of HTN, HLD, COPD, tobacco use/emphysema, second-degree heart block s/p PPM placement, PVD s/p multiple stent placements (11/2019), chronic hepatitis C, bipolar disorder, h/o substance abuse, PTSD, h/o CVA, SEIZURES, TBI with residual memory deficit who had acute hypoxic respiratory failure secondary to CAP and COPD with acute exacerbation and AMS. Eval for seizures.  Description of recording:  This recording was obtained using a Digital EEG system with 18 channel capacity . Standard bipolar and referential EEG montages were used following the International 10 - 20 System .   This is a digitally recorded EEG with duration of approximately ~20:34 minutes. The background consists of 4-7 hz delta/theta rhythm throughout study.  There are some rare triphasic waves. There were no epileptiform discharges or electrographic seizures noted. Hyperventilation was not performed. Photic stimulation was not performed. Sleep did not occur in this recording. EKG shows normal sinus rhythm throughout the study.  IMPRESSION: This is an abnormal EEG for age due to diffuse background slowing and rare triphasic waves which can be seen with toxic/metabolic encephalopathy, cerebral ischemia, intracerebral hemorrhage, tumors, traumatic brain injury, cortical malformations, or other cerebral dysfunction. There were no clinical seizures or epileptiform discharges noted during study. Clinical correlation is advised.   I have personally reviewed this study.

## 2020-03-29 ENCOUNTER — Encounter: Payer: Self-pay | Admitting: Internal Medicine

## 2020-03-29 ENCOUNTER — Inpatient Hospital Stay: Payer: Medicare Other

## 2020-03-29 DIAGNOSIS — G9341 Metabolic encephalopathy: Secondary | ICD-10-CM | POA: Diagnosis not present

## 2020-03-29 LAB — COMPREHENSIVE METABOLIC PANEL
ALT: 35 U/L (ref 0–44)
AST: 19 U/L (ref 15–41)
Albumin: 2.7 g/dL — ABNORMAL LOW (ref 3.5–5.0)
Alkaline Phosphatase: 65 U/L (ref 38–126)
Anion gap: 7 (ref 5–15)
BUN: 55 mg/dL — ABNORMAL HIGH (ref 8–23)
CO2: 30 mmol/L (ref 22–32)
Calcium: 9.6 mg/dL (ref 8.9–10.3)
Chloride: 110 mmol/L (ref 98–111)
Creatinine, Ser: 1.03 mg/dL — ABNORMAL HIGH (ref 0.44–1.00)
GFR calc Af Amer: >60 mL/min (ref >60)
GFR calc non Af Amer: 57 mL/min — ABNORMAL LOW (ref >60)
Glucose, Bld: 290 mg/dL — ABNORMAL HIGH (ref 70–99)
Potassium: 4.5 mmol/L (ref 3.5–5.1)
Sodium: 147 mmol/L — ABNORMAL HIGH (ref 135–145)
Total Bilirubin: 0.7 mg/dL (ref 0.3–1.2)
Total Protein: 6.8 g/dL (ref 6.5–8.1)

## 2020-03-29 LAB — GLUCOSE, CAPILLARY
Glucose-Capillary: 127 mg/dL — ABNORMAL HIGH (ref 70–99)
Glucose-Capillary: 175 mg/dL — ABNORMAL HIGH (ref 70–99)
Glucose-Capillary: 187 mg/dL — ABNORMAL HIGH (ref 70–99)
Glucose-Capillary: 221 mg/dL — ABNORMAL HIGH (ref 70–99)
Glucose-Capillary: 227 mg/dL — ABNORMAL HIGH (ref 70–99)
Glucose-Capillary: 230 mg/dL — ABNORMAL HIGH (ref 70–99)
Glucose-Capillary: 287 mg/dL — ABNORMAL HIGH (ref 70–99)

## 2020-03-29 LAB — CBC WITH DIFFERENTIAL/PLATELET
Abs Immature Granulocytes: 0.17 10*3/uL — ABNORMAL HIGH (ref 0.00–0.07)
Basophils Absolute: 0 10*3/uL (ref 0.0–0.1)
Basophils Relative: 0 %
Eosinophils Absolute: 0 10*3/uL (ref 0.0–0.5)
Eosinophils Relative: 0 %
HCT: 44.2 % (ref 36.0–46.0)
Hemoglobin: 13.2 g/dL (ref 12.0–15.0)
Immature Granulocytes: 1 %
Lymphocytes Relative: 5 %
Lymphs Abs: 0.6 10*3/uL — ABNORMAL LOW (ref 0.7–4.0)
MCH: 29.4 pg (ref 26.0–34.0)
MCHC: 29.9 g/dL — ABNORMAL LOW (ref 30.0–36.0)
MCV: 98.4 fL (ref 80.0–100.0)
Monocytes Absolute: 0.2 10*3/uL (ref 0.1–1.0)
Monocytes Relative: 2 %
Neutro Abs: 11.7 10*3/uL — ABNORMAL HIGH (ref 1.7–7.7)
Neutrophils Relative %: 92 %
Platelets: 170 10*3/uL (ref 150–400)
RBC: 4.49 MIL/uL (ref 3.87–5.11)
RDW: 15.8 % — ABNORMAL HIGH (ref 11.5–15.5)
WBC: 12.8 10*3/uL — ABNORMAL HIGH (ref 4.0–10.5)
nRBC: 0 % (ref 0.0–0.2)

## 2020-03-29 LAB — TSH: TSH: 1.319 u[IU]/mL (ref 0.350–4.500)

## 2020-03-29 LAB — T4, FREE: Free T4: 0.57 ng/dL — ABNORMAL LOW (ref 0.61–1.12)

## 2020-03-29 LAB — MAGNESIUM: Magnesium: 2.3 mg/dL (ref 1.7–2.4)

## 2020-03-29 LAB — AMMONIA: Ammonia: 36 umol/L — ABNORMAL HIGH (ref 9–35)

## 2020-03-29 LAB — PHOSPHORUS: Phosphorus: 3.9 mg/dL (ref 2.5–4.6)

## 2020-03-29 MED ORDER — FREE WATER
200.0000 mL | Status: DC
  Administered 2020-03-29 – 2020-03-31 (×30): 200 mL

## 2020-03-29 MED ORDER — FUROSEMIDE 10 MG/ML IJ SOLN
40.0000 mg | Freq: Once | INTRAMUSCULAR | Status: AC
  Administered 2020-03-29: 40 mg via INTRAVENOUS
  Filled 2020-03-29: qty 4

## 2020-03-29 MED ORDER — FUROSEMIDE 10 MG/ML IJ SOLN
20.0000 mg | Freq: Once | INTRAMUSCULAR | Status: AC
  Administered 2020-03-29: 20 mg via INTRAVENOUS
  Filled 2020-03-29: qty 2

## 2020-03-29 MED ORDER — OXCARBAZEPINE 300 MG PO TABS
300.0000 mg | ORAL_TABLET | Freq: Every day | ORAL | Status: DC
  Administered 2020-03-29 – 2020-04-02 (×4): 300 mg
  Filled 2020-03-29 (×6): qty 1

## 2020-03-29 MED ORDER — INSULIN ASPART 100 UNIT/ML ~~LOC~~ SOLN
0.0000 [IU] | SUBCUTANEOUS | Status: DC
  Administered 2020-03-29: 7 [IU] via SUBCUTANEOUS
  Administered 2020-03-29: 3 [IU] via SUBCUTANEOUS
  Administered 2020-03-29: 4 [IU] via SUBCUTANEOUS
  Administered 2020-03-29: 11 [IU] via SUBCUTANEOUS
  Administered 2020-03-29 – 2020-03-30 (×2): 4 [IU] via SUBCUTANEOUS
  Administered 2020-03-30: 3 [IU] via SUBCUTANEOUS
  Administered 2020-03-30: 11 [IU] via SUBCUTANEOUS
  Administered 2020-03-30: 7 [IU] via SUBCUTANEOUS
  Administered 2020-03-30: 11 [IU] via SUBCUTANEOUS
  Administered 2020-03-30: 7 [IU] via SUBCUTANEOUS
  Administered 2020-03-31: 3 [IU] via SUBCUTANEOUS
  Administered 2020-03-31 (×2): 4 [IU] via SUBCUTANEOUS
  Administered 2020-03-31 – 2020-04-01 (×2): 3 [IU] via SUBCUTANEOUS
  Administered 2020-04-01: 4 [IU] via SUBCUTANEOUS
  Administered 2020-04-01 (×2): 3 [IU] via SUBCUTANEOUS
  Administered 2020-04-02: 4 [IU] via SUBCUTANEOUS
  Administered 2020-04-02: 3 [IU] via SUBCUTANEOUS
  Filled 2020-03-29 (×22): qty 1

## 2020-03-29 NOTE — Progress Notes (Signed)
[facility] Cardiology    SUBJECTIVE: Intubated sedated   Vitals:   03/29/20 0700 03/29/20 0800 03/29/20 1100 03/29/20 1200  BP: (!) 167/55 (!) 172/66 (!) 160/46 (!) 154/62  Pulse: (!) 58 100  99  Resp: (!) 26 (!) 24  (!) 23  Temp:  99.1 F (37.3 C)  99.9 F (37.7 C)  TempSrc:  Axillary  Axillary  SpO2: 96% 97%  96%  Weight:      Height:         Intake/Output Summary (Last 24 hours) at 03/29/2020 1421 Last data filed at 03/29/2020 5329 Gross per 24 hour  Intake 2404.01 ml  Output 1365 ml  Net 1039.01 ml      PHYSICAL EXAM  General: Well developed, well nourished, in no acute distress on the vent HEENT:  Normocephalic and atramatic Neck:  No JVD.  Lungs: Clear bilaterally to auscultation and percussion. Heart: HRRR . Normal S1 and S2 without gallops or murmurs.  Abdomen: Bowel sounds are positive, abdomen soft and non-tender  Msk:  Back normal, normal gait. Normal strength and tone for age. Extremities: No clubbing, cyanosis or edema.   Neuro: Alert and oriented X 3. Psych:  Good affect, responds appropriately   LABS: Basic Metabolic Panel: Recent Labs    03/28/20 0425 03/29/20 0426  NA 147* 147*  K 4.8 4.5  CL 111 110  CO2 31 30  GLUCOSE 251* 290*  BUN 62* 55*  CREATININE 1.19* 1.03*  CALCIUM 9.6 9.6  MG 2.4 2.3  PHOS 4.0 3.9   Liver Function Tests: Recent Labs    03/28/20 0425 03/29/20 0426  AST 11* 19  ALT 34 35  ALKPHOS 56 65  BILITOT 0.7 0.7  PROT 6.7 6.8  ALBUMIN 2.4* 2.7*   No results for input(s): LIPASE, AMYLASE in the last 72 hours. CBC: Recent Labs    03/28/20 0425 03/29/20 0426  WBC 12.4* 12.8*  NEUTROABS 11.0* 11.7*  HGB 12.9 13.2  HCT 43.2 44.2  MCV 100.0 98.4  PLT 144* 170   Cardiac Enzymes: No results for input(s): CKTOTAL, CKMB, CKMBINDEX, TROPONINI in the last 72 hours. BNP: Invalid input(s): POCBNP D-Dimer: No results for input(s): DDIMER in the last 72 hours. Hemoglobin A1C: No results for input(s): HGBA1C in the  last 72 hours. Fasting Lipid Panel: No results for input(s): CHOL, HDL, LDLCALC, TRIG, CHOLHDL, LDLDIRECT in the last 72 hours. Thyroid Function Tests: Recent Labs    03/29/20 0426  TSH 1.319   Anemia Panel: No results for input(s): VITAMINB12, FOLATE, FERRITIN, TIBC, IRON, RETICCTPCT in the last 72 hours.  EEG  Result Date: 03/28/2020 Cynthia Kingdom, MD     03/28/2020  8:06 PM Date of Study: 03/28/2020 Reason for Study:  Pt is a 53 YOF with PMH of HTN, HLD, COPD, tobacco use/emphysema, second-degree heart block s/p PPM placement, PVD s/p multiple stent placements (11/2019), chronic hepatitis C, bipolar disorder, h/o substance abuse, PTSD, h/o CVA, SEIZURES, TBI with residual memory deficit who had acute hypoxic respiratory failure secondary to CAP and COPD with acute exacerbation and AMS. Eval for seizures. Description of recording: This recording was obtained using a Digital EEG system with 18 channel capacity . Standard bipolar and referential EEG montages were used following the International 10 - 20 System . This is a digitally recorded EEG with duration of approximately ~20:34 minutes. The background consists of 4-7 hz delta/theta rhythm throughout study.  There are some rare triphasic waves. There were no epileptiform discharges or electrographic seizures  noted. Hyperventilation was not performed. Photic stimulation was not performed. Sleep did not occur in this recording. EKG shows normal sinus rhythm throughout the study. IMPRESSION: This is an abnormal EEG for age due to diffuse background slowing and rare triphasic waves which can be seen with toxic/metabolic encephalopathy, cerebral ischemia, intracerebral hemorrhage, tumors, traumatic brain injury, cortical malformations, or other cerebral dysfunction. There were no clinical seizures or epileptiform discharges noted during study. Clinical correlation is advised. I have personally reviewed this study.  DG Chest Port 1 View  Result Date:  03/29/2020 CLINICAL DATA:  64 year old female with history of acute respiratory failure. EXAM: PORTABLE CHEST 1 VIEW COMPARISON:  Chest x-ray 03/25/2020. FINDINGS: An endotracheal tube is in place with tip 6.1 cm above the carina. There is a right upper extremity PICC with tip terminating in the mid superior vena cava. A nasogastric tube is seen extending into the stomach, however, the tip of the nasogastric tube extends below the lower margin of the image. No acute consolidative airspace disease. Trace left pleural effusion. No right pleural effusion. Cephalization of the pulmonary vasculature with some slight indistinctness of interstitial markings. Heart size appears borderline enlarged. Upper mediastinal contours are within normal limits allowing for patient's rotation to the left. Aortic atherosclerosis. Left-sided pacemaker device in place with lead tips projecting over the expected location of the right atrium and right ventricle. IMPRESSION: 1. Support apparatus, as above. 2. Improving aeration which may reflect resolving multilobar pneumonia and/or resolving pulmonary edema. 3. Trace left pleural effusion. 4. Aortic atherosclerosis. Electronically Signed   By: Vinnie Langton M.D.   On: 03/29/2020 05:01     Echo preserved left ventricular function ejection fraction between 50 to 55%  TELEMETRY: Normal sinus rhythm rate of 65:  ASSESSMENT AND PLAN:  Principal Problem:   CAP (community acquired pneumonia) Active Problems:   Essential hypertension, benign   Traumatic brain injury ([place])   Seizures ([place])   Bipolar affective disorder ([place])   Artificial cardiac pacemaker   COPD with acute exacerbation ([place])   AKI (acute kidney injury) ([place])   Acute respiratory failure ([place])    Plan Continue respiratory support on the vent Continue therapy for respiratory failure Inhalers for COPD therapy Continue therapy for seizure disorder Maintain management of hypertension Amiodarone for atrial  fibrillation Continue broad-spectrum  antibiotics therapy for pneumonia Agree with nephrology input for acute renal insufficiency Recommend continued conservative cardiac input at this point     Yolonda Kida, MD 03/29/2020 2:21 PM

## 2020-03-29 NOTE — Progress Notes (Signed)
Subjective: Intubated and sedated  Objective: Current vital signs: BP (!) 172/66 (BP Location: Left Arm)   Pulse 100   Temp 99.1 F (37.3 C) (Axillary)   Resp (!) 24   Ht _0  (1.676 m)   Wt 64.6 kg   SpO2 97%   BMI 22.99 kg/m  Vital signs in last 24 hours: Temp:  [97.6 F (36.4 C)-99.1 F (37.3 C)] 99.1 F (37.3 C) (07/04 0800) Pulse Rate:  [55-101] 100 (07/04 0800) Resp:  [16-27] 24 (07/04 0800) BP: (107-188)/(41-66) 172/66 (07/04 0800) SpO2:  [95 %-97 %] 97 % (07/04 0800) FiO2 (%):  [35 %] 35 % (07/04 0800) Weight:  [64.6 kg] 64.6 kg (07/04 0424)  Intake/Output from previous day: 07/03 0701 - 07/04 0700 In: 2404 [I.V.:439; NG/GT:1965] Out: 2215 [Urine:2215] Intake/Output this shift: No intake/output data recorded. Nutritional status:  Diet Order            Diet NPO time specified  Diet effective now                 Neurologic Exam: Mental Status: Patient does not respond to verbal stimuli.  Does not respond to deep sternal rub.  Does not follow commands.  No verbalizations noted.  Cranial Nerves: II: pupils reative bilaterally III,IV,VI: Oculocephalic response present bilaterally. Right eye deviation with nystagmus V,VII: corneal reflex reduced bilaterally  VIII: patient does not respond to verbal stimuli IX,X: gag reflex reduced, XI: trapezius strength unable to test bilaterally XII: tongue strength unable to test Motor: Extremities flaccid throughout.  No spontaneous movement noted.  No purposeful movements noted. Sensory: Does not respond to noxious stimuli in any extremity.   Lab Results: Basic Metabolic Panel: Recent Labs  Lab 03/25/20 0424 03/25/20 0424 03/26/20 0540 03/26/20 0540 03/27/20 0435 03/28/20 0425 03/29/20 0426  NA 145  --  149*  --  150* 147* 147*  K 4.7  --  5.0  --  5.4* 4.8 4.5  CL 112*  --  117*  --  117* 111 110  CO2 26  --  26  --  _1 GLUCOSE 238*  --  304*  --  265* 251* 290*  BUN 56*  --  61*  --  61* 62*  55*  CREATININE 1.40*  --  1.44*  --  1.31* 1.19* 1.03*  CALCIUM 9.8   < > 10.0   < > 10.0 9.6 9.6  MG 2.7*  --  2.6*  --  2.5* 2.4 2.3  PHOS 4.8*  --  3.5  --  3.1 4.0 3.9   < > = values in this interval not displayed.    Liver Function Tests: Recent Labs  Lab 03/23/20 1027 03/28/20 0425 03/29/20 0426  AST 50* 11* 19  ALT 34 34 35  ALKPHOS 71 56 65  BILITOT 0.5 0.7 0.7  PROT 6.9 6.7 6.8  ALBUMIN 2.6* 2.4* 2.7*   No results for input(s): LIPASE, AMYLASE in the last 168 hours. Recent Labs  Lab 03/27/20 1057 03/28/20 0426 03/29/20 0430  AMMONIA 51* 29 36*    CBC: Recent Labs  Lab 03/25/20 0424 03/26/20 0540 03/27/20 0435 03/28/20 0425 03/29/20 0426  WBC 15.0* 13.5* 14.4* 12.4* 12.8*  NEUTROABS 12.9* 12.0* 13.0* 11.0* 11.7*  HGB 12.7 12.5 12.8 12.9 13.2  HCT 42.4 41.9 43.6 43.2 44.2  MCV 100.0 101.7* 101.2* 100.0 98.4  PLT 141* 134* 153 144* 170    Cardiac Enzymes: No results for input(s): CKTOTAL, CKMB, CKMBINDEX, TROPONINI  in the last 168 hours.  Lipid Panel: No results for input(s): CHOL, TRIG, HDL, CHOLHDL, VLDL, LDLCALC in the last 168 hours.  CBG: Recent Labs  Lab 03/28/20 1109 03/28/20 1630 03/28/20 1935 03/29/20 0356 03/29/20 0721  GLUCAP 215* 196* 161* 227* 287*    Microbiology: Results for orders placed or performed during the hospital encounter of 03/20/20  SARS Coronavirus 2 by RT PCR (hospital order, performed in [facility] hospital lab) Nasopharyngeal Nasopharyngeal Swab     Status: None   Collection Time: 03/20/20  8:19 PM   Specimen: Nasopharyngeal Swab  Result Value Ref Range Status   SARS Coronavirus 2 NEGATIVE NEGATIVE Final    Comment: (NOTE) SARS-CoV-2 target nucleic acids are NOT DETECTED.  The SARS-CoV-2 RNA is generally detectable in upper and lower respiratory specimens during the acute phase of infection. The lowest concentration of SARS-CoV-2 viral copies this assay can detect is 250 copies / mL. A negative result  does not preclude SARS-CoV-2 infection and should not be used as the sole basis for treatment or other patient management decisions.  A negative result may occur with improper specimen collection / handling, submission of specimen other than nasopharyngeal swab, presence of viral mutation(s) within the areas targeted by this assay, and inadequate number of viral copies (<250 copies / mL). A negative result must be combined with clinical observations, patient history, and epidemiological information.  Fact Sheet for Patients:   StrictlyIdeas.no  Fact Sheet for Healthcare Providers: BankingDealers.co.za  This test is not yet approved or  cleared by the [place] FDA and has been authorized for detection and/or diagnosis of SARS-CoV-2 by FDA under an Emergency Use Authorization (EUA).  This EUA will remain in effect (meaning this test can be used) for the duration of the COVID-19 declaration under Section 564(b)(1) of the Act, 21 U.S.C. section 360bbb-3(b)(1), unless the authorization is terminated or revoked sooner.  Performed at [facility], [address] [place], [place] [address]   Blood Culture (routine x 2)     Status: None   Collection Time: 03/20/20  9:17 PM   Specimen: BLOOD  Result Value Ref Range Status   Specimen Description BLOOD LEFT FOREARM  Final   Special Requests   Final    BOTTLES DRAWN AEROBIC AND ANAEROBIC Blood Culture results may not be optimal due to an inadequate volume of blood received in culture bottles   Culture   Final    NO GROWTH 5 DAYS Performed at [facility], [address] [place], [place] [address]    Report Status 03/25/2020 FINAL  Final  Blood Culture (routine x 2)     Status: None   Collection Time: 03/20/20 10:11 PM   Specimen: BLOOD  Result Value Ref Range Status   Specimen Description BLOOD RIGHT FOREARM  Final   Special Requests   Final    BOTTLES DRAWN  AEROBIC AND ANAEROBIC Blood Culture results may not be optimal due to an inadequate volume of blood received in culture bottles   Culture   Final    NO GROWTH 5 DAYS Performed at [facility], [address], [place], [place] [address]    Report Status 03/25/2020 FINAL  Final  MRSA PCR Screening     Status: None   Collection Time: 03/21/20  2:07 AM   Specimen: Nasal Mucosa; Nasopharyngeal  Result Value Ref Range Status   MRSA by PCR NEGATIVE NEGATIVE Final    Comment:  The GeneXpert MRSA Assay (FDA approved for NASAL specimens only), is one component of a comprehensive MRSA colonization surveillance program. It is not intended to diagnose MRSA infection nor to guide or monitor treatment for MRSA infections. Performed at [facility], [address] [place], [place] [address]   SARS Coronavirus 2 by RT PCR (hospital order, performed in [facility] hospital lab)     Status: None   Collection Time: 03/23/20 10:12 AM  Result Value Ref Range Status   SARS Coronavirus 2 NEGATIVE NEGATIVE Final    Comment: (NOTE) SARS-CoV-2 target nucleic acids are NOT DETECTED.  The SARS-CoV-2 RNA is generally detectable in upper and lower respiratory specimens during the acute phase of infection. The lowest concentration of SARS-CoV-2 viral copies this assay can detect is 250 copies / mL. A negative result does not preclude SARS-CoV-2 infection and should not be used as the sole basis for treatment or other patient management decisions.  A negative result may occur with improper specimen collection / handling, submission of specimen other than nasopharyngeal swab, presence of viral mutation(s) within the areas targeted by this assay, and inadequate number of viral copies (<250 copies / mL). A negative result must be combined with clinical observations, patient history, and epidemiological information.  Fact Sheet for Patients:    StrictlyIdeas.no  Fact Sheet for Healthcare Providers: BankingDealers.co.za  This test is not yet approved or  cleared by the [place] FDA and has been authorized for detection and/or diagnosis of SARS-CoV-2 by FDA under an Emergency Use Authorization (EUA).  This EUA will remain in effect (meaning this test can be used) for the duration of the COVID-19 declaration under Section 564(b)(1) of the Act, 21 U.S.C. section 360bbb-3(b)(1), unless the authorization is terminated or revoked sooner.  Performed at [facility], [address] [place], [place] [address]   Culture, respiratory (non-expectorated)     Status: None   Collection Time: 03/24/20  4:39 PM   Specimen: Tracheal Aspirate; Respiratory  Result Value Ref Range Status   Specimen Description   Final    TRACHEAL ASPIRATE Performed at [facility], [address], [place], [place] [address]    Special Requests   Final    NONE Performed at [facility], [address] [place], [place] [address]    Gram Stain   Final    RARE WBC PRESENT, PREDOMINANTLY PMN FEW SQUAMOUS EPITHELIAL CELLS PRESENT NO ORGANISMS SEEN    Culture   Final    NO GROWTH Performed at [facility], [place] [address], [place], [place] [address]    Report Status 03/25/2020 FINAL  Final  Expectorated sputum assessment w rflx to resp cult     Status: None   Collection Time: 03/25/20 11:21 AM   Specimen: Sputum  Result Value Ref Range Status   Specimen Description SPUTUM  Final   Special Requests Normal  Final   Sputum evaluation   Final    THIS SPECIMEN IS ACCEPTABLE FOR SPUTUM CULTURE Performed at [facility], [address], [place], [place] [address]    Report Status 03/25/2020 FINAL  Final  Culture, respiratory     Status: None   Collection Time: 03/25/20 11:21 AM   Specimen: SPU  Result Value Ref Range Status   Specimen Description   Final     SPUTUM Performed at [facility], [address] [place],  [address]    Special Requests   Final    Normal  Reflexed from [id number] Performed at [facility], [place], [place] [address]    Gram Stain NO WBC SEEN NO ORGANISMS SEEN   Final   Culture   Final    NO GROWTH 2 DAYS Performed at [facility], [place] [address] [place], [place] [address]    Report Status 03/28/2020 FINAL  Final    Coagulation Studies: No results for input(s): LABPROT, INR in the last 72 hours.  Imaging: EEG  Result Date: 03/28/2020 Philemon Kingdom, MD     03/28/2020  8:06 PM Date of Study: 03/28/2020 Reason for Study:  Pt is a 27 YOF with PMH of HTN, HLD, COPD, tobacco use/emphysema, second-degree heart block s/p PPM placement, PVD s/p multiple stent placements (11/2019), chronic hepatitis C, bipolar disorder, h/o substance abuse, PTSD, h/o CVA, SEIZURES, TBI with residual memory deficit who had acute hypoxic respiratory failure secondary to CAP and COPD with acute exacerbation and AMS. Eval for seizures. Description of recording: This recording was obtained using a Digital EEG system with 18 channel capacity . Standard bipolar and referential EEG montages were used following the International 10 - 20 System . This is a digitally recorded EEG with duration of approximately ~20:34 minutes. The background consists of 4-7 hz delta/theta rhythm throughout study.  There are some rare triphasic waves. There were no epileptiform discharges or electrographic seizures noted. Hyperventilation was not performed. Photic stimulation was not performed. Sleep did not occur in this recording. EKG shows normal sinus rhythm throughout the study. IMPRESSION: This is an abnormal EEG for age due to diffuse background slowing and rare triphasic waves which can be seen with toxic/metabolic encephalopathy, cerebral ischemia, intracerebral hemorrhage, tumors, traumatic brain injury, cortical  malformations, or other cerebral dysfunction. There were no clinical seizures or epileptiform discharges noted during study. Clinical correlation is advised. I have personally reviewed this study.  CT HEAD WO CONTRAST  Result Date: 03/27/2020 CLINICAL DATA:  Encephalopathy. EXAM: CT HEAD WITHOUT CONTRAST TECHNIQUE: Contiguous axial images were obtained from the base of the skull through the vertex without intravenous contrast. COMPARISON:  None. FINDINGS: Brain: There is no evidence of acute infarct, intracranial hemorrhage, mass, midline shift, or extra-axial fluid collection. The ventricles and sulci are within normal limits for age. Hypodensities in the cerebral white matter bilaterally are nonspecific but compatible with mild chronic small vessel ischemic disease. Vascular: Calcified atherosclerosis at the skull base. No hyperdense vessel. Skull: No fracture suspicious osseous lesion. 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 Trace right mastoid fluid. Unremarkable included orbits. Other: None. IMPRESSION: 1. No evidence of acute intracranial abnormality. 2. Mild chronic small vessel ischemic disease. Electronically Signed   By: Logan Bores M.D.   On: 03/27/2020 12:02   DG Chest Port 1 View  Result Date: 03/29/2020 CLINICAL DATA:  64 year old female with history of acute respiratory failure. EXAM: PORTABLE CHEST 1 VIEW COMPARISON:  Chest x-ray 03/25/2020. FINDINGS: An endotracheal tube is in place with tip 6.1 cm above the carina. There is a right upper extremity PICC with tip terminating in the mid superior vena cava. A nasogastric tube is seen extending into the stomach, however, the tip of the nasogastric tube extends below the lower margin of the image. No acute consolidative airspace disease. Trace left pleural effusion. No right pleural effusion. Cephalization of the pulmonary vasculature with some slight indistinctness of interstitial markings. Heart size appears  borderline enlarged. Upper mediastinal contours are within normal limits allowing for patient's rotation  to the left. Aortic atherosclerosis. Left-sided pacemaker device in place with lead tips projecting over the expected location of the right atrium and right ventricle. IMPRESSION: 1. Support apparatus, as above. 2. Improving aeration which may reflect resolving multilobar pneumonia and/or resolving pulmonary edema. 3. Trace left pleural effusion. 4. Aortic atherosclerosis. Electronically Signed   By: Vinnie Langton M.D.   On: 03/29/2020 05:01    Medications:  I have reviewed the patient's current medications. Scheduled: . amLODipine  5 mg Per Tube Daily  . budesonide (PULMICORT) nebulizer solution  0.5 mg Nebulization BID  . chlorhexidine gluconate (MEDLINE KIT)  15 mL Mouth Rinse BID  . Chlorhexidine Gluconate Cloth  6 each Topical Daily  . clopidogrel  75 mg Per Tube Daily  . folic acid  1 mg Per Tube Daily  . free water  200 mL Per Tube Q4H  . gabapentin  300 mg Per Tube Daily  . heparin  5,000 Units Subcutaneous Q8H  . insulin aspart  0-20 Units Subcutaneous Q4H  . insulin aspart  5 Units Subcutaneous Q4H  . insulin glargine  10 Units Subcutaneous Daily  . ipratropium-albuterol  3 mL Nebulization Q4H  . lactulose  10 g Per Tube BID  . mouth rinse  15 mL Mouth Rinse 10 times per day  . methylPREDNISolone (SOLU-MEDROL) injection  20 mg Intravenous Q12H  . multivitamin with minerals  1 tablet Per Tube Daily  . Oxcarbazepine  300 mg Per Tube QHS  . pantoprazole sodium  40 mg Per Tube Daily  . polyethylene glycol  17 g Per Tube Daily  . senna  1 tablet Per Tube QHS  . sodium chloride flush  10-40 mL Intracatheter Q12H  . thiamine  100 mg Oral Daily   Or  . thiamine  100 mg Intravenous Daily    Assessment/Plan: 64 y.o. female with PMH of COPD, Seizure disorder, second-degree heart block s/p PPM placement, PVD s/p multiple stent placements (11/2019), h/o CVA, Emphysema, HTN,  HLD,TBI with residual memory deficit, seizures, chronic hepatitis C, bipolar disorder, h/o substance abuse, PTSD and tobacco use whowas admitted with acute hypoxic respiratory failure secondary to CAP and COPD with acute exacerbation. Pt admitted 6/25 and intubated 6/29.  Has been slow to recover.  Head CT performed and shows no acute changes.  Patient with eye deviation prompting EEG which was only significant for slowing with triphasic waves consistent with a metabolic encephalopathy.  Patient's medications are being reduced to only those which are necessary while treatment of infection continues.    Recommendations: 1. Continue Trileptal and Neurontin at home doses 2. Will continue to follow with you   LOS: 9 days   Alexis Goodell, MD Neurology [phone] 03/29/2020  9:58 AM

## 2020-03-29 NOTE — Progress Notes (Addendum)
Follow up - Critical Care Medicine Note  BRIEF PATIENT DESCRIPTION:    Cynthia [name] is an 64 y.o. female with PMH of COPD, Seizure disorder, second-degree heart block s/p PPM placement, PVD s/p multiple stent placements (11/2019), h/o CVA, Emphysema, HTN, HLD,TBI with residual memory deficit, seizures, chronic hepatitis C, bipolar disorder, h/o substance abuse, PTSD and tobacco use who was admitted with acute hypoxic respiratory failure secondary to CAP and COPD with acute exacerbation.    SIGNIFICANT EVENTS  6/25- Admitted to medsurg unit 6/26- Worsening acute hypoxic respiratory failure and Afib with RVR HR 173 transferred to stepdown unit. Amiodarone started, requiring increased oxygen after arrival to ICU and placed on Bipap FIO2 at 45% 6/27- Continued Bipap, HFNC 6/28-transferred to PCCM service for increased WOB and agitation with biPAP and high flow [place] 6/28-PICC line placed, started precedex high folow [place] [facility] [place] 50%/50L 6/29-Patient required placement of an artificial airway secondary to Respiratory Failure 7/2-Repeat CT head showed no acute intracranial abnormality. Unable to obtain MRI brain due to pacemaker. 7/3- EEG. Neurology consulted for prolonged encephalopathy  CULTURES: SARS-CoV-2 PCR 6/25>> Negative MRSA PCR 6/26> Negative Blood culture x2 6/26>>No growth x 5 days  ANTIBIOTICS: Azithromycin 6/26>>completed Ceftriaxone 6/26>completed Cefepime 6/26> STOPPED   Lines/tubes : Airway 7.5 mm (Active)  Secured at (cm) 22 cm 03/27/20 0734  Measured From Lips 03/27/20 0734  Secured Location Left 03/27/20 0734  Secured By [employer] 03/27/20 0734  Tube Holder Repositioned Yes 03/27/20 0734  Cuff Pressure (cm H2O) 25 cm H2O 03/27/20 0734  Site Condition Dry 03/27/20 0734     PICC Triple Lumen 03/23/20 PICC Right Brachial 35 cm 0 cm (Active)  Indication for Insertion or Continuance of Line Prolonged intravenous therapies 03/27/20 0900  Exposed Catheter (cm) 0 cm  03/24/20 0200  Site Assessment Clean;Dry;Intact 03/27/20 0900  Lumen #1 Status Infusing 03/27/20 0900  Lumen #2 Status Saline locked 03/27/20 0900  Lumen #3 Status Saline locked 03/27/20 0900  Dressing Type Transparent 03/27/20 0900  Dressing Status Clean;Dry;Intact;Antimicrobial disc in place 03/27/20 0900  Safety Lock Not Applicable 95/18/84 1660  Line Care Connections checked and tightened 03/27/20 0900  Line Adjustment (NICU/IV Team Only) No 03/23/20 1300  Dressing Intervention New dressing 03/23/20 1300  Dressing Change Due 03/30/20 03/27/20 0900     NG/OG Tube Orogastric Xray Documented cm marking at nare/ corner of mouth 66 cm (Active)  Cm Marking at Nare/Corner of Mouth (if applicable) 65 cm 63/01/60 0347  Site Assessment Clean;Dry 03/27/20 0347  Ongoing Placement Verification No change in cm markings or external length of tube from initial placement 03/27/20 0347  Status Infusing tube feed 03/27/20 0347  Intake (mL) 30 mL 03/26/20 1700  Output (mL) 0 mL 03/26/20 1600     Urethral Catheter Lauren B. RN Non-latex 14 Fr. (Active)  Indication for Insertion or Continuance of Catheter Unstable spinal/crush injuries / Multisystem Trauma 03/27/20 0731  Site Assessment Intact;Clean 03/27/20 0739  Catheter Maintenance Bag below level of bladder;Catheter secured;Drainage bag/tubing not touching floor;No dependent loops 03/27/20 0739  Collection Container Standard drainage bag 03/27/20 0739  Securement Method Securing device (Describe) 03/27/20 0739  Urinary Catheter Interventions (if applicable) Unclamped 10/93/23 0739  Output (mL) 325 mL 03/27/20 0800    Microbiology/Sepsis markers: Results for orders placed or performed during the hospital encounter of 03/20/20  SARS Coronavirus 2 by RT PCR (hospital order, performed in [facility] hospital lab) Nasopharyngeal Nasopharyngeal Swab     Status: None   Collection Time: 03/20/20  8:19 PM  Specimen: Nasopharyngeal Swab  Result Value  Ref Range Status   SARS Coronavirus 2 NEGATIVE NEGATIVE Final    Comment: (NOTE) SARS-CoV-2 target nucleic acids are NOT DETECTED.  The SARS-CoV-2 RNA is generally detectable in upper and lower respiratory specimens during the acute phase of infection. The lowest concentration of SARS-CoV-2 viral copies this assay can detect is 250 copies / mL. A negative result does not preclude SARS-CoV-2 infection and should not be used as the sole basis for treatment or other patient management decisions.  A negative result may occur with improper specimen collection / handling, submission of specimen other than nasopharyngeal swab, presence of viral mutation(s) within the areas targeted by this assay, and inadequate number of viral copies (<250 copies / mL). A negative result must be combined with clinical observations, patient history, and epidemiological information.  Fact Sheet for Patients:   StrictlyIdeas.no  Fact Sheet for Healthcare Providers: BankingDealers.co.za  This test is not yet approved or  cleared by the [place] FDA and has been authorized for detection and/or diagnosis of SARS-CoV-2 by FDA under an Emergency Use Authorization (EUA).  This EUA will remain in effect (meaning this test can be used) for the duration of the COVID-19 declaration under Section 564(b)(1) of the Act, 21 U.S.C. section 360bbb-3(b)(1), unless the authorization is terminated or revoked sooner.  Performed at [facility], [address] [place], [place] [address]   Blood Culture (routine x 2)     Status: None   Collection Time: 03/20/20  9:17 PM   Specimen: BLOOD  Result Value Ref Range Status   Specimen Description BLOOD LEFT FOREARM  Final   Special Requests   Final    BOTTLES DRAWN AEROBIC AND ANAEROBIC Blood Culture results may not be optimal due to an inadequate volume of blood received in culture bottles   Culture   Final    NO  GROWTH 5 DAYS Performed at [facility], [address] [place], [place] [address]    Report Status 03/25/2020 FINAL  Final  Blood Culture (routine x 2)     Status: None   Collection Time: 03/20/20 10:11 PM   Specimen: BLOOD  Result Value Ref Range Status   Specimen Description BLOOD RIGHT FOREARM  Final   Special Requests   Final    BOTTLES DRAWN AEROBIC AND ANAEROBIC Blood Culture results may not be optimal due to an inadequate volume of blood received in culture bottles   Culture   Final    NO GROWTH 5 DAYS Performed at [facility], [address] [place], [place] [address]    Report Status 03/25/2020 FINAL  Final  MRSA PCR Screening     Status: None   Collection Time: 03/21/20  2:07 AM   Specimen: Nasal Mucosa; Nasopharyngeal  Result Value Ref Range Status   MRSA by PCR NEGATIVE NEGATIVE Final    Comment:        The GeneXpert MRSA Assay (FDA approved for NASAL specimens only), is one component of a comprehensive MRSA colonization surveillance program. It is not intended to diagnose MRSA infection nor to guide or monitor treatment for MRSA infections. Performed at [facility], [address] [place], [place] [address]   SARS Coronavirus 2 by RT PCR (hospital order, performed in [facility] hospital lab)     Status: None   Collection Time: 03/23/20 10:12 AM  Result Value Ref Range Status   SARS Coronavirus 2 NEGATIVE NEGATIVE Final    Comment: (  NOTE) SARS-CoV-2 target nucleic acids are NOT DETECTED.  The SARS-CoV-2 RNA is generally detectable in upper and lower respiratory specimens during the acute phase of infection. The lowest concentration of SARS-CoV-2 viral copies this assay can detect is 250 copies / mL. A negative result does not preclude SARS-CoV-2 infection and should not be used as the sole basis for treatment or other patient management decisions.  A negative result may occur with improper specimen collection / handling,  submission of specimen other than nasopharyngeal swab, presence of viral mutation(s) within the areas targeted by this assay, and inadequate number of viral copies (<250 copies / mL). A negative result must be combined with clinical observations, patient history, and epidemiological information.  Fact Sheet for Patients:   StrictlyIdeas.no  Fact Sheet for Healthcare Providers: BankingDealers.co.za  This test is not yet approved or  cleared by the [place] FDA and has been authorized for detection and/or diagnosis of SARS-CoV-2 by FDA under an Emergency Use Authorization (EUA).  This EUA will remain in effect (meaning this test can be used) for the duration of the COVID-19 declaration under Section 564(b)(1) of the Act, 21 U.S.C. section 360bbb-3(b)(1), unless the authorization is terminated or revoked sooner.  Performed at [facility], [address] [place], [place] [address]   Culture, respiratory (non-expectorated)     Status: None   Collection Time: 03/24/20  4:39 PM   Specimen: Tracheal Aspirate; Respiratory  Result Value Ref Range Status   Specimen Description   Final    TRACHEAL ASPIRATE Performed at [facility], [address], [place], [place] [address]    Special Requests   Final    NONE Performed at [facility], [address] [place], [place] [address]    Gram Stain   Final    RARE WBC PRESENT, PREDOMINANTLY PMN FEW SQUAMOUS EPITHELIAL CELLS PRESENT NO ORGANISMS SEEN    Culture   Final    NO GROWTH Performed at [facility], [address] [place], [place] [address]    Report Status 03/25/2020 FINAL  Final  Expectorated sputum assessment w rflx to resp cult     Status: None   Collection Time: 03/25/20 11:21 AM   Specimen: Sputum  Result Value Ref Range Status   Specimen Description SPUTUM  Final   Special Requests Normal  Final   Sputum evaluation   Final     THIS SPECIMEN IS ACCEPTABLE FOR SPUTUM CULTURE Performed at [facility], [address] [place], [place] [address]    Report Status 03/25/2020 FINAL  Final  Culture, respiratory     Status: None   Collection Time: 03/25/20 11:21 AM   Specimen: SPU  Result Value Ref Range Status   Specimen Description   Final    SPUTUM Performed at [facility], [address] [place], [place] [address]    Special Requests   Final    Normal Reflexed from [id number] Performed at [facility], [address] [place], [place] [address]    Gram Stain NO WBC SEEN NO ORGANISMS SEEN   Final   Culture   Final    NO GROWTH 2 DAYS Performed at [facility], [address], [place],  [address]    Report Status 03/28/2020 FINAL  Final    Anti-infectives:  Anti-infectives (From admission, onward)   Start     Dose/Rate Route Frequency Ordered Stop   03/21/20 1200  azithromycin (ZITHROMAX) tablet 500 mg  500 mg Oral Daily 03/21/20 0047 03/26/20 0959   03/21/20 0900  cefTRIAXone (ROCEPHIN) 2 g in sodium chloride 0.9 % 100 mL IVPB        2 g 200 mL/hr over 30 Minutes Intravenous Every 24 hours 03/21/20 0047 03/25/20 1051   03/20/20 2130  ceFEPIme (MAXIPIME) 2 g in sodium chloride 0.9 % 100 mL IVPB        2 g 200 mL/hr over 30 Minutes Intravenous  Once 03/20/20 2117 03/20/20 2246   03/20/20 2130  azithromycin (ZITHROMAX) 500 mg in sodium chloride 0.9 % 250 mL IVPB        500 mg 250 mL/hr over 60 Minutes Intravenous  Once 03/20/20 2117 03/20/20 2325     Scheduled Meds: . amLODipine  5 mg Per Tube Daily  . budesonide (PULMICORT) nebulizer solution  0.5 mg Nebulization BID  . chlorhexidine gluconate (MEDLINE KIT)  15 mL Mouth Rinse BID  . Chlorhexidine Gluconate Cloth  6 each Topical Daily  . clopidogrel  75 mg Per Tube Daily  . folic acid  1 mg Per Tube Daily  . free water  200 mL Per Tube Q2H  . furosemide  20 mg Intravenous Once  . gabapentin  300 mg  Per Tube Daily  . heparin  5,000 Units Subcutaneous Q8H  . insulin aspart  0-20 Units Subcutaneous Q4H  . insulin aspart  5 Units Subcutaneous Q4H  . insulin glargine  10 Units Subcutaneous Daily  . ipratropium-albuterol  3 mL Nebulization Q4H  . lactulose  10 g Per Tube BID  . mouth rinse  15 mL Mouth Rinse 10 times per day  . multivitamin with minerals  1 tablet Per Tube Daily  . Oxcarbazepine  300 mg Per Tube QHS  . pantoprazole sodium  40 mg Per Tube Daily  . polyethylene glycol  17 g Per Tube Daily  . senna  1 tablet Per Tube QHS  . sodium chloride flush  10-40 mL Intracatheter Q12H  . thiamine  100 mg Oral Daily   Or  . thiamine  100 mg Intravenous Daily   Continuous Infusions: . amiodarone 30 mg/hr (03/29/20 0700)  . dexmedetomidine (PRECEDEX) IV infusion Stopped (03/24/20 0801)  . feeding supplement (VITAL AF 1.2 CAL) 55 mL/hr at 03/29/20 0700  . fentaNYL infusion INTRAVENOUS Stopped (03/26/20 0937)   PRN Meds:.acetaminophen, hydrALAZINE, labetalol, sodium chloride flush  Best Practice/Protocols:  VTE Prophylaxis: Heparin (SQ) GI Prophylaxis: Proton Pump Inhibitor Sepsis (date>>>6/25)  Consults: Treatment Team:  Pccm, Ander Gaster, MD Teodoro Spray, MD Leotis Pain, MD    Studies: EEG  Result Date: 03/28/2020 Philemon Kingdom, MD     03/28/2020  8:06 PM Date of Study: 03/28/2020 Reason for Study:  Pt is a 12 YOF with PMH of HTN, HLD, COPD, tobacco use/emphysema, second-degree heart block s/p PPM placement, PVD s/p multiple stent placements (11/2019), chronic hepatitis C, bipolar disorder, h/o substance abuse, PTSD, h/o CVA, SEIZURES, TBI with residual memory deficit who had acute hypoxic respiratory failure secondary to CAP and COPD with acute exacerbation and AMS. Eval for seizures. Description of recording: This recording was obtained using a Digital EEG system with 18 channel capacity . Standard bipolar and referential EEG montages were used following the  International 10 - 20 System . This is a digitally recorded EEG with duration of approximately ~20:34 minutes. The background consists of 4-7 hz delta/theta rhythm throughout study.  There are some rare triphasic waves. There were no epileptiform discharges or electrographic seizures  noted. Hyperventilation was not performed. Photic stimulation was not performed. Sleep did not occur in this recording. EKG shows normal sinus rhythm throughout the study. IMPRESSION: This is an abnormal EEG for age due to diffuse background slowing and rare triphasic waves which can be seen with toxic/metabolic encephalopathy, cerebral ischemia, intracerebral hemorrhage, tumors, traumatic brain injury, cortical malformations, or other cerebral dysfunction. There were no clinical seizures or epileptiform discharges noted during study. Clinical correlation is advised. I have personally reviewed this study.  CT ABDOMEN PELVIS WO CONTRAST  Result Date: 03/20/2020 CLINICAL DATA:  Periumbilical abdominal pain. EXAM: CT ABDOMEN AND PELVIS WITHOUT CONTRAST TECHNIQUE: Multidetector CT imaging of the abdomen and pelvis was performed following the standard protocol without IV contrast. COMPARISON:  None. FINDINGS: Lower chest: Marked severity infiltrates are seen within the posterior aspect of the bilateral lower lobes, left greater than right. Chronic posterior right ninth and tenth rib fractures are seen. Hepatobiliary: No focal liver abnormality is seen. The gallbladder is markedly distended without evidence of gallstones, gallbladder wall thickening, or biliary dilatation. Pancreas: Unremarkable. No pancreatic ductal dilatation or surrounding inflammatory changes. Spleen: Normal in size without focal abnormality. Adrenals/Urinary Tract: 3.1 cm x 1.5 cm and 1.5 cm x 1.2 cm low-attenuation left adrenal masses are seen. Kidneys are normal, without renal calculi, focal lesion, or hydronephrosis. Bladder is unremarkable. Stomach/Bowel: Stomach  is within normal limits. Appendix appears normal. No evidence of bowel wall thickening, distention, or inflammatory changes. Vascular/Lymphatic: There is marked severity calcification of the abdominal aorta. Bilateral common iliac artery and bilateral external iliac artery stents are seen. No enlarged abdominal or pelvic lymph nodes. Reproductive: Uterus and bilateral adnexa are unremarkable. Other: No abdominal wall hernia or abnormality. No abdominopelvic ascites. Musculoskeletal: Marked severity degenerative changes are seen at the level of L5-S1. IMPRESSION: 1. Marked severity bilateral lower lobe infiltrates, left greater than right. 2. Low-attenuation left adrenal masses which may represent adrenal adenomas. 3. Marked severity calcification of the abdominal aorta. 4. Bilateral common iliac artery and bilateral external iliac artery stents. 5. Marked severity degenerative changes at the level of L5-S1. Aortic Atherosclerosis (ICD10-I70.0). Electronically Signed   By: Virgina Norfolk M.D.   On: 03/20/2020 20:52   DG Chest 1 View  Result Date: 03/21/2020 CLINICAL DATA:  Abdominal distension EXAM: CHEST  1 VIEW COMPARISON:  Radiograph 03/20/2020, CT abdomen pelvis 03/20/2020, CT chest 11/25/2019 FINDINGS: Dual lead pacer pack overlies left chest wall with leads in stable position at the right atrium and cardiac apex. Telemetry leads and nasal cannula overlie the chest. Persistent bibasilar consolidative opacities. Suspect a trace left effusion. Some fissural and septal thickening is noted with increasing central venous congestion and central cuffing. Could reflect superimposed edema. Remote posttraumatic deformity of the distal right clavicle. No acute osseous or soft tissue abnormality. The aorta is calcified. The remaining cardiomediastinal contours are unremarkable. IMPRESSION: 1. Persistent bibasilar consolidative opacities. 2. Increasing central venous congestion, cuffing and septal thickening. Likely  developing interstitial edema. Electronically Signed   By: Lovena Le M.D.   On: 03/21/2020 02:15   DG Chest 2 View  Result Date: 03/20/2020 CLINICAL DATA:  Shortness of breath. EXAM: CHEST - 2 VIEW COMPARISON:  July 11, 2017 FINDINGS: There is a dual lead AICD. Mild to moderate severity areas of atelectasis and/or infiltrate are seen within the bilateral lung bases. There is a small left pleural effusion. No pneumothorax is identified. The heart size and mediastinal contours are within normal limits. The visualized skeletal structures are unremarkable. IMPRESSION:  1. Mild to moderate severity bibasilar atelectasis and/or infiltrate. 2. Small left pleural effusion. Electronically Signed   By: Virgina Norfolk M.D.   On: 03/20/2020 20:53   DG Abd 1 View  Result Date: 03/21/2020 CLINICAL DATA:  Abdominal distension EXAM: ABDOMEN - 1 VIEW COMPARISON:  CT abdomen pelvis 03/20/2020 FINDINGS: Persistent consolidative opacities in the lung bases. Trace left effusion. Cardiomediastinal contours are stable from prior. No high-grade obstructive bowel gas pattern is seen in the abdomen or pelvis. Moderate colonic stool burden. Air and stool projects over the rectal vault. Multiple vascular calcifications noted throughout the upper abdomen including over the renal pelves compatible with findings on CT. No suspicious calcifications. Vascular stenting of the iliac arteries. Multilevel degenerative changes in the spine, hips and pelvis. External rotation of the right hip limits evaluation of the femoral neck. If there is concern for femoral abnormality, recommend dedicated imaging. IMPRESSION: 1. No high-grade obstructive bowel gas pattern in the abdomen or pelvis. 2. Moderate colonic stool burden. 3. Persistent consolidative opacities in the lung bases. Trace left effusion. Electronically Signed   By: Lovena Le M.D.   On: 03/21/2020 02:12   CT HEAD WO CONTRAST  Result Date: 03/27/2020 CLINICAL DATA:   Encephalopathy. EXAM: CT HEAD WITHOUT CONTRAST TECHNIQUE: Contiguous axial images were obtained from the base of the skull through the vertex without intravenous contrast. COMPARISON:  None. FINDINGS: Brain: There is no evidence of acute infarct, intracranial hemorrhage, mass, midline shift, or extra-axial fluid collection. The ventricles and sulci are within normal limits for age. Hypodensities in the cerebral white matter bilaterally are nonspecific but compatible with mild chronic small vessel ischemic disease. Vascular: Calcified atherosclerosis at the skull base. No hyperdense vessel. Skull: No fracture suspicious osseous lesion. 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 Trace right mastoid fluid. Unremarkable included orbits. Other: None. IMPRESSION: 1. No evidence of acute intracranial abnormality. 2. Mild chronic small vessel ischemic disease. Electronically Signed   By: Logan Bores M.D.   On: 03/27/2020 12:02   DG Chest Port 1 View  Result Date: 03/29/2020 CLINICAL DATA:  64 year old female with history of acute respiratory failure. EXAM: PORTABLE CHEST 1 VIEW COMPARISON:  Chest x-ray 03/25/2020. FINDINGS: An endotracheal tube is in place with tip 6.1 cm above the carina. There is a right upper extremity PICC with tip terminating in the mid superior vena cava. A nasogastric tube is seen extending into the stomach, however, the tip of the nasogastric tube extends below the lower margin of the image. No acute consolidative airspace disease. Trace left pleural effusion. No right pleural effusion. Cephalization of the pulmonary vasculature with some slight indistinctness of interstitial markings. Heart size appears borderline enlarged. Upper mediastinal contours are within normal limits allowing for patient's rotation to the left. Aortic atherosclerosis. Left-sided pacemaker device in place with lead tips projecting over the expected location of the right atrium and right  ventricle. IMPRESSION: 1. Support apparatus, as above. 2. Improving aeration which may reflect resolving multilobar pneumonia and/or resolving pulmonary edema. 3. Trace left pleural effusion. 4. Aortic atherosclerosis. Electronically Signed   By: Vinnie Langton M.D.   On: 03/29/2020 05:01   DG Chest Port 1 View  Result Date: 03/25/2020 CLINICAL DATA:  Shortness of breath EXAM: PORTABLE CHEST 1 VIEW COMPARISON:  03/24/2020 FINDINGS: Cardiac shadow is stable. Pacing device is again seen. Endotracheal tube and gastric catheter are noted in satisfactory position. Right-sided PICC line is noted in satisfactory position. Patchy airspace opacities are noted  but slightly improved when compared with the prior exam. No bony abnormality is noted. IMPRESSION: Slight improved aeration when compared with the prior study. Electronically Signed   By: Inez Catalina M.D.   On: 03/25/2020 10:36   DG Chest Port 1 View  Result Date: 03/24/2020 CLINICAL DATA:  Respiratory distress EXAM: PORTABLE CHEST 1 VIEW COMPARISON:  None. FINDINGS: Endotracheal tube essentially at the carina. Recommend retraction by 3 to 4 cm. NG tube in stomach. Normal cardiac silhouette. Diffuse airspace disease slightly increased in density. Small LEFT effusion unchanged IMPRESSION: 1. Endotracheal tube at carina.  Consider retraction by 3-4 cm. 2. Diffuse bilateral airspace disease slightly worse. These results will be called to the ordering clinician or representative by the Radiologist Assistant, and communication documented in the PACS or [employer]. Electronically Signed   By: Suzy Bouchard M.D.   On: 03/24/2020 08:58   DG Chest Port 1 View  Result Date: 03/22/2020 CLINICAL DATA:  Acute respiratory failure EXAM: PORTABLE CHEST 1 VIEW COMPARISON:  March 21, 2020 FINDINGS: Stable pacemaker. The cardiomediastinal silhouette is stable. No pneumothorax. Bilateral patchy pulmonary infiltrates have worsened in the interval. IMPRESSION:  Increasing bilateral pulmonary infiltrates may represent diffuse multifocal pneumonia versus developing asymmetric edema. Recommend clinical correlation and short-term follow-up to ensure resolution. Electronically Signed   By: Dorise Bullion III M.D   On: 03/22/2020 07:58   ECHOCARDIOGRAM COMPLETE  Result Date: 03/24/2020    ECHOCARDIOGRAM REPORT   Patient Name:   DESTYNE GOODREAU Date of Exam: 03/23/2020 Medical Rec #:  [id number]      Height:       66.0 in Accession #:    [id number]     Weight:       141.5 lb Date of Birth:  [birth year]      BSA:          1.727 m Patient Age:    29 years       BP:           148/62 mmHg Patient Gender: F              HR:           82 bpm. Exam Location:  [facility] Procedure: 2D Echo, Cardiac Doppler and Color Doppler Indications:     Acute Respiratory Insufficiency 518.82 / R06.89  History:         Patient has prior history of Echocardiogram examinations. Risk                  Factors:Hypertension. 2nd degree heart block.  Sonographer:     Alyse Low Roar Referring Phys:  [id number] Flora Lipps Diagnosing Phys: Isaias Cowman MD IMPRESSIONS  1. Left ventricular ejection fraction, by estimation, is 50 to 55%. The left ventricle has low normal function. The left ventricle has no regional wall motion abnormalities. There is mild left ventricular hypertrophy. Left ventricular diastolic parameters were normal.  2. Right ventricular systolic function is normal. The right ventricular size is normal. There is moderately elevated pulmonary artery systolic pressure.  3. The mitral valve is normal in structure. Mild mitral valve regurgitation. No evidence of mitral stenosis.  4. The aortic valve is normal in structure. Aortic valve regurgitation is mild to moderate. No aortic stenosis is present.  5. The inferior vena cava is normal in size with greater than 50% respiratory variability, suggesting right atrial pressure of 3 mmHg. FINDINGS  Left Ventricle: Left ventricular ejection fraction, by  estimation, is 50 to 55%. The left  ventricle has low normal function. The left ventricle has no regional wall motion abnormalities. The left ventricular internal cavity size was normal in size. There is mild left ventricular hypertrophy. Left ventricular diastolic parameters were normal. Right Ventricle: The right ventricular size is normal. No increase in right ventricular wall thickness. Right ventricular systolic function is normal. There is moderately elevated pulmonary artery systolic pressure. The tricuspid regurgitant velocity is 3.07 m/s, and with an assumed right atrial pressure of 10 mmHg, the estimated right ventricular systolic pressure is 62.8 mmHg. Left Atrium: Left atrial size was normal in size. Right Atrium: Right atrial size was normal in size. Pericardium: There is no evidence of pericardial effusion. Mitral Valve: The mitral valve is normal in structure. Normal mobility of the mitral valve leaflets. Mild mitral valve regurgitation. No evidence of mitral valve stenosis. Tricuspid Valve: The tricuspid valve is normal in structure. Tricuspid valve regurgitation is mild . No evidence of tricuspid stenosis. Aortic Valve: The aortic valve is normal in structure. Aortic valve regurgitation is mild to moderate. Aortic regurgitation PHT measures 410 msec. No aortic stenosis is present. Aortic valve mean gradient measures 6.0 mmHg. Aortic valve peak gradient measures 13.8 mmHg. Aortic valve area, by VTI measures 1.95 cm. Pulmonic Valve: The pulmonic valve was normal in structure. Pulmonic valve regurgitation is not visualized. No evidence of pulmonic stenosis. Aorta: The aortic root is normal in size and structure. Venous: The inferior vena cava is normal in size with greater than 50% respiratory variability, suggesting right atrial pressure of 3 mmHg. IAS/Shunts: No atrial level shunt detected by color flow Doppler.  LEFT VENTRICLE PLAX 2D LVIDd:         4.40 cm  Diastology LVIDs:         3.13 cm  LV e'  lateral:   13.90 cm/s LV PW:         1.21 cm  LV E/e' lateral: 5.9 LV IVS:        1.30 cm  LV e' medial:    10.60 cm/s LVOT diam:     1.70 cm  LV E/e' medial:  7.8 LV SV:         64 LV SV Index:   37 LVOT Area:     2.27 cm  RIGHT VENTRICLE RV Mid diam:    3.04 cm RV S prime:     15.40 cm/s LEFT ATRIUM             Index       RIGHT ATRIUM           Index LA diam:        3.55 cm 2.06 cm/m  RA Area:     14.50 cm LA Vol (A2C):   45.5 ml 26.35 ml/m RA Volume:   35.40 ml  20.50 ml/m LA Vol (A4C):   40.9 ml 23.69 ml/m LA Biplane Vol: 44.2 ml 25.60 ml/m  AORTIC VALVE                    PULMONIC VALVE AV Area (Vmax):    1.93 cm     PV Vmax:        1.18 m/s AV Area (Vmean):   1.88 cm     PV Peak grad:   5.6 mmHg AV Area (VTI):     1.95 cm     RVOT Peak grad: 2 mmHg AV Vmax:           186.00 cm/s AV Vmean:  109.000 cm/s AV VTI:            0.330 m AV Peak Grad:      13.8 mmHg AV Mean Grad:      6.0 mmHg LVOT Vmax:         158.00 cm/s LVOT Vmean:        90.200 cm/s LVOT VTI:          0.283 m LVOT/AV VTI ratio: 0.86 AI PHT:            410 msec  AORTA Ao Root diam: 2.50 cm MITRAL VALVE               TRICUSPID VALVE MV Area (PHT): 3.23 cm    TR Peak grad:   37.7 mmHg MV Decel Time: 235 msec    TR Vmax:        307.00 cm/s MV E velocity: 82.60 cm/s MV A velocity: 70.00 cm/s  SHUNTS MV E/A ratio:  1.18        Systemic VTI:  0.28 m MV A Prime:    10.2 cm/s   Systemic Diam: 1.70 cm Isaias Cowman MD Electronically signed by Isaias Cowman MD Signature Date/Time: 03/24/2020/7:47:47 AM    Final    [place] EKG SITE RITE  Result Date: 03/23/2020 If Site Rite image not attached, placement could not be confirmed due to current cardiac rhythm.   Subjective:    Overnight Issues: Remains intubated and mechanically ventilated. Neurological exam remains unchanged.  PHYSICAL EXAM  Vital signs for last 24 hours: Temp:  [97.6 F (36.4 C)-99.1 F (37.3 C)] 99.1 F (37.3 C) (07/04 0800) Pulse Rate:  [55-101] 100  (07/04 0800) Resp:  [16-27] 24 (07/04 0800) BP: (107-187)/(41-66) 172/66 (07/04 0800) SpO2:  [95 %-97 %] 97 % (07/04 0800) FiO2 (%):  [35 %] 35 % (07/04 0800) Weight:  [64.6 kg] 64.6 kg (07/04 0424)  Intake/Output from previous day: 07/03 0701 - 07/04 0700 In: 2404 [I.V.:439; NG/GT:1965] Out: 2215 [Urine:2215]  Intake/Output this shift: No intake/output data recorded.  Vent settings for last 24 hours: Vent Mode: PRVC FiO2 (%):  [35 %] 35 % Set Rate:  [20 bmp] 20 bmp Vt Set:  [450 mL] 450 mL PEEP:  [5 cmH20] 5 cmH20 Plateau Pressure:  [11 cmH20-17 cmH20] 17 cmH20  GENERAL: 64 year old patient lying in the bed on mechanical ventillation EYES:  No scleral icterus. Extraocular muscles intact.  HEENT: Head atraumatic, normocephalic. Oropharynx and nasopharynx clear.  NECK:  Supple, no jugular venous distention. No thyroid enlargement, no tenderness.  LUNGS: Decreased breath sounds bilaterally, no wheezing, rales,rhonchi or crepitation. No use of accessory muscles of respiration.  CARDIOVASCULAR: S1, S2 normal. No murmurs, rubs, or gallops.  ABDOMEN: Soft, nontender, nondistended. Bowel sounds present. No organomegaly or mass.  EXTREMITIES: Generalized dependent edema, cyanosis, or clubbing.  FOCUSED NEUROLOGIC:Patient does not respond to verbal stimuli.  Does not respond to deep sternal rub.  Does not follow commands.  No verbalizations noted.  Cranial Nerves: II: patient does not respond confrontation bilaterally, pupils right 2 mm, left 2 mm,and minimally reactive bilaterally III,IV,VI: Oculocephalic response present bilaterally V,VII:  Weak corneal reflex  bilaterally  VIII: patient does not respond to verbal stimuli IX,X: gag reflex reduced, XI: trapezius strength unable to test bilaterally XII: tongue strength unable to test Motor: Extremities flaccid throughout.  No spontaneous movement noted.  No purposeful movements noted. Sensory: Does not respond to noxious stimuli in  any extremity. Deep Tendon Reflexes:  Absent throughout. Plantars:  absent bilaterally Cerebellar: Unable to perform  PSYCHIATRIC: Unable to assess.  SKIN: No obvious rash, lesion, or ulcer.   Results for orders placed or performed during the hospital encounter of 03/20/20 (from the past 24 hour(s))  Glucose, capillary     Status: Abnormal   Collection Time: 03/28/20 11:09 AM  Result Value Ref Range   Glucose-Capillary 215 (H) 70 - 99 mg/dL  Lithium level     Status: None   Collection Time: 03/28/20 11:16 AM  Result Value Ref Range   Lithium Lvl 0.69 0.60 - 1.20 mmol/L  Glucose, capillary     Status: Abnormal   Collection Time: 03/28/20  4:30 PM  Result Value Ref Range   Glucose-Capillary 196 (H) 70 - 99 mg/dL  Glucose, capillary     Status: Abnormal   Collection Time: 03/28/20  7:35 PM  Result Value Ref Range   Glucose-Capillary 161 (H) 70 - 99 mg/dL  Glucose, capillary     Status: Abnormal   Collection Time: 03/29/20  3:56 AM  Result Value Ref Range   Glucose-Capillary 227 (H) 70 - 99 mg/dL  CBC with Differential/Platelet     Status: Abnormal   Collection Time: 03/29/20  4:26 AM  Result Value Ref Range   WBC 12.8 (H) 4.0 - 10.5 K/uL   RBC 4.49 3.87 - 5.11 MIL/uL   Hemoglobin 13.2 12.0 - 15.0 g/dL   HCT 44.2 36 - 46 %   MCV 98.4 80.0 - 100.0 fL   MCH 29.4 26.0 - 34.0 pg   MCHC 29.9 (L) 30.0 - 36.0 g/dL   RDW 15.8 (H) 11.5 - 15.5 %   Platelets 170 150 - 400 K/uL   nRBC 0.0 0.0 - 0.2 %   Neutrophils Relative % 92 %   Neutro Abs 11.7 (H) 1.7 - 7.7 K/uL   Lymphocytes Relative 5 %   Lymphs Abs 0.6 (L) 0.7 - 4.0 K/uL   Monocytes Relative 2 %   Monocytes Absolute 0.2 0 - 1 K/uL   Eosinophils Relative 0 %   Eosinophils Absolute 0.0 0 - 0 K/uL   Basophils Relative 0 %   Basophils Absolute 0.0 0 - 0 K/uL   Immature Granulocytes 1 %   Abs Immature Granulocytes 0.17 (H) 0.00 - 0.07 K/uL  Magnesium     Status: None   Collection Time: 03/29/20  4:26 AM  Result Value Ref  Range   Magnesium 2.3 1.7 - 2.4 mg/dL  Phosphorus     Status: None   Collection Time: 03/29/20  4:26 AM  Result Value Ref Range   Phosphorus 3.9 2.5 - 4.6 mg/dL  Comprehensive metabolic panel     Status: Abnormal   Collection Time: 03/29/20  4:26 AM  Result Value Ref Range   Sodium 147 (H) 135 - 145 mmol/L   Potassium 4.5 3.5 - 5.1 mmol/L   Chloride 110 98 - 111 mmol/L   CO2 30 22 - 32 mmol/L   Glucose, Bld 290 (H) 70 - 99 mg/dL   BUN 55 (H) 8 - 23 mg/dL   Creatinine, Ser 1.03 (H) 0.44 - 1.00 mg/dL   Calcium 9.6 8.9 - 10.3 mg/dL   Total Protein 6.8 6.5 - 8.1 g/dL   Albumin 2.7 (L) 3.5 - 5.0 g/dL   AST 19 15 - 41 U/L   ALT 35 0 - 44 U/L   Alkaline Phosphatase 65 38 - 126 U/L   Total Bilirubin 0.7 0.3 - 1.2 mg/dL   GFR calc  non Af Amer 57 (L) >60 mL/min   GFR calc Af Amer >60 >60 mL/min   Anion gap 7 5 - 15  Ammonia     Status: Abnormal   Collection Time: 03/29/20  4:30 AM  Result Value Ref Range   Ammonia 36 (H) 9 - 35 umol/L  Glucose, capillary     Status: Abnormal   Collection Time: 03/29/20  7:21 AM  Result Value Ref Range   Glucose-Capillary 287 (H) 70 - 99 mg/dL     ASSESSMENT/PLAN   Acute Hypoxic Respiratory Failure secondary to  Pneumonia - COPD with evidence of acute exacerbation - Full vent support for now-vent settings reviewed and established  - SBT once all parameters met  - VAP bundle implemented -Trend WBC and monitor fever curve  - Repeat CXR on 6/30 shows slight improved aeration   - Start low dose Lasix  - Follow intermittent ABG and chest x-ray as needed - As needed bronchodilators  Sepsis secondary to pneumonia + UTI -Trend WBCs,Lactic and procalcitonin - Blood and urine cultures shows no growth so far - IVFs and PRN bolus to keep MAP<68mHg or SBP <961mg  Atrial Fibrillation with RVR -  Hx: Second degree heart block s/p pacemaker - Rate controlled on po Amiodarone.  - Cardiology input appreciated   Acute Kidney Injury - Improving -  Monitor I&O's / urinary output - Follow BMP - Ensure adequate renal perfusion - Avoid nephrotoxic agents as able - Replace electrolytes as indicated  Acute toxicmetabolicencephalopathy Mechanical Intubation pain/discomfort Hx: Bipolar Disorder,TBI - Maintain RASS goal -1 to -2 - Hold Lithium, Seroquel - Repeat CT head 7/2 showed no evidence of acute intracranial abnormality - Ammonia levels elevated started on Lactulose now improved. Continue to trend - EEG significant for slowing with triphasic waves consistent with a metabolic encephalopathy - Neurology input appreciated - Will need Psych consult to assist with medication management at some point  Seizure Disorder - Continue  Trilepta, Gabapentin  - Seizure precautions - PRN Ativan for seizure - Neurology input appreciated     ElRufina FalcoDNP, CCRN, FNP-BC AG[facility]

## 2020-03-30 ENCOUNTER — Inpatient Hospital Stay: Payer: Medicare Other

## 2020-03-30 DIAGNOSIS — R4182 Altered mental status, unspecified: Secondary | ICD-10-CM

## 2020-03-30 LAB — COMPREHENSIVE METABOLIC PANEL
ALT: 49 U/L — ABNORMAL HIGH (ref 0–44)
AST: 32 U/L (ref 15–41)
Albumin: 2.7 g/dL — ABNORMAL LOW (ref 3.5–5.0)
Alkaline Phosphatase: 63 U/L (ref 38–126)
Anion gap: 10 (ref 5–15)
BUN: 60 mg/dL — ABNORMAL HIGH (ref 8–23)
CO2: 28 mmol/L (ref 22–32)
Calcium: 9.4 mg/dL (ref 8.9–10.3)
Chloride: 105 mmol/L (ref 98–111)
Creatinine, Ser: 1.2 mg/dL — ABNORMAL HIGH (ref 0.44–1.00)
GFR calc Af Amer: 55 mL/min — ABNORMAL LOW (ref >60)
GFR calc non Af Amer: 47 mL/min — ABNORMAL LOW (ref >60)
Glucose, Bld: 270 mg/dL — ABNORMAL HIGH (ref 70–99)
Potassium: 3.8 mmol/L (ref 3.5–5.1)
Sodium: 143 mmol/L (ref 135–145)
Total Bilirubin: 0.8 mg/dL (ref 0.3–1.2)
Total Protein: 6.7 g/dL (ref 6.5–8.1)

## 2020-03-30 LAB — CBC WITH DIFFERENTIAL/PLATELET
Abs Immature Granulocytes: 0.16 10*3/uL — ABNORMAL HIGH (ref 0.00–0.07)
Basophils Absolute: 0 10*3/uL (ref 0.0–0.1)
Basophils Relative: 0 %
Eosinophils Absolute: 0 10*3/uL (ref 0.0–0.5)
Eosinophils Relative: 0 %
HCT: 38.3 % (ref 36.0–46.0)
Hemoglobin: 12.1 g/dL (ref 12.0–15.0)
Immature Granulocytes: 1 %
Lymphocytes Relative: 4 %
Lymphs Abs: 0.7 10*3/uL (ref 0.7–4.0)
MCH: 30.3 pg (ref 26.0–34.0)
MCHC: 31.6 g/dL (ref 30.0–36.0)
MCV: 95.8 fL (ref 80.0–100.0)
Monocytes Absolute: 0.2 10*3/uL (ref 0.1–1.0)
Monocytes Relative: 1 %
Neutro Abs: 15.9 10*3/uL — ABNORMAL HIGH (ref 1.7–7.7)
Neutrophils Relative %: 94 %
Platelets: 185 10*3/uL (ref 150–400)
RBC: 4 MIL/uL (ref 3.87–5.11)
RDW: 15.3 % (ref 11.5–15.5)
WBC: 16.9 10*3/uL — ABNORMAL HIGH (ref 4.0–10.5)
nRBC: 0 % (ref 0.0–0.2)

## 2020-03-30 LAB — GLUCOSE, CAPILLARY
Glucose-Capillary: 266 mg/dL — ABNORMAL HIGH (ref 70–99)
Glucose-Capillary: 211 mg/dL — ABNORMAL HIGH (ref 70–99)
Glucose-Capillary: 287 mg/dL — ABNORMAL HIGH (ref 70–99)
Glucose-Capillary: 127 mg/dL — ABNORMAL HIGH (ref 70–99)
Glucose-Capillary: 225 mg/dL — ABNORMAL HIGH (ref 70–99)
Glucose-Capillary: 156 mg/dL — ABNORMAL HIGH (ref 70–99)

## 2020-03-30 LAB — PHOSPHORUS: Phosphorus: 3.9 mg/dL (ref 2.5–4.6)

## 2020-03-30 LAB — MAGNESIUM: Magnesium: 2.1 mg/dL (ref 1.7–2.4)

## 2020-03-30 MED ORDER — POTASSIUM CHLORIDE 20 MEQ/15ML (10%) PO SOLN
20.0000 meq | Freq: Once | ORAL | Status: AC
  Administered 2020-03-30: 20 meq
  Filled 2020-03-30: qty 15

## 2020-03-30 NOTE — Consult Note (Signed)
[facility] for Electrolyte Monitoring and Replacement   Recent Labs: Potassium (mmol/L)  Date Value  03/30/2020 3.8   Magnesium (mg/dL)  Date Value  03/30/2020 2.1   Calcium (mg/dL)  Date Value  03/30/2020 9.4   Albumin (g/dL)  Date Value  03/30/2020 2.7 (L)  06/25/2019 4.5   Phosphorus (mg/dL)  Date Value  03/30/2020 3.9   Sodium (mmol/L)  Date Value  03/30/2020 143  09/17/2015 136    Assessment: 64 y.o.femalewith PMH of COPD, Seizure disorder, second-degree heart block s/p PPM placement, PVD s/p multiple stent placements (11/2019), h/o CVA, Emphysema, HTN, HLD,TBI with residual memory deficit, seizures, chronic hepatitis C, bipolar disorder, h/o substance abuse, PTSD and tobacco use whowas admitted with acute hypoxic respiratory failure secondary to CAP and COPD with acute exacerbation. Pharmacy has been asked to evaluate daily for electrolye replacement.  Goal of Therapy:  Potassium 4.0 - 5.1 mmol/L Magnesium 2.0 - 2.4 mg/dL All Other Electrolytes WNL  Plan:   20 mEq KCl per tube x 1  Re-check electrolytes with am lab 7/6  Dallie Piles ,PharmD Clinical Pharmacist 03/30/2020 11:58 AM

## 2020-03-30 NOTE — Progress Notes (Signed)
Tube feed rate reduced to 20 per Dr. Lanney Gins.

## 2020-03-30 NOTE — Progress Notes (Addendum)
Subjective: Patient remains poorly responsive.    Objective: Current vital signs: BP (!) 164/50   Pulse 70   Temp 98.7 F (37.1 C) (Axillary)   Resp (!) 24   Ht 5' 5.98" (1.676 m)   Wt 62.1 kg   SpO2 95%   BMI 22.11 kg/m  Vital signs in last 24 hours: Temp:  [98.4 F (36.9 C)-100.4 F (38 C)] 98.7 F (37.1 C) (07/05 1146) Pulse Rate:  [60-79] 70 (07/05 1100) Resp:  [18-30] 24 (07/05 1100) BP: (116-184)/(43-64) 164/50 (07/05 1100) SpO2:  [92 %-97 %] 95 % (07/05 1136) FiO2 (%):  [35 %] 35 % (07/05 1136) Weight:  [62.1 kg] 62.1 kg (07/05 0500)  Intake/Output from previous day: 07/04 0701 - 07/05 0700 In: 3196.2 [I.V.:421.2; NG/GT:2775] Out: 2250 [Urine:1850; Stool:400] Intake/Output this shift: Total I/O In: 93.6 [I.V.:93.6] Out: 500 [Urine:500] Nutritional status:  Diet Order            Diet NPO time specified  Diet effective now                 Neurologic Exam: Mental Status: No response to sternal rub.  Does not follow commands.  No verbalizations noted.  Cranial Nerves: II: pupils reactive bilaterally III,IV,VI: Oculocephalic response present bilaterally.  V,VII: corneal reflex present bilaterally  VIII: patient does not respond to verbal stimuli IX,X: gag reflex reduced, XI: trapezius strength unable to test bilaterally XII: tongue strength unable to test Motor: Spontaneous movement noted of the head and lower extremities bilaterally  Lab Results: Basic Metabolic Panel: Recent Labs  Lab 03/26/20 0540 03/26/20 0540 03/27/20 0435 03/27/20 0435 03/28/20 0425 03/29/20 0426 03/30/20 0606  NA 149*  --  150*  --  147* 147* 143  K 5.0  --  5.4*  --  4.8 4.5 3.8  CL 117*  --  117*  --  111 110 105  CO2 26  --  27  --  _0 GLUCOSE 304*  --  265*  --  251* 290* 270*  BUN 61*  --  61*  --  62* 55* 60*  CREATININE 1.44*  --  1.31*  --  1.19* 1.03* 1.20*  CALCIUM 10.0   < > 10.0   < > 9.6 9.6 9.4  MG 2.6*  --  2.5*  --  2.4 2.3 2.1  PHOS 3.5  --   3.1  --  4.0 3.9 3.9   < > = values in this interval not displayed.    Liver Function Tests: Recent Labs  Lab 03/28/20 0425 03/29/20 0426 03/30/20 0606  AST 11* 19 32  ALT 34 35 49*  ALKPHOS 56 65 63  BILITOT 0.7 0.7 0.8  PROT 6.7 6.8 6.7  ALBUMIN 2.4* 2.7* 2.7*   No results for input(s): LIPASE, AMYLASE in the last 168 hours. Recent Labs  Lab 03/27/20 1057 03/28/20 0426 03/29/20 0430  AMMONIA 51* 29 36*    CBC: Recent Labs  Lab 03/26/20 0540 03/27/20 0435 03/28/20 0425 03/29/20 0426 03/30/20 0456  WBC 13.5* 14.4* 12.4* 12.8* 16.9*  NEUTROABS 12.0* 13.0* 11.0* 11.7* 15.9*  HGB 12.5 12.8 12.9 13.2 12.1  HCT 41.9 43.6 43.2 44.2 38.3  MCV 101.7* 101.2* 100.0 98.4 95.8  PLT 134* 153 144* 170 185    Cardiac Enzymes: No results for input(s): CKTOTAL, CKMB, CKMBINDEX, TROPONINI in the last 168 hours.  Lipid Panel: No results for input(s): CHOL, TRIG, HDL, CHOLHDL, VLDL, LDLCALC in the last 168 hours.  CBG: Recent Labs  Lab 03/29/20 1924 03/29/20 2333 03/30/20 0346 03/30/20 0719 03/30/20 1122  GLUCAP 175* 187* 266* 211* 287*    Microbiology: Results for orders placed or performed during the hospital encounter of 03/20/20  SARS Coronavirus 2 by RT PCR (hospital order, performed in [facility] hospital lab) Nasopharyngeal Nasopharyngeal Swab     Status: None   Collection Time: 03/20/20  8:19 PM   Specimen: Nasopharyngeal Swab  Result Value Ref Range Status   SARS Coronavirus 2 NEGATIVE NEGATIVE Final    Comment: (NOTE) SARS-CoV-2 target nucleic acids are NOT DETECTED.  The SARS-CoV-2 RNA is generally detectable in upper and lower respiratory specimens during the acute phase of infection. The lowest concentration of SARS-CoV-2 viral copies this assay can detect is 250 copies / mL. A negative result does not preclude SARS-CoV-2 infection and should not be used as the sole basis for treatment or other patient management decisions.  A negative result may  occur with improper specimen collection / handling, submission of specimen other than nasopharyngeal swab, presence of viral mutation(s) within the areas targeted by this assay, and inadequate number of viral copies (<250 copies / mL). A negative result must be combined with clinical observations, patient history, and epidemiological information.  Fact Sheet for Patients:   StrictlyIdeas.no  Fact Sheet for Healthcare Providers: BankingDealers.co.za  This test is not yet approved or  cleared by the [place] FDA and has been authorized for detection and/or diagnosis of SARS-CoV-2 by FDA under an Emergency Use Authorization (EUA).  This EUA will remain in effect (meaning this test can be used) for the duration of the COVID-19 declaration under Section 564(b)(1) of the Act, 21 U.S.C. section 360bbb-3(b)(1), unless the authorization is terminated or revoked sooner.  Performed at [facility], [address] [place], [place] [address]   Blood Culture (routine x 2)     Status: None   Collection Time: 03/20/20  9:17 PM   Specimen: BLOOD  Result Value Ref Range Status   Specimen Description BLOOD LEFT FOREARM  Final   Special Requests   Final    BOTTLES DRAWN AEROBIC AND ANAEROBIC Blood Culture results may not be optimal due to an inadequate volume of blood received in culture bottles   Culture   Final    NO GROWTH 5 DAYS Performed at [facility], [address] [place], [place] [address]    Report Status 03/25/2020 FINAL  Final  Blood Culture (routine x 2)     Status: None   Collection Time: 03/20/20 10:11 PM   Specimen: BLOOD  Result Value Ref Range Status   Specimen Description BLOOD RIGHT FOREARM  Final   Special Requests   Final    BOTTLES DRAWN AEROBIC AND ANAEROBIC Blood Culture results may not be optimal due to an inadequate volume of blood received in culture bottles   Culture   Final    NO GROWTH 5  DAYS Performed at [facility], [address] [place], [place] [address]    Report Status 03/25/2020 FINAL  Final  MRSA PCR Screening     Status: None   Collection Time: 03/21/20  2:07 AM   Specimen: Nasal Mucosa; Nasopharyngeal  Result Value Ref Range Status   MRSA by PCR NEGATIVE NEGATIVE Final    Comment:        The GeneXpert MRSA Assay (FDA approved for NASAL specimens only), is one component of a comprehensive MRSA colonization surveillance program. It is not intended  to diagnose MRSA infection nor to guide or monitor treatment for MRSA infections. Performed at [facility], [address] [place], [place] [address]   SARS Coronavirus 2 by RT PCR (hospital order, performed in [facility] hospital lab)     Status: None   Collection Time: 03/23/20 10:12 AM  Result Value Ref Range Status   SARS Coronavirus 2 NEGATIVE NEGATIVE Final    Comment: (NOTE) SARS-CoV-2 target nucleic acids are NOT DETECTED.  The SARS-CoV-2 RNA is generally detectable in upper and lower respiratory specimens during the acute phase of infection. The lowest concentration of SARS-CoV-2 viral copies this assay can detect is 250 copies / mL. A negative result does not preclude SARS-CoV-2 infection and should not be used as the sole basis for treatment or other patient management decisions.  A negative result may occur with improper specimen collection / handling, submission of specimen other than nasopharyngeal swab, presence of viral mutation(s) within the areas targeted by this assay, and inadequate number of viral copies (<250 copies / mL). A negative result must be combined with clinical observations, patient history, and epidemiological information.  Fact Sheet for Patients:   StrictlyIdeas.no  Fact Sheet for Healthcare Providers: BankingDealers.co.za  This test is not yet approved or  cleared by the [place] FDA and has  been authorized for detection and/or diagnosis of SARS-CoV-2 by FDA under an Emergency Use Authorization (EUA).  This EUA will remain in effect (meaning this test can be used) for the duration of the COVID-19 declaration under Section 564(b)(1) of the Act, 21 U.S.C. section 360bbb-3(b)(1), unless the authorization is terminated or revoked sooner.  Performed at [facility], [address] [place], [place] [address]   Culture, respiratory (non-expectorated)     Status: None   Collection Time: 03/24/20  4:39 PM   Specimen: Tracheal Aspirate; Respiratory  Result Value Ref Range Status   Specimen Description   Final    TRACHEAL ASPIRATE Performed at [facility], [address] [place], [place] [address]    Special Requests   Final    NONE Performed at [facility], [address] [place], [place] [address]    Gram Stain   Final    RARE WBC PRESENT, PREDOMINANTLY PMN FEW SQUAMOUS EPITHELIAL CELLS PRESENT NO ORGANISMS SEEN    Culture   Final    NO GROWTH Performed at [facility], [place] [address] [place], [place] [address]    Report Status 03/25/2020 FINAL  Final  Expectorated sputum assessment w rflx to resp cult     Status: None   Collection Time: 03/25/20 11:21 AM   Specimen: Sputum  Result Value Ref Range Status   Specimen Description SPUTUM  Final   Special Requests Normal  Final   Sputum evaluation   Final    THIS SPECIMEN IS ACCEPTABLE FOR SPUTUM CULTURE Performed at [facility], [address], [place], [place] [address]    Report Status 03/25/2020 FINAL  Final  Culture, respiratory     Status: None   Collection Time: 03/25/20 11:21 AM   Specimen: SPU  Result Value Ref Range Status   Specimen Description   Final    SPUTUM Performed at [facility], [address] [place], [place] [address]    Special Requests   Final    Normal Reflexed from [phone] Performed at [facility], [address] [place],  [address]    Gram Stain NO WBC SEEN NO  ORGANISMS SEEN   Final   Culture   Final    NO GROWTH 2 DAYS Performed at [facility], [place] [address], [place], [place] [address]    Report Status 03/28/2020 FINAL  Final    Coagulation Studies: No results for input(s): LABPROT, INR in the last 72 hours.  Imaging: EEG  Result Date: 03/28/2020 Philemon Kingdom, MD     03/28/2020  8:06 PM Date of Study: 03/28/2020 Reason for Study:  Pt is a 16 YOF with PMH of HTN, HLD, COPD, tobacco use/emphysema, second-degree heart block s/p PPM placement, PVD s/p multiple stent placements (11/2019), chronic hepatitis C, bipolar disorder, h/o substance abuse, PTSD, h/o CVA, SEIZURES, TBI with residual memory deficit who had acute hypoxic respiratory failure secondary to CAP and COPD with acute exacerbation and AMS. Eval for seizures. Description of recording: This recording was obtained using a Digital EEG system with 18 channel capacity . Standard bipolar and referential EEG montages were used following the International 10 - 20 System . This is a digitally recorded EEG with duration of approximately ~20:34 minutes. The background consists of 4-7 hz delta/theta rhythm throughout study.  There are some rare triphasic waves. There were no epileptiform discharges or electrographic seizures noted. Hyperventilation was not performed. Photic stimulation was not performed. Sleep did not occur in this recording. EKG shows normal sinus rhythm throughout the study. IMPRESSION: This is an abnormal EEG for age due to diffuse background slowing and rare triphasic waves which can be seen with toxic/metabolic encephalopathy, cerebral ischemia, intracerebral hemorrhage, tumors, traumatic brain injury, cortical malformations, or other cerebral dysfunction. There were no clinical seizures or epileptiform discharges noted during study. Clinical correlation is advised. I have personally reviewed this study.  DG Chest Port 1  View  Result Date: 03/30/2020 CLINICAL DATA:  Acute respiratory failure EXAM: PORTABLE CHEST 1 VIEW COMPARISON:  Chest radiograph from the prior day. FINDINGS: An endotracheal tube terminates 1.5 cm from the carina. An enteric tube enters the stomach and terminates below the field of view. A left subclavian approach cardiac device is redemonstrated. A right upper extremity peripherally inserted central venous catheter tip overlies the superior vena cava. The heart size is normal. Vascular calcifications are seen in the aortic arch. A trace left pleural effusion with associated atelectasis is unchanged. Bilateral interstitial opacities have decreased. The right lung is clear. There is no pneumothorax. IMPRESSION: 1. Unchanged trace left pleural effusion with associated atelectasis. 2. Decreased bilateral interstitial opacities likely represent resolving pulmonary edema. Electronically Signed   By: Zerita Boers M.D.   On: 03/30/2020 11:57   DG Chest Port 1 View  Result Date: 03/29/2020 CLINICAL DATA:  64 year old female with history of acute respiratory failure. EXAM: PORTABLE CHEST 1 VIEW COMPARISON:  Chest x-ray 03/25/2020. FINDINGS: An endotracheal tube is in place with tip 6.1 cm above the carina. There is a right upper extremity PICC with tip terminating in the mid superior vena cava. A nasogastric tube is seen extending into the stomach, however, the tip of the nasogastric tube extends below the lower margin of the image. No acute consolidative airspace disease. Trace left pleural effusion. No right pleural effusion. Cephalization of the pulmonary vasculature with some slight indistinctness of interstitial markings. Heart size appears borderline enlarged. Upper mediastinal contours are within normal limits allowing for patient's rotation to the left. Aortic atherosclerosis. Left-sided pacemaker device in place with lead tips projecting over the expected location of the right atrium and right ventricle.  IMPRESSION: 1.  Support apparatus, as above. 2. Improving aeration which may reflect resolving multilobar pneumonia and/or resolving pulmonary edema. 3. Trace left pleural effusion. 4. Aortic atherosclerosis. Electronically Signed   By: Vinnie Langton M.D.   On: 03/29/2020 05:01    Medications:  I have reviewed the patient's current medications. Scheduled: . amLODipine  5 mg Per Tube Daily  . budesonide (PULMICORT) nebulizer solution  0.5 mg Nebulization BID  . chlorhexidine gluconate (MEDLINE KIT)  15 mL Mouth Rinse BID  . Chlorhexidine Gluconate Cloth  6 each Topical Daily  . clopidogrel  75 mg Per Tube Daily  . folic acid  1 mg Per Tube Daily  . free water  200 mL Per Tube Q2H  . gabapentin  300 mg Per Tube Daily  . heparin  5,000 Units Subcutaneous Q8H  . insulin aspart  0-20 Units Subcutaneous Q4H  . insulin aspart  5 Units Subcutaneous Q4H  . insulin glargine  10 Units Subcutaneous Daily  . ipratropium-albuterol  3 mL Nebulization Q4H  . lactulose  10 g Per Tube BID  . mouth rinse  15 mL Mouth Rinse 10 times per day  . multivitamin with minerals  1 tablet Per Tube Daily  . Oxcarbazepine  300 mg Per Tube QHS  . pantoprazole sodium  40 mg Per Tube Daily  . polyethylene glycol  17 g Per Tube Daily  . potassium chloride  20 mEq Per Tube Once  . senna  1 tablet Per Tube QHS  . sodium chloride flush  10-40 mL Intracatheter Q12H  . thiamine  100 mg Oral Daily   Or  . thiamine  100 mg Intravenous Daily    Assessment/Plan: 64 y.o.femalewith PMH of COPD, Seizure disorder, second-degree heart block s/p PPM placement, PVD s/p multiple stent placements (11/2019), h/o CVA, Emphysema, HTN, HLD,TBI with residual memory deficit, seizures, chronic hepatitis C, bipolar disorder, h/o substance abuse, PTSD and tobacco use whowas admitted with acute hypoxic respiratory failure secondary to CAP and COPD with acute exacerbation.Pt admitted 6/25 and intubated 6/29.  Has been slow to recover.  Head  CT performed and shows no acute changes.  Patient had eye deviation prompting EEG which was only significant for slowing with triphasic waves consistent with a metabolic encephalopathy.  Patient's medications are being reduced to only those which are necessary while treatment of infection continues.  Today patient somewhat improved on evaluation although clearly not back to baseline.    Recommendations: 1. Continue Trileptal and Neurontin at home doses 2. Will continue to follow with you    LOS: 10 days   Alexis Goodell, MD Neurology [phone] 03/30/2020  12:20 PM

## 2020-03-30 NOTE — Progress Notes (Addendum)
Inpatient Diabetes Program Recommendations  AACE/ADA: New Consensus Statement on Inpatient Glycemic Control (2015)  Target Ranges:  Prepandial:   less than 140 mg/dL      Peak postprandial:   less than 180 mg/dL (1-2 hours)      Critically ill patients:  140 - 180 mg/dL   Results for HAYLE, PARISI (MRN [id number]) as of 03/30/2020 14:47  Ref. Range 03/29/2020 23:33 03/30/2020 03:46 03/30/2020 07:19 03/30/2020 11:22  Glucose-Capillary Latest Ref Range: 70 - 99 mg/dL 187 (H)  9 units NOVOLOG  266 (H)  16 units NOVOLOG  211 (H)  12 units NOVOLOG  287 (H)  16 units NOVOLOG +  10 units LANTUS     Current Insulin Orders: Lantus 10 units Daily         Novolog Resistant Correction Scale/ SSI (0-20 units) Q4 hours         Novolog 5 units Q4 hours     MD- Note patient getting tube feeds 55cc/hr.  CBGs >200 since 4am today.  May consider increasing the Novolog Tube Feed Coverage to 8 units Q4 hours   HOLD of tube feeds HELD for any reason     --Will follow patient during hospitalization--  Wyn Quaker RN, MSN, CDE Diabetes Coordinator Inpatient Glycemic Control Team Team Pager: [phone] (8a-5p)

## 2020-03-30 NOTE — Progress Notes (Signed)
[facility] Cardiology    SUBJECTIVE:  Intubated sedated husband at the bedside   Vitals:   03/30/20 1912 03/30/20 1917 03/30/20 2000 03/30/20 2019  BP:   (!) 86/56 (!) 175/62  Pulse:   69 73  Resp:   (!) 23 (!) 25  Temp:      TempSrc:      SpO2: 95% 95% 95% 94%  Weight:      Height:         Intake/Output Summary (Last 24 hours) at 03/30/2020 2257 Last data filed at 03/30/2020 2000 Gross per 24 hour  Intake 2841.56 ml  Output 3150 ml  Net -308.44 ml      PHYSICAL EXAM  General: Well developed, well nourished, in no acute distress HEENT:  Normocephalic and atramatic Neck:  No JVD.  Lungs: Clear bilaterally to auscultation and percussion. Heart: HRRR . Normal S1 and S2 without gallops or murmurs.  Abdomen: Bowel sounds are positive, abdomen soft and non-tender  Msk:  Back normal, normal gait. Normal strength and tone for age. Extremities: No clubbing, cyanosis or edema.   Neuro: Alert and oriented X 3. Psych:  Good affect, responds appropriately   LABS: Basic Metabolic Panel: Recent Labs    03/29/20 0426 03/30/20 0606  NA 147* 143  K 4.5 3.8  CL 110 105  CO2 30 28  GLUCOSE 290* 270*  BUN 55* 60*  CREATININE 1.03* 1.20*  CALCIUM 9.6 9.4  MG 2.3 2.1  PHOS 3.9 3.9   Liver Function Tests: Recent Labs    03/29/20 0426 03/30/20 0606  AST 19 32  ALT 35 49*  ALKPHOS 65 63  BILITOT 0.7 0.8  PROT 6.8 6.7  ALBUMIN 2.7* 2.7*   No results for input(s): LIPASE, AMYLASE in the last 72 hours. CBC: Recent Labs    03/29/20 0426 03/30/20 0456  WBC 12.8* 16.9*  NEUTROABS 11.7* 15.9*  HGB 13.2 12.1  HCT 44.2 38.3  MCV 98.4 95.8  PLT 170 185   Cardiac Enzymes: No results for input(s): CKTOTAL, CKMB, CKMBINDEX, TROPONINI in the last 72 hours. BNP: Invalid input(s): POCBNP D-Dimer: No results for input(s): DDIMER in the last 72 hours. Hemoglobin A1C: No results for input(s): HGBA1C in the last 72 hours. Fasting Lipid Panel: No results for input(s): CHOL, HDL,  LDLCALC, TRIG, CHOLHDL, LDLDIRECT in the last 72 hours. Thyroid Function Tests: Recent Labs    03/29/20 0426  TSH 1.319   Anemia Panel: No results for input(s): VITAMINB12, FOLATE, FERRITIN, TIBC, IRON, RETICCTPCT in the last 72 hours.  DG Chest Port 1 View  Result Date: 03/30/2020 CLINICAL DATA:  Acute respiratory failure EXAM: PORTABLE CHEST 1 VIEW COMPARISON:  Chest radiograph from the prior day. FINDINGS: An endotracheal tube terminates 1.5 cm from the carina. An enteric tube enters the stomach and terminates below the field of view. A left subclavian approach cardiac device is redemonstrated. A right upper extremity peripherally inserted central venous catheter tip overlies the superior vena cava. The heart size is normal. Vascular calcifications are seen in the aortic arch. A trace left pleural effusion with associated atelectasis is unchanged. Bilateral interstitial opacities have decreased. The right lung is clear. There is no pneumothorax. IMPRESSION: 1. Unchanged trace left pleural effusion with associated atelectasis. 2. Decreased bilateral interstitial opacities likely represent resolving pulmonary edema. Electronically Signed   By: Zerita Boers M.D.   On: 03/30/2020 11:57   DG Chest Port 1 View  Result Date: 03/29/2020 CLINICAL DATA:  64 year old female with history of  acute respiratory failure. EXAM: PORTABLE CHEST 1 VIEW COMPARISON:  Chest x-ray 03/25/2020. FINDINGS: An endotracheal tube is in place with tip 6.1 cm above the carina. There is a right upper extremity PICC with tip terminating in the mid superior vena cava. A nasogastric tube is seen extending into the stomach, however, the tip of the nasogastric tube extends below the lower margin of the image. No acute consolidative airspace disease. Trace left pleural effusion. No right pleural effusion. Cephalization of the pulmonary vasculature with some slight indistinctness of interstitial markings. Heart size appears borderline  enlarged. Upper mediastinal contours are within normal limits allowing for patient's rotation to the left. Aortic atherosclerosis. Left-sided pacemaker device in place with lead tips projecting over the expected location of the right atrium and right ventricle. IMPRESSION: 1. Support apparatus, as above. 2. Improving aeration which may reflect resolving multilobar pneumonia and/or resolving pulmonary edema. 3. Trace left pleural effusion. 4. Aortic atherosclerosis. Electronically Signed   By: Vinnie Langton M.D.   On: 03/29/2020 05:01     Echo  Overall ejection fractures around 50%  TELEMETRY:  Normal sinus rhythm at 70  ASSESSMENT AND PLAN:  Principal Problem:   CAP (community acquired pneumonia) Active Problems:   Essential hypertension, benign   Traumatic brain injury ([place])   Seizures ([place])   Bipolar affective disorder ([place])   Artificial cardiac pacemaker   COPD with acute exacerbation (H[facility])   AKI (acute kidney injury) ([place])   Acute respiratory failure (HCC)   Atrial fibrillation paroxysmal   plan  continue supportive respiratory care  main stain broad-spectrum antibiotic therapy for pneumonia  respiratory support for Chronic obstructive pulmonary disease  maintain adequate hydration for renal insufficiency  continue IV amiodarone for atrial fibrillation paroxysmal  continue hypertension management control  we are hopeful that the encephalopathy in resolves and the patient is able to wake up  continue conservative cardiac involvement at this point   Yolonda Kida, MD 03/30/2020 10:57 PM
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
## 2020-03-30 NOTE — Progress Notes (Signed)
Pt alert at times. Unable to follow commands. Abdominal muscle use with each breath, MD made aware. Non-purposeful movement in lower extremities. No movement noted in either upper extremity. Lifts head off bed at times. SBT this shift. Tube feeds running at goal. Foley intact, adequate output. ETT and OG intact. Will continue to monitor.

## 2020-03-31 DIAGNOSIS — R4182 Altered mental status, unspecified: Secondary | ICD-10-CM | POA: Diagnosis not present

## 2020-03-31 LAB — CBC WITH DIFFERENTIAL/PLATELET
Abs Immature Granulocytes: 0.1 10*3/uL — ABNORMAL HIGH (ref 0.00–0.07)
Basophils Absolute: 0 10*3/uL (ref 0.0–0.1)
Basophils Relative: 0 %
Eosinophils Absolute: 0 10*3/uL (ref 0.0–0.5)
Eosinophils Relative: 0 %
HCT: 36.6 % (ref 36.0–46.0)
Hemoglobin: 11.3 g/dL — ABNORMAL LOW (ref 12.0–15.0)
Immature Granulocytes: 1 %
Lymphocytes Relative: 7 %
Lymphs Abs: 1 10*3/uL (ref 0.7–4.0)
MCH: 30.1 pg (ref 26.0–34.0)
MCHC: 30.9 g/dL (ref 30.0–36.0)
MCV: 97.3 fL (ref 80.0–100.0)
Monocytes Absolute: 0.5 10*3/uL (ref 0.1–1.0)
Monocytes Relative: 3 %
Neutro Abs: 13.3 10*3/uL — ABNORMAL HIGH (ref 1.7–7.7)
Neutrophils Relative %: 89 %
Platelets: 176 10*3/uL (ref 150–400)
RBC: 3.76 MIL/uL — ABNORMAL LOW (ref 3.87–5.11)
RDW: 14.8 % (ref 11.5–15.5)
WBC: 14.9 10*3/uL — ABNORMAL HIGH (ref 4.0–10.5)
nRBC: 0 % (ref 0.0–0.2)

## 2020-03-31 LAB — GLUCOSE, CAPILLARY
Glucose-Capillary: 137 mg/dL — ABNORMAL HIGH (ref 70–99)
Glucose-Capillary: 165 mg/dL — ABNORMAL HIGH (ref 70–99)
Glucose-Capillary: 176 mg/dL — ABNORMAL HIGH (ref 70–99)
Glucose-Capillary: 120 mg/dL — ABNORMAL HIGH (ref 70–99)
Glucose-Capillary: 127 mg/dL — ABNORMAL HIGH (ref 70–99)
Glucose-Capillary: 108 mg/dL — ABNORMAL HIGH (ref 70–99)

## 2020-03-31 LAB — PHOSPHORUS: Phosphorus: 4.3 mg/dL (ref 2.5–4.6)

## 2020-03-31 LAB — MAGNESIUM: Magnesium: 2.2 mg/dL (ref 1.7–2.4)

## 2020-03-31 LAB — COMPREHENSIVE METABOLIC PANEL
ALT: 46 U/L — ABNORMAL HIGH (ref 0–44)
AST: 25 U/L (ref 15–41)
Albumin: 2.7 g/dL — ABNORMAL LOW (ref 3.5–5.0)
Alkaline Phosphatase: 57 U/L (ref 38–126)
Anion gap: 10 (ref 5–15)
BUN: 49 mg/dL — ABNORMAL HIGH (ref 8–23)
CO2: 30 mmol/L (ref 22–32)
Calcium: 9.1 mg/dL (ref 8.9–10.3)
Chloride: 101 mmol/L (ref 98–111)
Creatinine, Ser: 1.08 mg/dL — ABNORMAL HIGH (ref 0.44–1.00)
GFR calc Af Amer: >60 mL/min (ref >60)
GFR calc non Af Amer: 54 mL/min — ABNORMAL LOW (ref >60)
Glucose, Bld: 183 mg/dL — ABNORMAL HIGH (ref 70–99)
Potassium: 3.2 mmol/L — ABNORMAL LOW (ref 3.5–5.1)
Sodium: 141 mmol/L (ref 135–145)
Total Bilirubin: 0.8 mg/dL (ref 0.3–1.2)
Total Protein: 6.3 g/dL — ABNORMAL LOW (ref 6.5–8.1)

## 2020-03-31 LAB — T3, FREE: T3, Free: 1.3 pg/mL — ABNORMAL LOW (ref 2.0–4.4)

## 2020-03-31 MED ORDER — AMIODARONE HCL 200 MG PO TABS
400.0000 mg | ORAL_TABLET | Freq: Two times a day (BID) | ORAL | Status: DC
  Administered 2020-03-31 – 2020-04-01 (×3): 400 mg via ORAL
  Filled 2020-03-31 (×3): qty 2

## 2020-03-31 MED ORDER — POTASSIUM CHLORIDE 10 MEQ/50ML IV SOLN
10.0000 meq | INTRAVENOUS | Status: AC
  Administered 2020-03-31 (×6): 10 meq via INTRAVENOUS
  Filled 2020-03-31 (×6): qty 50

## 2020-03-31 MED ORDER — AMIODARONE HCL 200 MG PO TABS: 200.0000 mg | ORAL_TABLET | Freq: Two times a day (BID) | ORAL | Status: DC

## 2020-03-31 MED ORDER — INSULIN ASPART 100 UNIT/ML ~~LOC~~ SOLN
8.0000 [IU] | SUBCUTANEOUS | Status: DC
  Administered 2020-03-31 – 2020-04-01 (×4): 8 [IU] via SUBCUTANEOUS
  Filled 2020-03-31 (×4): qty 1

## 2020-03-31 MED ORDER — FENTANYL CITRATE (PF) 100 MCG/2ML IJ SOLN
25.0000 ug | Freq: Once | INTRAMUSCULAR | Status: AC
  Administered 2020-03-31: 25 ug via INTRAVENOUS
  Filled 2020-03-31: qty 2

## 2020-03-31 MED ORDER — FREE WATER
200.0000 mL | Status: DC
  Administered 2020-03-31 – 2020-04-01 (×3): 200 mL

## 2020-03-31 MED ORDER — AMIODARONE HCL 200 MG PO TABS: 200.0000 mg | ORAL_TABLET | Freq: Every day | ORAL | Status: DC

## 2020-03-31 NOTE — Progress Notes (Signed)
Subjective: Patient improved today but remains significantly altered  Objective: Current vital signs: BP (!) 147/52   Pulse 60   Temp 98.3 F (36.8 C)   Resp (!) 21   Ht 5' 5.98" (1.676 m)   Wt 64.6 kg   SpO2 94%   BMI 23.00 kg/m  Vital signs in last 24 hours: Temp:  [97.9 F (36.6 C)-99.2 F (37.3 C)] 98.3 F (36.8 C) (07/06 0700) Pulse Rate:  [58-74] 60 (07/06 1100) Resp:  [18-31] 21 (07/06 1100) BP: (79-180)/(43-71) 147/52 (07/06 1100) SpO2:  [84 %-95 %] 94 % (07/06 1130) FiO2 (%):  [35 %] 35 % (07/06 1130) Weight:  [64.6 kg] 64.6 kg (07/06 0500)  Intake/Output from previous day: 07/05 0701 - 07/06 0700 In: 2461.3 [I.V.:396.4; NG/GT:2064.8] Out: 1550 [Urine:1550] Intake/Output this shift: Total I/O In: -  Out: 500 [Urine:500] Nutritional status:  Diet Order            Diet NPO time specified  Diet effective now                 Neurologic Exam: Mental Status: Patient does not respond to verbal stimuli.  Opens eyes with deep sternal rub.  Does not follow commands.  No verbalizations noted.  Cranial Nerves: II: patient does not respond confrontation bilaterally, pupils reactive bilaterally  III,IV,VI: On first opening eyes they are midposition but quickly go to the right and down.   V,VII: corneal reflex present bilaterally  VIII: patient does not respond to verbal stimuli IX,X: gag reflex reduced, XI: trapezius strength unable to test bilaterally XII: tongue strength unable to test Motor/Sensory: Lifts lower extremities with noxious stimuli of the lower extremities.  No movement of the upper extremities noted   Lab Results: Basic Metabolic Panel: Recent Labs  Lab 03/27/20 0435 03/27/20 0435 03/28/20 0425 03/28/20 0425 03/29/20 0426 03/30/20 0606 03/31/20 0503  NA 150*  --  147*  --  147* 143 141  K 5.4*  --  4.8  --  4.5 3.8 3.2*  CL 117*  --  111  --  110 105 101  CO2 27  --  31  --  '30 28 30  ' GLUCOSE 265*  --  251*  --  290* 270* 183*  BUN  61*  --  62*  --  55* 60* 49*  CREATININE 1.31*  --  1.19*  --  1.03* 1.20* 1.08*  CALCIUM 10.0   < > 9.6   < > 9.6 9.4 9.1  MG 2.5*  --  2.4  --  2.3 2.1 2.2  PHOS 3.1  --  4.0  --  3.9 3.9 4.3   < > = values in this interval not displayed.    Liver Function Tests: Recent Labs  Lab 03/28/20 0425 03/29/20 0426 03/30/20 0606 03/31/20 0503  AST 11* 19 32 25  ALT 34 35 49* 46*  ALKPHOS 56 65 63 57  BILITOT 0.7 0.7 0.8 0.8  PROT 6.7 6.8 6.7 6.3*  ALBUMIN 2.4* 2.7* 2.7* 2.7*   No results for input(s): LIPASE, AMYLASE in the last 168 hours. Recent Labs  Lab 03/27/20 1057 03/28/20 0426 03/29/20 0430  AMMONIA 51* 29 36*    CBC: Recent Labs  Lab 03/27/20 0435 03/28/20 0425 03/29/20 0426 03/30/20 0456 03/31/20 0503  WBC 14.4* 12.4* 12.8* 16.9* 14.9*  NEUTROABS 13.0* 11.0* 11.7* 15.9* 13.3*  HGB 12.8 12.9 13.2 12.1 11.3*  HCT 43.6 43.2 44.2 38.3 36.6  MCV 101.2* 100.0 98.4 95.8 97.3  PLT 153 144* 170 185 176    Cardiac Enzymes: No results for input(s): CKTOTAL, CKMB, CKMBINDEX, TROPONINI in the last 168 hours.  Lipid Panel: No results for input(s): CHOL, TRIG, HDL, CHOLHDL, VLDL, LDLCALC in the last 168 hours.  CBG: Recent Labs  Lab 03/30/20 1923 03/30/20 2327 03/31/20 0351 03/31/20 0731 03/31/20 1113  GLUCAP 225* 156* 137* 165* 176*    Microbiology: Results for orders placed or performed during the hospital encounter of 03/20/20  SARS Coronavirus 2 by RT PCR (hospital order, performed in [facility] hospital lab) Nasopharyngeal Nasopharyngeal Swab     Status: None   Collection Time: 03/20/20  8:19 PM   Specimen: Nasopharyngeal Swab  Result Value Ref Range Status   SARS Coronavirus 2 NEGATIVE NEGATIVE Final    Comment: (NOTE) SARS-CoV-2 target nucleic acids are NOT DETECTED.  The SARS-CoV-2 RNA is generally detectable in upper and lower respiratory specimens during the acute phase of infection. The lowest concentration of SARS-CoV-2 viral copies this  assay can detect is 250 copies / mL. A negative result does not preclude SARS-CoV-2 infection and should not be used as the sole basis for treatment or other patient management decisions.  A negative result may occur with improper specimen collection / handling, submission of specimen other than nasopharyngeal swab, presence of viral mutation(s) within the areas targeted by this assay, and inadequate number of viral copies (<250 copies / mL). A negative result must be combined with clinical observations, patient history, and epidemiological information.  Fact Sheet for Patients:   StrictlyIdeas.no  Fact Sheet for Healthcare Providers: BankingDealers.co.za  This test is not yet approved or  cleared by the [place] FDA and has been authorized for detection and/or diagnosis of SARS-CoV-2 by FDA under an Emergency Use Authorization (EUA).  This EUA will remain in effect (meaning this test can be used) for the duration of the COVID-19 declaration under Section 564(b)(1) of the Act, 21 U.S.C. section 360bbb-3(b)(1), unless the authorization is terminated or revoked sooner.  Performed at [facility], [address] [place], [place] [address]   Blood Culture (routine x 2)     Status: None   Collection Time: 03/20/20  9:17 PM   Specimen: BLOOD  Result Value Ref Range Status   Specimen Description BLOOD LEFT FOREARM  Final   Special Requests   Final    BOTTLES DRAWN AEROBIC AND ANAEROBIC Blood Culture results may not be optimal due to an inadequate volume of blood received in culture bottles   Culture   Final    NO GROWTH 5 DAYS Performed at [facility], [address] [place], [place] [address]    Report Status 03/25/2020 FINAL  Final  Blood Culture (routine x 2)     Status: None   Collection Time: 03/20/20 10:11 PM   Specimen: BLOOD  Result Value Ref Range Status   Specimen Description BLOOD RIGHT FOREARM   Final   Special Requests   Final    BOTTLES DRAWN AEROBIC AND ANAEROBIC Blood Culture results may not be optimal due to an inadequate volume of blood received in culture bottles   Culture   Final    NO GROWTH 5 DAYS Performed at [facility], [address], [place], [place] [address]    Report Status 03/25/2020 FINAL  Final  MRSA PCR Screening     Status: None   Collection Time: 03/21/20  2:07 AM   Specimen: Nasal Mucosa; Nasopharyngeal  Result Value Ref Range Status  MRSA by PCR NEGATIVE NEGATIVE Final    Comment:        The GeneXpert MRSA Assay (FDA approved for NASAL specimens only), is one component of a comprehensive MRSA colonization surveillance program. It is not intended to diagnose MRSA infection nor to guide or monitor treatment for MRSA infections. Performed at [facility], [address] [place], [place] [address]   SARS Coronavirus 2 by RT PCR (hospital order, performed in [place] hospital lab)     Status: None   Collection Time: 03/23/20 10:12 AM  Result Value Ref Range Status   SARS Coronavirus 2 NEGATIVE NEGATIVE Final    Comment: (NOTE) SARS-CoV-2 target nucleic acids are NOT DETECTED.  The SARS-CoV-2 RNA is generally detectable in upper and lower respiratory specimens during the acute phase of infection. The lowest concentration of SARS-CoV-2 viral copies this assay can detect is 250 copies / mL. A negative result does not preclude SARS-CoV-2 infection and should not be used as the sole basis for treatment or other patient management decisions.  A negative result may occur with improper specimen collection / handling, submission of specimen other than nasopharyngeal swab, presence of viral mutation(s) within the areas targeted by this assay, and inadequate number of viral copies (<250 copies / mL). A negative result must be combined with clinical observations, patient history, and epidemiological information.  Fact Sheet for  Patients:   StrictlyIdeas.no  Fact Sheet for Healthcare Providers: BankingDealers.co.za  This test is not yet approved or  cleared by the [place] FDA and has been authorized for detection and/or diagnosis of SARS-CoV-2 by FDA under an Emergency Use Authorization (EUA).  This EUA will remain in effect (meaning this test can be used) for the duration of the COVID-19 declaration under Section 564(b)(1) of the Act, 21 U.S.C. section 360bbb-3(b)(1), unless the authorization is terminated or revoked sooner.  Performed at [facility], [address] [place], [place] [address]   Culture, respiratory (non-expectorated)     Status: None   Collection Time: 03/24/20  4:39 PM   Specimen: Tracheal Aspirate; Respiratory  Result Value Ref Range Status   Specimen Description   Final    TRACHEAL ASPIRATE Performed at [facility], [address], [place], [place] [address]    Special Requests   Final    NONE Performed at [facility], [address] [place], [place] [address]    Gram Stain   Final    RARE WBC PRESENT, PREDOMINANTLY PMN FEW SQUAMOUS EPITHELIAL CELLS PRESENT NO ORGANISMS SEEN    Culture   Final    NO GROWTH Performed at [facility], [place] [address], [place], [place] [address]    Report Status 03/25/2020 FINAL  Final  Expectorated sputum assessment w rflx to resp cult     Status: None   Collection Time: 03/25/20 11:21 AM   Specimen: Sputum  Result Value Ref Range Status   Specimen Description SPUTUM  Final   Special Requests Normal  Final   Sputum evaluation   Final    THIS SPECIMEN IS ACCEPTABLE FOR SPUTUM CULTURE Performed at [facility], [address], [place], [place] [address]    Report Status 03/25/2020 FINAL  Final  Culture, respiratory     Status: None   Collection Time: 03/25/20 11:21 AM   Specimen: SPU  Result Value Ref Range Status   Specimen Description    Final    SPUTUM Performed at [facility], [place]  [address], [place], [place] [address]    Special Requests   Final    Normal Reflexed from [phone] Performed at [facility], [place], [place] [address]    Gram Stain NO WBC SEEN NO ORGANISMS SEEN   Final   Culture   Final    NO GROWTH 2 DAYS Performed at [facility], [place] [address] [place], [place] [address]    Report Status 03/28/2020 FINAL  Final    Coagulation Studies: No results for input(s): LABPROT, INR in the last 72 hours.  Imaging: DG Chest Port 1 View  Result Date: 03/30/2020 CLINICAL DATA:  Acute respiratory failure EXAM: PORTABLE CHEST 1 VIEW COMPARISON:  Chest radiograph from the prior day. FINDINGS: An endotracheal tube terminates 1.5 cm from the carina. An enteric tube enters the stomach and terminates below the field of view. A left subclavian approach cardiac device is redemonstrated. A right upper extremity peripherally inserted central venous catheter tip overlies the superior vena cava. The heart size is normal. Vascular calcifications are seen in the aortic arch. A trace left pleural effusion with associated atelectasis is unchanged. Bilateral interstitial opacities have decreased. The right lung is clear. There is no pneumothorax. IMPRESSION: 1. Unchanged trace left pleural effusion with associated atelectasis. 2. Decreased bilateral interstitial opacities likely represent resolving pulmonary edema. Electronically Signed   By: Zerita Boers M.D.   On: 03/30/2020 11:57    Medications:  I have reviewed the patient's current medications. Scheduled: . amiodarone  400 mg Oral BID   Followed by  . [START ON 04/07/2020] amiodarone  200 mg Oral BID   Followed by  . [START ON 04/14/2020] amiodarone  200 mg Oral Daily  . amLODipine  5 mg Per Tube Daily  . budesonide (PULMICORT) nebulizer solution  0.5 mg Nebulization BID  . chlorhexidine gluconate (MEDLINE KIT)  15 mL Mouth Rinse  BID  . Chlorhexidine Gluconate Cloth  6 each Topical Daily  . clopidogrel  75 mg Per Tube Daily  . folic acid  1 mg Per Tube Daily  . free water  200 mL Per Tube Q2H  . heparin  5,000 Units Subcutaneous Q8H  . insulin aspart  0-20 Units Subcutaneous Q4H  . insulin aspart  8 Units Subcutaneous Q4H  . insulin glargine  10 Units Subcutaneous Daily  . ipratropium-albuterol  3 mL Nebulization Q4H  . lactulose  10 g Per Tube BID  . mouth rinse  15 mL Mouth Rinse 10 times per day  . multivitamin with minerals  1 tablet Per Tube Daily  . Oxcarbazepine  300 mg Per Tube QHS  . pantoprazole sodium  40 mg Per Tube Daily  . polyethylene glycol  17 g Per Tube Daily  . senna  1 tablet Per Tube QHS  . sodium chloride flush  10-40 mL Intracatheter Q12H  . thiamine  100 mg Oral Daily   Or  . thiamine  100 mg Intravenous Daily    Assessment/Plan: 64 y.o.femalewith PMH of COPD, Seizure disorder, second-degree heart block s/p PPM placement, PVD s/p multiple stent placements (11/2019), h/o CVA, Emphysema, HTN, HLD,TBI with residual memory deficit, seizures, chronic hepatitis C, bipolar disorder, h/o substance abuse, PTSD and tobacco use whowas admitted with acute hypoxic respiratory failure secondary to CAP and COPD with acute exacerbation.Pt admitted 6/25 and intubated 6/29. Has been slow to recover. Head CT performed and shows no acute changes. Patient had eye deviation prompting EEG which was only significant for slowing with  triphasic waves consistent with a metabolic encephalopathy. Patient's medications are being reduced to only those which are necessary while treatment of infection continues. Today patient somewhat improved on evaluation although clearly not back to baseline.  Some concern about continued use of Neurontin with AKI therefore this has been discontinued.    Recommendations: 1. Continue Trileptal  2. MRI of the brain without contrast    LOS: 11 days   Alexis Goodell,  MD Neurology [phone] 03/31/2020  12:30 PM

## 2020-03-31 NOTE — Progress Notes (Signed)
[place] enroute to RN station when pt.'s husband motioned for [facility] to come into pt.'s rm. immediately.  Pt. lying down in bed, intubated but looking around.  Husband wanted [place] to see pt. seems to be more alert today.  Husband said he has been spending hours praying over pt. in rm. and he is very encouraged by her apparent improvement which he counts an answer to prayer.  No further needs at this time; [facility] will monitor pt./family needs.    03/31/20 1430  Clinical Encounter Type  Visited With Patient;Patient and family together  Visit Type Follow-up
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
## 2020-03-31 NOTE — Progress Notes (Signed)
Nutrition Follow-up  DOCUMENTATION CODES:   Not applicable  INTERVENTION:  If patient does not extubate recommend advancing back to goal TF regimen of Vital 1.5 Cal at 55 mL/hr (1320 mL goal daily volume). Provides 1584 kcal, 99 grams of protein, 1069 mL H2O daily.  NUTRITION DIAGNOSIS:   Inadequate oral intake related to inability to eat as evidenced by NPO status.  Ongoing.  GOAL:   Patient will meet greater than or equal to 90% of their needs  Not met at this time.  MONITOR:   Vent status, Labs, Weight trends, TF tolerance, I & O's  REASON FOR ASSESSMENT:   Ventilator, Consult Assessment of nutrition requirement/status, Enteral/tube feeding initiation and management  ASSESSMENT:   64 year old female with PMHx of bipolar disorder, TBI, HTN, CAD, PVD, COPD, seizure disorder admitted with acute exacerbation of COPD, multifocal PNA, A-fib with RVR.   Patient is currently intubated on ventilator support MV: 10.7 L/min Temp (24hrs), Avg:98.6 F (37 C), Min:97.9 F (36.6 C), Max:99.2 F (37.3 C)  Medications reviewed and include: folic acid 1 mg daily per tube, free water 200 mL Q2hrs, Novolog 0-20 units Q4hrs, Novolog 8 units Q4hrs, Lantus 10 units daily, Lactulose 10 grams BID, MVI daily, Protonix, Miralax, senna 1 tablet daily, thiamine 100 mg daily, potassium chloride 10 mEq IV x 6 today.  Labs reviewed: CBG 137-176, Potassium 3.2, BUN 49, Creatinine 1.08.  Enteral Access: OGT; per chest x-ray 7/5 tube enters the stomach but terminates below the field of view  I/O: 1550 mL UOP yesterday (1 mL/kg/hr)  Weight trend: 64.6 kg on 7/6; -0.7 kg from 6/26  Per chart patient's tube feeds were reduced to 20 mL/hr yesterday evening. Per RN no issues with intolerance of tube feeds were reported. It was reported that tube feeds were decreased due to concern for respiratory status.  Discussed on rounds. Plan for SBT today.  Diet Order:   Diet Order            Diet NPO time  specified  Diet effective now                EDUCATION NEEDS:   No education needs have been identified at this time  Skin:  Skin Assessment: Reviewed RN Assessment  Last BM:  03/24/2020 - type 6  Height:   Ht Readings from Last 1 Encounters:  03/30/20 5' 5.98" (1.676 m)   Weight:   Wt Readings from Last 1 Encounters:  03/31/20 64.6 kg   BMI:  Body mass index is 23 kg/m.  Estimated Nutritional Needs:   Kcal:  1600-1800  Protein:  95-105 grams  Fluid:  >/= 1.9 L/day  Jacklynn Barnacle, MS, RD, LDN Pager number available on Amion

## 2020-03-31 NOTE — Consult Note (Signed)
[facility] for Electrolyte Monitoring and Replacement   Recent Labs: Potassium (mmol/L)  Date Value  03/31/2020 3.2 (L)   Magnesium (mg/dL)  Date Value  03/31/2020 2.2   Calcium (mg/dL)  Date Value  03/31/2020 9.1   Albumin (g/dL)  Date Value  03/31/2020 2.7 (L)  06/25/2019 4.5   Phosphorus (mg/dL)  Date Value  03/31/2020 4.3   Sodium (mmol/L)  Date Value  03/31/2020 141  09/17/2015 136    Assessment: 64 y.o.femalewith PMH of COPD, Seizure disorder, second-degree heart block s/p PPM placement, PVD s/p multiple stent placements (11/2019), h/o CVA, Emphysema, HTN, HLD,TBI with residual memory deficit, seizures, chronic hepatitis C, bipolar disorder, h/o substance abuse, PTSD and tobacco use whowas admitted with acute hypoxic respiratory failure secondary to CAP and COPD with acute exacerbation. Pharmacy has been asked to evaluate daily for electrolye replacement.  Goal of Therapy:  Potassium 4.0 - 5.1 mmol/L Magnesium 2.0 - 2.4 mg/dL All Other Electrolytes WNL  Plan:   10 mEq IV KCl x 6 per Delight Ovens, NP  Re-check electrolytes with am lab 7/6  Dallie Piles ,PharmD Clinical Pharmacist 03/31/2020 7:15 AM

## 2020-04-01 DIAGNOSIS — R4182 Altered mental status, unspecified: Secondary | ICD-10-CM | POA: Diagnosis not present

## 2020-04-01 LAB — GLUCOSE, CAPILLARY
Glucose-Capillary: 112 mg/dL — ABNORMAL HIGH (ref 70–99)
Glucose-Capillary: 123 mg/dL — ABNORMAL HIGH (ref 70–99)
Glucose-Capillary: 129 mg/dL — ABNORMAL HIGH (ref 70–99)
Glucose-Capillary: 132 mg/dL — ABNORMAL HIGH (ref 70–99)
Glucose-Capillary: 192 mg/dL — ABNORMAL HIGH (ref 70–99)
Glucose-Capillary: 89 mg/dL (ref 70–99)

## 2020-04-01 LAB — BLOOD GAS, ARTERIAL
Acid-Base Excess: 7.1 mmol/L — ABNORMAL HIGH (ref 0.0–2.0)
Acid-base deficit: 5.1 mmol/L — ABNORMAL HIGH (ref 0.0–2.0)
Bicarbonate: 23.1 mmol/L (ref 20.0–28.0)
Bicarbonate: 32 mmol/L — ABNORMAL HIGH (ref 20.0–28.0)
Delivery systems: POSITIVE
Expiratory PAP: 6
FIO2: 0.5
FIO2: 0.5
Inspiratory PAP: 12
MECHVT: 450 mL
Mechanical Rate: 10
O2 Saturation: 91.7 %
O2 Saturation: 98.4 %
PEEP: 5 cmH2O
Patient temperature: 37
Patient temperature: 37
RATE: 15 resp/min
pCO2 arterial: 54 mmHg — ABNORMAL HIGH (ref 32.0–48.0)
pCO2 arterial: 46 mmHg (ref 32.0–48.0)
pH, Arterial: 7.24 — ABNORMAL LOW (ref 7.350–7.450)
pH, Arterial: 7.45 (ref 7.350–7.450)
pO2, Arterial: 74 mmHg — ABNORMAL LOW (ref 83.0–108.0)
pO2, Arterial: 107 mmHg (ref 83.0–108.0)

## 2020-04-01 LAB — COMPREHENSIVE METABOLIC PANEL
ALT: 35 U/L (ref 0–44)
AST: 19 U/L (ref 15–41)
Albumin: 2.5 g/dL — ABNORMAL LOW (ref 3.5–5.0)
Alkaline Phosphatase: 50 U/L (ref 38–126)
Anion gap: 5 (ref 5–15)
BUN: 41 mg/dL — ABNORMAL HIGH (ref 8–23)
CO2: 33 mmol/L — ABNORMAL HIGH (ref 22–32)
Calcium: 8.7 mg/dL — ABNORMAL LOW (ref 8.9–10.3)
Chloride: 106 mmol/L (ref 98–111)
Creatinine, Ser: 0.9 mg/dL (ref 0.44–1.00)
GFR calc Af Amer: >60 mL/min (ref >60)
GFR calc non Af Amer: >60 mL/min (ref >60)
Glucose, Bld: 124 mg/dL — ABNORMAL HIGH (ref 70–99)
Potassium: 3.7 mmol/L (ref 3.5–5.1)
Sodium: 144 mmol/L (ref 135–145)
Total Bilirubin: 0.7 mg/dL (ref 0.3–1.2)
Total Protein: 5.7 g/dL — ABNORMAL LOW (ref 6.5–8.1)

## 2020-04-01 LAB — CBC WITH DIFFERENTIAL/PLATELET
Abs Immature Granulocytes: 0.06 10*3/uL (ref 0.00–0.07)
Basophils Absolute: 0 10*3/uL (ref 0.0–0.1)
Basophils Relative: 0 %
Eosinophils Absolute: 0 10*3/uL (ref 0.0–0.5)
Eosinophils Relative: 0 %
HCT: 32.2 % — ABNORMAL LOW (ref 36.0–46.0)
Hemoglobin: 10 g/dL — ABNORMAL LOW (ref 12.0–15.0)
Immature Granulocytes: 1 %
Lymphocytes Relative: 9 %
Lymphs Abs: 0.9 10*3/uL (ref 0.7–4.0)
MCH: 30.6 pg (ref 26.0–34.0)
MCHC: 31.1 g/dL (ref 30.0–36.0)
MCV: 98.5 fL (ref 80.0–100.0)
Monocytes Absolute: 0.4 10*3/uL (ref 0.1–1.0)
Monocytes Relative: 4 %
Neutro Abs: 8.6 10*3/uL — ABNORMAL HIGH (ref 1.7–7.7)
Neutrophils Relative %: 86 %
Platelets: 153 10*3/uL (ref 150–400)
RBC: 3.27 MIL/uL — ABNORMAL LOW (ref 3.87–5.11)
RDW: 14.6 % (ref 11.5–15.5)
WBC: 10 10*3/uL (ref 4.0–10.5)
nRBC: 0 % (ref 0.0–0.2)

## 2020-04-01 LAB — MISC LABCORP TEST (SEND OUT): Labcorp test code: 716928

## 2020-04-01 LAB — PHOSPHORUS: Phosphorus: 3.8 mg/dL (ref 2.5–4.6)

## 2020-04-01 LAB — MAGNESIUM: Magnesium: 2.2 mg/dL (ref 1.7–2.4)

## 2020-04-01 MED ORDER — POTASSIUM CHLORIDE 20 MEQ/15ML (10%) PO SOLN
40.0000 meq | Freq: Once | ORAL | Status: AC
  Administered 2020-04-01: 40 meq via ORAL
  Filled 2020-04-01: qty 30

## 2020-04-01 MED ORDER — AMIODARONE HCL IN DEXTROSE 360-4.14 MG/200ML-% IV SOLN
30.0000 mg/h | INTRAVENOUS | Status: DC
  Administered 2020-04-02 – 2020-04-03 (×3): 30 mg/h via INTRAVENOUS
  Filled 2020-04-01 (×3): qty 200

## 2020-04-01 MED ORDER — CHLORHEXIDINE GLUCONATE CLOTH 2 % EX PADS
6.0000 | MEDICATED_PAD | Freq: Every day | CUTANEOUS | Status: DC
  Administered 2020-04-01 – 2020-04-07 (×6): 6 via TOPICAL

## 2020-04-01 NOTE — Progress Notes (Signed)
[facility] Cardiology    SUBJECTIVE: Finally extubated mental status improving sitting up in a chair states she feels better still weak.   Vitals:   04/01/20 0900 04/01/20 1000 04/01/20 1100 04/01/20 1153  BP: (!) 136/47 (!) 156/53 (!) 156/48   Pulse: 62 62 63 (!) 55  Resp: (!) 26 (!) 28 (!) 23 (!) 24  Temp:    97.7 F (36.5 C)  TempSrc:    Axillary  SpO2: 94% 93% 91% 100%  Weight:      Height:         Intake/Output Summary (Last 24 hours) at 04/01/2020 1156 Last data filed at 04/01/2020 1042 Gross per 24 hour  Intake 1741.43 ml  Output 1995 ml  Net -253.57 ml      PHYSICAL EXAM  General: Well developed, well nourished, in no acute distress HEENT:  Normocephalic and atramatic Neck:  No JVD.  Lungs: Clear bilaterally to auscultation and percussion. Heart: HRRR . Normal S1 and S2 without gallops or murmurs.  Abdomen: Bowel sounds are positive, abdomen soft and non-tender  Msk:  Back normal, normal gait. Normal strength and tone for age. Extremities: No clubbing, cyanosis or edema.   Neuro: Alert and oriented X 3. Psych:  Good affect, responds appropriately   LABS: Basic Metabolic Panel: Recent Labs    03/31/20 0503 04/01/20 0414  NA 141 144  K 3.2* 3.7  CL 101 106  CO2 30 33*  GLUCOSE 183* 124*  BUN 49* 41*  CREATININE 1.08* 0.90  CALCIUM 9.1 8.7*  MG 2.2 2.2  PHOS 4.3 3.8   Liver Function Tests: Recent Labs    03/31/20 0503 04/01/20 0414  AST 25 19  ALT 46* 35  ALKPHOS 57 50  BILITOT 0.8 0.7  PROT 6.3* 5.7*  ALBUMIN 2.7* 2.5*   No results for input(s): LIPASE, AMYLASE in the last 72 hours. CBC: Recent Labs    03/31/20 0503 04/01/20 0414  WBC 14.9* 10.0  NEUTROABS 13.3* 8.6*  HGB 11.3* 10.0*  HCT 36.6 32.2*  MCV 97.3 98.5  PLT 176 153   Cardiac Enzymes: No results for input(s): CKTOTAL, CKMB, CKMBINDEX, TROPONINI in the last 72 hours. BNP: Invalid input(s): POCBNP D-Dimer: No results for input(s): DDIMER in the last 72 hours. Hemoglobin  A1C: No results for input(s): HGBA1C in the last 72 hours. Fasting Lipid Panel: No results for input(s): CHOL, HDL, LDLCALC, TRIG, CHOLHDL, LDLDIRECT in the last 72 hours. Thyroid Function Tests: Recent Labs    03/29/20 1933  T3FREE 1.3*   Anemia Panel: No results for input(s): VITAMINB12, FOLATE, FERRITIN, TIBC, IRON, RETICCTPCT in the last 72 hours.  No results found.   Echo EF around 45 to 50%  TELEMETRY: Normal sinus rhythm rate of 160s nonspecific T changes:  ASSESSMENT AND PLAN:  Principal Problem:   CAP (community acquired pneumonia) Active Problems:   Essential hypertension, benign   Traumatic brain injury (HCC)   Seizures (HCC)   Bipolar affective disorder ([place])   Artificial cardiac pacemaker   COPD with acute exacerbation ([place])   AKI (acute kidney injury) ([place])   Acute respiratory failure ([place])    Plan Respiratory failure status post extubation continue supportive care Continue antibiotic therapy for pneumonia Agree with hypertension management and control Inhalers as necessary for COPD Continue adequate hydration for renal insufficiency Atrial fibrillation is converted to sinus rhythm recommend short-term anticoagulation for now Permanent pacemaker in place no evidence of any need for adjustment at this stage We will continue conservative cardiac  involvement at this point   Yolonda Kida, MD 04/01/2020 11:56 AM

## 2020-04-01 NOTE — Progress Notes (Signed)
[facility] Cardiology    SUBJECTIVE: Still intubated slightly more alert still may be disoriented but encephalopathy appears to be resolving   Vitals:   04/01/20 0500 04/01/20 0600 04/01/20 0700 04/01/20 0800  BP: (!) 160/47 (!) 145/49 (!) 142/43 (!) 159/58  Pulse: 61 62 60 67  Resp: (!) 24 (!) 26 (!) 24 (!) 21  Temp:    99 F (37.2 C)  TempSrc:    Oral  SpO2: 96% 94% 95% 96%  Weight:      Height:         Intake/Output Summary (Last 24 hours) at 04/01/2020 0835 Last data filed at 04/01/2020 0600 Gross per 24 hour  Intake 2526.44 ml  Output 1895 ml  Net 631.44 ml      PHYSICAL EXAM  General: Well developed, well nourished, in no acute distress slightly more alert still disoriented on the vent HEENT:  Normocephalic and atramatic Neck:  No JVD.  Lungs: Clear bilaterally to auscultation and percussion. Heart: HRRR . Normal S1 and S2 without gallops or murmurs.  Abdomen: Bowel sounds are positive, abdomen soft and non-tender  Msk:  Back normal, normal gait. Normal strength and tone for age. Extremities: No clubbing, cyanosis or edema.   Neuro: Alert and oriented X 3. Psych:  Good affect, responds appropriately   LABS: Basic Metabolic Panel: Recent Labs    03/31/20 0503 04/01/20 0414  NA 141 144  K 3.2* 3.7  CL 101 106  CO2 30 33*  GLUCOSE 183* 124*  BUN 49* 41*  CREATININE 1.08* 0.90  CALCIUM 9.1 8.7*  MG 2.2 2.2  PHOS 4.3 3.8   Liver Function Tests: Recent Labs    03/31/20 0503 04/01/20 0414  AST 25 19  ALT 46* 35  ALKPHOS 57 50  BILITOT 0.8 0.7  PROT 6.3* 5.7*  ALBUMIN 2.7* 2.5*   No results for input(s): LIPASE, AMYLASE in the last 72 hours. CBC: Recent Labs    03/31/20 0503 04/01/20 0414  WBC 14.9* 10.0  NEUTROABS 13.3* 8.6*  HGB 11.3* 10.0*  HCT 36.6 32.2*  MCV 97.3 98.5  PLT 176 153   Cardiac Enzymes: No results for input(s): CKTOTAL, CKMB, CKMBINDEX, TROPONINI in the last 72 hours. BNP: Invalid input(s): POCBNP D-Dimer: No results for  input(s): DDIMER in the last 72 hours. Hemoglobin A1C: No results for input(s): HGBA1C in the last 72 hours. Fasting Lipid Panel: No results for input(s): CHOL, HDL, LDLCALC, TRIG, CHOLHDL, LDLDIRECT in the last 72 hours. Thyroid Function Tests: Recent Labs    03/29/20 1933  T3FREE 1.3*   Anemia Panel: No results for input(s): VITAMINB12, FOLATE, FERRITIN, TIBC, IRON, RETICCTPCT in the last 72 hours.  No results found.   Echo borderline overall left ventricular function between 50 and 55%  TELEMETRY: Normal sinus rhythm rate of around 65  ASSESSMENT AND PLAN:  Principal Problem:   CAP (community acquired pneumonia) Active Problems:   Essential hypertension, benign   Traumatic brain injury ([place])   Seizures ([place])   Bipolar affective disorder ([place])   Artificial cardiac pacemaker   COPD with acute exacerbation ([place])   AKI (acute kidney injury) ([place])   Acute respiratory failure ([place])    Plan Patient awake much more alert encephalopathy at the seem to be improving Continue supportive care hopefully will be able to wean off the vent Continue anticoagulation for atrial fibrillation Maintain rate control for atrial fibrillation Agree with antibiotic therapy for pneumonia Continue conservative supportive cardiac input at this point   Jaciel Diem  [place], MD 04/01/2020 8:35 AM
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
## 2020-04-01 NOTE — Progress Notes (Signed)
Extubation order written.  Cuff leak noted.  Patient extubated and placed on HFNC 6lpm and within a few seconds patient became apneic.  Using ambu bag with 100% oxygen patient was bagged for approx 10 breaths and resumed breathing and responsive to stimuli and verbal request.  Patient placed on BIPAP and is tolerating well. HR 60  RR 20  Oxygen saturations 100%.  RT and RN present during this entire time and MD came to the room after patient was placed on BIPAP to assess patient status.  Will continue to monitor patient status on BIPAP.

## 2020-04-01 NOTE — Progress Notes (Signed)
Patient was extubated today around 1130.  BiPAP therapy utilized with saturation readings in the high 90's.  Patient alert and answering questions with shake or nod of the head.  Husband came to bedside to visit today.  Preacher came to visit and pray with family today.  Patient foley producing good urine, yellow, clear.  Patient stool by rectal pouch is 14ml for shift and the rectal bag was changed.  Patient vitals remain WDL with HR atrial paced in the 50's.  Swallow study will be performed tomorrow.  PT will be performed tomorrow.  No events to note.

## 2020-04-01 NOTE — Progress Notes (Signed)
Subjective: Patient improved today.  More alert.  Unable to obtain MRI due to pacer.    Objective: Current vital signs: BP (!) 136/47   Pulse 62   Temp 99 F (37.2 C) (Oral)   Resp (!) 26   Ht 5' 5.98" (1.676 m)   Wt 61.7 kg   SpO2 94%   BMI 21.97 kg/m  Vital signs in last 24 hours: Temp:  [98.2 F (36.8 C)-99 F (37.2 C)] 99 F (37.2 C) (07/07 0800) Pulse Rate:  [55-78] 62 (07/07 0900) Resp:  [18-30] 26 (07/07 0900) BP: (101-171)/(40-60) 136/47 (07/07 0900) SpO2:  [82 %-97 %] 94 % (07/07 0900) FiO2 (%):  [35 %] 35 % (07/07 0800) Weight:  [61.7 kg] 61.7 kg (07/07 0343)  Intake/Output from previous day: 07/06 0701 - 07/07 0700 In: 2543 [I.V.:117.9; NT/IR:4431; IV Piggyback:300.2] Out: 1895 [Urine:1320; Stool:575] Intake/Output this shift: No intake/output data recorded. Nutritional status:  Diet Order            Diet NPO time specified  Diet effective now                 Neurologic Exam: Mental Status: Eyes open.  Attends to visitors in the room and examiner.  Intubated.   Cranial Nerves: II: Responds to  confrontation bilaterally III,IV,VI: EOMI V,VII: corneal reflex present bilaterally  VIII: patient does not respond to verbal stimuli IX,X: gag reflex not tested, XI: trapezius strength unable to test bilaterally XII: tongue strength unable to test Motor: No movement noted in the extremities   Lab Results: Basic Metabolic Panel: Recent Labs  Lab 03/28/20 0425 03/28/20 0425 03/29/20 0426 03/29/20 0426 03/30/20 0606 03/31/20 0503 04/01/20 0414  NA 147*  --  147*  --  143 141 144  K 4.8  --  4.5  --  3.8 3.2* 3.7  CL 111  --  110  --  105 101 106  CO2 31  --  30  --  28 30 33*  GLUCOSE 251*  --  290*  --  270* 183* 124*  BUN 62*  --  55*  --  60* 49* 41*  CREATININE 1.19*  --  1.03*  --  1.20* 1.08* 0.90  CALCIUM 9.6   < > 9.6   < > 9.4 9.1 8.7*  MG 2.4  --  2.3  --  2.1 2.2 2.2  PHOS 4.0  --  3.9  --  3.9 4.3 3.8   < > = values in this  interval not displayed.    Liver Function Tests: Recent Labs  Lab 03/28/20 0425 03/29/20 0426 03/30/20 0606 03/31/20 0503 04/01/20 0414  AST 11* 19 32 25 19  ALT 34 35 49* 46* 35  ALKPHOS 56 65 63 57 50  BILITOT 0.7 0.7 0.8 0.8 0.7  PROT 6.7 6.8 6.7 6.3* 5.7*  ALBUMIN 2.4* 2.7* 2.7* 2.7* 2.5*   No results for input(s): LIPASE, AMYLASE in the last 168 hours. Recent Labs  Lab 03/27/20 1057 03/28/20 0426 03/29/20 0430  AMMONIA 51* 29 36*    CBC: Recent Labs  Lab 03/28/20 0425 03/29/20 0426 03/30/20 0456 03/31/20 0503 04/01/20 0414  WBC 12.4* 12.8* 16.9* 14.9* 10.0  NEUTROABS 11.0* 11.7* 15.9* 13.3* 8.6*  HGB 12.9 13.2 12.1 11.3* 10.0*  HCT 43.2 44.2 38.3 36.6 32.2*  MCV 100.0 98.4 95.8 97.3 98.5  PLT 144* 170 185 176 153    Cardiac Enzymes: No results for input(s): CKTOTAL, CKMB, CKMBINDEX, TROPONINI in the last 168  hours.  Lipid Panel: No results for input(s): CHOL, TRIG, HDL, CHOLHDL, VLDL, LDLCALC in the last 168 hours.  CBG: Recent Labs  Lab 03/31/20 1506 03/31/20 1951 03/31/20 2317 04/01/20 0333 04/01/20 0748  GLUCAP 120* 127* 108* 123* 41    Microbiology: Results for orders placed or performed during the hospital encounter of 03/20/20  SARS Coronavirus 2 by RT PCR (hospital order, performed in [facility] hospital lab) Nasopharyngeal Nasopharyngeal Swab     Status: None   Collection Time: 03/20/20  8:19 PM   Specimen: Nasopharyngeal Swab  Result Value Ref Range Status   SARS Coronavirus 2 NEGATIVE NEGATIVE Final    Comment: (NOTE) SARS-CoV-2 target nucleic acids are NOT DETECTED.  The SARS-CoV-2 RNA is generally detectable in upper and lower respiratory specimens during the acute phase of infection. The lowest concentration of SARS-CoV-2 viral copies this assay can detect is 250 copies / mL. A negative result does not preclude SARS-CoV-2 infection and should not be used as the sole basis for treatment or other patient management decisions.   A negative result may occur with improper specimen collection / handling, submission of specimen other than nasopharyngeal swab, presence of viral mutation(s) within the areas targeted by this assay, and inadequate number of viral copies (<250 copies / mL). A negative result must be combined with clinical observations, patient history, and epidemiological information.  Fact Sheet for Patients:   StrictlyIdeas.no  Fact Sheet for Healthcare Providers: BankingDealers.co.za  This test is not yet approved or  cleared by the [place] FDA and has been authorized for detection and/or diagnosis of SARS-CoV-2 by FDA under an Emergency Use Authorization (EUA).  This EUA will remain in effect (meaning this test can be used) for the duration of the COVID-19 declaration under Section 564(b)(1) of the Act, 21 U.S.C. section 360bbb-3(b)(1), unless the authorization is terminated or revoked sooner.  Performed at [facility], [address] [place], [place] [address]   Blood Culture (routine x 2)     Status: None   Collection Time: 03/20/20  9:17 PM   Specimen: BLOOD  Result Value Ref Range Status   Specimen Description BLOOD LEFT FOREARM  Final   Special Requests   Final    BOTTLES DRAWN AEROBIC AND ANAEROBIC Blood Culture results may not be optimal due to an inadequate volume of blood received in culture bottles   Culture   Final    NO GROWTH 5 DAYS Performed at [facility], [address] [place], [place] [address]    Report Status 03/25/2020 FINAL  Final  Blood Culture (routine x 2)     Status: None   Collection Time: 03/20/20 10:11 PM   Specimen: BLOOD  Result Value Ref Range Status   Specimen Description BLOOD RIGHT FOREARM  Final   Special Requests   Final    BOTTLES DRAWN AEROBIC AND ANAEROBIC Blood Culture results may not be optimal due to an inadequate volume of blood received in culture bottles   Culture    Final    NO GROWTH 5 DAYS Performed at [facility], [address], [place], [place] [address]    Report Status 03/25/2020 FINAL  Final  MRSA PCR Screening     Status: None   Collection Time: 03/21/20  2:07 AM   Specimen: Nasal Mucosa; Nasopharyngeal  Result Value Ref Range Status   MRSA by PCR NEGATIVE NEGATIVE Final    Comment:        The GeneXpert MRSA Assay (  FDA approved for NASAL specimens only), is one component of a comprehensive MRSA colonization surveillance program. It is not intended to diagnose MRSA infection nor to guide or monitor treatment for MRSA infections. Performed at [facility], [address] [place], [place] [address]   SARS Coronavirus 2 by RT PCR (hospital order, performed in [facility] hospital lab)     Status: None   Collection Time: 03/23/20 10:12 AM  Result Value Ref Range Status   SARS Coronavirus 2 NEGATIVE NEGATIVE Final    Comment: (NOTE) SARS-CoV-2 target nucleic acids are NOT DETECTED.  The SARS-CoV-2 RNA is generally detectable in upper and lower respiratory specimens during the acute phase of infection. The lowest concentration of SARS-CoV-2 viral copies this assay can detect is 250 copies / mL. A negative result does not preclude SARS-CoV-2 infection and should not be used as the sole basis for treatment or other patient management decisions.  A negative result may occur with improper specimen collection / handling, submission of specimen other than nasopharyngeal swab, presence of viral mutation(s) within the areas targeted by this assay, and inadequate number of viral copies (<250 copies / mL). A negative result must be combined with clinical observations, patient history, and epidemiological information.  Fact Sheet for Patients:   StrictlyIdeas.no  Fact Sheet for Healthcare Providers: BankingDealers.co.za  This test is not yet approved or  cleared by the  [place] FDA and has been authorized for detection and/or diagnosis of SARS-CoV-2 by FDA under an Emergency Use Authorization (EUA).  This EUA will remain in effect (meaning this test can be used) for the duration of the COVID-19 declaration under Section 564(b)(1) of the Act, 21 U.S.C. section 360bbb-3(b)(1), unless the authorization is terminated or revoked sooner.  Performed at [facility], [address] [place], [place] [address]   Culture, respiratory (non-expectorated)     Status: None   Collection Time: 03/24/20  4:39 PM   Specimen: Tracheal Aspirate; Respiratory  Result Value Ref Range Status   Specimen Description   Final    TRACHEAL ASPIRATE Performed at [facility], [address] [place], [place] [address]    Special Requests   Final    NONE Performed at [facility], [address] [place], [place] [address]    Gram Stain   Final    RARE WBC PRESENT, PREDOMINANTLY PMN FEW SQUAMOUS EPITHELIAL CELLS PRESENT NO ORGANISMS SEEN    Culture   Final    NO GROWTH Performed at [facility], [place] [address], [place], [place] [address]    Report Status 03/25/2020 FINAL  Final  Expectorated sputum assessment w rflx to resp cult     Status: None   Collection Time: 03/25/20 11:21 AM   Specimen: Sputum  Result Value Ref Range Status   Specimen Description SPUTUM  Final   Special Requests Normal  Final   Sputum evaluation   Final    THIS SPECIMEN IS ACCEPTABLE FOR SPUTUM CULTURE Performed at [facility], [address] [place], [place] [address]    Report Status 03/25/2020 FINAL  Final  Culture, respiratory     Status: None   Collection Time: 03/25/20 11:21 AM   Specimen: SPU  Result Value Ref Range Status   Specimen Description   Final    SPUTUM Performed at [facility], [address], [place], [place] [address]    Special Requests   Final    Normal Reflexed from [phone] Performed at  [place], [place] [address]    Gram Stain NO WBC SEEN NO ORGANISMS SEEN   Final   Culture   Final    NO GROWTH 2 DAYS Performed at [facility], [place] [address], [place], [place] [address]    Report Status 03/28/2020 FINAL  Final    Coagulation Studies: No results for input(s): LABPROT, INR in the last 72 hours.  Imaging: No results found.  Medications:  I have reviewed the patient's current medications. Scheduled: . amiodarone  400 mg Oral BID   Followed by  . [START ON 04/07/2020] amiodarone  200 mg Oral BID   Followed by  . [START ON 04/14/2020] amiodarone  200 mg Oral Daily  . amLODipine  5 mg Per Tube Daily  . budesonide (PULMICORT) nebulizer solution  0.5 mg Nebulization BID  . chlorhexidine gluconate (MEDLINE KIT)  15 mL Mouth Rinse BID  . Chlorhexidine Gluconate Cloth  6 each Topical Daily  . clopidogrel  75 mg Per Tube Daily  . folic acid  1 mg Per Tube Daily  . free water  200 mL Per Tube Q4H  . heparin  5,000 Units Subcutaneous Q8H  . insulin aspart  0-20 Units Subcutaneous Q4H  . insulin glargine  10 Units Subcutaneous Daily  . ipratropium-albuterol  3 mL Nebulization Q4H  . lactulose  10 g Per Tube BID  . mouth rinse  15 mL Mouth Rinse 10 times per day  . multivitamin with minerals  1 tablet Per Tube Daily  . Oxcarbazepine  300 mg Per Tube QHS  . pantoprazole sodium  40 mg Per Tube Daily  . polyethylene glycol  17 g Per Tube Daily  . senna  1 tablet Per Tube QHS  . sodium chloride flush  10-40 mL Intracatheter Q12H  . thiamine  100 mg Oral Daily   Or  . thiamine  100 mg Intravenous Daily    Assessment/Plan: 64 y.o.femalewith PMH of COPD, Seizure disorder, second-degree heart block s/p PPM placement, PVD s/p multiple stent placements (11/2019), h/o CVA, Emphysema, HTN, HLD,TBI with residual memory deficit, seizures, chronic hepatitis C, bipolar disorder, h/o substance abuse, PTSD and tobacco use whowas admitted with  acute hypoxic respiratory failure secondary to CAP and COPD with acute exacerbation.Pt admitted 6/25 and intubated 6/29. Has been slow to recover. Head CT performed and shows no acute changes. Patienthadeye deviation prompting EEG which was only significant for slowing with triphasic waves consistent with a metabolic encephalopathy. Patient much improved today.     Recommendations: 1. Continue Trileptal     LOS: 12 days   Alexis Goodell, MD Neurology [phone] 04/01/2020  10:33 AM

## 2020-04-01 NOTE — Progress Notes (Signed)
Patient extubated successfully.  Initially she was pausing her respirations for roughly 10 seconds.  Placed patient on Bag valve mask and administered breaths while BiPAP was initiated by RT.  Patient responded to BiPAP well and stabilized HR 55, RR 24 and O2 saturations 100%.

## 2020-04-01 NOTE — Consult Note (Signed)
[facility] for Electrolyte Monitoring and Replacement   Recent Labs: Potassium (mmol/L)  Date Value  04/01/2020 3.7   Magnesium (mg/dL)  Date Value  04/01/2020 2.2   Calcium (mg/dL)  Date Value  04/01/2020 8.7 (L)   Albumin (g/dL)  Date Value  04/01/2020 2.5 (L)  06/25/2019 4.5   Phosphorus (mg/dL)  Date Value  04/01/2020 3.8   Sodium (mmol/L)  Date Value  04/01/2020 144  09/17/2015 136   Corrected Ca: 9.9 mg/dL  Assessment: 64 y.o.femalewith PMH of COPD, Seizure disorder, second-degree heart block s/p PPM placement, PVD s/p multiple stent placements (11/2019), h/o CVA, Emphysema, HTN, HLD,TBI with residual memory deficit, seizures, chronic hepatitis C, bipolar disorder, h/o substance abuse, PTSD and tobacco use whowas admitted with acute hypoxic respiratory failure secondary to CAP and COPD with acute exacerbation. Pharmacy has been asked to evaluate daily for electrolye replacement.  Goal of Therapy:  Potassium 4.0 - 5.1 mmol/L Magnesium 2.0 - 2.4 mg/dL All Other Electrolytes WNL  Plan:   40 mEq po x 1  Re-check electrolytes with am lab 7/8  Dallie Piles ,PharmD Clinical Pharmacist 04/01/2020 6:58 AM

## 2020-04-01 NOTE — Progress Notes (Signed)
PT Cancellation Note  Patient Details Name: Cynthia [name] MRN: [id number] DOB: 11-Mar-1956   Cancelled Treatment:    Reason Eval/Treat Not Completed: Other (comment).  Consult reviewed and chart reviewed. Pt intubated 6/29 and now extubated this date. Will hold until tomorrow prior to performing exertional activity. Will re-attempt next date.    Shareka Casale 04/01/2020, 12:04 PM Greggory Stallion, PT, DPT [phone]

## 2020-04-02 ENCOUNTER — Inpatient Hospital Stay: Payer: Medicare Other

## 2020-04-02 DIAGNOSIS — R531 Weakness: Secondary | ICD-10-CM | POA: Diagnosis not present

## 2020-04-02 LAB — CBC WITH DIFFERENTIAL/PLATELET
Abs Immature Granulocytes: 0.04 10*3/uL (ref 0.00–0.07)
Basophils Absolute: 0 10*3/uL (ref 0.0–0.1)
Basophils Relative: 0 %
Eosinophils Absolute: 0 10*3/uL (ref 0.0–0.5)
Eosinophils Relative: 0 %
HCT: 33.4 % — ABNORMAL LOW (ref 36.0–46.0)
Hemoglobin: 10.5 g/dL — ABNORMAL LOW (ref 12.0–15.0)
Immature Granulocytes: 1 %
Lymphocytes Relative: 12 %
Lymphs Abs: 0.9 10*3/uL (ref 0.7–4.0)
MCH: 30.4 pg (ref 26.0–34.0)
MCHC: 31.4 g/dL (ref 30.0–36.0)
MCV: 96.8 fL (ref 80.0–100.0)
Monocytes Absolute: 0.4 10*3/uL (ref 0.1–1.0)
Monocytes Relative: 5 %
Neutro Abs: 6 10*3/uL (ref 1.7–7.7)
Neutrophils Relative %: 82 %
Platelets: 187 10*3/uL (ref 150–400)
RBC: 3.45 MIL/uL — ABNORMAL LOW (ref 3.87–5.11)
RDW: 14.8 % (ref 11.5–15.5)
WBC: 7.3 10*3/uL (ref 4.0–10.5)
nRBC: 0 % (ref 0.0–0.2)

## 2020-04-02 LAB — BASIC METABOLIC PANEL
Anion gap: 9 (ref 5–15)
BUN: 36 mg/dL — ABNORMAL HIGH (ref 8–23)
CO2: 27 mmol/L (ref 22–32)
Calcium: 8.8 mg/dL — ABNORMAL LOW (ref 8.9–10.3)
Chloride: 110 mmol/L (ref 98–111)
Creatinine, Ser: 0.95 mg/dL (ref 0.44–1.00)
GFR calc Af Amer: >60 mL/min (ref >60)
GFR calc non Af Amer: >60 mL/min (ref >60)
Glucose, Bld: 129 mg/dL — ABNORMAL HIGH (ref 70–99)
Potassium: 4.4 mmol/L (ref 3.5–5.1)
Sodium: 146 mmol/L — ABNORMAL HIGH (ref 135–145)

## 2020-04-02 LAB — GLUCOSE, CAPILLARY
Glucose-Capillary: 111 mg/dL — ABNORMAL HIGH (ref 70–99)
Glucose-Capillary: 161 mg/dL — ABNORMAL HIGH (ref 70–99)
Glucose-Capillary: 149 mg/dL — ABNORMAL HIGH (ref 70–99)
Glucose-Capillary: 150 mg/dL — ABNORMAL HIGH (ref 70–99)

## 2020-04-02 LAB — MAGNESIUM: Magnesium: 2.3 mg/dL (ref 1.7–2.4)

## 2020-04-02 LAB — PHOSPHORUS: Phosphorus: 3.6 mg/dL (ref 2.5–4.6)

## 2020-04-02 MED ORDER — INSULIN ASPART 100 UNIT/ML ~~LOC~~ SOLN
0.0000 [IU] | Freq: Three times a day (TID) | SUBCUTANEOUS | Status: DC
  Administered 2020-04-02 – 2020-04-03 (×2): 3 [IU] via SUBCUTANEOUS
  Administered 2020-04-03: 4 [IU] via SUBCUTANEOUS
  Administered 2020-04-04 – 2020-04-05 (×3): 3 [IU] via SUBCUTANEOUS
  Administered 2020-04-05 – 2020-04-07 (×2): 4 [IU] via SUBCUTANEOUS
  Filled 2020-04-02 (×8): qty 1

## 2020-04-02 NOTE — Progress Notes (Signed)
Nutrition Follow-up  DOCUMENTATION CODES:   Not applicable  INTERVENTION:  Provide honey thick Mighty Shake po BID with lunch and dinner, each supplement provides 200 kcal and 7 grams of protein.  Provide Magic cup TID with meals, each supplement provides 290 kcal and 9 grams of protein.  NUTRITION DIAGNOSIS:   Increased nutrient needs related to catabolic illness (COPD) as evidenced by estimated needs.  New nutrition diagnosis.  GOAL:   Patient will meet greater than or equal to 90% of their needs  Progressing with diet advancement.  MONITOR:   PO intake, Supplement acceptance, Diet advancement, Labs, Weight trends, Skin, I & O's  REASON FOR ASSESSMENT:   Ventilator, Consult Assessment of nutrition requirement/status, Enteral/tube feeding initiation and management  ASSESSMENT:   64 year old female with PMHx of bipolar disorder, TBI, HTN, CAD, PVD, COPD, seizure disorder admitted with acute exacerbation of COPD, multifocal PNA, A-fib with RVR.  -Patient extubated 7/7. -Diet advanced to dysphagia 1 with honey-thick liquids following SLP evaluation today.  Met with patient and husband at bedside. Patient was sleeping at time of RD assessment. Husband reported he had stepped out of the room briefly and patient had fallen asleep. He reports she did well with swallow evaluation this morning and had eaten almost one full serving of applesauce. He reports he is going to wait until she wakes up to feed her breakfast tray that was sitting at bedside. Husband reports patient would enjoy Magic Cup and prefers chocolate flavor.  Medications reviewed and include: folic acid 1 mg daily per tube, Novolog 0-20 units Q4hrs, Lantus 10 units daily, lactulose 10 grams BID per tube, MVI daily, Protonix, Miralax, senna 1 tablet QHS, thiamine 100 mg daily.  Labs reviewed: CBG 111-161, Sodium 146, BUN 36.  I/O: 1325 mL UOP Yesterday (0.9 mL/kg/hr); 50 mL output in rectal pouch  yesterday  Weight trend: 60 kg on 7/8; -5.3 kg from 6/26  Discussed with RN and on rounds.  Diet Order:   Diet Order            DIET - DYS 1 Room service appropriate? Yes; Fluid consistency: Honey Thick  Diet effective now                EDUCATION NEEDS:   No education needs have been identified at this time  Skin:  Skin Assessment: Skin Integrity Issues: (ecchymosis; MASD to buttocks)  Last BM:  04/02/2020 - medium type 7 per rectal tube  Height:   Ht Readings from Last 1 Encounters:  03/30/20 5' 5.98" (1.676 m)   Weight:   Wt Readings from Last 1 Encounters:  04/02/20 60 kg   BMI:  Body mass index is 21.36 kg/m.  Estimated Nutritional Needs:   Kcal:  1800-2000  Protein:  90-100 grams  Fluid:  1.8-2 L/day  Jacklynn Barnacle, MS, RD, LDN Pager number available on Amion
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
## 2020-04-02 NOTE — Progress Notes (Signed)
Spoke with Physician about transporting patient to CT without RN present.  Physician stated this would be appropriate.

## 2020-04-02 NOTE — Progress Notes (Signed)
Patient is confused this morning.  She refuses to accept direction when asking for water regardless of education pertaining to aspiration and choking.  Explained Speech therapy will perform a swallow evaluation.  Patient refuses to acknowledge teach back.  She continues to yell out "help me" and "water".

## 2020-04-02 NOTE — Progress Notes (Signed)
Cynthia [name] [facility] Card[facility]    SUBJECTIVE: Patient extubated encephalopathy improved alert answering questions denies any chest pain palpitations no nausea vomiting getting chest PT   Vitals:   04/02/20 0749 04/02/20 0800 04/02/20 0900 04/02/20 1000  BP:  (!) 144/50 (!) 134/49 (!) 125/43  Pulse:  (!) 55 (!) 57 (!) 55  Resp:  19 18 (!) 22  Temp:      TempSrc:      SpO2: 97% 99% 95% 94%  Weight:      Height:         Intake/Output Summary (Last 24 hours) at 04/02/2020 1304 Last data filed at 04/02/2020 1046 Gross per 24 hour  Intake 22.95 ml  Output 1375 ml  Net -1352.05 ml      PHYSICAL EXAM  General: Well developed, well nourished, in no acute distress HEENT:  Normocephalic and atramatic Neck:  No JVD.  Lungs: Clear bilaterally to auscultation and percussion. Heart: HRRR . Normal S1 and S2 without gallops or murmurs.  Abdomen: Bowel sounds are positive, abdomen soft and non-tender  Msk:  Back normal, normal gait. Normal strength and tone for age. Extremities: No clubbing, cyanosis or edema.   Neuro: Alert and oriented X 3. Psych:  Good affect, responds appropriately   LABS: Basic Metabolic Panel: Recent Labs    04/01/20 0414 04/02/20 0415  NA 144 146*  K 3.7 4.4  CL 106 110  CO2 33* 27  GLUCOSE 124* 129*  BUN 41* 36*  CREATININE 0.90 0.95  CALCIUM 8.7* 8.8*  MG 2.2 2.3  PHOS 3.8 3.6   Liver Function Tests: Recent Labs    03/31/20 0503 04/01/20 0414  AST 25 19  ALT 46* 35  ALKPHOS 57 50  BILITOT 0.8 0.7  PROT 6.3* 5.7*  ALBUMIN 2.7* 2.5*   No results for input(s): LIPASE, AMYLASE in the last 72 hours. CBC: Recent Labs    04/01/20 0414 04/02/20 0415  WBC 10.0 7.3  NEUTROABS 8.6* 6.0  HGB 10.0* 10.5*  HCT 32.2* 33.4*  MCV 98.5 96.8  PLT 153 187   Cardiac Enzymes: No results for input(s): CKTOTAL, CKMB, CKMBINDEX, TROPONINI in the last 72 hours. BNP: Invalid input(s): POCBNP D-Dimer: No results for input(s): DDIMER in the last 72 hours. Hemoglobin  A1C: No results for input(s): HGBA1C in the last 72 hours. Fasting Lipid Panel: No results for input(s): CHOL, HDL, LDLCALC, TRIG, CHOLHDL, LDLDIRECT in the last 72 hours. Thyroid Function Tests: No results for input(s): TSH, T4TOTAL, T3FREE, THYROIDAB in the last 72 hours.  Invalid input(s): FREET3 Anemia Panel: No results for input(s): VITAMINB12, FOLATE, FERRITIN, TIBC, IRON, RETICCTPCT in the last 72 hours.  No results found.   Echo preserved left ventricular function ejection fraction around 50%  TELEMETRY: Normal sinus rhythm occasional paced beats:  ASSESSMENT AND PLAN:  Principal Problem:   CAP (community acquired pneumonia) Active Problems:   Essential hypertension, benign   Traumatic brain injury ([place])   Seizures ([place])   Bipolar affective disorder ([place])   Artificial cardiac pacemaker   COPD with acute exacerbation ([place])   AKI (acute kidney injury) ([place])   Acute respiratory failure ([place])    Plan Patient extubated encephalopathy improving continue aggressive therapy for respiratory infection Maintain broad-spectrum antibiotics for pneumonia Continue aggressive pulmonary toilet Continue blood pressure control as necessary Maintain inhalers for COPD Continue bipolar and psychiatric medications Continue anticoagulation for atrial fibrillation Switch amiodarone to p.o. if swallowing is found to be okay Maintain Protonix therapy for reflux type symptoms Permanent  pacemaker appears to be functioning adequately will proceed with pacer interrogation as an outpatient   Yolonda Kida, MD 04/02/2020 1:04 PM

## 2020-04-02 NOTE — Progress Notes (Signed)
Patient cardiac monitor recorded false V-tach during patient physiotherapy.  Cardiologist was called to the bedside to verify the anomaly.

## 2020-04-02 NOTE — Evaluation (Signed)
Physical Therapy Re-Evaluation Patient Details Name: Cynthia [name] MRN: [id number] DOB: 1956/03/26 Today's Date: 04/02/2020   History of Present Illness  64 year old female presents for re-evaluation with CAP. Intubated 6-29 and extubated 7-7. Pt with extensive mental health history including bipolar disorder, history of traumatic brain disorder, HTN, coronary disease, PVD, COPD, chronic tobacco abuse, seizure disorder with recent admission via ER s/p several days of worsening SOB and a cough.    Clinical Impression  Pt in bed with husband at bedside upon arrival to room. Pt agreeable to attempt participation. Pt presents with overall generalized weakness requiring max-total+2 A for rolling to R. Pt currently with rectal tube, foley catheter, and on O2. Pt was anxious and emotionally labile throughout session and requiring relaxation and breathing techniques secondary to pt stating that she couldn't breathe, "help" and "ice". Pt presents with LUE flaccidity and minimal active RUE movement. Noted movement in BLE however appear to be spastic in quality. Pt with poor functional activity tolerance therefore sitting and transfers not attempted. Anticipate pt will require +2 A to sit edge of bed and to perform sit <> stand transfers. Pt's vitals remained stable throughout session. Recommend continued skilled PT to address current deficits and SNF at discharge from acute stay to [facility] functional mobility and return to PLOF.    Follow Up Recommendations SNF    Equipment Recommendations  None recommended by PT    Recommendations for Other Services       Precautions / Restrictions Precautions Precautions: Fall;Other (comment) (rectal tube) Restrictions Weight Bearing Restrictions: No      Mobility  Bed Mobility Overal bed mobility: Needs Assistance Bed Mobility: Rolling Rolling: Max assist;+2 for physical assistance         General bed mobility comments: Max-total+2 A for rolling to R; pt  noted to initiate truncal movement then required assistance to get into full sidelying and return to semi-supine  Transfers                 General transfer comment: not attempted  Ambulation/Gait                Stairs            Wheelchair Mobility    Modified Rankin (Stroke Patients Only)       Balance       Sitting balance - Comments: not attempted       Standing balance comment: not attempted                             Pertinent Vitals/Pain Pain Assessment: Faces Faces Pain Scale: Hurts even more Pain Location: "all over" Pain Descriptors / Indicators: Aching;Discomfort;Grimacing Pain Intervention(s): Monitored during session;Limited activity within patient's tolerance;Repositioned;Relaxation    Home Living Family/patient expects to be discharged to:: Private residence Living Arrangements: Spouse/significant other Available Help at Discharge: Family;Available PRN/intermittently Type of Home: House Home Access: Stairs to enter Entrance Stairs-Rails: Can reach both Entrance Stairs-Number of Steps: 3 (per husband) Home Layout: One level Home Equipment: Walker - 2 wheels;Cane - single point;Grab bars - tub/shower      Prior Function Level of Independence: Needs assistance   Gait / Transfers Assistance Needed: Husband reports numerous falls in 2021 and states that she was indpenedent with transfers and ambulation however utilized a rollator occasionally for steadying.   ADL's / Homemaking Assistance Needed: Husband states that he assisted with ADLs and does all the cooking but pt is able  to use the microwave for meals.        Hand Dominance        Extremity/Trunk Assessment   Upper Extremity Assessment Upper Extremity Assessment: LUE deficits/detail;RUE deficits/detail RUE Deficits / Details: grip 3+/5, active elbow flexion and wrist flexion in limited range; no other noted active movement in RUE RUE Sensation: WNL RUE  Coordination: decreased gross motor;decreased fine motor LUE Deficits / Details: LUE flaccid at this time except 2-/5 grip strength LUE: Unable to fully assess due to immobilization LUE Sensation:  (pt reported diff in LUE and RUE but couldn't state what) LUE Coordination: decreased gross motor;decreased fine motor    Lower Extremity Assessment Lower Extremity Assessment: Generalized weakness (movements seemed spastic in quality in BLE)       Communication   Communication: No difficulties  Cognition Arousal/Alertness: Awake/alert Behavior During Therapy: WFL for tasks assessed/performed;Anxious Overall Cognitive Status: Within Functional Limits for tasks assessed (husband available to answer questions )                                 General Comments: Pt emotionally labile switching quickly from calm and quiet to wanting to cry and stating "help...ice...help.Marland KitchenMarland KitchenI can't breathe."      General Comments      Exercises Other Exercises Other Exercises: active AP x 7 bilat and active heel slides x 3 bilat; pt with poor control at hip and LEs fell into external rotation with heel slides   Assessment/Plan    PT Assessment Patient needs continued PT services  PT Problem List Decreased strength;Decreased activity tolerance;Decreased balance;Decreased mobility;Decreased coordination;Decreased cognition;Pain       PT Treatment Interventions DME instruction;Gait training;Stair training;Functional mobility training;Therapeutic activities;Therapeutic exercise;Balance training;Neuromuscular re-education;Patient/family education    PT Goals (Current goals can be found in the Care Plan section)  Acute Rehab PT Goals Patient Stated Goal: go home PT Goal Formulation: With patient Time For Goal Achievement: 04/16/20 Potential to Achieve Goals: Fair    Frequency Min 2X/week   Barriers to discharge        Co-evaluation               AM-PAC PT "6 Clicks" Mobility   Outcome Measure Help needed turning from your back to your side while in a flat bed without using bedrails?: Total Help needed moving from lying on your back to sitting on the side of a flat bed without using bedrails?: Total Help needed moving to and from a bed to a chair (including a wheelchair)?: Total Help needed standing up from a chair using your arms (e.g., wheelchair or bedside chair)?: Total Help needed to walk in hospital room?: Total Help needed climbing 3-5 steps with a railing? : Total 6 Click Score: 6    End of Session Equipment Utilized During Treatment: Oxygen Activity Tolerance: Patient limited by fatigue Patient left: in bed;with family/visitor present;with SCD's reapplied Nurse Communication: Mobility status PT Visit Diagnosis: Repeated falls (R29.6);Muscle weakness (generalized) (M62.81);History of falling (Z91.81);Pain Pain - part of body:  ("all over")    Time: 7106-2694 PT Time Calculation (min) (ACUTE ONLY): 32 min   Charges:              Vale Haven, SPT  Vale Haven 04/02/2020, 4:39 PM

## 2020-04-02 NOTE — Progress Notes (Signed)
Subjective: Patient extubated.  Awake and alert.  Follows commands.  Objective: Current vital signs: BP (!) 148/52   Pulse (!) 56   Temp 98.2 F (36.8 C) (Oral)   Resp (!) 21   Ht 5' 5.98" (1.676 m)   Wt 60 kg   SpO2 97%   BMI 21.36 kg/m  Vital signs in last 24 hours: Temp:  [97.7 F (36.5 C)-98.6 F (37 C)] 98.2 F (36.8 C) (07/08 0400) Pulse Rate:  [55-63] 56 (07/08 0700) Resp:  [16-26] 21 (07/08 0700) BP: (100-156)/(43-53) 148/52 (07/08 0700) SpO2:  [91 %-100 %] 97 % (07/08 0749) FiO2 (%):  [35 %-50 %] 35 % (07/07 2329) Weight:  [60 kg] 60 kg (07/08 0335)  Intake/Output from previous day: 07/07 0701 - 07/08 0700 In: 23 [I.V.:23] Out: 6789 [Urine:1325; Stool:50] Intake/Output this shift: No intake/output data recorded. Nutritional status:  Diet Order            DIET - DYS 1 Room service appropriate? Yes; Fluid consistency: Honey Thick  Diet effective now                 Neurologic Exam: Mental Status: Alert, oriented, thought content appropriate.  Speech fluent without evidence of aphasia.  Mild dysarthria. Able to follow commands. Cranial Nerves: II: Visual fields grossly normal, pupils equal, round, reactive to light and accommodation III,IV, VI: Eextra-ocular motions intact bilaterally V,VII: smile symmetric, facial light touch sensation normal bilaterally VIII: hearing normal bilaterally IX,X: gag reflex present XI: bilateral shoulder shrug XII: midline tongue extension Motor: Able to lift RUE weakly off the bed.  Unable to lift the LUE.  Lifts both lower extremities off the bed Sensory: Pinprick and light touch intact throughout, bilaterally   Lab Results: Basic Metabolic Panel: Recent Labs  Lab 03/29/20 0426 03/29/20 0426 03/30/20 0606 03/30/20 0606 03/31/20 0503 04/01/20 0414 04/02/20 0415  NA 147*  --  143  --  141 144 146*  K 4.5  --  3.8  --  3.2* 3.7 4.4  CL 110  --  105  --  101 106 110  CO2 30  --  28  --  30 33* 27  GLUCOSE  290*  --  270*  --  183* 124* 129*  BUN 55*  --  60*  --  49* 41* 36*  CREATININE 1.03*  --  1.20*  --  1.08* 0.90 0.95  CALCIUM 9.6   < > 9.4   < > 9.1 8.7* 8.8*  MG 2.3  --  2.1  --  2.2 2.2 2.3  PHOS 3.9  --  3.9  --  4.3 3.8 3.6   < > = values in this interval not displayed.    Liver Function Tests: Recent Labs  Lab 03/28/20 0425 03/29/20 0426 03/30/20 0606 03/31/20 0503 04/01/20 0414  AST 11* 19 32 25 19  ALT 34 35 49* 46* 35  ALKPHOS 56 65 63 57 50  BILITOT 0.7 0.7 0.8 0.8 0.7  PROT 6.7 6.8 6.7 6.3* 5.7*  ALBUMIN 2.4* 2.7* 2.7* 2.7* 2.5*   No results for input(s): LIPASE, AMYLASE in the last 168 hours. Recent Labs  Lab 03/27/20 1057 03/28/20 0426 03/29/20 0430  AMMONIA 51* 29 36*    CBC: Recent Labs  Lab 03/29/20 0426 03/30/20 0456 03/31/20 0503 04/01/20 0414 04/02/20 0415  WBC 12.8* 16.9* 14.9* 10.0 7.3  NEUTROABS 11.7* 15.9* 13.3* 8.6* 6.0  HGB 13.2 12.1 11.3* 10.0* 10.5*  HCT 44.2 38.3 36.6 32.2*  33.4*  MCV 98.4 95.8 97.3 98.5 96.8  PLT 170 185 176 153 187    Cardiac Enzymes: No results for input(s): CKTOTAL, CKMB, CKMBINDEX, TROPONINI in the last 168 hours.  Lipid Panel: No results for input(s): CHOL, TRIG, HDL, CHOLHDL, VLDL, LDLCALC in the last 168 hours.  CBG: Recent Labs  Lab 04/01/20 1636 04/01/20 1927 04/01/20 2347 04/02/20 0327 04/02/20 0733  GLUCAP 129* 112* 132* 111* 161*    Microbiology: Results for orders placed or performed during the hospital encounter of 03/20/20  SARS Coronavirus 2 by RT PCR (hospital order, performed in [facility] hospital lab) Nasopharyngeal Nasopharyngeal Swab     Status: None   Collection Time: 03/20/20  8:19 PM   Specimen: Nasopharyngeal Swab  Result Value Ref Range Status   SARS Coronavirus 2 NEGATIVE NEGATIVE Final    Comment: (NOTE) SARS-CoV-2 target nucleic acids are NOT DETECTED.  The SARS-CoV-2 RNA is generally detectable in upper and lower respiratory specimens during the acute phase of  infection. The lowest concentration of SARS-CoV-2 viral copies this assay can detect is 250 copies / mL. A negative result does not preclude SARS-CoV-2 infection and should not be used as the sole basis for treatment or other patient management decisions.  A negative result may occur with improper specimen collection / handling, submission of specimen other than nasopharyngeal swab, presence of viral mutation(s) within the areas targeted by this assay, and inadequate number of viral copies (<250 copies / mL). A negative result must be combined with clinical observations, patient history, and epidemiological information.  Fact Sheet for Patients:   StrictlyIdeas.no  Fact Sheet for Healthcare Providers: BankingDealers.co.za  This test is not yet approved or  cleared by the [place] FDA and has been authorized for detection and/or diagnosis of SARS-CoV-2 by FDA under an Emergency Use Authorization (EUA).  This EUA will remain in effect (meaning this test can be used) for the duration of the COVID-19 declaration under Section 564(b)(1) of the Act, 21 U.S.C. section 360bbb-3(b)(1), unless the authorization is terminated or revoked sooner.  Performed at [facility], [address] [place], [place] [address]   Blood Culture (routine x 2)     Status: None   Collection Time: 03/20/20  9:17 PM   Specimen: BLOOD  Result Value Ref Range Status   Specimen Description BLOOD LEFT FOREARM  Final   Special Requests   Final    BOTTLES DRAWN AEROBIC AND ANAEROBIC Blood Culture results may not be optimal due to an inadequate volume of blood received in culture bottles   Culture   Final    NO GROWTH 5 DAYS Performed at [facility], [address] [place], [place] [address]    Report Status 03/25/2020 FINAL  Final  Blood Culture (routine x 2)     Status: None   Collection Time: 03/20/20 10:11 PM   Specimen: BLOOD  Result  Value Ref Range Status   Specimen Description BLOOD RIGHT FOREARM  Final   Special Requests   Final    BOTTLES DRAWN AEROBIC AND ANAEROBIC Blood Culture results may not be optimal due to an inadequate volume of blood received in culture bottles   Culture   Final    NO GROWTH 5 DAYS Performed at [facility], [address], [place], [place] [address]    Report Status 03/25/2020 FINAL  Final  MRSA PCR Screening     Status: None   Collection Time: 03/21/20  2:07 AM   Specimen:  Nasal Mucosa; Nasopharyngeal  Result Value Ref Range Status   MRSA by PCR NEGATIVE NEGATIVE Final    Comment:        The GeneXpert MRSA Assay (FDA approved for NASAL specimens only), is one component of a comprehensive MRSA colonization surveillance program. It is not intended to diagnose MRSA infection nor to guide or monitor treatment for MRSA infections. Performed at [facility], [address] [place], [place] [address]   SARS Coronavirus 2 by RT PCR (hospital order, performed in [facility] hospital lab)     Status: None   Collection Time: 03/23/20 10:12 AM  Result Value Ref Range Status   SARS Coronavirus 2 NEGATIVE NEGATIVE Final    Comment: (NOTE) SARS-CoV-2 target nucleic acids are NOT DETECTED.  The SARS-CoV-2 RNA is generally detectable in upper and lower respiratory specimens during the acute phase of infection. The lowest concentration of SARS-CoV-2 viral copies this assay can detect is 250 copies / mL. A negative result does not preclude SARS-CoV-2 infection and should not be used as the sole basis for treatment or other patient management decisions.  A negative result may occur with improper specimen collection / handling, submission of specimen other than nasopharyngeal swab, presence of viral mutation(s) within the areas targeted by this assay, and inadequate number of viral copies (<250 copies / mL). A negative result must be combined with clinical observations,  patient history, and epidemiological information.  Fact Sheet for Patients:   StrictlyIdeas.no  Fact Sheet for Healthcare Providers: BankingDealers.co.za  This test is not yet approved or  cleared by the [place] FDA and has been authorized for detection and/or diagnosis of SARS-CoV-2 by FDA under an Emergency Use Authorization (EUA).  This EUA will remain in effect (meaning this test can be used) for the duration of the COVID-19 declaration under Section 564(b)(1) of the Act, 21 U.S.C. section 360bbb-3(b)(1), unless the authorization is terminated or revoked sooner.  Performed at [facility], [address] [place], [place] [address]   Culture, respiratory (non-expectorated)     Status: None   Collection Time: 03/24/20  4:39 PM   Specimen: Tracheal Aspirate; Respiratory  Result Value Ref Range Status   Specimen Description   Final    TRACHEAL ASPIRATE Performed at [facility], [address] [place], [place] [address]    Special Requests   Final    NONE Performed at [facility], [address] [place], [place] [address]    Gram Stain   Final    RARE WBC PRESENT, PREDOMINANTLY PMN FEW SQUAMOUS EPITHELIAL CELLS PRESENT NO ORGANISMS SEEN    Culture   Final    NO GROWTH Performed at [facility], [place] [address], [place], [place] [address]    Report Status 03/25/2020 FINAL  Final  Expectorated sputum assessment w rflx to resp cult     Status: None   Collection Time: 03/25/20 11:21 AM   Specimen: Sputum  Result Value Ref Range Status   Specimen Description SPUTUM  Final   Special Requests Normal  Final   Sputum evaluation   Final    THIS SPECIMEN IS ACCEPTABLE FOR SPUTUM CULTURE Performed at [facility], [address], [place], [place] [address]    Report Status 03/25/2020 FINAL  Final  Culture, respiratory     Status: None   Collection Time: 03/25/20 11:21 AM    Specimen: SPU  Result Value Ref Range Status   Specimen Description   Final  SPUTUM Performed at [facility], [address] [place], [place] [address]    Special Requests   Final    Normal Reflexed from [phone] Performed at [facility], [place], [place] [address]    Gram Stain NO WBC SEEN NO ORGANISMS SEEN   Final   Culture   Final    NO GROWTH 2 DAYS Performed at [facility], [place] [address], [place], [place] [address]    Report Status 03/28/2020 FINAL  Final    Coagulation Studies: No results for input(s): LABPROT, INR in the last 72 hours.  Imaging: No results found.  Medications:  I have reviewed the patient's current medications. Scheduled: . amLODipine  5 mg Per Tube Daily  . budesonide (PULMICORT) nebulizer solution  0.5 mg Nebulization BID  . chlorhexidine gluconate (MEDLINE KIT)  15 mL Mouth Rinse BID  . Chlorhexidine Gluconate Cloth  6 each Topical Daily  . clopidogrel  75 mg Per Tube Daily  . folic acid  1 mg Per Tube Daily  . heparin  5,000 Units Subcutaneous Q8H  . insulin aspart  0-20 Units Subcutaneous Q4H  . insulin glargine  10 Units Subcutaneous Daily  . ipratropium-albuterol  3 mL Nebulization Q4H  . lactulose  10 g Per Tube BID  . multivitamin with minerals  1 tablet Per Tube Daily  . Oxcarbazepine  300 mg Per Tube QHS  . pantoprazole sodium  40 mg Per Tube Daily  . polyethylene glycol  17 g Per Tube Daily  . senna  1 tablet Per Tube QHS  . sodium chloride flush  10-40 mL Intracatheter Q12H  . thiamine  100 mg Oral Daily   Or  . thiamine  100 mg Intravenous Daily    Assessment/Plan: 64 y.o.femalewith PMH of COPD, Seizure disorder, second-degree heart block s/p PPM placement, PVD s/p multiple stent placements (11/2019), h/o CVA, Emphysema, HTN, HLD,TBI with residual memory deficit, seizures, chronic hepatitis C, bipolar disorder, h/o substance abuse, PTSD and tobacco use whowas admitted with acute  hypoxic respiratory failure secondary to CAP and COPD with acute exacerbation.Pt admitted 6/25 and intubated 6/29. Has been slow to recover. Head CT performed and shows no acute changes. Patienthadeye deviation prompting EEG which was only significant for slowing with triphasic waves consistent with a metabolic encephalopathy. Patient much improved today.  Now extubated.  UE weakness appreciated out of proportion to that in the LE's, left greater than right.  This may just be related to her slow improvement but will repeat head imaging and image cervical spine to rule out other etiologies.    Recommendations: 1. CT of the head and cervical spine without contrast   LOS: 13 days   Alexis Goodell, MD Neurology [phone] 04/02/2020  10:09 AM
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
## 2020-04-02 NOTE — Progress Notes (Signed)
Patient remains Aox3.  She does not express a proper knowledge of the time of day and day of year.  She spent the majority of the shift with her husband by her bedside.  Her lung sounds are diminished with some rhonchi auscultated in the upper lobes.  She is saturating 95% on 4L [place].  Her HR remains bradycardic and she is receiving amiodarone 30mg /hr IV infusion.  Her foley cath is still in place with urine yellow and clear.  Her rectal tube is in place and her output is consistent. She is wearing heel protector boots on both legs along with the SCD's.  She has been turned Q2 hrs for skin therapy to reduce possible breakdown. She is now consuming a honey thick diet.  Administer PO meds crushed in apple sauce; Patient responds well.

## 2020-04-02 NOTE — Consult Note (Signed)
[facility] Electrolyte Monitoring and Replacement   Recent Labs: Potassium (mmol/L)  Date Value  04/02/2020 4.4   Magnesium (mg/dL)  Date Value  04/02/2020 2.3   Calcium (mg/dL)  Date Value  04/02/2020 8.8 (L)   Albumin (g/dL)  Date Value  04/01/2020 2.5 (L)  06/25/2019 4.5   Phosphorus (mg/dL)  Date Value  04/02/2020 3.6   Sodium (mmol/L)  Date Value  04/02/2020 146 (H)  09/17/2015 136   Corrected Ca: 10 mg/dL  Assessment: 64 y.o.femalewith PMH of COPD, Seizure disorder, second-degree heart block s/p PPM placement, PVD s/p multiple stent placements (11/2019), h/o CVA, Emphysema, HTN, HLD,TBI with residual memory deficit, seizures, chronic hepatitis C, bipolar disorder, h/o substance abuse, PTSD and tobacco use whowas admitted with acute hypoxic respiratory failure secondary to CAP and COPD with acute exacerbation. Pharmacy has been asked to evaluate daily for electrolye replacement.  Goal of Therapy:  Potassium 4.0 - 5.1 mmol/L Magnesium 2.0 - 2.4 mg/dL All Other Electrolytes WNL  Plan:   No electrolyte replacement warranted today  Re-check electrolytes with am lab 7/9  Dallie Piles ,PharmD Clinical Pharmacist 04/02/2020 6:57 AM

## 2020-04-03 DIAGNOSIS — R569 Unspecified convulsions: Secondary | ICD-10-CM | POA: Diagnosis not present

## 2020-04-03 LAB — CBC WITH DIFFERENTIAL/PLATELET
Abs Immature Granulocytes: 0.05 10*3/uL (ref 0.00–0.07)
Basophils Absolute: 0 10*3/uL (ref 0.0–0.1)
Basophils Relative: 0 %
Eosinophils Absolute: 0 10*3/uL (ref 0.0–0.5)
Eosinophils Relative: 1 %
HCT: 34.9 % — ABNORMAL LOW (ref 36.0–46.0)
Hemoglobin: 11 g/dL — ABNORMAL LOW (ref 12.0–15.0)
Immature Granulocytes: 1 %
Lymphocytes Relative: 14 %
Lymphs Abs: 0.9 10*3/uL (ref 0.7–4.0)
MCH: 30.5 pg (ref 26.0–34.0)
MCHC: 31.5 g/dL (ref 30.0–36.0)
MCV: 96.7 fL (ref 80.0–100.0)
Monocytes Absolute: 0.3 10*3/uL (ref 0.1–1.0)
Monocytes Relative: 5 %
Neutro Abs: 5.1 10*3/uL (ref 1.7–7.7)
Neutrophils Relative %: 79 %
Platelets: 202 10*3/uL (ref 150–400)
RBC: 3.61 MIL/uL — ABNORMAL LOW (ref 3.87–5.11)
RDW: 14.9 % (ref 11.5–15.5)
WBC: 6.4 10*3/uL (ref 4.0–10.5)
nRBC: 0 % (ref 0.0–0.2)

## 2020-04-03 LAB — GLUCOSE, CAPILLARY
Glucose-Capillary: 130 mg/dL — ABNORMAL HIGH (ref 70–99)
Glucose-Capillary: 174 mg/dL — ABNORMAL HIGH (ref 70–99)
Glucose-Capillary: 102 mg/dL — ABNORMAL HIGH (ref 70–99)

## 2020-04-03 LAB — BASIC METABOLIC PANEL
Anion gap: 4 — ABNORMAL LOW (ref 5–15)
BUN: 32 mg/dL — ABNORMAL HIGH (ref 8–23)
CO2: 28 mmol/L (ref 22–32)
Calcium: 8.6 mg/dL — ABNORMAL LOW (ref 8.9–10.3)
Chloride: 112 mmol/L — ABNORMAL HIGH (ref 98–111)
Creatinine, Ser: 0.87 mg/dL (ref 0.44–1.00)
GFR calc Af Amer: >60 mL/min (ref >60)
GFR calc non Af Amer: >60 mL/min (ref >60)
Glucose, Bld: 154 mg/dL — ABNORMAL HIGH (ref 70–99)
Potassium: 3.8 mmol/L (ref 3.5–5.1)
Sodium: 144 mmol/L (ref 135–145)

## 2020-04-03 LAB — MAGNESIUM: Magnesium: 2.3 mg/dL (ref 1.7–2.4)

## 2020-04-03 LAB — PHOSPHORUS: Phosphorus: 3.3 mg/dL (ref 2.5–4.6)

## 2020-04-03 MED ORDER — NICOTINE 21 MG/24HR TD PT24
21.0000 mg | MEDICATED_PATCH | Freq: Every day | TRANSDERMAL | Status: DC
  Administered 2020-04-03 – 2020-04-07 (×5): 21 mg via TRANSDERMAL
  Filled 2020-04-03 (×5): qty 1

## 2020-04-03 MED ORDER — OXCARBAZEPINE 300 MG PO TABS
300.0000 mg | ORAL_TABLET | Freq: Every day | ORAL | Status: DC
  Administered 2020-04-03 – 2020-04-06 (×4): 300 mg via ORAL
  Filled 2020-04-03 (×5): qty 1

## 2020-04-03 MED ORDER — FOLIC ACID 1 MG PO TABS
1.0000 mg | ORAL_TABLET | Freq: Every day | ORAL | Status: DC
  Administered 2020-04-03 – 2020-04-07 (×5): 1 mg via ORAL
  Filled 2020-04-03 (×5): qty 1

## 2020-04-03 MED ORDER — POLYETHYLENE GLYCOL 3350 17 G PO PACK
17.0000 g | PACK | Freq: Every day | ORAL | Status: DC
  Administered 2020-04-03 – 2020-04-07 (×5): 17 g via ORAL
  Filled 2020-04-03 (×5): qty 1

## 2020-04-03 MED ORDER — POTASSIUM CHLORIDE CRYS ER 20 MEQ PO TBCR
20.0000 meq | EXTENDED_RELEASE_TABLET | Freq: Once | ORAL | Status: AC
  Administered 2020-04-03: 20 meq via ORAL
  Filled 2020-04-03: qty 1

## 2020-04-03 MED ORDER — ACETAMINOPHEN 500 MG PO TABS
500.0000 mg | ORAL_TABLET | Freq: Four times a day (QID) | ORAL | Status: DC | PRN
  Administered 2020-04-03 – 2020-04-04 (×3): 500 mg via ORAL
  Filled 2020-04-03 (×4): qty 1

## 2020-04-03 MED ORDER — CHLORHEXIDINE GLUCONATE 0.12 % MT SOLN
OROMUCOSAL | Status: AC
  Administered 2020-04-03: 15 mL
  Filled 2020-04-03: qty 15

## 2020-04-03 MED ORDER — PANTOPRAZOLE SODIUM 40 MG PO PACK
40.0000 mg | PACK | Freq: Every day | ORAL | Status: DC
  Administered 2020-04-03 – 2020-04-07 (×5): 40 mg via ORAL
  Filled 2020-04-03 (×6): qty 20

## 2020-04-03 MED ORDER — KETOROLAC TROMETHAMINE 30 MG/ML IJ SOLN
30.0000 mg | Freq: Once | INTRAMUSCULAR | Status: AC
  Administered 2020-04-03: 30 mg via INTRAVENOUS
  Filled 2020-04-03: qty 1

## 2020-04-03 MED ORDER — ADULT MULTIVITAMIN W/MINERALS CH
1.0000 | ORAL_TABLET | Freq: Every day | ORAL | Status: DC
  Administered 2020-04-03 – 2020-04-07 (×5): 1 via ORAL
  Filled 2020-04-03 (×5): qty 1

## 2020-04-03 MED ORDER — SENNA 8.6 MG PO TABS
1.0000 | ORAL_TABLET | Freq: Every day | ORAL | Status: DC
  Administered 2020-04-03 – 2020-04-06 (×4): 8.6 mg via ORAL
  Filled 2020-04-03 (×4): qty 1

## 2020-04-03 MED ORDER — LACTULOSE 10 GM/15ML PO SOLN
10.0000 g | Freq: Two times a day (BID) | ORAL | Status: DC
  Administered 2020-04-03: 10 g via ORAL
  Filled 2020-04-03: qty 30

## 2020-04-03 MED ORDER — CLOPIDOGREL BISULFATE 75 MG PO TABS
75.0000 mg | ORAL_TABLET | Freq: Every day | ORAL | Status: DC
  Administered 2020-04-03 – 2020-04-07 (×5): 75 mg via ORAL
  Filled 2020-04-03 (×5): qty 1

## 2020-04-03 MED ORDER — AMLODIPINE BESYLATE 5 MG PO TABS
5.0000 mg | ORAL_TABLET | Freq: Every day | ORAL | Status: DC
  Administered 2020-04-03 – 2020-04-06 (×4): 5 mg via ORAL
  Filled 2020-04-03 (×4): qty 1

## 2020-04-03 NOTE — Progress Notes (Signed)
Subjective: Remains awake and alert.  Continues to show signs of improvement.  Objective: Current vital signs: BP (!) 148/50   Pulse (!) 51   Temp 98 F (36.7 C) (Oral)   Resp 17   Ht 5' 5.98" (1.676 m)   Wt 58.8 kg   SpO2 97%   BMI 20.93 kg/m  Vital signs in last 24 hours: Temp:  [97.7 F (36.5 C)-98.3 F (36.8 C)] 98 F (36.7 C) (07/09 0800) Pulse Rate:  [51-90] 51 (07/09 0900) Resp:  [12-22] 17 (07/09 0900) BP: (125-160)/(46-62) 148/50 (07/09 0900) SpO2:  [96 %-100 %] 97 % (07/09 0900) Weight:  [58.8 kg] 58.8 kg (07/09 0500)  Intake/Output from previous day: 07/08 0701 - 07/09 0700 In: 973.5 [P.O.:600; I.V.:373.5] Out: 1550 [Urine:1250; Stool:300] Intake/Output this shift: Total I/O In: 41.7 [I.V.:41.7] Out: -  Nutritional status:  Diet Order            DIET - DYS 1 Room service appropriate? Yes; Fluid consistency: Honey Thick  Diet effective now                 Neurologic Exam: Mental Status: Alert, oriented, thought content appropriate.  Speech fluent without evidence of aphasia.  Mild dysarthria. Able to follow commands. Cranial Nerves: II: Visual fields grossly normal, pupils equal, round, reactive to light and accommodation III,IV, VI: Eextra-ocular motions intact bilaterally V,VII: smile symmetric, facial light touch sensation normal bilaterally VIII: hearing normal bilaterally IX,X: gag reflex present XI: bilateral shoulder shrug XII: midline tongue extension Motor: Able to lift RUE off the bed.  Able to lift the LUE less so.  Continues to be able to lift the lower extremities.   Sensory: Pinprick and light touch intact throughout, bilaterally  Lab Results: Basic Metabolic Panel: Recent Labs  Lab 03/30/20 0606 03/30/20 0606 03/31/20 0503 03/31/20 0503 04/01/20 0414 04/02/20 0415 04/03/20 0456  NA 143  --  141  --  144 146* 144  K 3.8  --  3.2*  --  3.7 4.4 3.8  CL 105  --  101  --  106 110 112*  CO2 28  --  30  --  33* 27 28  GLUCOSE  270*  --  183*  --  124* 129* 154*  BUN 60*  --  49*  --  41* 36* 32*  CREATININE 1.20*  --  1.08*  --  0.90 0.95 0.87  CALCIUM 9.4   < > 9.1   < > 8.7* 8.8* 8.6*  MG 2.1  --  2.2  --  2.2 2.3 2.3  PHOS 3.9  --  4.3  --  3.8 3.6 3.3   < > = values in this interval not displayed.    Liver Function Tests: Recent Labs  Lab 03/28/20 0425 03/29/20 0426 03/30/20 0606 03/31/20 0503 04/01/20 0414  AST 11* 19 32 25 19  ALT 34 35 49* 46* 35  ALKPHOS 56 65 63 57 50  BILITOT 0.7 0.7 0.8 0.8 0.7  PROT 6.7 6.8 6.7 6.3* 5.7*  ALBUMIN 2.4* 2.7* 2.7* 2.7* 2.5*   No results for input(s): LIPASE, AMYLASE in the last 168 hours. Recent Labs  Lab 03/28/20 0426 03/29/20 0430  AMMONIA 29 36*    CBC: Recent Labs  Lab 03/30/20 0456 03/31/20 0503 04/01/20 0414 04/02/20 0415 04/03/20 0456  WBC 16.9* 14.9* 10.0 7.3 6.4  NEUTROABS 15.9* 13.3* 8.6* 6.0 5.1  HGB 12.1 11.3* 10.0* 10.5* 11.0*  HCT 38.3 36.6 32.2* 33.4* 34.9*  MCV  95.8 97.3 98.5 96.8 96.7  PLT 185 176 153 187 202    Cardiac Enzymes: No results for input(s): CKTOTAL, CKMB, CKMBINDEX, TROPONINI in the last 168 hours.  Lipid Panel: No results for input(s): CHOL, TRIG, HDL, CHOLHDL, VLDL, LDLCALC in the last 168 hours.  CBG: Recent Labs  Lab 04/02/20 0327 04/02/20 0733 04/02/20 1134 04/02/20 1614 04/03/20 0755  GLUCAP 111* 161* 149* 150* 130*    Microbiology: Results for orders placed or performed during the hospital encounter of 03/20/20  SARS Coronavirus 2 by RT PCR (hospital order, performed in [facility] hospital lab) Nasopharyngeal Nasopharyngeal Swab     Status: None   Collection Time: 03/20/20  8:19 PM   Specimen: Nasopharyngeal Swab  Result Value Ref Range Status   SARS Coronavirus 2 NEGATIVE NEGATIVE Final    Comment: (NOTE) SARS-CoV-2 target nucleic acids are NOT DETECTED.  The SARS-CoV-2 RNA is generally detectable in upper and lower respiratory specimens during the acute phase of infection. The  lowest concentration of SARS-CoV-2 viral copies this assay can detect is 250 copies / mL. A negative result does not preclude SARS-CoV-2 infection and should not be used as the sole basis for treatment or other patient management decisions.  A negative result may occur with improper specimen collection / handling, submission of specimen other than nasopharyngeal swab, presence of viral mutation(s) within the areas targeted by this assay, and inadequate number of viral copies (<250 copies / mL). A negative result must be combined with clinical observations, patient history, and epidemiological information.  Fact Sheet for Patients:   StrictlyIdeas.no  Fact Sheet for Healthcare Providers: BankingDealers.co.za  This test is not yet approved or  cleared by the [place] FDA and has been authorized for detection and/or diagnosis of SARS-CoV-2 by FDA under an Emergency Use Authorization (EUA).  This EUA will remain in effect (meaning this test can be used) for the duration of the COVID-19 declaration under Section 564(b)(1) of the Act, 21 U.S.C. section 360bbb-3(b)(1), unless the authorization is terminated or revoked sooner.  Performed at [facility], [address] [place], [place] [address]   Blood Culture (routine x 2)     Status: None   Collection Time: 03/20/20  9:17 PM   Specimen: BLOOD  Result Value Ref Range Status   Specimen Description BLOOD LEFT FOREARM  Final   Special Requests   Final    BOTTLES DRAWN AEROBIC AND ANAEROBIC Blood Culture results may not be optimal due to an inadequate volume of blood received in culture bottles   Culture   Final    NO GROWTH 5 DAYS Performed at [facility] [address] [place], [place] [address]    Report Status 03/25/2020 FINAL  Final  Blood Culture (routine x 2)     Status: None   Collection Time: 03/20/20 10:11 PM   Specimen: BLOOD  Result Value Ref Range  Status   Specimen Description BLOOD RIGHT FOREARM  Final   Special Requests   Final    BOTTLES DRAWN AEROBIC AND ANAEROBIC Blood Culture results may not be optimal due to an inadequate volume of blood received in culture bottles   Culture   Final    NO GROWTH 5 DAYS Performed at [facility], [address], [place], [place] [address]    Report Status 03/25/2020 FINAL  Final  MRSA PCR Screening     Status: None   Collection Time: 03/21/20  2:07 AM   Specimen: Nasal Mucosa; Nasopharyngeal  Result Value Ref Range Status   MRSA by PCR NEGATIVE NEGATIVE Final    Comment:        The GeneXpert MRSA Assay (FDA approved for NASAL specimens only), is one component of a comprehensive MRSA colonization surveillance program. It is not intended to diagnose MRSA infection nor to guide or monitor treatment for MRSA infections. Performed at [facility], [address] [place], [place] [address]   SARS Coronavirus 2 by RT PCR (hospital order, performed in [place] hospital lab)     Status: None   Collection Time: 03/23/20 10:12 AM  Result Value Ref Range Status   SARS Coronavirus 2 NEGATIVE NEGATIVE Final    Comment: (NOTE) SARS-CoV-2 target nucleic acids are NOT DETECTED.  The SARS-CoV-2 RNA is generally detectable in upper and lower respiratory specimens during the acute phase of infection. The lowest concentration of SARS-CoV-2 viral copies this assay can detect is 250 copies / mL. A negative result does not preclude SARS-CoV-2 infection and should not be used as the sole basis for treatment or other patient management decisions.  A negative result may occur with improper specimen collection / handling, submission of specimen other than nasopharyngeal swab, presence of viral mutation(s) within the areas targeted by this assay, and inadequate number of viral copies (<250 copies / mL). A negative result must be combined with clinical observations, patient history,  and epidemiological information.  Fact Sheet for Patients:   StrictlyIdeas.no  Fact Sheet for Healthcare Providers: BankingDealers.co.za  This test is not yet approved or  cleared by the [place] FDA and has been authorized for detection and/or diagnosis of SARS-CoV-2 by FDA under an Emergency Use Authorization (EUA).  This EUA will remain in effect (meaning this test can be used) for the duration of the COVID-19 declaration under Section 564(b)(1) of the Act, 21 U.S.C. section 360bbb-3(b)(1), unless the authorization is terminated or revoked sooner.  Performed at [facility], [address] [place], [place] [address]   Culture, respiratory (non-expectorated)     Status: None   Collection Time: 03/24/20  4:39 PM   Specimen: Tracheal Aspirate; Respiratory  Result Value Ref Range Status   Specimen Description   Final    TRACHEAL ASPIRATE Performed at [facility], [address] [place], [place] [address]    Special Requests   Final    NONE Performed at [facility], [address] [place], [place] [address]    Gram Stain   Final    RARE WBC PRESENT, PREDOMINANTLY PMN FEW SQUAMOUS EPITHELIAL CELLS PRESENT NO ORGANISMS SEEN    Culture   Final    NO GROWTH Performed at [facility], [place] [address], [place], [place] [address]    Report Status 03/25/2020 FINAL  Final  Expectorated sputum assessment w rflx to resp cult     Status: None   Collection Time: 03/25/20 11:21 AM   Specimen: Sputum  Result Value Ref Range Status   Specimen Description SPUTUM  Final   Special Requests Normal  Final   Sputum evaluation   Final    THIS SPECIMEN IS ACCEPTABLE FOR SPUTUM CULTURE Performed at [facility], [address], [place], [place] [address]    Report Status 03/25/2020 FINAL  Final  Culture, respiratory     Status: None   Collection Time: 03/25/20 11:21 AM   Specimen: SPU   Result Value Ref Range Status   Specimen Description   Final    SPUTUM  Performed at [facility], [address] [place], [place] [address]    Special Requests   Final    Normal Reflexed from [id number] Performed at [facility], [place], [place] [address]    Gram Stain NO WBC SEEN NO ORGANISMS SEEN   Final   Culture   Final    NO GROWTH 2 DAYS Performed at [facility], [place] [address] [place], [place] [address]    Report Status 03/28/2020 FINAL  Final    Coagulation Studies: No results for input(s): LABPROT, INR in the last 72 hours.  Imaging: CT HEAD WO CONTRAST  Result Date: 04/02/2020 CLINICAL DATA:  Upper extremity weakness, history of stroke EXAM: CT HEAD WITHOUT CONTRAST TECHNIQUE: Contiguous axial images were obtained from the base of the skull through the vertex without intravenous contrast. COMPARISON:  03/27/2020 FINDINGS: Brain: There is no acute intracranial hemorrhage, mass effect, or edema. No new loss of gray-white differentiation. There is no extra-axial fluid collection. Ventricles and sulci are stable in size and configuration. Patchy hypoattenuation in the supratentorial white matter is nonspecific but probably reflects stable chronic microvascular ischemic changes. Vascular: There is atherosclerotic calcification at the skull base. Skull: Calvarium is unremarkable. 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 No significant paranasal sinus opacification. No acute orbital finding. Other: Mastoid air cells are clear. IMPRESSION: No acute intracranial abnormality. No significant change since 03/27/2020. Electronically Signed   By: Macy Mis M.D.   On: 04/02/2020 14:12   CT CERVICAL SPINE WO CONTRAST  Result Date: 04/02/2020 CLINICAL DATA:  Upper extremity weakness EXAM: CT CERVICAL SPINE WITHOUT CONTRAST TECHNIQUE: Multidetector CT imaging of the cervical spine was performed without  intravenous contrast. Multiplanar CT image reconstructions were also generated. COMPARISON:  None. FINDINGS: Motion artifact is present. Findings below are within this limitation. Alignment: Mild retrolisthesis at C3-C4, C4-C5, and C7-T1 Skull base and vertebrae: Vertebral body heights are preserved apart from degenerative endplate irregularity. No destructive osseous lesion. Soft tissues and spinal canal: No prevertebral fluid or swelling. No visible canal hematoma. Disc levels: Multilevel degenerative changes are present including disc space narrowing, endplate osteophytes, and facet and uncovertebral hypertrophy. No definite high-grade encroachment on the spinal canal, which is mostly obscured by artifact. Foraminal stenosis is greatest on the left from C3-C4 to C5-C6. Upper chest: No apical lung mass. Other: Calcified plaque at the ICA origins. IMPRESSION: Motion degraded study. No definite high-grade spinal canal stenosis. Foraminal stenosis is greatest on the left from C3-C4 to C5-C6. Electronically Signed   By: Macy Mis M.D.   On: 04/02/2020 14:05    Medications:  I have reviewed the patient's current medications. Scheduled: . amLODipine  5 mg Oral Daily  . budesonide (PULMICORT) nebulizer solution  0.5 mg Nebulization BID  . chlorhexidine gluconate (MEDLINE KIT)  15 mL Mouth Rinse BID  . Chlorhexidine Gluconate Cloth  6 each Topical Daily  . clopidogrel  75 mg Oral Daily  . folic acid  1 mg Oral Daily  . heparin  5,000 Units Subcutaneous Q8H  . insulin aspart  0-20 Units Subcutaneous TID WC  . insulin glargine  10 Units Subcutaneous Daily  . ipratropium-albuterol  3 mL Nebulization Q4H  . multivitamin with minerals  1 tablet Oral Daily  . Oxcarbazepine  300 mg Oral QHS  . pantoprazole sodium  40 mg Oral Daily  . polyethylene glycol  17 g Oral Daily  . senna  1 tablet  Oral QHS  . sodium chloride flush  10-40 mL Intracatheter Q12H  . thiamine  100 mg Oral Daily   Or  . thiamine   100 mg Intravenous Daily    Assessment/Plan: 64 y.o.femalewith PMH of COPD, Seizure disorder, second-degree heart block s/p PPM placement, PVD s/p multiple stent placements (11/2019), h/o CVA, Emphysema, HTN, HLD,TBI with residual memory deficit, seizures, chronic hepatitis C, bipolar disorder, h/o substance abuse, PTSD and tobacco use whowas admitted with acute hypoxic respiratory failure secondary to CAP and COPD with acute exacerbation.Pt admitted 6/25 and intubated 6/29. Has been slow to recover. Head CT performed and shows no acute changes. Patienthadeye deviation prompting EEG which was only significant for slowing with triphasic waves consistent with a metabolic encephalopathy.  Now extubated.  UE weakness appreciated out of proportion to that in the LE's, left greater than right.  CT performed of the head and cervical spine with no abnormalities to explain findings noted.  Patient continues to improve.    Will continue to follow with you.     LOS: 14 days   Alexis Goodell, MD Neurology [phone] 04/03/2020  11:26 AM

## 2020-04-03 NOTE — Plan of Care (Signed)
Patient is alert to self and knows that the date is July 2021, confused to place and situation. Patient has denied pain this shift. Patient is thirsty and has called out for water this shift. Patient has consumed 600 ml of honey thickened water/tea this shift. Patient has been educated that she can only have honey thickened liquids and verbalizes understanding but quickly forgets and needs frequent reminding. Patient has a rectal tube is receiving 30GM of lactulose BID. Amiodarone drip infusing per order. Foley catheter draining clear yellow urine.
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
## 2020-04-03 NOTE — Consult Note (Signed)
[facility] for Electrolyte Monitoring and Replacement   Recent Labs: Potassium (mmol/L)  Date Value  04/03/2020 3.8   Magnesium (mg/dL)  Date Value  04/03/2020 2.3   Calcium (mg/dL)  Date Value  04/03/2020 8.6 (L)   Albumin (g/dL)  Date Value  04/01/2020 2.5 (L)  06/25/2019 4.5   Phosphorus (mg/dL)  Date Value  04/03/2020 3.3   Sodium (mmol/L)  Date Value  04/03/2020 144  09/17/2015 136   Corrected Ca: 9.8 mg/dL  Assessment: 64 y.o.femalewith PMH of COPD, Seizure disorder, second-degree heart block s/p PPM placement, PVD s/p multiple stent placements (11/2019), h/o CVA, Emphysema, HTN, HLD,TBI with residual memory deficit, seizures, chronic hepatitis C, bipolar disorder, h/o substance abuse, PTSD and tobacco use whowas admitted with acute hypoxic respiratory failure secondary to CAP and COPD with acute exacerbation. Pharmacy has been asked to evaluate daily for electrolye replacement.  Goal of Therapy:  Potassium 4.0 - 5.1 mmol/L Magnesium 2.0 - 2.4 mg/dL All Other Electrolytes WNL  Plan:   20 mEq oral KCl  Re-check electrolytes with am lab 7/10  Cynthia [name] ,PharmD Clinical Pharmacist 04/03/2020 7:02 AM

## 2020-04-03 NOTE — Progress Notes (Signed)
Pt has remained alert and oriented to everything except situation throughout shift. SB except with CPT then became tachy, CPT was dc'd. Pt complained of pain twice during shift. Gave prn tylenol and one time order of Toradol. Foley pulled and purewick in. Flexy seal still in place.  3 L [place]. Pt on dysphagia 1 diet now. Husband at bedside throughout shift, just left very helpful with her requests. Pt currently resting comfortably in bed watching tv. Will continue to monitor.

## 2020-04-03 NOTE — Progress Notes (Signed)
  Speech Language Pathology Treatment: Dysphagia  Patient Details Name: Cynthia [name] MRN: [id number] DOB: 12/17/1955 Today's Date: 04/03/2020 Time: 1330-1350 SLP Time Calculation (min) (ACUTE ONLY): 20 min  Assessment / Plan / Recommendation Clinical Impression  Skilled treatment session focused on trials of upgraded textures with pt. She continues to demonstrate overall improvement. SLP facilitated session by providing skilled observation of pt consuming dysphagia 2 trials with thin liquids via straw. Pt demonstrated functional oral phase and her swallow appeared swift. Pt has intermittent cough at baseline and was not increased during consumption of thin liquids. Moreover, pt consumed 3 oz consecutive sips via straw x 4 with no immediate s/s of aspiration. At this time, recommend upgrading pt to dysphagia 2 with thin liquids via straw. ST to continue following.    HPI HPI:  Chest x-ray done today revealed a stable cardiomediastinal silhouette however bilateral patchy pulmonary infiltrates has worsened.       SLP Plan  Continue with current plan of care       Recommendations  Diet recommendations: Dysphagia 2 (fine chop);Thin liquid Liquids provided via: Straw Medication Administration: Crushed with puree Supervision: Staff to assist with self feeding Compensations: Minimize environmental distractions;Slow rate;Small sips/bites Postural Changes and/or Swallow Maneuvers: Seated upright 90 degrees                Oral Care Recommendations: Oral care QID Follow up Recommendations: Skilled Nursing facility SLP Visit Diagnosis: Dysphagia, unspecified (R13.10) Plan: Continue with current plan of care       GO               Demetris Meinhardt B. Rutherford Nail M.S., CCC-SLP, [place] [facility] [phone]  Veora Fonte Rutherford Nail 04/03/2020, 2:50 PM

## 2020-04-03 NOTE — Progress Notes (Signed)
CPT caused pt HR to Rise from 59 to 114 and she began to have PVCs. Stopped tx.

## 2020-04-03 NOTE — Progress Notes (Signed)
[facility] Cardiology    SUBJECTIVE: Patient completely awake and alert still having trouble with her left side able to follow commands with the right side denies any pain no palpitations sitting up talking to her husband and answering questions  appropriately   Vitals:   04/03/20 0611 04/03/20 0700 04/03/20 0746 04/03/20 0800  BP: (!) 144/48 (!) 149/56    Pulse: (!) 55 (!) 55    Resp: 15 13    Temp:    98 F (36.7 C)  TempSrc:    Oral  SpO2: 97% 98% 96%   Weight:      Height:         Intake/Output Summary (Last 24 hours) at 04/03/2020 0830 Last data filed at 04/03/2020 0800 Gross per 24 hour  Intake 998.46 ml  Output 1550 ml  Net -551.54 ml      PHYSICAL EXAM  General: Well developed, well nourished, in no acute distress HEENT:  Normocephalic and atramatic Neck:  No JVD.  Lungs: Clear bilaterally to auscultation and percussion. Heart: HRRR . Normal S1 and S2 without gallops or murmurs.  Abdomen: Bowel sounds are positive, abdomen soft and non-tender  Msk:  Back normal, normal gait. Normal strength and tone for age. Extremities: No clubbing, cyanosis or edema.   Neuro: Alert and oriented X 3.  Left-sided hemiparesis Psych:  Good affect, responds appropriately   LABS: Basic Metabolic Panel: Recent Labs    04/02/20 0415 04/03/20 0456  NA 146* 144  K 4.4 3.8  CL 110 112*  CO2 27 28  GLUCOSE 129* 154*  BUN 36* 32*  CREATININE 0.95 0.87  CALCIUM 8.8* 8.6*  MG 2.3 2.3  PHOS 3.6 3.3   Liver Function Tests: Recent Labs    04/01/20 0414  AST 19  ALT 35  ALKPHOS 50  BILITOT 0.7  PROT 5.7*  ALBUMIN 2.5*   No results for input(s): LIPASE, AMYLASE in the last 72 hours. CBC: Recent Labs    04/02/20 0415 04/03/20 0456  WBC 7.3 6.4  NEUTROABS 6.0 5.1  HGB 10.5* 11.0*  HCT 33.4* 34.9*  MCV 96.8 96.7  PLT 187 202   Cardiac Enzymes: No results for input(s): CKTOTAL, CKMB, CKMBINDEX, TROPONINI in the last 72 hours. BNP: Invalid input(s): POCBNP D-Dimer: No  results for input(s): DDIMER in the last 72 hours. Hemoglobin A1C: No results for input(s): HGBA1C in the last 72 hours. Fasting Lipid Panel: No results for input(s): CHOL, HDL, LDLCALC, TRIG, CHOLHDL, LDLDIRECT in the last 72 hours. Thyroid Function Tests: No results for input(s): TSH, T4TOTAL, T3FREE, THYROIDAB in the last 72 hours.  Invalid input(s): FREET3 Anemia Panel: No results for input(s): VITAMINB12, FOLATE, FERRITIN, TIBC, IRON, RETICCTPCT in the last 72 hours.  CT HEAD WO CONTRAST  Result Date: 04/02/2020 CLINICAL DATA:  Upper extremity weakness, history of stroke EXAM: CT HEAD WITHOUT CONTRAST TECHNIQUE: Contiguous axial images were obtained from the base of the skull through the vertex without intravenous contrast. COMPARISON:  03/27/2020 FINDINGS: Brain: There is no acute intracranial hemorrhage, mass effect, or edema. No new loss of gray-white differentiation. There is no extra-axial fluid collection. Ventricles and sulci are stable in size and configuration. Patchy hypoattenuation in the supratentorial white matter is nonspecific but probably reflects stable chronic microvascular ischemic changes. Vascular: There is atherosclerotic calcification at the skull base. Skull: Calvarium is unremarkable. 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 No significant paranasal sinus opacification. No acute orbital finding. Other: Mastoid air cells  are clear. IMPRESSION: No acute intracranial abnormality. No significant change since 03/27/2020. Electronically Signed   By: Macy Mis M.D.   On: 04/02/2020 14:12   CT CERVICAL SPINE WO CONTRAST  Result Date: 04/02/2020 CLINICAL DATA:  Upper extremity weakness EXAM: CT CERVICAL SPINE WITHOUT CONTRAST TECHNIQUE: Multidetector CT imaging of the cervical spine was performed without intravenous contrast. Multiplanar CT image reconstructions were also generated. COMPARISON:  None. FINDINGS: Motion artifact is present.  Findings below are within this limitation. Alignment: Mild retrolisthesis at C3-C4, C4-C5, and C7-T1 Skull base and vertebrae: Vertebral body heights are preserved apart from degenerative endplate irregularity. No destructive osseous lesion. Soft tissues and spinal canal: No prevertebral fluid or swelling. No visible canal hematoma. Disc levels: Multilevel degenerative changes are present including disc space narrowing, endplate osteophytes, and facet and uncovertebral hypertrophy. No definite high-grade encroachment on the spinal canal, which is mostly obscured by artifact. Foraminal stenosis is greatest on the left from C3-C4 to C5-C6. Upper chest: No apical lung mass. Other: Calcified plaque at the ICA origins. IMPRESSION: Motion degraded study. No definite high-grade spinal canal stenosis. Foraminal stenosis is greatest on the left from C3-C4 to C5-C6. Electronically Signed   By: Macy Mis M.D.   On: 04/02/2020 14:05     Echo ejection fraction around 50%  TELEMETRY: Sinus bradycardia in the 50s occasional paced rhythm in the 50s:  ASSESSMENT AND PLAN:  Principal Problem:   CAP (community acquired pneumonia) Active Problems:   Essential hypertension, benign   Traumatic brain injury ([place])   Seizures ([place])   Bipolar affective disorder ([place])   Artificial cardiac pacemaker   COPD with acute exacerbation (HCC)   AKI (acute kidney injury) ([place])   Acute respiratory failure (HCC) Bradycardia sinus  Plan We will discontinue amiodarone IV Will recommend amiodarone 100 mg daily p.o. Continue conservative cardiac input Continue anticoagulation for DVT prophylaxis only Continue Plavix as we have done long-term Maintain adequate blood pressure control Agree with p.o. Protonix therapy Since the patient appears to be stable from a cardiac standpoint we will sign off now reconsult cardiology as needed  Yolonda Kida, MD 04/03/2020 8:30 AM

## 2020-04-04 DIAGNOSIS — R569 Unspecified convulsions: Secondary | ICD-10-CM | POA: Diagnosis not present

## 2020-04-04 LAB — CBC WITH DIFFERENTIAL/PLATELET
Abs Immature Granulocytes: 0.04 10*3/uL (ref 0.00–0.07)
Basophils Absolute: 0 10*3/uL (ref 0.0–0.1)
Basophils Relative: 0 %
Eosinophils Absolute: 0.1 10*3/uL (ref 0.0–0.5)
Eosinophils Relative: 1 %
HCT: 34.8 % — ABNORMAL LOW (ref 36.0–46.0)
Hemoglobin: 11 g/dL — ABNORMAL LOW (ref 12.0–15.0)
Immature Granulocytes: 1 %
Lymphocytes Relative: 15 %
Lymphs Abs: 1.1 10*3/uL (ref 0.7–4.0)
MCH: 30.8 pg (ref 26.0–34.0)
MCHC: 31.6 g/dL (ref 30.0–36.0)
MCV: 97.5 fL (ref 80.0–100.0)
Monocytes Absolute: 0.3 10*3/uL (ref 0.1–1.0)
Monocytes Relative: 5 %
Neutro Abs: 5.7 10*3/uL (ref 1.7–7.7)
Neutrophils Relative %: 78 %
Platelets: 221 10*3/uL (ref 150–400)
RBC: 3.57 MIL/uL — ABNORMAL LOW (ref 3.87–5.11)
RDW: 14.6 % (ref 11.5–15.5)
WBC: 7.3 10*3/uL (ref 4.0–10.5)
nRBC: 0 % (ref 0.0–0.2)

## 2020-04-04 LAB — PHOSPHORUS: Phosphorus: 4.3 mg/dL (ref 2.5–4.6)

## 2020-04-04 LAB — BASIC METABOLIC PANEL
Anion gap: 5 (ref 5–15)
BUN: 35 mg/dL — ABNORMAL HIGH (ref 8–23)
CO2: 27 mmol/L (ref 22–32)
Calcium: 8.5 mg/dL — ABNORMAL LOW (ref 8.9–10.3)
Chloride: 106 mmol/L (ref 98–111)
Creatinine, Ser: 1 mg/dL (ref 0.44–1.00)
GFR calc Af Amer: >60 mL/min (ref >60)
GFR calc non Af Amer: 59 mL/min — ABNORMAL LOW (ref >60)
Glucose, Bld: 114 mg/dL — ABNORMAL HIGH (ref 70–99)
Potassium: 4.4 mmol/L (ref 3.5–5.1)
Sodium: 138 mmol/L (ref 135–145)

## 2020-04-04 LAB — GLUCOSE, CAPILLARY
Glucose-Capillary: 127 mg/dL — ABNORMAL HIGH (ref 70–99)
Glucose-Capillary: 101 mg/dL — ABNORMAL HIGH (ref 70–99)
Glucose-Capillary: 147 mg/dL — ABNORMAL HIGH (ref 70–99)
Glucose-Capillary: 103 mg/dL — ABNORMAL HIGH (ref 70–99)

## 2020-04-04 LAB — MAGNESIUM: Magnesium: 2.1 mg/dL (ref 1.7–2.4)

## 2020-04-04 MED ORDER — ACETAMINOPHEN 500 MG PO TABS
500.0000 mg | ORAL_TABLET | Freq: Four times a day (QID) | ORAL | Status: DC | PRN
  Administered 2020-04-04 – 2020-04-07 (×6): 500 mg via ORAL
  Filled 2020-04-04 (×5): qty 1

## 2020-04-04 MED ORDER — ACETAMINOPHEN 500 MG PO TABS: 500.0000 mg | ORAL_TABLET | Freq: Four times a day (QID) | ORAL | Status: DC | PRN

## 2020-04-04 NOTE — Evaluation (Addendum)
Occupational Therapy Re-Evaluation Patient Details Name: Cynthia [name] MRN: [id number] DOB: September 29, 1955 Today's Date: 04/04/2020    History of Present Illness Cynthia [name] is an 64 y.o. female with PMH of COPD, Seizure disorder, second-degree heart block s/p PPM placement, PVD s/p multiple stent placements (11/2019), h/o CVA, Emphysema, HTN, HLD,TBI with residual memory deficit, seizures, chronic hepatitis C, bipolar disorder, h/o substance abuse, PTSD and tobacco use who was admitted with acute hypoxic respiratory failure secondary to CAP and COPD with acute exacerbation   Clinical Impression   Ms Heathcock was seen for OT evaluation this date. Prior to hospital admission, pt was MOD I for mobility using 4WW however reports numerous falls and assistance required for ADLs. Pt lives c husband who works next door and can be available t/o day as needed. Pt presents to acute OT demonstrating impaired ADL performance and functional mobility 2/2 decreased activity tolerance, impaired use of BUE, functional strength/ROM/balance deficits, and decreased command following. Pt currently requires MIN A self-feeding - pt requires assist for shoulder flexion to achieve last ~3 inches to bring spoon to mouth. MAX A x2 rolling bed level for toilet t/f. TOTAL A for LBD.  Of note, this date pt strength improving compared to PT re-evaluation yesterday. B grip 3+/5. Pt responds better to goal directed movement - able to move LUE ~5 inches across bedspread when asked to reach for L rail (2-/5 grossly). Brings R hand to mouth (elbow flexion, wrist flexion, pronation WFL), supination and shoulder flexion grossly 2-/5. Pt would benefit from skilled OT to address noted impairments and functional limitations (see below for any additional details) in order to maximize safety and independence while minimizing falls risk and caregiver burden. Upon hospital discharge, recommend STR to maximize pt safety and return to PLOF.     Follow  Up Recommendations  SNF    Equipment Recommendations   (TBD next venue of care )    Recommendations for Other Services       Precautions / Restrictions Precautions Precautions: Fall;Other (comment) (rectal tube) Restrictions Weight Bearing Restrictions: No      Mobility Bed Mobility Overal bed mobility: Needs Assistance Bed Mobility: Rolling Rolling: Max assist;+2 for physical assistance         General bed mobility comments: Pt achieves trunk elevation from HoB ~45* c B grip on OT - no physical assist required.   Transfers                 General transfer comment: not attempted    Balance                                           ADL either performed or assessed with clinical judgement   ADL Overall ADL's : Needs assistance/impaired                                       General ADL Comments: MIN A self-feeding - pt reaches empty hand to mouth, when feeding requires assist for shoulder flexion to achieve last ~3 inches to bring spoon to mouth. MAX A x2 rolling bed level for toilet t/f. TOTAL A for LBD.      Vision         Perception     Praxis      Pertinent Vitals/Pain Pain  Assessment: Faces Faces Pain Scale: Hurts little more Pain Location: headache  Pain Descriptors / Indicators: Headache Pain Intervention(s): Limited activity within patient's tolerance;Repositioned;Patient requesting pain meds-RN notified     Hand Dominance Right   Extremity/Trunk Assessment Upper Extremity Assessment Upper Extremity Assessment: RUE deficits/detail;LUE deficits/detail RUE Deficits / Details: 3+/5 grip. AROM elbow flexion WFL, shoulder flexion ~10*. Achieves 1st and 2nd digit isolated finger tapping. AROM supination/pronation WFL c increased time  LUE Deficits / Details: 3+/5 grip. Achieves 1st digit isolated finger tapping. Grossly 2-/5 shoulder abduction and elbow flexion   Lower Extremity Assessment Lower Extremity  Assessment: Defer to PT evaluation       Communication Communication Communication: No difficulties   Cognition Arousal/Alertness: Awake/alert Behavior During Therapy: WFL for tasks assessed/performed;Anxious Overall Cognitive Status: Within Functional Limits for tasks assessed                                 General Comments: AxO x4, repeating "thank you" "I love you" and "you're beautiful" to OT, RN, and MD in room    General Comments  Mild LUE edema noted to hand and forearm - husband reports improved this date    Exercises Exercises: Other exercises Other Exercises Other Exercises: Pt and caregiver educated re: OT role, DME recs, d/c recs, BUE HEP (ball squeezes, AAROM, finger isolated taps) Other Exercises: LBD, toileting, self-feeding, rolling   Shoulder Instructions      Home Living Family/patient expects to be discharged to:: Private residence Living Arrangements: Spouse/significant other Available Help at Discharge: Family;Available PRN/intermittently Type of Home: House Home Access: Stairs to enter [employer] of Steps: 3 (Pt seemed unclear, guessed 4-5 steps to porch) Entrance Stairs-Rails: Can reach both Home Layout: One level     Bathroom Shower/Tub: Teacher, early years/pre: Standard     Home Equipment: Environmental consultant - 2 wheels;Cane - single point;Grab bars - tub/shower          Prior Functioning/Environment Level of Independence: Needs assistance  Gait / Transfers Assistance Needed: Husband reports numerous falls in 2021 and states that she was indpenedent with transfers and ambulation however utilized a rollator occasionally for steadying.  ADL's / Homemaking Assistance Needed: Husband states that he assisted with ADLs and does all the cooking but pt is able to use the microwave for meals.            OT Problem List: Decreased strength;Decreased range of motion;Decreased activity tolerance;Impaired balance (sitting  and/or standing);Decreased safety awareness;Decreased knowledge of use of DME or AE;Cardiopulmonary status limiting activity;Decreased coordination;Impaired UE functional use      OT Treatment/Interventions: Self-care/ADL training;Therapeutic exercise;Energy conservation;DME and/or AE instruction;Therapeutic activities;Patient/family education;Balance training;Neuromuscular education;Cognitive remediation/compensation    OT Goals(Current goals can be found in the care plan section) Acute Rehab OT Goals Patient Stated Goal: go home OT Goal Formulation: With patient/family Time For Goal Achievement: 04/18/20 Potential to Achieve Goals: Fair ADL Goals Pt Will Perform Eating: with set-up;with supervision;bed level (c AE PRN) Pt Will Transfer to Toilet: with min assist (rolling at bed level) Pt/caregiver will Perform Home Exercise Program: Increased strength;Both right and left upper extremity;With minimal assist  OT Frequency: Min 2X/week   Barriers to D/C: Inaccessible home environment;Decreased caregiver support          Co-evaluation              AM-PAC OT "6 Clicks" Daily Activity     Outcome Measure Help  from another person eating meals?: A Little Help from another person taking care of personal grooming?: A Lot Help from another person toileting, which includes using toliet, bedpan, or urinal?: Total Help from another person bathing (including washing, rinsing, drying)?: Total Help from another person to put on and taking off regular upper body clothing?: A Lot Help from another person to put on and taking off regular lower body clothing?: Total 6 Click Score: 10   End of Session Nurse Communication: Mobility status  Activity Tolerance: Patient tolerated treatment well Patient left: in bed;with call bell/phone within reach;with bed alarm set  OT Visit Diagnosis: Unsteadiness on feet (R26.81);Repeated falls (R29.6)                Time: 3832-9191 OT Time Calculation  (min): 24 min Charges:  OT General Charges $OT Visit: 1 Visit OT Evaluation $OT Re-eval: 1 Re-eval OT Treatments $Therapeutic Activity: 8-22 mins  [name], M.S. OTR/L  04/04/20, 10:10 AM

## 2020-04-04 NOTE — Progress Notes (Signed)
Subjective: Continues to show slow improvement  Objective: Current vital signs: BP (!) 150/53 (BP Location: Left Arm)   Pulse (!) 55   Temp 98 F (36.7 C)   Resp 17   Ht '5\' 5"'  (1.651 m)   Wt 67.5 kg   SpO2 98%   BMI 24.76 kg/m  Vital signs in last 24 hours: Temp:  [97.1 F (36.2 C)-98 F (36.7 C)] 98 F (36.7 C) (07/10 0816) Pulse Rate:  [54-66] 55 (07/10 0816) Resp:  [12-21] 17 (07/10 0816) BP: (120-151)/(47-63) 150/53 (07/10 0816) SpO2:  [96 %-100 %] 98 % (07/10 0816) Weight:  [67.5 kg] 67.5 kg (07/10 0431)  Intake/Output from previous day: 07/09 0701 - 07/10 0700 In: 521.7 [P.O.:480; I.V.:41.7] Out: 200 [Urine:200] Intake/Output this shift: No intake/output data recorded. Nutritional status:  Diet Order            DIET DYS 2 Room service appropriate? Yes; Fluid consistency: Thin  Diet effective now                 Neurologic Exam: Mental Status: Alert, oriented, thought content appropriate. Speech fluent without evidence of aphasia. Mild dysarthria.Able to follow commands. Cranial Nerves: II: Visual fields grossly normal, pupils equal, round, reactive to light and accommodation III,IV, BU:LAGTXM-IWOEHO motions intact bilaterally V,VII: smile symmetric, facial light touch sensation normal bilaterally VIII: hearing normal bilaterally IX,X: gag reflex present XI: bilateral shoulder shrug XII: midline tongue extension Motor: Able to lift RUE off the bed an activate the deltoid.  Able to lift the LUE less so but today able to touch face  Continues to be able to lift the lower extremities.   Sensory: Pinprick and light touch intact throughout, bilaterally  Lab Results: Basic Metabolic Panel: Recent Labs  Lab 03/31/20 0503 03/31/20 0503 04/01/20 0414 04/01/20 0414 04/02/20 0415 04/03/20 0456 04/04/20 0638  NA 141  --  144  --  146* 144 138  K 3.2*  --  3.7  --  4.4 3.8 4.4  CL 101  --  106  --  110 112* 106  CO2 30  --  33*  --  '27 28 27   ' GLUCOSE 183*  --  124*  --  129* 154* 114*  BUN 49*  --  41*  --  36* 32* 35*  CREATININE 1.08*  --  0.90  --  0.95 0.87 1.00  CALCIUM 9.1   < > 8.7*   < > 8.8* 8.6* 8.5*  MG 2.2  --  2.2  --  2.3 2.3 2.1  PHOS 4.3  --  3.8  --  3.6 3.3 4.3   < > = values in this interval not displayed.    Liver Function Tests: Recent Labs  Lab 03/29/20 0426 03/30/20 0606 03/31/20 0503 04/01/20 0414  AST 19 32 25 19  ALT 35 49* 46* 35  ALKPHOS 65 63 57 50  BILITOT 0.7 0.8 0.8 0.7  PROT 6.8 6.7 6.3* 5.7*  ALBUMIN 2.7* 2.7* 2.7* 2.5*   No results for input(s): LIPASE, AMYLASE in the last 168 hours. Recent Labs  Lab 03/29/20 0430  AMMONIA 36*    CBC: Recent Labs  Lab 03/31/20 0503 04/01/20 0414 04/02/20 0415 04/03/20 0456 04/04/20 0638  WBC 14.9* 10.0 7.3 6.4 7.3  NEUTROABS 13.3* 8.6* 6.0 5.1 5.7  HGB 11.3* 10.0* 10.5* 11.0* 11.0*  HCT 36.6 32.2* 33.4* 34.9* 34.8*  MCV 97.3 98.5 96.8 96.7 97.5  PLT 176 153 187 202 221  Cardiac Enzymes: No results for input(s): CKTOTAL, CKMB, CKMBINDEX, TROPONINI in the last 168 hours.  Lipid Panel: No results for input(s): CHOL, TRIG, HDL, CHOLHDL, VLDL, LDLCALC in the last 168 hours.  CBG: Recent Labs  Lab 04/02/20 1614 04/03/20 0755 04/03/20 1140 04/03/20 1634 04/04/20 0815  GLUCAP 150* 130* 174* 102* 127*    Microbiology: Results for orders placed or performed during the hospital encounter of 03/20/20  SARS Coronavirus 2 by RT PCR (hospital order, performed in [facility] hospital lab) Nasopharyngeal Nasopharyngeal Swab     Status: None   Collection Time: 03/20/20  8:19 PM   Specimen: Nasopharyngeal Swab  Result Value Ref Range Status   SARS Coronavirus 2 NEGATIVE NEGATIVE Final    Comment: (NOTE) SARS-CoV-2 target nucleic acids are NOT DETECTED.  The SARS-CoV-2 RNA is generally detectable in upper and lower respiratory specimens during the acute phase of infection. The lowest concentration of SARS-CoV-2 viral copies this  assay can detect is 250 copies / mL. A negative result does not preclude SARS-CoV-2 infection and should not be used as the sole basis for treatment or other patient management decisions.  A negative result may occur with improper specimen collection / handling, submission of specimen other than nasopharyngeal swab, presence of viral mutation(s) within the areas targeted by this assay, and inadequate number of viral copies (<250 copies / mL). A negative result must be combined with clinical observations, patient history, and epidemiological information.  Fact Sheet for Patients:   StrictlyIdeas.no  Fact Sheet for Healthcare Providers: BankingDealers.co.za  This test is not yet approved or  cleared by the [place] FDA and has been authorized for detection and/or diagnosis of SARS-CoV-2 by FDA under an Emergency Use Authorization (EUA).  This EUA will remain in effect (meaning this test can be used) for the duration of the COVID-19 declaration under Section 564(b)(1) of the Act, 21 U.S.C. section 360bbb-3(b)(1), unless the authorization is terminated or revoked sooner.  Performed at [facility], [address] [place], [place] [address]   Blood Culture (routine x 2)     Status: None   Collection Time: 03/20/20  9:17 PM   Specimen: BLOOD  Result Value Ref Range Status   Specimen Description BLOOD LEFT FOREARM  Final   Special Requests   Final    BOTTLES DRAWN AEROBIC AND ANAEROBIC Blood Culture results may not be optimal due to an inadequate volume of blood received in culture bottles   Culture   Final    NO GROWTH 5 DAYS Performed at [facility], [address] [place], [place] [address]    Report Status 03/25/2020 FINAL  Final  Blood Culture (routine x 2)     Status: None   Collection Time: 03/20/20 10:11 PM   Specimen: BLOOD  Result Value Ref Range Status   Specimen Description BLOOD RIGHT FOREARM   Final   Special Requests   Final    BOTTLES DRAWN AEROBIC AND ANAEROBIC Blood Culture results may not be optimal due to an inadequate volume of blood received in culture bottles   Culture   Final    NO GROWTH 5 DAYS Performed at [facility], [address], [place], [place] [address]    Report Status 03/25/2020 FINAL  Final  MRSA PCR Screening     Status: None   Collection Time: 03/21/20  2:07 AM   Specimen: Nasal Mucosa; Nasopharyngeal  Result Value Ref Range Status   MRSA by PCR NEGATIVE NEGATIVE Final  Comment:        The GeneXpert MRSA Assay (FDA approved for NASAL specimens only), is one component of a comprehensive MRSA colonization surveillance program. It is not intended to diagnose MRSA infection nor to guide or monitor treatment for MRSA infections. Performed at [facility], [address] [place], [place] [address]   SARS Coronavirus 2 by RT PCR (hospital order, performed in [place] hospital lab)     Status: None   Collection Time: 03/23/20 10:12 AM  Result Value Ref Range Status   SARS Coronavirus 2 NEGATIVE NEGATIVE Final    Comment: (NOTE) SARS-CoV-2 target nucleic acids are NOT DETECTED.  The SARS-CoV-2 RNA is generally detectable in upper and lower respiratory specimens during the acute phase of infection. The lowest concentration of SARS-CoV-2 viral copies this assay can detect is 250 copies / mL. A negative result does not preclude SARS-CoV-2 infection and should not be used as the sole basis for treatment or other patient management decisions.  A negative result may occur with improper specimen collection / handling, submission of specimen other than nasopharyngeal swab, presence of viral mutation(s) within the areas targeted by this assay, and inadequate number of viral copies (<250 copies / mL). A negative result must be combined with clinical observations, patient history, and epidemiological information.  Fact Sheet for  Patients:   StrictlyIdeas.no  Fact Sheet for Healthcare Providers: BankingDealers.co.za  This test is not yet approved or  cleared by the [place] FDA and has been authorized for detection and/or diagnosis of SARS-CoV-2 by FDA under an Emergency Use Authorization (EUA).  This EUA will remain in effect (meaning this test can be used) for the duration of the COVID-19 declaration under Section 564(b)(1) of the Act, 21 U.S.C. section 360bbb-3(b)(1), unless the authorization is terminated or revoked sooner.  Performed at [facility], [address] [place], [place] [address]   Culture, respiratory (non-expectorated)     Status: None   Collection Time: 03/24/20  4:39 PM   Specimen: Tracheal Aspirate; Respiratory  Result Value Ref Range Status   Specimen Description   Final    TRACHEAL ASPIRATE Performed at [facility], [address], [place], [place] [address]    Special Requests   Final    NONE Performed at [facility], [address] [place], [place] [address]    Gram Stain   Final    RARE WBC PRESENT, PREDOMINANTLY PMN FEW SQUAMOUS EPITHELIAL CELLS PRESENT NO ORGANISMS SEEN    Culture   Final    NO GROWTH Performed at [facility], [address] [address], [place], [place] [address]    Report Status 03/25/2020 FINAL  Final  Expectorated sputum assessment w rflx to resp cult     Status: None   Collection Time: 03/25/20 11:21 AM   Specimen: Sputum  Result Value Ref Range Status   Specimen Description SPUTUM  Final   Special Requests Normal  Final   Sputum evaluation   Final    THIS SPECIMEN IS ACCEPTABLE FOR SPUTUM CULTURE Performed at [facility], [address] [place], [place] [address]    Report Status 03/25/2020 FINAL  Final  Culture, respiratory     Status: None   Collection Time: 03/25/20 11:21 AM   Specimen: SPU  Result Value Ref Range Status   Specimen Description    Final    SPUTUM Performed at [facility], [address], [place], [place] [address]    Special  Requests   Final    Normal Reflexed from [id number] Performed at [facility] [place], [place] [address]    Gram Stain NO WBC SEEN NO ORGANISMS SEEN   Final   Culture   Final    NO GROWTH 2 DAYS Performed at [facility], [place] [address] [place], [place] [address]    Report Status 03/28/2020 FINAL  Final    Coagulation Studies: No results for input(s): LABPROT, INR in the last 72 hours.  Imaging: CT HEAD WO CONTRAST  Result Date: 04/02/2020 CLINICAL DATA:  Upper extremity weakness, history of stroke EXAM: CT HEAD WITHOUT CONTRAST TECHNIQUE: Contiguous axial images were obtained from the base of the skull through the vertex without intravenous contrast. COMPARISON:  03/27/2020 FINDINGS: Brain: There is no acute intracranial hemorrhage, mass effect, or edema. No new loss of gray-white differentiation. There is no extra-axial fluid collection. Ventricles and sulci are stable in size and configuration. Patchy hypoattenuation in the supratentorial white matter is nonspecific but probably reflects stable chronic microvascular ischemic changes. Vascular: There is atherosclerotic calcification at the skull base. Skull: Calvarium is unremarkable. 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 No significant paranasal sinus opacification. No acute orbital finding. Other: Mastoid air cells are clear. IMPRESSION: No acute intracranial abnormality. No significant change since 03/27/2020. Electronically Signed   By: Macy Mis M.D.   On: 04/02/2020 14:12   CT CERVICAL SPINE WO CONTRAST  Result Date: 04/02/2020 CLINICAL DATA:  Upper extremity weakness EXAM: CT CERVICAL SPINE WITHOUT CONTRAST TECHNIQUE: Multidetector CT imaging of the cervical spine was performed without intravenous contrast. Multiplanar CT image reconstructions were  also generated. COMPARISON:  None. FINDINGS: Motion artifact is present. Findings below are within this limitation. Alignment: Mild retrolisthesis at C3-C4, C4-C5, and C7-T1 Skull base and vertebrae: Vertebral body heights are preserved apart from degenerative endplate irregularity. No destructive osseous lesion. Soft tissues and spinal canal: No prevertebral fluid or swelling. No visible canal hematoma. Disc levels: Multilevel degenerative changes are present including disc space narrowing, endplate osteophytes, and facet and uncovertebral hypertrophy. No definite high-grade encroachment on the spinal canal, which is mostly obscured by artifact. Foraminal stenosis is greatest on the left from C3-C4 to C5-C6. Upper chest: No apical lung mass. Other: Calcified plaque at the ICA origins. IMPRESSION: Motion degraded study. No definite high-grade spinal canal stenosis. Foraminal stenosis is greatest on the left from C3-C4 to C5-C6. Electronically Signed   By: Macy Mis M.D.   On: 04/02/2020 14:05    Medications:  I have reviewed the patient's current medications. Scheduled: . amLODipine  5 mg Oral Daily  . budesonide (PULMICORT) nebulizer solution  0.5 mg Nebulization BID  . chlorhexidine gluconate (MEDLINE KIT)  15 mL Mouth Rinse BID  . Chlorhexidine Gluconate Cloth  6 each Topical Daily  . clopidogrel  75 mg Oral Daily  . folic acid  1 mg Oral Daily  . heparin  5,000 Units Subcutaneous Q8H  . insulin aspart  0-20 Units Subcutaneous TID WC  . insulin glargine  10 Units Subcutaneous Daily  . ipratropium-albuterol  3 mL Nebulization Q4H  . multivitamin with minerals  1 tablet Oral Daily  . nicotine  21 mg Transdermal Daily  . Oxcarbazepine  300 mg Oral QHS  . pantoprazole sodium  40 mg Oral Daily  . polyethylene glycol  17 g Oral Daily  . senna  1 tablet Oral QHS  . sodium chloride flush  10-40 mL Intracatheter Q12H  . thiamine  100 mg Oral Daily   Or  . thiamine  100 mg Intravenous Daily     Assessment/Plan: 64 y.o.femalewith PMH of COPD, Seizure disorder, second-degree heart block s/p PPM placement, PVD s/p multiple stent placements (11/2019), h/o CVA, Emphysema, HTN, HLD,TBI with residual memory deficit, seizures, chronic hepatitis C, bipolar disorder, h/o substance abuse, PTSD and tobacco use whowas admitted with acute hypoxic respiratory failure secondary to CAP and COPD with acute exacerbation.Pt admitted 6/25 and intubated 6/29. Has been slow to recover. Head CT performed and shows no acute changes. Patienthadeye deviation prompting EEG which was only significant for slowing with triphasic waves consistent with a metabolic encephalopathy.  Now extubated. UE weakness appreciated out of proportion to that in the LE's, left greater than right. CT performed of the head and cervical spine with no abnormalities to explain findings noted.  Patient continues to improve.    Will continue to follow with you prn.      LOS: 15 days   Alexis Goodell, MD Neurology [phone] 04/04/2020  11:31 AM

## 2020-04-04 NOTE — Progress Notes (Signed)
Patient transported down to room 115.
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
## 2020-04-04 NOTE — Consult Note (Signed)
[facility] for Electrolyte Monitoring and Replacement   Recent Labs: Potassium (mmol/L)  Date Value  04/04/2020 4.4   Magnesium (mg/dL)  Date Value  04/04/2020 2.1   Calcium (mg/dL)  Date Value  04/04/2020 8.5 (L)   Albumin (g/dL)  Date Value  04/01/2020 2.5 (L)  06/25/2019 4.5   Phosphorus (mg/dL)  Date Value  04/04/2020 4.3   Sodium (mmol/L)  Date Value  04/04/2020 138  09/17/2015 136   Corrected Ca: 9.7 mg/dL  Assessment: 64 y.o.femalewith PMH of COPD, Seizure disorder, second-degree heart block s/p PPM placement, PVD s/p multiple stent placements (11/2019), h/o CVA, Emphysema, HTN, HLD,TBI with residual memory deficit, seizures, chronic hepatitis C, bipolar disorder, h/o substance abuse, PTSD and tobacco use whowas admitted with acute hypoxic respiratory failure secondary to CAP and COPD with acute exacerbation. Pharmacy has been asked to evaluate daily for electrolye replacement.  Goal of Therapy:  Potassium 4.0 - 5.1 mmol/L Magnesium 2.0 - 2.4 mg/dL All Other Electrolytes WNL  Plan:   No electrolyte replacement warranted today  Re-check electrolytes with am lab 7/11  Dallie Piles ,PharmD Clinical Pharmacist 04/04/2020 10:28 AM

## 2020-04-05 DIAGNOSIS — J189 Pneumonia, unspecified organism: Secondary | ICD-10-CM | POA: Diagnosis not present

## 2020-04-05 DIAGNOSIS — F3170 Bipolar disorder, currently in remission, most recent episode unspecified: Secondary | ICD-10-CM | POA: Diagnosis not present

## 2020-04-05 DIAGNOSIS — J9601 Acute respiratory failure with hypoxia: Secondary | ICD-10-CM | POA: Diagnosis not present

## 2020-04-05 LAB — BASIC METABOLIC PANEL
Anion gap: 7 (ref 5–15)
BUN: 27 mg/dL — ABNORMAL HIGH (ref 8–23)
CO2: 24 mmol/L (ref 22–32)
Calcium: 8.3 mg/dL — ABNORMAL LOW (ref 8.9–10.3)
Chloride: 109 mmol/L (ref 98–111)
Creatinine, Ser: 0.96 mg/dL (ref 0.44–1.00)
GFR calc Af Amer: >60 mL/min (ref >60)
GFR calc non Af Amer: >60 mL/min (ref >60)
Glucose, Bld: 110 mg/dL — ABNORMAL HIGH (ref 70–99)
Potassium: 4.2 mmol/L (ref 3.5–5.1)
Sodium: 140 mmol/L (ref 135–145)

## 2020-04-05 LAB — CBC WITH DIFFERENTIAL/PLATELET
Abs Immature Granulocytes: 0.05 10*3/uL (ref 0.00–0.07)
Basophils Absolute: 0 10*3/uL (ref 0.0–0.1)
Basophils Relative: 1 %
Eosinophils Absolute: 0 10*3/uL (ref 0.0–0.5)
Eosinophils Relative: 1 %
HCT: 36.5 % (ref 36.0–46.0)
Hemoglobin: 11.5 g/dL — ABNORMAL LOW (ref 12.0–15.0)
Immature Granulocytes: 1 %
Lymphocytes Relative: 17 %
Lymphs Abs: 0.9 10*3/uL (ref 0.7–4.0)
MCH: 30.5 pg (ref 26.0–34.0)
MCHC: 31.5 g/dL (ref 30.0–36.0)
MCV: 96.8 fL (ref 80.0–100.0)
Monocytes Absolute: 0.2 10*3/uL (ref 0.1–1.0)
Monocytes Relative: 4 %
Neutro Abs: 4 10*3/uL (ref 1.7–7.7)
Neutrophils Relative %: 76 %
Platelets: 187 10*3/uL (ref 150–400)
RBC: 3.77 MIL/uL — ABNORMAL LOW (ref 3.87–5.11)
RDW: 14.9 % (ref 11.5–15.5)
WBC: 5.2 10*3/uL (ref 4.0–10.5)
nRBC: 0 % (ref 0.0–0.2)

## 2020-04-05 LAB — GLUCOSE, CAPILLARY
Glucose-Capillary: 178 mg/dL — ABNORMAL HIGH (ref 70–99)
Glucose-Capillary: 124 mg/dL — ABNORMAL HIGH (ref 70–99)
Glucose-Capillary: 108 mg/dL — ABNORMAL HIGH (ref 70–99)

## 2020-04-05 LAB — PHOSPHORUS: Phosphorus: 3.6 mg/dL (ref 2.5–4.6)

## 2020-04-05 LAB — MAGNESIUM: Magnesium: 2.1 mg/dL (ref 1.7–2.4)

## 2020-04-05 MED ORDER — NYSTATIN 100000 UNIT/ML MT SUSP
5.0000 mL | Freq: Four times a day (QID) | OROMUCOSAL | Status: DC
  Administered 2020-04-05 – 2020-04-07 (×7): 500000 [IU] via ORAL
  Filled 2020-04-05 (×9): qty 5

## 2020-04-05 NOTE — Progress Notes (Signed)
VAST consulted to start PIV and then d/c right TL PICC. Assessed patient for PIV. Very poor peripheral veins.No IVF's or IV med ordered. Notified Dr. Manuella Ghazi and requested to leave PICC in place for lab draws until patient discharged. MD order received to cancel removal of PICC line.
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
## 2020-04-05 NOTE — Progress Notes (Addendum)
1        [facility] NAME: Cynthia [name]    MR#:  [id number]  DATE OF BIRTH:  May 31, 1956  SUBJECTIVE:  CHIEF COMPLAINT:   Chief Complaint  Patient presents with  . Abdominal Pain  No new complaints REVIEW OF SYSTEMS:  Review of Systems  Constitutional: Positive for malaise/fatigue. Negative for diaphoresis, fever and weight loss.  HENT: Negative for ear discharge, ear pain, hearing loss, nosebleeds, sore throat and tinnitus.   Eyes: Negative for blurred vision and pain.  Respiratory: Negative for cough, hemoptysis, shortness of breath and wheezing.   Cardiovascular: Negative for chest pain, palpitations, orthopnea and leg swelling.  Gastrointestinal: Negative for abdominal pain, blood in stool, constipation, diarrhea, heartburn, nausea and vomiting.  Genitourinary: Negative for dysuria, frequency and urgency.  Musculoskeletal: Negative for back pain and myalgias.  Skin: Negative for itching and rash.  Neurological: Negative for dizziness, tingling, tremors, focal weakness, seizures, weakness and headaches.  Psychiatric/Behavioral: Negative for depression. The patient is not nervous/anxious.    DRUG ALLERGIES:   Allergies  Allergen Reactions  . Morphine Anaphylaxis    Cardiac arrest   VITALS:  Blood pressure (!) 140/45, pulse (!) 56, temperature 98.1 F (36.7 C), resp. rate 18, height 5\' 5"  (1.651 m), weight 66.8 kg, SpO2 96 %. PHYSICAL EXAMINATION:  Physical Exam HENT:     Head: Normocephalic and atraumatic.  Eyes:     Conjunctiva/sclera: Conjunctivae normal.     Pupils: Pupils are equal, round, and reactive to light.  Neck:     Thyroid: No thyromegaly.     Trachea: No tracheal deviation.  Cardiovascular:     Rate and Rhythm: Normal rate and regular rhythm.     Heart sounds: Normal heart sounds.  Pulmonary:     Effort: Pulmonary effort is normal. No respiratory distress.     Breath sounds: Normal breath sounds. No wheezing.    Chest:     Chest wall: No tenderness.  Abdominal:     General: Bowel sounds are normal. There is no distension.     Palpations: Abdomen is soft.     Tenderness: There is no abdominal tenderness.  Musculoskeletal:        General: Normal range of motion.     Cervical back: Normal range of motion and neck supple.  Skin:    General: Skin is warm and dry.     Findings: No rash.  Neurological:     Mental Status: She is alert and oriented to person, place, and time.     Cranial Nerves: No cranial nerve deficit.    LABORATORY PANEL:  Female CBC Recent Labs  Lab 04/05/20 0610  WBC 5.2  HGB 11.5*  HCT 36.5  PLT 187   ------------------------------------------------------------------------------------------------------------------ Chemistries  Recent Labs  Lab 04/01/20 0414 04/02/20 0415 04/05/20 0610  NA 144   < > 140  K 3.7   < > 4.2  CL 106   < > 109  CO2 33*   < > 24  GLUCOSE 124*   < > 110*  BUN 41*   < > 27*  CREATININE 0.90   < > 0.96  CALCIUM 8.7*   < > 8.3*  MG 2.2   < > 2.1  AST 19  --   --   ALT 35  --   --   ALKPHOS 50  --   --   BILITOT 0.7  --   --    < > =  values in this interval not displayed.   RADIOLOGY:  No results found. ASSESSMENT AND PLAN:  64 year old female with a known history of COPD, seizure disorder, second-degree heart block status post permanent pacemaker placement, PVD status post multiple stent placement, history of CVA, emphysema, hypertension, hyperlipidemia, TBI with residual memory deficit,'s chronic hepatitis C, bipolar disorder, history of substance abuse, PTSD, tobacco abuse admitted for acute hypoxic respiratory failure  Acute hypoxic respiratory failure -present on admission, now resolved Secondary to community-acquired pneumonia and COPD exacerbation Originally admitted to [facility] floor on 6/25.  Due to worsening respiratory status and A. fib with RVR she was transferred to stepdown on 6/26 -She failed BiPAP and high flow nasal  cannula and transferred to ICU status on 6/28.  Required intubation on 6/29, extubated on 7/7.  She was transferred to medical floor on 7/10 and is weaned off to room air now -Appreciate PCCM assistance -S/p bronchoscopy while in ICU -cultures negative to date -Completed full course of antibiotics  Seizure disorder -Followed by neurology, EEG negative -Continue Trileptal.,  As needed Ativan - Seizure precaution  Upper extremity weakness out of proportion lower extremity left greater than right All neuro work-up till now has been negative Appreciate neuro input PT recommends SNF  Paroxysmal A. Fib Rate controlled, atrial paced Cardiology does not recommend anticoagulation  Moderate protein calorie malnutrition Body mass index is 24.51 kg/m.  CKD stage II: At baseline  Has a PICC line in place for last 13 days.  I have asked the nurse to remove the PICC line and place a regular IV.      Status is: Inpatient  Remains inpatient appropriate because:Unsafe d/c plan   Dispo: The patient is from: Home              Anticipated d/c is to: SNF              Anticipated d/c date is: 2 days              Patient currently is not medically stable to d/c.  Waiting for SNF placement and insurance authorization    DVT prophylaxis:            heparin injection 5,000 Units Start: 03/21/20 0500 SCDs Start: 03/21/20 0047     Family Communication: Discussed with patient   All the records are reviewed and case discussed with Care Management/Social Worker. Management plans discussed with the patient, nursing and they are in agreement.  CODE STATUS: Full Code  TOTAL TIME TAKING CARE OF THIS PATIENT: 35 minutes.   More than 50% of the time was spent in counseling/coordination of care: YES  POSSIBLE D/C IN 1-2 DAYS, DEPENDING ON CLINICAL CONDITION.   Max Sane M.D on 04/05/2020 at 2:09 PM  [facility]   CC: Primary care physician; Delsa Grana, PA-C  Note: This dictation  was prepared with Dragon dictation along with smaller phrase technology. Any transcriptional errors that result from this process are unintentional.
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
## 2020-04-05 NOTE — Consult Note (Addendum)
[facility] for Electrolyte Monitoring and Replacement   Recent Labs: Potassium (mmol/L)  Date Value  04/05/2020 4.2   Magnesium (mg/dL)  Date Value  04/05/2020 2.1   Calcium (mg/dL)  Date Value  04/05/2020 8.3 (L)   Albumin (g/dL)  Date Value  04/01/2020 2.5 (L)  06/25/2019 4.5   Phosphorus (mg/dL)  Date Value  04/05/2020 3.6   Sodium (mmol/L)  Date Value  04/05/2020 140  09/17/2015 136   Corrected Ca: 9.7 mg/dL  Assessment: 64 y.o.femalewith PMH of COPD, Seizure disorder, second-degree heart block s/p PPM placement, PVD s/p multiple stent placements (11/2019), h/o CVA, Emphysema, HTN, HLD,TBI with residual memory deficit, seizures, chronic hepatitis C, bipolar disorder, h/o substance abuse, PTSD and tobacco use whowas admitted with acute hypoxic respiratory failure secondary to CAP and COPD with acute exacerbation. Pharmacy has been asked to evaluate daily for electrolye replacement.  Goal of Therapy:  Potassium 4.0 - 5.1 mmol/L Magnesium 2.0 - 2.4 mg/dL All Other Electrolytes WNL  Plan:   No electrolyte replacement warranted today  No replacement needed >48 hours. Pharmacy will sign off at this time.   Pernell Dupre, PharmD, BCPS Clinical Pharmacist 04/05/2020 8:52 AM

## 2020-04-06 DIAGNOSIS — F3170 Bipolar disorder, currently in remission, most recent episode unspecified: Secondary | ICD-10-CM | POA: Diagnosis not present

## 2020-04-06 DIAGNOSIS — J189 Pneumonia, unspecified organism: Secondary | ICD-10-CM | POA: Diagnosis not present

## 2020-04-06 DIAGNOSIS — J9601 Acute respiratory failure with hypoxia: Secondary | ICD-10-CM | POA: Diagnosis not present

## 2020-04-06 LAB — GLUCOSE, CAPILLARY
Glucose-Capillary: 115 mg/dL — ABNORMAL HIGH (ref 70–99)
Glucose-Capillary: 110 mg/dL — ABNORMAL HIGH (ref 70–99)
Glucose-Capillary: 105 mg/dL — ABNORMAL HIGH (ref 70–99)
Glucose-Capillary: 144 mg/dL — ABNORMAL HIGH (ref 70–99)

## 2020-04-06 LAB — CBC WITH DIFFERENTIAL/PLATELET
Abs Immature Granulocytes: 0.04 10*3/uL (ref 0.00–0.07)
Basophils Absolute: 0 10*3/uL (ref 0.0–0.1)
Basophils Relative: 1 %
Eosinophils Absolute: 0 10*3/uL (ref 0.0–0.5)
Eosinophils Relative: 1 %
HCT: 33.9 % — ABNORMAL LOW (ref 36.0–46.0)
Hemoglobin: 10.8 g/dL — ABNORMAL LOW (ref 12.0–15.0)
Immature Granulocytes: 1 %
Lymphocytes Relative: 19 %
Lymphs Abs: 0.9 10*3/uL (ref 0.7–4.0)
MCH: 30.4 pg (ref 26.0–34.0)
MCHC: 31.9 g/dL (ref 30.0–36.0)
MCV: 95.5 fL (ref 80.0–100.0)
Monocytes Absolute: 0.2 10*3/uL (ref 0.1–1.0)
Monocytes Relative: 4 %
Neutro Abs: 3.8 10*3/uL (ref 1.7–7.7)
Neutrophils Relative %: 74 %
Platelets: 184 10*3/uL (ref 150–400)
RBC: 3.55 MIL/uL — ABNORMAL LOW (ref 3.87–5.11)
RDW: 14.9 % (ref 11.5–15.5)
WBC: 5 10*3/uL (ref 4.0–10.5)
nRBC: 0 % (ref 0.0–0.2)

## 2020-04-06 LAB — BASIC METABOLIC PANEL
Anion gap: 6 (ref 5–15)
BUN: 20 mg/dL (ref 8–23)
CO2: 23 mmol/L (ref 22–32)
Calcium: 8.4 mg/dL — ABNORMAL LOW (ref 8.9–10.3)
Chloride: 112 mmol/L — ABNORMAL HIGH (ref 98–111)
Creatinine, Ser: 0.9 mg/dL (ref 0.44–1.00)
GFR calc Af Amer: >60 mL/min (ref >60)
GFR calc non Af Amer: >60 mL/min (ref >60)
Glucose, Bld: 122 mg/dL — ABNORMAL HIGH (ref 70–99)
Potassium: 4.1 mmol/L (ref 3.5–5.1)
Sodium: 141 mmol/L (ref 135–145)

## 2020-04-06 LAB — SARS CORONAVIRUS 2 BY RT PCR (HOSPITAL ORDER, PERFORMED IN ~~LOC~~ HOSPITAL LAB): SARS Coronavirus 2: NEGATIVE

## 2020-04-06 LAB — MAGNESIUM: Magnesium: 2.1 mg/dL (ref 1.7–2.4)

## 2020-04-06 LAB — PHOSPHORUS: Phosphorus: 3.5 mg/dL (ref 2.5–4.6)

## 2020-04-06 MED ORDER — LOSARTAN POTASSIUM 50 MG PO TABS
50.0000 mg | ORAL_TABLET | Freq: Every day | ORAL | Status: DC
  Administered 2020-04-06: 13:00:00 50 mg via ORAL
  Filled 2020-04-06: qty 1

## 2020-04-06 MED ORDER — IPRATROPIUM-ALBUTEROL 0.5-2.5 (3) MG/3ML IN SOLN
3.0000 mL | Freq: Three times a day (TID) | RESPIRATORY_TRACT | Status: DC
  Administered 2020-04-06 – 2020-04-07 (×5): 3 mL via RESPIRATORY_TRACT
  Filled 2020-04-06 (×5): qty 3

## 2020-04-06 MED ORDER — ENSURE ENLIVE PO LIQD
237.0000 mL | Freq: Two times a day (BID) | ORAL | Status: DC
  Administered 2020-04-06 – 2020-04-07 (×2): 237 mL via ORAL

## 2020-04-06 MED ORDER — AMANTADINE HCL 100 MG PO CAPS
100.0000 mg | ORAL_CAPSULE | Freq: Every day | ORAL | Status: DC
  Administered 2020-04-06 – 2020-04-07 (×2): 100 mg via ORAL
  Filled 2020-04-06 (×2): qty 1

## 2020-04-06 MED ORDER — ATORVASTATIN CALCIUM 20 MG PO TABS
20.0000 mg | ORAL_TABLET | Freq: Every evening | ORAL | Status: DC
  Filled 2020-04-06: qty 1

## 2020-04-06 MED ORDER — ONDANSETRON 4 MG PO TBDP
4.0000 mg | ORAL_TABLET | Freq: Three times a day (TID) | ORAL | Status: DC | PRN
  Administered 2020-04-06 – 2020-04-07 (×2): 4 mg via ORAL
  Filled 2020-04-06 (×4): qty 1

## 2020-04-06 MED ORDER — LITHIUM CARBONATE 300 MG PO CAPS
300.0000 mg | ORAL_CAPSULE | Freq: Two times a day (BID) | ORAL | Status: DC
  Administered 2020-04-07: 09:00:00 300 mg via ORAL
  Filled 2020-04-06 (×3): qty 1

## 2020-04-06 MED ORDER — IPRATROPIUM-ALBUTEROL 0.5-2.5 (3) MG/3ML IN SOLN: 3.0000 mL | RESPIRATORY_TRACT | Status: DC | PRN

## 2020-04-06 MED ORDER — QUETIAPINE FUMARATE 25 MG PO TABS
50.0000 mg | ORAL_TABLET | Freq: Two times a day (BID) | ORAL | Status: DC
  Administered 2020-04-06 (×2): 50 mg via ORAL
  Filled 2020-04-06 (×2): qty 2
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
## 2020-04-06 NOTE — Progress Notes (Addendum)
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 Completed antibiotic course. Now stable and waiting for placement.  Subjective: Patient has no new complaints today. She was watching TV and seems comfortable.  Assessment & Plan:   Principal Problem:   CAP (community acquired pneumonia) Active Problems:   Essential hypertension, benign   Traumatic brain injury ([place])   Seizures ([place])   Bipolar affective disorder ([place])   Artificial cardiac pacemaker   COPD with acute exacerbation ([place])   AKI (acute kidney injury) ([place])   Acute respiratory failure ([place])  Acute hypoxic respiratory failure. Patient presented with acute hypoxic respiratory failure secondary to pneumonia and COPD exacerbation. Initially admitted to [facility] on 03/20/2020.Due to worsening respiratory status and A. fib with RVR she was transferred to stepdown on 6/26 -She failed BiPAP and high flow nasal cannula and transferred to ICU status on 6/28.  Required intubation on 6/29, extubated on 7/7.  She was transferred to medical floor on 7/10 and is weaned off to room air now. Completed the course of antibiotics. Cultures remain negative.  Seizure disorder -Followed by neurology, EEG negative -Continue Trileptal.,  As needed Ativan - Seizure precaution.  Generalized weakness. Stroke work-up was done with concern of upper extremity weakness out of her portion than lower extremity. All work-up was negative. PT  is recommending SNF placement-waiting for bed and insurance authorization.  Paroxysmal A. Fib Rate controlled, atrial paced Cardiology does not recommend anticoagulation  Moderate protein calorie malnutrition Body mass index is 24.51 kg/m.  CKD stage II:  Creatinine at baseline. -Continue to monitor. -Avoid nephrotoxins.  Diabetes. -SSI.  Hypertension. Blood pressure within goal. -Continue home antihypertensives.  History of bipolar disorder. -Restart home dose of Seroquel and lithium.  PICC line. Patient has a PICC line for the past 2 weeks. It is being kept for lab draw once as she is very hard stick.  Objective: Vitals:   04/06/20 0437 04/06/20 0742 04/06/20 1253 04/06/20 1551  BP: (!) 165/89 (!) 144/53 (!) 144/56 (!) 96/39  Pulse: (!) 57 (!) 55 (!) 57 (!) 55  Resp: _0 Temp: 98.1 F (36.7 C) 98.2 F (36.8 C) 98 F (36.7 C) 97.9 F (36.6 C)  TempSrc: Oral  Oral Oral  SpO2: 97% 99% 100% 96%  Weight:      Height:        Intake/Output Summary (Last 24 hours) at 04/06/2020 1853 Last data filed at 04/06/2020 0442 Gross per 24 hour  Intake --  Output 350 ml  Net -350 ml   Filed Weights   04/04/20 0431 04/05/20 0500 04/06/20 0434  Weight: 67.5 kg 66.8 kg 66.7 kg    Examination:  General exam: Frail lady, appears calm and comfortable  Respiratory system: Clear to auscultation. Respiratory effort normal. Cardiovascular system: S1 & S2 heard, RRR. No JVD, murmurs, Gastrointestinal system: Soft, nontender, nondistended, bowel sounds positive. Central nervous system: Alert and oriented. No focal neurological deficits Extremities: No edema, no cyanosis, pulses intact and symmetrical. Skin: No rashes, lesions or ulcers Psychiatry: Judgement and insight appear normal.  DVT prophylaxis: Heparin Code Status: Full Family Communication: Son was updated on phone. Disposition Plan:  Status is: Inpatient  Remains inpatient appropriate because:Inpatient  level of care appropriate due to severity of illness   Dispo: The patient is from: Home              Anticipated d/c is to: SNF              Anticipated d/c date is: 1 day              Patient currently is medically stable to d/c.   Consultants:   PCCM  Neurology  Procedures:  Antimicrobials:   Data Reviewed: I have personally reviewed following labs and imaging studies  CBC: Recent Labs  Lab 04/02/20 0415 04/03/20 0456 04/04/20 0638 04/05/20 0610 04/06/20 0530  WBC 7.3 6.4 7.3 5.2 5.0  NEUTROABS 6.0 5.1 5.7 4.0 3.8  HGB 10.5* 11.0* 11.0* 11.5* 10.8*  HCT 33.4* 34.9* 34.8* 36.5 33.9*  MCV 96.8 96.7 97.5 96.8 95.5  PLT 187 202 221 187 638   Basic Metabolic Panel: Recent Labs  Lab 04/02/20 0415 04/03/20 0456 04/04/20 0638 04/05/20 0610 04/06/20 0530  NA 146* 144 138 140 141  K 4.4 3.8 4.4 4.2 4.1  CL 110 112* 106 109 112*  CO2 _0 GLUCOSE 129* 154* 114* 110* 122*  BUN 36* 32* 35* 27* 20  CREATININE 0.95 0.87 1.00 0.96 0.90  CALCIUM 8.8* 8.6* 8.5* 8.3* 8.4*  MG 2.3 2.3 2.1 2.1 2.1  PHOS 3.6 3.3 4.3 3.6 3.5   GFR: Estimated Creatinine Clearance: 56.1 mL/min (by C-G formula based on SCr of 0.9 mg/dL). Liver Function Tests: Recent Labs  Lab 03/31/20 0503 04/01/20 0414  AST 25 19  ALT 46* 35  ALKPHOS 57 50  BILITOT 0.8 0.7  PROT 6.3* 5.7*  ALBUMIN 2.7* 2.5*   No results for input(s): LIPASE, AMYLASE in the last 168 hours. No results for input(s): AMMONIA in the last 168 hours. Coagulation Profile: No results for input(s): INR, PROTIME in the last 168 hours. Cardiac Enzymes: No results for input(s): CKTOTAL, CKMB, CKMBINDEX, TROPONINI in the last 168 hours. BNP (last 3 results) No results for input(s): PROBNP in the last 8760 hours. HbA1C: No results for input(s): HGBA1C in the last 72 hours. CBG: Recent Labs  Lab 04/05/20 1252 04/05/20 1619 04/06/20 0742 04/06/20 1131 04/06/20 1647  GLUCAP 124* 108* 115* 110* 105*   Lipid  Profile: No results for input(s): CHOL, HDL, LDLCALC, TRIG, CHOLHDL, LDLDIRECT in the last 72 hours. Thyroid Function Tests: No results for input(s): TSH, T4TOTAL, FREET4, T3FREE, THYROIDAB in the last 72 hours. Anemia Panel: No results for input(s): VITAMINB12, FOLATE, FERRITIN, TIBC, IRON, RETICCTPCT in the last 72 hours. Sepsis Labs: No results for input(s): PROCALCITON, LATICACIDVEN in the last 168 hours.  Recent Results (from the past 240 hour(s))  SARS Coronavirus 2 by RT PCR (hospital order, performed in [facility] hospital lab) Nasopharyngeal Nasopharyngeal Swab     Status: None   Collection Time: 04/06/20  1:00 PM   Specimen: Nasopharyngeal Swab  Result Value Ref Range Status   SARS Coronavirus 2 NEGATIVE NEGATIVE Final    Comment: (NOTE) SARS-CoV-2 target nucleic acids are NOT DETECTED.  The SARS-CoV-2 RNA is generally detectable in upper and lower respiratory specimens during the acute phase of infection. The lowest concentration of SARS-CoV-2 viral copies this assay can detect is 250 copies / mL. A negative result does not preclude  SARS-CoV-2 infection and should not be used as the sole basis for treatment or other patient management decisions.  A negative result may occur with improper specimen collection / handling, submission of specimen other than nasopharyngeal swab, presence of viral mutation(s) within the areas targeted by this assay, and inadequate number of viral copies (<250 copies / mL). A negative result must be combined with clinical observations, patient history, and epidemiological information.  Fact Sheet for Patients:   StrictlyIdeas.no  Fact Sheet for Healthcare Providers: BankingDealers.co.za  This test is not yet approved or  cleared by the [place] FDA and has been authorized for detection and/or diagnosis of SARS-CoV-2 by FDA under an Emergency Use Authorization (EUA).  This EUA will remain in  effect (meaning this test can be used) for the duration of the COVID-19 declaration under Section 564(b)(1) of the Act, 21 U.S.C. section 360bbb-3(b)(1), unless the authorization is terminated or revoked sooner.  Performed at [facility], [address], [place], [place] [address]      Radiology Studies: No results found.  Scheduled Meds: . amantadine  100 mg Oral Daily  . amLODipine  5 mg Oral Daily  . atorvastatin  20 mg Oral QPM  . budesonide (PULMICORT) nebulizer solution  0.5 mg Nebulization BID  . chlorhexidine gluconate (MEDLINE KIT)  15 mL Mouth Rinse BID  . Chlorhexidine Gluconate Cloth  6 each Topical Daily  . clopidogrel  75 mg Oral Daily  . feeding supplement (ENSURE ENLIVE)  237 mL Oral BID BM  . folic acid  1 mg Oral Daily  . heparin  5,000 Units Subcutaneous Q8H  . insulin aspart  0-20 Units Subcutaneous TID WC  . insulin glargine  10 Units Subcutaneous Daily  . ipratropium-albuterol  3 mL Nebulization TID  . lithium carbonate  300 mg Oral BID WC  . losartan  50 mg Oral Daily  . multivitamin with minerals  1 tablet Oral Daily  . nicotine  21 mg Transdermal Daily  . nystatin  5 mL Oral QID  . Oxcarbazepine  300 mg Oral QHS  . pantoprazole sodium  40 mg Oral Daily  . polyethylene glycol  17 g Oral Daily  . QUEtiapine  50 mg Oral BID  . senna  1 tablet Oral QHS  . sodium chloride flush  10-40 mL Intracatheter Q12H  . thiamine  100 mg Oral Daily   Or  . thiamine  100 mg Intravenous Daily   Continuous Infusions:   LOS: 17 days   Time spent: 40 minutes. I personally reviewed her chart, labs and images.  Lorella Nimrod, MD [facility]  If 7PM-7AM, please contact night-coverage [email or web address]  04/06/2020, 6:53 PM   This record has been created using Dragon voice recognition software. Errors have been sought and corrected,but may not always be located. Such creation errors do not reflect on the standard of care.

## 2020-04-06 NOTE — TOC Progression Note (Signed)
Transition of Care [facility]) - Progression Note    Patient Details  Name: Patra Gherardi MRN: [id number] Date of Birth: August 06, 1956  Transition of Care [facility] CM/SW Contact  Shelbie Hutching, RN Phone Number: 04/06/2020, 2:19 PM  Clinical Narrative:    This RNCM introduced self to patient's husband, who is currently at the bedside.  Patient's husband, [name] voices that he would like for the patient to go home with home health services and that he would like to use [facility].  Referral given to Jana Half, waiting to hear if she is able to accept referral for RN, PT, OT, aide and SW.  Patient has a walker and wheelchair at home but does need a 3 in 1.    Expected Discharge Plan: [place] Barriers to Discharge: Continued Medical Work up  Expected Discharge Plan and Services Expected Discharge Plan: [place] In-house Referral: Clinical Social Work Discharge Planning Services: CM Consult Post Acute Care Choice: [place] arrangements for the past 2 months: Single Family Home                           HH Arranged: RN, PT, OT, Nurse's Aide, Social Work [facility] Agency: [facility] Date [place]: 04/06/20 Time [place]: [id number] Representative spoke with at [place]: [place] ([place]) Interventions    Readmission Risk Interventions No flowsheet data found.

## 2020-04-06 NOTE — Progress Notes (Signed)
Physical Therapy Treatment Patient Details Name: Cynthia [name] MRN: [id number] DOB: 11/11/1955 Today's Date: 04/06/2020    History of Present Illness Cynthia [name] is an 64 y.o. female with PMH of COPD, Seizure disorder, second-degree heart block s/p PPM placement, PVD s/p multiple stent placements (11/2019), h/o CVA, Emphysema, HTN, HLD,TBI with residual memory deficit, seizures, chronic hepatitis C, bipolar disorder, h/o substance abuse, PTSD and tobacco use who was admitted with acute hypoxic respiratory failure secondary to CAP and COPD with acute exacerbation    PT Comments    Pt in bed with HOB elevated, awake and alert. Pt with LE protective booties donned in bed. Pt reporting headache therefore deferred bed mobility and sitting as activities were performed to pt's tolerance. Pt with improved LUE movement with increased grip strength and active shoulder elevation noted and 2-/5 L elbow flexion. Pt performed therex in bed to promote BLE strengthening for progression towards improved functional mobility. Noted increased quality of BLE movement this session. Slight perseveration noted regarding pt not feeling well and will state "please help me" occasionally during session. Pt with labored breathing during and post exercise however vitals were stable at 91% SpO2 and 56 HR. Will continue to progress pt as tolerated to maximize functional mobility and optimize return to PLOF.   Follow Up Recommendations  SNF     Equipment Recommendations  None recommended by PT    Recommendations for Other Services       Precautions / Restrictions Precautions Precautions: Fall Restrictions Weight Bearing Restrictions: No    Mobility  Bed Mobility Overal bed mobility: Needs Assistance             General bed mobility comments: not performed secondary to reports of increased headache  Transfers                 General transfer comment: not attempted  Ambulation/Gait                  Stairs             Wheelchair Mobility    Modified Rankin (Stroke Patients Only)       Balance                                            Cognition Arousal/Alertness: Awake/alert Behavior During Therapy: WFL for tasks assessed/performed Overall Cognitive Status: No family/caregiver present to determine baseline cognitive functioning                                 General Comments: alert and oriented to self, place, more generally to situation/time; follows all commands with cues for initiation and problem solving; pt somewhat tangential and distracted but able to redirect to task with cues      Exercises Other Exercises Other Exercises: Pt performed active AP, heel slides, and hip int rotation/hip add x 10 bilat; SAQ x 10 bilat with poor eccentric control; bilat active shoulder elevation x 10 Other Exercises: self feeding    General Comments        Pertinent Vitals/Pain Pain Assessment: 0-10 Pain Score: 9  Faces Pain Scale: Hurts a little bit Pain Location: headache; pt also reported R flank pain however could not quatify Pain Descriptors / Indicators: Aching;Discomfort Pain Intervention(s): Patient requesting pain meds-RN notified;RN gave pain meds during session;Monitored  during session    Home Living                      Prior Function            PT Goals (current goals can now be found in the care plan section) Acute Rehab PT Goals Patient Stated Goal: go home PT Goal Formulation: With patient Time For Goal Achievement: 04/16/20 Potential to Achieve Goals: Fair Progress towards PT goals: Progressing toward goals    Frequency    Min 2X/week      PT Plan Current plan remains appropriate    Co-evaluation              AM-PAC PT "6 Clicks" Mobility   Outcome Measure  Help needed turning from your back to your side while in a flat bed without using bedrails?: A Lot Help needed moving  from lying on your back to sitting on the side of a flat bed without using bedrails?: A Lot Help needed moving to and from a bed to a chair (including a wheelchair)?: A Lot Help needed standing up from a chair using your arms (e.g., wheelchair or bedside chair)?: A Lot Help needed to walk in hospital room?: A Lot Help needed climbing 3-5 steps with a railing? : A Lot 6 Click Score: 12    End of Session   Activity Tolerance: Patient limited by fatigue Patient left: in bed;with call bell/phone within reach;with bed alarm set Nurse Communication: Other (comment) (about request for pain meds for headache) PT Visit Diagnosis: Repeated falls (R29.6);Muscle weakness (generalized) (M62.81);History of falling (Z91.81);Pain Pain - Right/Left: Right Pain - part of body:  (head; R flank)     Time: 2119-4174 PT Time Calculation (min) (ACUTE ONLY): 28 min  Charges:                        Vale Haven, SPT   Vale Haven 04/06/2020, 1:24 PM

## 2020-04-06 NOTE — Progress Notes (Signed)
  Speech Language Pathology Treatment: Dysphagia  Patient Details Name: Cynthia [name] MRN: [id number] DOB: [birth year] Today's Date: 04/06/2020 Time: 8828-0034 SLP Time Calculation (min) (ACUTE ONLY): 16 min  Assessment / Plan / Recommendation Clinical Impression  Skilled treatment session targeted pt's dysphagia goals. SLP facilitated session by providing skilled observation of pt consuming dysphagia 2 breakfast tray with thin liquids via straw. Nursing also states that pt is receiving some medicines in liquid form as they can't be crushed. Pt's swallow appeared swift with no overt s/s of aspiration. With soft solids, pt demonstrates mildly prolonged mastication but was functional given her edentulous status and her distractibility. Given this, recommend continue dysphagia 2 diet with thin liquids, medicine whole in puree. ST to sign off at this time.    HPI HPI:  Chest x-ray done today revealed a stable cardiomediastinal silhouette however bilateral patchy pulmonary infiltrates has worsened.       SLP Plan  All goals met       Recommendations  Diet recommendations: Dysphagia 2 (fine chop);Thin liquid Liquids provided via: Straw Medication Administration: Whole meds with puree Supervision: Staff to assist with self feeding Compensations: Minimize environmental distractions;Slow rate;Small sips/bites Postural Changes and/or Swallow Maneuvers: Seated upright 90 degrees                Oral Care Recommendations: Oral care BID Follow up Recommendations: Skilled Nursing facility SLP Visit Diagnosis: Dysphagia, unspecified (R13.10) Plan: All goals met       GO              Latesha Chesney B. Rutherford Nail M.S., CCC-SLP, [place] Office [phone]   Stormy Fabian 04/06/2020, 12:05 PM

## 2020-04-06 NOTE — Progress Notes (Signed)
Occupational Therapy Treatment Patient Details Name: Cynthia [name] MRN: [id number] DOB: 05-20-1956 Today's Date: 04/06/2020    History of present illness Cynthia [name] is an 64 y.o. female with PMH of COPD, Seizure disorder, second-degree heart block s/p PPM placement, PVD s/p multiple stent placements (11/2019), h/o CVA, Emphysema, HTN, HLD,TBI with residual memory deficit, seizures, chronic hepatitis C, bipolar disorder, h/o substance abuse, PTSD and tobacco use who was admitted with acute hypoxic respiratory failure secondary to CAP and COPD with acute exacerbation   OT comments  Pt seen for OT tx this date. Pt resting in bed, wakes easily and agreeable to OT session. HOB lowered and multimodal cues required for hand/foot placement to support pt's participation in scooting up in bed for improved overall positioning, joint protection, and in preparation for breakfast. Pt demonstrated difficulty with bilat manipulation tasks to prepare meal, requiring set up for containers/packets, coffee, and utensils. RUE strength deficits limited pt's ability to self feed requiring Min A for scooping and bringing cup and spoon to mouth. Small amount of spillage noted. No s/s aspiration throughout. RUE repositioned with rolled towel to increase shoulder flexion/abduction to improve pt's ability to bring cup/spoon to her mouth, but ultimately still requiring Min A to complete. Pt mid-way through meal when she notes increased back/neck pain (unable to quantify but states "It's not much; I don't like to complain" and requests for [facility] to be lowered. Pt educated in aspiration risk and need for max HOB elevation during all meals. Pt verbalized understanding and agreeable to pausing breakfast in order to improve comfort. HOB lowered to approx 65 degrees with pt noting improved comfort. During session, pt noted to have had BM. Pt requests to finish eating breakfast before assist for hygiene. Nurse tech promptly notified of need  for assist to complete meal when ready and for hygiene. Pt continues to benefit from skilled OT services to maximize return to PLOF, minimize risk of falls/readmission, and minimize caregiver burden. Continue to recommend SNF.    Follow Up Recommendations  SNF    Equipment Recommendations  3 in 1 bedside commode    Recommendations for Other Services      Precautions / Restrictions Precautions Precautions: Fall Restrictions Weight Bearing Restrictions: No       Mobility Bed Mobility Overal bed mobility: Needs Assistance             General bed mobility comments: max A for scooting up in bed, cues for hand/foot placement for pt to participate but ultimately unable to assist much  Transfers                      Balance                                           ADL either performed or assessed with clinical judgement   ADL                                         General ADL Comments: continued Min A for self-feeding after set up; pt unable to open containers/packets using bilat manipulation of objects; RUE repositioned with rolled towel to increase shoulder flexion/abduction to improve pt's ability to bring cup/spoon to her mouth, but ultimately still requiring Min A to complete.  Vision       Perception     Praxis      Cognition Arousal/Alertness: Awake/alert Behavior During Therapy: WFL for tasks assessed/performed Overall Cognitive Status: No family/caregiver present to determine baseline cognitive functioning                                 General Comments: alert and oriented to self, place, more generally to situation/time; follows all commands with cues for initiation and problem solving; pt somewhat tangential and distracted but able to redirect to task with cues        Exercises Other Exercises Other Exercises: self feeding   Shoulder Instructions       General Comments       Pertinent Vitals/ Pain       Pain Assessment: Faces Faces Pain Scale: Hurts a little bit Pain Location: neck/back pain after HOB >80 degrees for self feeding for approx 15 min Pain Descriptors / Indicators: Aching Pain Intervention(s): Limited activity within patient's tolerance;Monitored during session;Repositioned  Home Living                                          Prior Functioning/Environment              Frequency  Min 2X/week        Progress Toward Goals  OT Goals(current goals can now be found in the care plan section)  Progress towards OT goals: OT to reassess next treatment  Acute Rehab OT Goals Patient Stated Goal: go home OT Goal Formulation: With patient/family Time For Goal Achievement: 04/18/20 Potential to Achieve Goals: [place] Discharge plan remains appropriate;Frequency remains appropriate    Co-evaluation                 AM-PAC OT "6 Clicks" Daily Activity     Outcome Measure   Help from another person eating meals?: A Lot Help from another person taking care of personal grooming?: A Lot Help from another person toileting, which includes using toliet, bedpan, or urinal?: Total Help from another person bathing (including washing, rinsing, drying)?: Total Help from another person to put on and taking off regular upper body clothing?: A Lot Help from another person to put on and taking off regular lower body clothing?: Total 6 Click Score: 9    End of Session    OT Visit Diagnosis: Unsteadiness on feet (R26.81);Repeated falls (R29.6)   Activity Tolerance Patient tolerated treatment well   Patient Left in bed;with call bell/phone within reach;with bed alarm set   Nurse Communication Other (comment) (nurse tech - pt needs additional assist for breakfast and for cleaning up afterwards)        Time: 3953-2023 OT Time Calculation (min): 24 min  Charges: OT General Charges $OT Visit: 1 Visit OT  Treatments $Self Care/Home Management : 23-37 mins  Jeni Salles, MPH, MS, OTR/L ascom [phone] 04/06/20, 11:05 AM

## 2020-04-06 NOTE — Care Management Important Message (Signed)
Important Message  Patient Details  Name: Cynthia [name] MRN: [id number] Date of Birth: [birth year]   Medicare Important Message Given:  Yes     Juliann Pulse A Tywana Robotham 04/06/2020, 12:11 PM

## 2020-04-06 NOTE — Progress Notes (Signed)
Patient was c/o nausea and she did not have anything PRN for same. Messaged Dr. Reesa Chew, new order for  Zofran 4 mg disintegrating tablet. Given to patient.

## 2020-04-07 DIAGNOSIS — S069X5S Unspecified intracranial injury with loss of consciousness greater than 24 hours with return to pre-existing conscious level, sequela: Secondary | ICD-10-CM

## 2020-04-07 DIAGNOSIS — J9601 Acute respiratory failure with hypoxia: Secondary | ICD-10-CM | POA: Diagnosis not present

## 2020-04-07 DIAGNOSIS — Z950 Presence of cardiac pacemaker: Secondary | ICD-10-CM | POA: Diagnosis not present

## 2020-04-07 DIAGNOSIS — N179 Acute kidney failure, unspecified: Secondary | ICD-10-CM | POA: Diagnosis not present

## 2020-04-07 DIAGNOSIS — R0602 Shortness of breath: Secondary | ICD-10-CM

## 2020-04-07 DIAGNOSIS — J189 Pneumonia, unspecified organism: Secondary | ICD-10-CM | POA: Diagnosis not present

## 2020-04-07 LAB — CBC WITH DIFFERENTIAL/PLATELET
Abs Immature Granulocytes: 0.06 10*3/uL (ref 0.00–0.07)
Basophils Absolute: 0 10*3/uL (ref 0.0–0.1)
Basophils Relative: 0 %
Eosinophils Absolute: 0.1 10*3/uL (ref 0.0–0.5)
Eosinophils Relative: 1 %
HCT: 33.9 % — ABNORMAL LOW (ref 36.0–46.0)
Hemoglobin: 10.8 g/dL — ABNORMAL LOW (ref 12.0–15.0)
Immature Granulocytes: 1 %
Lymphocytes Relative: 24 %
Lymphs Abs: 1.3 10*3/uL (ref 0.7–4.0)
MCH: 30.7 pg (ref 26.0–34.0)
MCHC: 31.9 g/dL (ref 30.0–36.0)
MCV: 96.3 fL (ref 80.0–100.0)
Monocytes Absolute: 0.3 10*3/uL (ref 0.1–1.0)
Monocytes Relative: 5 %
Neutro Abs: 3.9 10*3/uL (ref 1.7–7.7)
Neutrophils Relative %: 69 %
Platelets: 176 10*3/uL (ref 150–400)
RBC: 3.52 MIL/uL — ABNORMAL LOW (ref 3.87–5.11)
RDW: 14.8 % (ref 11.5–15.5)
WBC: 5.6 10*3/uL (ref 4.0–10.5)
nRBC: 0 % (ref 0.0–0.2)

## 2020-04-07 LAB — PHOSPHORUS: Phosphorus: 3.9 mg/dL (ref 2.5–4.6)

## 2020-04-07 LAB — GLUCOSE, CAPILLARY
Glucose-Capillary: 153 mg/dL — ABNORMAL HIGH (ref 70–99)
Glucose-Capillary: 94 mg/dL (ref 70–99)

## 2020-04-07 LAB — MAGNESIUM: Magnesium: 2 mg/dL (ref 1.7–2.4)

## 2020-04-07 MED ORDER — ENSURE ENLIVE PO LIQD: 237.0000 mL | Freq: Two times a day (BID) | ORAL | 12 refills | Status: DC

## 2020-04-07 MED ORDER — AMLODIPINE BESYLATE 5 MG PO TABS: 5.0000 mg | ORAL_TABLET | Freq: Every day | ORAL | Status: DC

## 2020-04-07 MED ORDER — NICOTINE 21 MG/24HR TD PT24: 21.0000 mg | MEDICATED_PATCH | Freq: Every day | TRANSDERMAL | 0 refills | Status: DC

## 2020-04-07 MED ORDER — FOLIC ACID 1 MG PO TABS: 1.0000 mg | ORAL_TABLET | Freq: Every day | ORAL | Status: DC

## 2020-04-07 MED ORDER — ADULT MULTIVITAMIN W/MINERALS CH: 1.0000 | ORAL_TABLET | Freq: Every day | ORAL | Status: DC

## 2020-04-07 NOTE — TOC Transition Note (Signed)
Transition of Care [facility]) - CM/SW Discharge Note   Patient Details  Name: Cynthia [name] MRN: [id number] Date of Birth: [birth year]  Transition of Care [facility]) CM/SW Contact:  Shelbie Hutching, RN Phone Number: 04/07/2020, 1:02 PM   Clinical Narrative:    Patient is medically stable for discharge to SNF.  Patient is going to [facility] room 808.  Bedside RN will call report to [phone].  This RNCM will arrange [facility] transport to facility.  Patient's husband is at the bedside and aware of discharge plan.    Final next level of care: Skilled Nursing Facility Barriers to Discharge: Barriers Resolved   Patient Goals and CMS Choice Patient states their goals for this hospitalization and ongoing recovery are:: 'To get stronger" CMS Medicare.gov Compare Post Acute Care list provided to:: Patient Represenative (must comment) Choice offered to / list presented to : Spouse [name])  Discharge Placement PASRR number recieved: 04/07/20            Patient chooses bed at: [facility]  Patient to be transferred to facility by: [facility] Name of family member notified: [name] Patient and family notified of of transfer: 04/07/20  Discharge Plan and Services In-house Referral: Clinical Social Work Discharge Planning Services: [employer] Consult Post Acute Care Choice: Skilled Nursing Facility                    HH Arranged: RN, PT, OT, Nurse's Aide, Social Work [facility] Agency: [facility] Date [place] Agency Contacted: 04/06/20 Time [place]: 0312 Representative spoke with at [place]: [place] ([place]) Interventions     Readmission Risk Interventions No flowsheet data found.

## 2020-04-07 NOTE — Progress Notes (Signed)
To whom it may concern: Please be advised that the above named patient will require short term nursing home stay- anticipated 30 days or less for rehabilitation and strengthening.  The Plan is to return home.

## 2020-04-07 NOTE — TOC Progression Note (Addendum)
Transition of Care [facility]) - Progression Note    Patient Details  Name: Cynthia [name] MRN: [id number] Date of Birth: [birth year]  Transition of Care [facility]) CM/SW Contact  Shelbie Hutching, RN Phone Number: 04/07/2020, 10:58 AM  Clinical Narrative:    Patient's husband was at the bedside when OT worked with patient this morning.  He was able to see that the patient is currently not able to stand and is very weak.  He agrees to SNF for short term rehab.  He lives in [place] and would like for patient to go to [facility].  [facility] has offered a bed and bed has been accepted.  If medically cleared patient can discharge today.   Pasrr [id number] E- exp 05/07/2020   Expected Discharge Plan: [place] Barriers to Discharge: Barriers Resolved  Expected Discharge Plan and Services Expected Discharge Plan: [place] In-house Referral: Clinical Social Work Discharge Planning Services: CM Consult Post Acute Care Choice: [place] arrangements for the past 2 months: [place]: RN, PT, OT, Nurse's Aide, Social Work [facility] Agency: [facility] Date [place]: 04/06/20 Time [place]: [id number] Representative spoke with at [place]: [place] ([place]) Interventions    Readmission Risk Interventions No flowsheet data found.

## 2020-04-07 NOTE — TOC Progression Note (Signed)
Transition of Care [facility]) - Progression Note    Patient Details  Name: Cynthia [name] MRN: [id number] Date of Birth: 1956/08/22  Transition of Care [facility]) CM/SW Contact  Shelbie Hutching, RN Phone Number: 04/07/2020, 2:27 PM  Clinical Narrative:    [facility] has been arranged for transport to [facility]- patient is first on the list for transport.    Expected Discharge Plan: Skilled Nursing Facility Barriers to Discharge: Barriers Resolved  Expected Discharge Plan and Services Expected Discharge Plan: [place] In-house Referral: Clinical Social Work Discharge Planning Services: CM Consult Post Acute Care Choice: [place] arrangements for the past 2 months: [place] Expected Discharge Date: 04/07/20                         HH Arranged: RN, PT, OT, Nurse's Aide, Social Work [employer] Agency: [facility] Date [place]: 04/06/20 Time [place]: 2518 Representative spoke with at [place]: [place] ([place]) Interventions    Readmission Risk Interventions No flowsheet data found.

## 2020-04-07 NOTE — Progress Notes (Signed)
Occupational Therapy Treatment Patient Details Name: Cynthia [name] MRN: [id number] DOB: [birth year] Today's Date: 04/07/2020    History of present illness Cynthia [name] is an 64 y.o. female with PMH of COPD, Seizure disorder, second-degree heart block s/p PPM placement, PVD s/p multiple stent placements (11/2019), h/o CVA, Emphysema, HTN, HLD,TBI with residual memory deficit, seizures, chronic hepatitis C, bipolar disorder, h/o substance abuse, PTSD and tobacco use who was admitted with acute hypoxic respiratory failure secondary to CAP and COPD with acute exacerbation   OT comments  Pt seen for OT Tx this date to f/u re: safety with self care ADLs/ADL mobility. Pt's spouse present throughout session and demos vested interest in pt's progress as he ultimately wants to take her home versus rehab. OT facilitates pt participation in sup to sit with MOD A and pt tolerates EOB sitting x~15 minutes with mostly MIN A to sustain static sitting, demo'ing POOR static sitting balance (impacted by potential fatigue as pt with some periods of eye-closing requiring verbal/tactile cueing and encouragement to participate). Pt does have 3 short bouts of sustaining static sit with SBA with cues to participate in sitting. One sit-to-stand trialed with pt requiring MIN/MOD A +2 with pt's husband participating with occupational therapist education re: safe transfer technique. Pt tolerates standing x~10 seconds before requesting rest and demonstrating less effort to maintain stand. Once back in EOB sitting, pt requires MOD/MAX A to manage UB/LB for back to bed. Ultimately, SNF appears to remain safest d/c recommendation from OT standpoint.    Follow Up Recommendations  SNF    Equipment Recommendations  3 in 1 bedside commode    Recommendations for Other Services      Precautions / Restrictions Precautions Precautions: Fall Restrictions Weight Bearing Restrictions: No       Mobility Bed Mobility Overal bed  mobility: Needs Assistance Bed Mobility: Supine to Sit;Sit to Supine     Supine to sit: Mod assist Sit to supine: Mod assist;Max assist   General bed mobility comments: MOD/MAX A to manage UB/LB back to bed  Transfers Overall transfer level: Needs assistance Equipment used: 2 person hand held assist Transfers: Sit to/from Stand Sit to Stand: Min assist;Mod assist;+2 physical assistance         General transfer comment: 2p assist as pt with P static sitting balance with postural sway in each direction intermittently. Pt's spouse wanting to take her home and OT instructs him in how to assist with transfer should they decide to stick with this plan inspite of therapy recommendation. Pt's spouse assists, but verbalizes understanding of difficulty.    Balance Overall balance assessment: Needs assistance Sitting-balance support: Single extremity supported Sitting balance-Leahy Scale: Poor Sitting balance - Comments: pt with postural sway in each direction intermittently. Requires MOD verbal cues to participate in short bouts of maintaining static sitting balance with close SBA for ~5-10 seconds x3 trials during entire sitting bout (~15 minutes)   Standing balance support: Bilateral upper extremity supported Standing balance-Leahy Scale: Poor Standing balance comment: Requires MOD verbal cues to extend to full stand as pt can be felt bracing with calves on bed posteriorly. Pt, once more engaged in task, with improved balance, but overall P static standing with bilateral external support.                           ADL either performed or assessed with clinical judgement   ADL  Vision       Perception     Praxis      Cognition Arousal/Alertness: Awake/alert Behavior During Therapy: WFL for tasks assessed/performed Overall Cognitive Status: Difficult to assess                                  General Comments: Pt with delayed responses throughout, but able to follow one step commands appropriately with extended time.        Exercises Other Exercises Other Exercises: OT facilitates education with pt and spouse re: safety considerations regarding discharge including fall prevention. Pt's spouse with good understanding. Pt appears to have moderate understanding of education.   Shoulder Instructions       General Comments      Pertinent Vitals/ Pain       Pain Assessment: Faces Faces Pain Scale: Hurts little more Pain Location: headache Pain Descriptors / Indicators: Aching;Discomfort Pain Intervention(s): Limited activity within patient's tolerance;Monitored during session  Home Living                                          Prior Functioning/Environment              Frequency  Min 2X/week        Progress Toward Goals  OT Goals(current goals can now be found in the care plan section)  Progress towards OT goals: Not progressing toward goals - comment (pt waxes and wanes, somewhat better performance today regarding tolerance for OOB activity, but bouts of very limited participation impeding progress/ability to measure progress with therapy.)  Acute Rehab OT Goals Patient Stated Goal: go home OT Goal Formulation: With patient/family Time For Goal Achievement: 04/18/20 Potential to Achieve Goals: [place] Discharge plan remains appropriate;Frequency remains appropriate    Co-evaluation                 AM-PAC OT "6 Clicks" Daily Activity     Outcome Measure   Help from another person eating meals?: A Lot Help from another person taking care of personal grooming?: A Lot Help from another person toileting, which includes using toliet, bedpan, or urinal?: Total Help from another person bathing (including washing, rinsing, drying)?: Total Help from another person to put on and taking off regular upper body clothing?: A  Lot Help from another person to put on and taking off regular lower body clothing?: Total 6 Click Score: 9    End of Session Equipment Utilized During Treatment: Gait belt  OT Visit Diagnosis: Unsteadiness on feet (R26.81);Repeated falls (R29.6)   Activity Tolerance Patient tolerated treatment well;Patient limited by fatigue   Patient Left in bed;with call bell/phone within reach;with bed alarm set;with family/visitor present   Nurse Communication          Time: 8676-7209 OT Time Calculation (min): 26 min  Charges: OT General Charges $OT Visit: 1 Visit OT Treatments $Therapeutic Activity: 23-37 mins  Gerrianne Scale, MS, OTR/L ascom [phone] 04/07/20, 2:19 PM

## 2020-04-07 NOTE — Progress Notes (Signed)
PICC was removed from right upper arm before discharge. Patient is going to [facility]. Called report to [facility] nurse at [facility]. Patients husband is at the beside. Patient will be transported via EMS.

## 2020-04-07 NOTE — Discharge Summary (Signed)
Physician Discharge Summary  [name] MRN:[id number] DOB: 12/30/1955 DOA: 03/20/2020  PCP: Tapia, Leisa, PA-C  Admit date: 03/20/2020 Discharge date: 04/07/2020  Admitted From: Home Disposition:  SNF  Recommendations for Outpatient Follow-up:  1. Follow up with PCP in 1-2 weeks 2. Please obtain BMP/CBC in one week 3. 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 fib with RVR she was transferred to stepdown on 6/26. She failed BiPAP and high flow nasal cannula and transferred to ICU status on 6/28. Required intubation on 6/29, extubated on 7/7.She was transferred to medical floor on 7/10 andis weaned off to room air.  She completed the course of antibiotics and remained stable since then.  Patient has generalized weakness secondary to physical deconditioning with this acute illness. Our PT are recommending rehab.  And patient is being discharged to rehab for further management and rehab. Due to her worsening weakness more dominant in upper body she underwent stroke work-up which was negative.  Patient has an history of paroxysmal A. fib, currently rate control and cardiology is not recommending anticoagulation due to risk of fall.  Patient also has moderate protein calorie  malnutrition.  Patient has an history of bipolar disorder, her home dose of lithium and Seroquel was initially held due to acute illness and later restarted.  She will continue with rest of her home medications and follow-up with her primary care physician and psychiatrist for further management.  Discharge Diagnoses:  Principal Problem:   CAP (community acquired pneumonia) Active Problems:   Essential hypertension, benign   Traumatic brain injury (HCC)   Seizures (HCC)   Bipolar affective disorder (HCC)   Artificial cardiac pacemaker   COPD with acute exacerbation (HCC)   AKI (acute kidney injury) (HCC)   Acute respiratory failure (HCC)   Discharge Instructions  Discharge Instructions    Diet - low sodium heart healthy   Complete by: As directed    Increase activity slowly   Complete by: As directed      Allergies as of 04/07/2020      Reactions   Morphine Anaphylaxis   Cardiac arrest      Medication List    STOP taking these medications   Vitamin D (Ergocalciferol) 1.25 MG (50000 UNIT) Caps capsule Commonly known as: DRISDOL     TAKE these medications   albuterol 108 (90 Base) MCG/ACT inhaler Commonly known as: ProAir HFA INHALE 2 PUFFS BY MOUTH EVERY 6 HOURS IFNEEDED FOR WHEEZING OR SHORTNESS OF BREATH. What changed:   how much to take  how to take this  when to take this  reasons to take this  additional instructions   amantadine 100 MG capsule Commonly known as: SYMMETREL Take 1 capsule (100 mg total) by mouth daily.   amLODipine 5 MG tablet Commonly known as: NORVASC Take 1 tablet (5 mg total) by mouth daily. Start taking on: April 08, 2020 What changed:   medication strength  how much to take     aspirin EC 81 MG tablet Take 1 tablet (81 mg total) by mouth daily.   atorvastatin 20 MG tablet Commonly known as: LIPITOR Take 1 tablet (20 mg total) by mouth at bedtime.   Bevespi Aerosphere 9-4.8 MCG/ACT Aero Generic drug:  Glycopyrrolate-Formoterol Inhale 2 puffs into the lungs 2 (two) times daily. What changed:   when to take this  reasons to take this   Chantix Starting Month Pak 0.5 MG X 11 & 1 MG X 42 tablet Generic drug: varenicline Take 0.5 mg tablet by mouth 1x daily for 3 days, then increase to 0.5 mg tablet 2x daily for 4 days, then increase to 1 mg tablet 2x daily.   clopidogrel 75 MG tablet Commonly known as: Plavix Take 1 tablet (75 mg total) by mouth daily.   famotidine 20 MG tablet Commonly known as: Pepcid Take 1 tablet (20 mg total) by mouth 2 (two) times daily as needed for heartburn (allergic reachtion/hives).   feeding supplement (ENSURE ENLIVE) Liqd Take 237 mLs by mouth 2 (two) times daily between meals.   folic acid 1 MG tablet Commonly known as: FOLVITE Take 1 tablet (1 mg total) by mouth daily. Start taking on: April 08, 2020   gabapentin 300 MG capsule Commonly known as: NEURONTIN Take 1 capsule (300 mg total) by mouth daily.   hydrOXYzine 25 MG tablet Commonly known as: ATARAX/VISTARIL Take 1-2 tablets (25-50 mg total) by mouth 3 (three) times daily as needed for itching.   ipratropium-albuterol 0.5-2.5 (3) MG/3ML Soln Commonly known as: DUONEB Take 3 mLs by nebulization 3 (three) times daily as needed.   lithium carbonate 300 MG capsule Take 1 capsule (300 mg total) by mouth 2 (two) times daily with a meal.   losartan 50 MG tablet Commonly known as: COZAAR Take 1 tablet (50 mg total) by mouth daily.   multivitamin with minerals Tabs tablet Take 1 tablet by mouth daily. Start taking on: April 08, 2020   Nebulizer/Tubing/Mouthpiece Kit Disp one nebulizer machine, tubing set and mouthpiece kit   nicotine 21 mg/24hr patch Commonly known as: NICODERM CQ - dosed in mg/24 hours Place 1 patch (21 mg total) onto the skin daily. Start taking on: April 08, 2020   nitroGLYCERIN 0.4 MG SL tablet Commonly known as: NITROSTAT Place 1 tablet (0.4 mg total) under the  tongue every 5 (five) minutes as needed for chest pain. Maximum of 3 pills; call 911 at first sign of chest pain   Oxcarbazepine 300 MG tablet Commonly known as: TRILEPTAL Take 1 tablet (300 mg total) by mouth at bedtime.   QUEtiapine 100 MG tablet Commonly known as: SEROQUEL Take half tablet in the morning and 1 tablet at night What changed:   how much to take  how to take this  when to take this   vitamin C 500 MG tablet Commonly known as: ASCORBIC ACID Take 1 tablet (500 mg total) by mouth daily.       Contact information for after-discharge care    Destination    [facility] .   Service: Skilled Nursing Contact information: [place] [phone]                 Allergies  Allergen Reactions  . Morphine Anaphylaxis    Cardiac arrest    Consultations:   neurology  PCCM  Procedures/Studies: EEG  Result Date: 03/28/2020 Philemon Kingdom, MD     03/28/2020  8:06 PM Date  of Study: 03/28/2020 Reason for Study:  Pt is a 64 YOF with PMH of HTN, HLD, COPD, tobacco use/emphysema, second-degree heart block s/p PPM placement, PVD s/p multiple stent placements (11/2019), chronic hepatitis C, bipolar disorder, h/o substance abuse, PTSD, h/o CVA, SEIZURES, TBI with residual memory deficit who had acute hypoxic respiratory failure secondary to CAP and COPD with acute exacerbation and AMS. Eval for seizures. Description of recording: This recording was obtained using a Digital EEG system with 18 channel capacity . Standard bipolar and referential EEG montages were used following the International 10 - 20 System . This is a digitally recorded EEG with duration of approximately ~20:34 minutes. The background consists of 4-7 hz delta/theta rhythm throughout study.  There are some rare triphasic waves. There were no epileptiform discharges or electrographic seizures noted. Hyperventilation was not performed. Photic  stimulation was not performed. Sleep did not occur in this recording. EKG shows normal sinus rhythm throughout the study. IMPRESSION: This is an abnormal EEG for age due to diffuse background slowing and rare triphasic waves which can be seen with toxic/metabolic encephalopathy, cerebral ischemia, intracerebral hemorrhage, tumors, traumatic brain injury, cortical malformations, or other cerebral dysfunction. There were no clinical seizures or epileptiform discharges noted during study. Clinical correlation is advised. I have personally reviewed this study.  CT ABDOMEN PELVIS WO CONTRAST  Result Date: 03/20/2020 CLINICAL DATA:  Periumbilical abdominal pain. EXAM: CT ABDOMEN AND PELVIS WITHOUT CONTRAST TECHNIQUE: Multidetector CT imaging of the abdomen and pelvis was performed following the standard protocol without IV contrast. COMPARISON:  None. FINDINGS: Lower chest: Marked severity infiltrates are seen within the posterior aspect of the bilateral lower lobes, left greater than right. Chronic posterior right ninth and tenth rib fractures are seen. Hepatobiliary: No focal liver abnormality is seen. The gallbladder is markedly distended without evidence of gallstones, gallbladder wall thickening, or biliary dilatation. Pancreas: Unremarkable. No pancreatic ductal dilatation or surrounding inflammatory changes. Spleen: Normal in size without focal abnormality. Adrenals/Urinary Tract: 3.1 cm x 1.5 cm and 1.5 cm x 1.2 cm low-attenuation left adrenal masses are seen. Kidneys are normal, without renal calculi, focal lesion, or hydronephrosis. Bladder is unremarkable. Stomach/Bowel: Stomach is within normal limits. Appendix appears normal. No evidence of bowel wall thickening, distention, or inflammatory changes. Vascular/Lymphatic: There is marked severity calcification of the abdominal aorta. Bilateral common iliac artery and bilateral external iliac artery stents are seen. No enlarged abdominal or pelvic lymph  nodes. Reproductive: Uterus and bilateral adnexa are unremarkable. Other: No abdominal wall hernia or abnormality. No abdominopelvic ascites. Musculoskeletal: Marked severity degenerative changes are seen at the level of L5-S1. IMPRESSION: 1. Marked severity bilateral lower lobe infiltrates, left greater than right. 2. Low-attenuation left adrenal masses which may represent adrenal adenomas. 3. Marked severity calcification of the abdominal aorta. 4. Bilateral common iliac artery and bilateral external iliac artery stents. 5. Marked severity degenerative changes at the level of L5-S1. Aortic Atherosclerosis (ICD10-I70.0). Electronically Signed   By: Thaddeus  Houston M.D.   On: 03/20/2020 20:52   DG Chest 1 View  Result Date: 03/21/2020 CLINICAL DATA:  Abdominal distension EXAM: CHEST  1 VIEW COMPARISON:  Radiograph 03/20/2020, CT abdomen pelvis 03/20/2020, CT chest 11/25/2019 FINDINGS: Dual lead pacer pack overlies left chest wall with leads in stable position at the right atrium and cardiac apex. Telemetry leads and nasal cannula overlie the chest. Persistent bibasilar consolidative opacities. Suspect a trace left effusion. Some fissural and septal thickening is noted with increasing central venous congestion and central cuffing. Could   reflect superimposed edema. Remote posttraumatic deformity of the distal right clavicle. No acute osseous or soft tissue abnormality. The aorta is calcified. The remaining cardiomediastinal contours are unremarkable. IMPRESSION: 1. Persistent bibasilar consolidative opacities. 2. Increasing central venous congestion, cuffing and septal thickening. Likely developing interstitial edema. Electronically Signed   By: Price  DeHay M.D.   On: 03/21/2020 02:15   DG Chest 2 View  Result Date: 03/20/2020 CLINICAL DATA:  Shortness of breath. EXAM: CHEST - 2 VIEW COMPARISON:  July 11, 2017 FINDINGS: There is a dual lead AICD. Mild to moderate severity areas of atelectasis and/or  infiltrate are seen within the bilateral lung bases. There is a small left pleural effusion. No pneumothorax is identified. The heart size and mediastinal contours are within normal limits. The visualized skeletal structures are unremarkable. IMPRESSION: 1. Mild to moderate severity bibasilar atelectasis and/or infiltrate. 2. Small left pleural effusion. Electronically Signed   By: Thaddeus  Houston M.D.   On: 03/20/2020 20:53   DG Abd 1 View  Result Date: 03/21/2020 CLINICAL DATA:  Abdominal distension EXAM: ABDOMEN - 1 VIEW COMPARISON:  CT abdomen pelvis 03/20/2020 FINDINGS: Persistent consolidative opacities in the lung bases. Trace left effusion. Cardiomediastinal contours are stable from prior. No high-grade obstructive bowel gas pattern is seen in the abdomen or pelvis. Moderate colonic stool burden. Air and stool projects over the rectal vault. Multiple vascular calcifications noted throughout the upper abdomen including over the renal pelves compatible with findings on CT. No suspicious calcifications. Vascular stenting of the iliac arteries. Multilevel degenerative changes in the spine, hips and pelvis. External rotation of the right hip limits evaluation of the femoral neck. If there is concern for femoral abnormality, recommend dedicated imaging. IMPRESSION: 1. No high-grade obstructive bowel gas pattern in the abdomen or pelvis. 2. Moderate colonic stool burden. 3. Persistent consolidative opacities in the lung bases. Trace left effusion. Electronically Signed   By: Price  DeHay M.D.   On: 03/21/2020 02:12   CT HEAD WO CONTRAST  Result Date: 04/02/2020 CLINICAL DATA:  Upper extremity weakness, history of stroke EXAM: CT HEAD WITHOUT CONTRAST TECHNIQUE: Contiguous axial images were obtained from the base of the skull through the vertex without intravenous contrast. COMPARISON:  03/27/2020 FINDINGS: Brain: There is no acute intracranial hemorrhage, mass effect, or edema. No new loss of gray-white  differentiation. There is no extra-axial fluid collection. Ventricles and sulci are stable in size and configuration. Patchy hypoattenuation in the supratentorial white matter is nonspecific but probably reflects stable chronic microvascular ischemic changes. Vascular: There is atherosclerotic calcification at the skull base. Skull: Calvarium is unremarkable. 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 No significant paranasal sinus opacification. No acute orbital finding. Other: Mastoid air cells are clear. IMPRESSION: No acute intracranial abnormality. No significant change since 03/27/2020. Electronically Signed   By: Praneil  Patel M.D.   On: 04/02/2020 14:12   CT HEAD WO CONTRAST  Result Date: 03/27/2020 CLINICAL DATA:  Encephalopathy. EXAM: CT HEAD WITHOUT CONTRAST TECHNIQUE: Contiguous axial images were obtained from the base of the skull through the vertex without intravenous contrast. COMPARISON:  None. FINDINGS: Brain: There is no evidence of acute infarct, intracranial hemorrhage, mass, midline shift, or extra-axial fluid collection. The ventricles and sulci are within normal limits for age. Hypodensities in the cerebral white matter bilaterally are nonspecific but compatible with mild chronic small vessel ischemic disease. Vascular: Calcified atherosclerosis at the skull base. No hyperdense vessel. Skull: No fracture suspicious osseous lesion. 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 Trace right mastoid fluid. Unremarkable included orbits. Other: None. IMPRESSION: 1. No evidence of acute intracranial abnormality. 2. Mild chronic small vessel ischemic disease. Electronically Signed   By: Allen  Grady M.D.   On: 03/27/2020 12:02   CT CERVICAL SPINE WO CONTRAST  Result Date: 04/02/2020 CLINICAL DATA:  Upper extremity weakness EXAM: CT CERVICAL SPINE WITHOUT CONTRAST TECHNIQUE: Multidetector CT imaging of the cervical spine was performed  without intravenous contrast. Multiplanar CT image reconstructions were also generated. COMPARISON:  None. FINDINGS: Motion artifact is present. Findings below are within this limitation. Alignment: Mild retrolisthesis at C3-C4, C4-C5, and C7-T1 Skull base and vertebrae: Vertebral body heights are preserved apart from degenerative endplate irregularity. No destructive osseous lesion. Soft tissues and spinal canal: No prevertebral fluid or swelling. No visible canal hematoma. Disc levels: Multilevel degenerative changes are present including disc space narrowing, endplate osteophytes, and facet and uncovertebral hypertrophy. No definite high-grade encroachment on the spinal canal, which is mostly obscured by artifact. Foraminal stenosis is greatest on the left from C3-C4 to C5-C6. Upper chest: No apical lung mass. Other: Calcified plaque at the ICA origins. IMPRESSION: Motion degraded study. No definite high-grade spinal canal stenosis. Foraminal stenosis is greatest on the left from C3-C4 to C5-C6. Electronically Signed   By: Praneil  Patel M.D.   On: 04/02/2020 14:05   DG Chest Port 1 View  Result Date: 03/30/2020 CLINICAL DATA:  Acute respiratory failure EXAM: PORTABLE CHEST 1 VIEW COMPARISON:  Chest radiograph from the prior day. FINDINGS: An endotracheal tube terminates 1.5 cm from the carina. An enteric tube enters the stomach and terminates below the field of view. A left subclavian approach cardiac device is redemonstrated. A right upper extremity peripherally inserted central venous catheter tip overlies the superior vena cava. The heart size is normal. Vascular calcifications are seen in the aortic arch. A trace left pleural effusion with associated atelectasis is unchanged. Bilateral interstitial opacities have decreased. The right lung is clear. There is no pneumothorax. IMPRESSION: 1. Unchanged trace left pleural effusion with associated atelectasis. 2. Decreased bilateral interstitial opacities  likely represent resolving pulmonary edema. Electronically Signed   By: Tyler  Litton M.D.   On: 03/30/2020 11:57   DG Chest Port 1 View  Result Date: 03/29/2020 CLINICAL DATA:  64-year-old female with history of acute respiratory failure. EXAM: PORTABLE CHEST 1 VIEW COMPARISON:  Chest x-ray 03/25/2020. FINDINGS: An endotracheal tube is in place with tip 6.1 cm above the carina. There is a right upper extremity PICC with tip terminating in the mid superior vena cava. A nasogastric tube is seen extending into the stomach, however, the tip of the nasogastric tube extends below the lower margin of the image. No acute consolidative airspace disease. Trace left pleural effusion. No right pleural effusion. Cephalization of the pulmonary vasculature with some slight indistinctness of interstitial markings. Heart size appears borderline enlarged. Upper mediastinal contours are within normal limits allowing for patient's rotation to the left. Aortic atherosclerosis. Left-sided pacemaker device in place with lead tips projecting over the expected location of the right atrium and right ventricle. IMPRESSION: 1. Support apparatus, as above. 2. Improving aeration which may reflect resolving multilobar pneumonia and/or resolving pulmonary edema. 3. Trace left pleural effusion. 4. Aortic atherosclerosis. Electronically Signed   By: Daniel  Entrikin M.D.   On: 03/29/2020 05:01   DG Chest Port 1 View  Result Date: 03/25/2020 CLINICAL DATA:  Shortness of breath EXAM: PORTABLE CHEST 1 VIEW COMPARISON:    03/24/2020 FINDINGS: Cardiac shadow is stable. Pacing device is again seen. Endotracheal tube and gastric catheter are noted in satisfactory position. Right-sided PICC line is noted in satisfactory position. Patchy airspace opacities are noted but slightly improved when compared with the prior exam. No bony abnormality is noted. IMPRESSION: Slight improved aeration when compared with the prior study. Electronically Signed   By:  Inez Catalina M.D.   On: 03/25/2020 10:36   DG Chest Port 1 View  Result Date: 03/24/2020 CLINICAL DATA:  Respiratory distress EXAM: PORTABLE CHEST 1 VIEW COMPARISON:  None. FINDINGS: Endotracheal tube essentially at the carina. Recommend retraction by 3 to 4 cm. NG tube in stomach. Normal cardiac silhouette. Diffuse airspace disease slightly increased in density. Small LEFT effusion unchanged IMPRESSION: 1. Endotracheal tube at carina.  Consider retraction by 3-4 cm. 2. Diffuse bilateral airspace disease slightly worse. These results will be called to the ordering clinician or representative by the Radiologist Assistant, and communication documented in the PACS or [employer]. Electronically Signed   By: Suzy Bouchard M.D.   On: 03/24/2020 08:58   DG Chest Port 1 View  Result Date: 03/22/2020 CLINICAL DATA:  Acute respiratory failure EXAM: PORTABLE CHEST 1 VIEW COMPARISON:  March 21, 2020 FINDINGS: Stable pacemaker. The cardiomediastinal silhouette is stable. No pneumothorax. Bilateral patchy pulmonary infiltrates have worsened in the interval. IMPRESSION: Increasing bilateral pulmonary infiltrates may represent diffuse multifocal pneumonia versus developing asymmetric edema. Recommend clinical correlation and short-term follow-up to ensure resolution. Electronically Signed   By: Dorise Bullion III M.D   On: 03/22/2020 07:58   ECHOCARDIOGRAM COMPLETE  Result Date: 03/24/2020    ECHOCARDIOGRAM REPORT   Patient Name:   Cynthia [name] Date of Exam: 03/23/2020 Medical Rec #:  [id number]      Height:       66.0 in Accession #:    [id number]     Weight:       141.5 lb Date of Birth:  [birth year]      BSA:          1.727 m Patient Age:    24 years       BP:           148/62 mmHg Patient Gender: F              HR:           82 bpm. Exam Location:  [facility] Procedure: 2D Echo, Cardiac Doppler and Color Doppler Indications:     Acute Respiratory Insufficiency 518.82 / R06.89  History:         Patient has prior  history of Echocardiogram examinations. Risk                  Factors:Hypertension. 2nd degree heart block.  Sonographer:     Alyse Low Roar Referring Phys:  [id number] Flora Lipps Diagnosing Phys: Isaias Cowman MD IMPRESSIONS  1. Left ventricular ejection fraction, by estimation, is 50 to 55%. The left ventricle has low normal function. The left ventricle has no regional wall motion abnormalities. There is mild left ventricular hypertrophy. Left ventricular diastolic parameters were normal.  2. Right ventricular systolic function is normal. The right ventricular size is normal. There is moderately elevated pulmonary artery systolic pressure.  3. The mitral valve is normal in structure. Mild mitral valve regurgitation. No evidence of mitral stenosis.  4. The aortic valve is normal in structure. Aortic valve regurgitation is mild to moderate. No aortic stenosis is present.  5. The inferior vena  cava is normal in size with greater than 50% respiratory variability, suggesting right atrial pressure of 3 mmHg. FINDINGS  Left Ventricle: Left ventricular ejection fraction, by estimation, is 50 to 55%. The left ventricle has low normal function. The left ventricle has no regional wall motion abnormalities. The left ventricular internal cavity size was normal in size. There is mild left ventricular hypertrophy. Left ventricular diastolic parameters were normal. Right Ventricle: The right ventricular size is normal. No increase in right ventricular wall thickness. Right ventricular systolic function is normal. There is moderately elevated pulmonary artery systolic pressure. The tricuspid regurgitant velocity is 3.07 m/s, and with an assumed right atrial pressure of 10 mmHg, the estimated right ventricular systolic pressure is 47.7 mmHg. Left Atrium: Left atrial size was normal in size. Right Atrium: Right atrial size was normal in size. Pericardium: There is no evidence of pericardial effusion. Mitral Valve: The mitral valve  is normal in structure. Normal mobility of the mitral valve leaflets. Mild mitral valve regurgitation. No evidence of mitral valve stenosis. Tricuspid Valve: The tricuspid valve is normal in structure. Tricuspid valve regurgitation is mild . No evidence of tricuspid stenosis. Aortic Valve: The aortic valve is normal in structure. Aortic valve regurgitation is mild to moderate. Aortic regurgitation PHT measures 410 msec. No aortic stenosis is present. Aortic valve mean gradient measures 6.0 mmHg. Aortic valve peak gradient measures 13.8 mmHg. Aortic valve area, by VTI measures 1.95 cm. Pulmonic Valve: The pulmonic valve was normal in structure. Pulmonic valve regurgitation is not visualized. No evidence of pulmonic stenosis. Aorta: The aortic root is normal in size and structure. Venous: The inferior vena cava is normal in size with greater than 50% respiratory variability, suggesting right atrial pressure of 3 mmHg. IAS/Shunts: No atrial level shunt detected by color flow Doppler.  LEFT VENTRICLE PLAX 2D LVIDd:         4.40 cm  Diastology LVIDs:         3.13 cm  LV e' lateral:   13.90 cm/s LV PW:         1.21 cm  LV E/e' lateral: 5.9 LV IVS:        1.30 cm  LV e' medial:    10.60 cm/s LVOT diam:     1.70 cm  LV E/e' medial:  7.8 LV SV:         64 LV SV Index:   37 LVOT Area:     2.27 cm  RIGHT VENTRICLE RV Mid diam:    3.04 cm RV S prime:     15.40 cm/s LEFT ATRIUM             Index       RIGHT ATRIUM           Index LA diam:        3.55 cm 2.06 cm/m  RA Area:     14.50 cm LA Vol (A2C):   45.5 ml 26.35 ml/m RA Volume:   35.40 ml  20.50 ml/m LA Vol (A4C):   40.9 ml 23.69 ml/m LA Biplane Vol: 44.2 ml 25.60 ml/m  AORTIC VALVE                    PULMONIC VALVE AV Area (Vmax):    1.93 cm     PV Vmax:        1.18 m/s AV Area (Vmean):   1.88 cm     PV Peak grad:   5.6 mmHg AV Area (VTI):       1.95 cm     RVOT Peak grad: 2 mmHg AV Vmax:           186.00 cm/s AV Vmean:          109.000 cm/s AV VTI:            0.330  m AV Peak Grad:      13.8 mmHg AV Mean Grad:      6.0 mmHg LVOT Vmax:         158.00 cm/s LVOT Vmean:        90.200 cm/s LVOT VTI:          0.283 m LVOT/AV VTI ratio: 0.86 AI PHT:            410 msec  AORTA Ao Root diam: 2.50 cm MITRAL VALVE               TRICUSPID VALVE MV Area (PHT): 3.23 cm    TR Peak grad:   37.7 mmHg MV Decel Time: 235 msec    TR Vmax:        307.00 cm/s MV E velocity: 82.60 cm/s MV A velocity: 70.00 cm/s  SHUNTS MV E/A ratio:  1.18        Systemic VTI:  0.28 m MV A Prime:    10.2 cm/s   Systemic Diam: 1.70 cm Isaias Cowman MD Electronically signed by Isaias Cowman MD Signature Date/Time: 03/24/2020/7:47:47 AM    Final    [place] EKG SITE RITE  Result Date: 03/23/2020 If Site Rite image not attached, placement could not be confirmed due to current cardiac rhythm.    Subjective: Patient has no new complaint today.  Husband was at bedside.  She was ready to go to rehab before returning home.  Discharge Exam: Vitals:   04/07/20 0802 04/07/20 0824  BP:  (!) 115/48  Pulse: (!) 56 (!) 57  Resp: 16 16  Temp:  98.2 F (36.8 C)  SpO2: 91% 98%   Vitals:   04/07/20 0414 04/07/20 0500 04/07/20 0802 04/07/20 0824  BP: (!) 117/53   (!) 115/48  Pulse: (!) 56  (!) 56 (!) 57  Resp: _0 Temp: 98.3 F (36.8 C)   98.2 F (36.8 C)  TempSrc:    Oral  SpO2: 92%  91% 98%  Weight:  68.7 kg    Height:        General: Pt is alert, awake, not in acute distress Cardiovascular: RRR, S1/S2 +, no rubs, no gallops Respiratory: CTA bilaterally, no wheezing, no rhonchi Abdominal: Soft, NT, ND, bowel sounds + Extremities: no edema, no cyanosis   The results of significant diagnostics from this hospitalization (including imaging, microbiology, ancillary and laboratory) are listed below for reference.    Microbiology: Recent Results (from the past 240 hour(s))  SARS Coronavirus 2 by RT PCR (hospital order, performed in [facility] hospital lab) Nasopharyngeal  Nasopharyngeal Swab     Status: None   Collection Time: 04/06/20  1:00 PM   Specimen: Nasopharyngeal Swab  Result Value Ref Range Status   SARS Coronavirus 2 NEGATIVE NEGATIVE Final    Comment: (NOTE) SARS-CoV-2 target nucleic acids are NOT DETECTED.  The SARS-CoV-2 RNA is generally detectable in upper and lower respiratory specimens during the acute phase of infection. The lowest concentration of SARS-CoV-2 viral copies this assay can detect is 250 copies / mL. A negative result does not preclude SARS-CoV-2 infection and should not be used as the sole basis for treatment or  other patient management decisions.  A negative result may occur with improper specimen collection / handling, submission of specimen other than nasopharyngeal swab, presence of viral mutation(s) within the areas targeted by this assay, and inadequate number of viral copies (<250 copies / mL). A negative result must be combined with clinical observations, patient history, and epidemiological information.  Fact Sheet for Patients:   https://www.fda.gov/media/136312/download  Fact Sheet for Healthcare Providers: https://www.fda.gov/media/136313/download  This test is not yet approved or  cleared by the [place] FDA and has been authorized for detection and/or diagnosis of SARS-CoV-2 by FDA under an Emergency Use Authorization (EUA).  This EUA will remain in effect (meaning this test can be used) for the duration of the COVID-19 declaration under Section 564(b)(1) of the Act, 21 U.S.C. section 360bbb-3(b)(1), unless the authorization is terminated or revoked sooner.  Performed at [facility], [address] [place], [place] [address]      Labs: BNP (last 3 results) No results for input(s): BNP in the last 8760 hours. Basic Metabolic Panel: Recent Labs  Lab 04/02/20 0415 04/02/20 0415 04/03/20 0456 04/04/20 0638 04/05/20 0610 04/06/20 0530 04/07/20 0508  NA 146*  --  144 138 140  141  --   K 4.4  --  3.8 4.4 4.2 4.1  --   CL 110  --  112* 106 109 112*  --   CO2 27  --  28 27 24 23  --   GLUCOSE 129*  --  154* 114* 110* 122*  --   BUN 36*  --  32* 35* 27* 20  --   CREATININE 0.95  --  0.87 1.00 0.96 0.90  --   CALCIUM 8.8*  --  8.6* 8.5* 8.3* 8.4*  --   MG 2.3   < > 2.3 2.1 2.1 2.1 2.0  PHOS 3.6   < > 3.3 4.3 3.6 3.5 3.9   < > = values in this interval not displayed.   Liver Function Tests: Recent Labs  Lab 04/01/20 0414  AST 19  ALT 35  ALKPHOS 50  BILITOT 0.7  PROT 5.7*  ALBUMIN 2.5*   No results for input(s): LIPASE, AMYLASE in the last 168 hours. No results for input(s): AMMONIA in the last 168 hours. CBC: Recent Labs  Lab 04/03/20 0456 04/04/20 0638 04/05/20 0610 04/06/20 0530 04/07/20 0508  WBC 6.4 7.3 5.2 5.0 5.6  NEUTROABS 5.1 5.7 4.0 3.8 3.9  HGB 11.0* 11.0* 11.5* 10.8* 10.8*  HCT 34.9* 34.8* 36.5 33.9* 33.9*  MCV 96.7 97.5 96.8 95.5 96.3  PLT 202 221 187 184 176   Cardiac Enzymes: No results for input(s): CKTOTAL, CKMB, CKMBINDEX, TROPONINI in the last 168 hours. BNP: Invalid input(s): POCBNP CBG: Recent Labs  Lab 04/06/20 0742 04/06/20 1131 04/06/20 1647 04/06/20 2057 04/07/20 0823  GLUCAP 115* 110* 105* 144* 153*   D-Dimer No results for input(s): DDIMER in the last 72 hours. Hgb A1c No results for input(s): HGBA1C in the last 72 hours. Lipid Profile No results for input(s): CHOL, HDL, LDLCALC, TRIG, CHOLHDL, LDLDIRECT in the last 72 hours. Thyroid function studies No results for input(s): TSH, T4TOTAL, T3FREE, THYROIDAB in the last 72 hours.  Invalid input(s): FREET3 Anemia work up No results for input(s): VITAMINB12, FOLATE, FERRITIN, TIBC, IRON, RETICCTPCT in the last 72 hours. Urinalysis    Component Value Date/Time   COLORURINE AMBER (A) 03/20/2020 2019   APPEARANCEUR CLOUDY (A) 03/20/2020 2019   APPEARANCEUR Cloudy (A) 02/08/2016 1546     LABSPEC 1.015 03/20/2020 2019   PHURINE 6.0 03/20/2020 2019    GLUCOSEU NEGATIVE 03/20/2020 2019   HGBUR SMALL (A) 03/20/2020 2019   BILIRUBINUR NEGATIVE 03/20/2020 2019   BILIRUBINUR Negative 02/08/2016 [place] 03/20/2020 2019   PROTEINUR 100 (A) 03/20/2020 2019   NITRITE NEGATIVE 03/20/2020 2019   LEUKOCYTESUR LARGE (A) 03/20/2020 2019   Sepsis Labs Invalid input(s): PROCALCITONIN,  WBC,  LACTICIDVEN Microbiology Recent Results (from the past 240 hour(s))  SARS Coronavirus 2 by RT PCR (hospital order, performed in [place] hospital lab) Nasopharyngeal Nasopharyngeal Swab     Status: None   Collection Time: 04/06/20  1:00 PM   Specimen: Nasopharyngeal Swab  Result Value Ref Range Status   SARS Coronavirus 2 NEGATIVE NEGATIVE Final    Comment: (NOTE) SARS-CoV-2 target nucleic acids are NOT DETECTED.  The SARS-CoV-2 RNA is generally detectable in upper and lower respiratory specimens during the acute phase of infection. The lowest concentration of SARS-CoV-2 viral copies this assay can detect is 250 copies / mL. A negative result does not preclude SARS-CoV-2 infection and should not be used as the sole basis for treatment or other patient management decisions.  A negative result may occur with improper specimen collection / handling, submission of specimen other than nasopharyngeal swab, presence of viral mutation(s) within the areas targeted by this assay, and inadequate number of viral copies (<250 copies / mL). A negative result must be combined with clinical observations, patient history, and epidemiological information.  Fact Sheet for Patients:   StrictlyIdeas.no  Fact Sheet for Healthcare Providers: BankingDealers.co.za  This test is not yet approved or  cleared by the [place] FDA and has been authorized for detection and/or diagnosis of SARS-CoV-2 by FDA under an Emergency Use Authorization (EUA).  This EUA will remain in effect (meaning this test can be  used) for the duration of the COVID-19 declaration under Section 564(b)(1) of the Act, 21 U.S.C. section 360bbb-3(b)(1), unless the authorization is terminated or revoked sooner.  Performed at [facility], [address] [place], [place] [address]     Time coordinating discharge: Over 30 minutes  SIGNED:  Lorella Nimrod, MD  [facility] 04/07/2020, 11:18 AM  If 7PM-7AM, please contact night-coverage [email or web address]  This record has been created using Systems analyst. Errors have been sought and corrected,but may not always be located. Such creation errors do not reflect on the standard of care.

## 2020-04-10 ENCOUNTER — Telehealth: Payer: Self-pay | Admitting: Family Medicine

## 2020-04-10 DIAGNOSIS — R269 Unspecified abnormalities of gait and mobility: Secondary | ICD-10-CM

## 2020-04-10 DIAGNOSIS — R29898 Other symptoms and signs involving the musculoskeletal system: Secondary | ICD-10-CM

## 2020-04-10 DIAGNOSIS — Z789 Other specified health status: Secondary | ICD-10-CM

## 2020-04-10 DIAGNOSIS — R296 Repeated falls: Secondary | ICD-10-CM

## 2020-04-10 DIAGNOSIS — Z7409 Other reduced mobility: Secondary | ICD-10-CM

## 2020-04-10 DIAGNOSIS — R531 Weakness: Secondary | ICD-10-CM

## 2020-04-10 NOTE — Telephone Encounter (Signed)
Cynthia [name], Education officer, museum from [employer] called and stated that a Face to face was not able to be done yesterday with Pt other provider and Pt has appt in office on Monday and they need an order sent for home health / already set up with [facility] / call Cynthia [name] with that referral @ [phone] for home health

## 2020-04-10 NOTE — Telephone Encounter (Signed)
Pts husband called and is requesting to have someone give him a call back regarding getting the pt skilled nursing due to her being on life support for so long. He states that his insurance is not covering her rehab and they are discharging pt. Please advise.

## 2020-04-13 ENCOUNTER — Ambulatory Visit: Payer: Medicare Other | Admitting: Family Medicine

## 2020-04-13 NOTE — Telephone Encounter (Signed)
Pt will need appointment.  we can not get anything approved through insurance if we have not seen patient.  If she was hospital discharged they should of got her setup with something or her specialists.

## 2020-04-13 NOTE — Telephone Encounter (Signed)
lvm for scheduling °

## 2020-04-13 NOTE — Telephone Encounter (Signed)
Please advise 

## 2020-04-13 NOTE — Telephone Encounter (Signed)
Pt unable to be seen in office Reviewed prior PT/OT notes and ICU progress notes, pt had prolonged hospitalization, was intubated. She had prior generalized weakness which is now worse, last assessments in hospital note she requires assistance with transfering, ADLs, needs PT at least 2x a week for recurrent falls and generalized weakness.  Needs HH RN, PT, OT, SW  Will call to give VO - but pt will need OV when able to safely get her in office or when virtual visit can be coordinated with family.      ICD-10-CM   1. Abnormality of gait and mobility  R26.9   2. Bilateral leg weakness  R29.898   3. Repeated falls  R29.6   4. Generalized weakness  R53.1   5. Impaired mobility and ADLs  Z74.09    Z78.9    Delsa Grana, PA-C

## 2020-04-14 NOTE — Telephone Encounter (Signed)
Patient will need face to face visit. Please fax order and note after appointment for PT/OT and social work. Fax # [phone] karen Kellie Simmering

## 2020-04-15 ENCOUNTER — Ambulatory Visit (INDEPENDENT_AMBULATORY_CARE_PROVIDER_SITE_OTHER): Payer: Medicare Other | Admitting: Family Medicine

## 2020-04-15 ENCOUNTER — Encounter: Payer: Self-pay | Admitting: Family Medicine

## 2020-04-15 ENCOUNTER — Other Ambulatory Visit: Payer: Self-pay

## 2020-04-15 VITALS — BP 134/76 | HR 83 | Temp 97.6°F | Resp 24 | Ht 64.0 in | Wt 135.6 lb

## 2020-04-15 DIAGNOSIS — R29898 Other symptoms and signs involving the musculoskeletal system: Secondary | ICD-10-CM

## 2020-04-15 DIAGNOSIS — R519 Headache, unspecified: Secondary | ICD-10-CM

## 2020-04-15 DIAGNOSIS — Z789 Other specified health status: Secondary | ICD-10-CM

## 2020-04-15 DIAGNOSIS — R269 Unspecified abnormalities of gait and mobility: Secondary | ICD-10-CM

## 2020-04-15 DIAGNOSIS — Z5989 Other problems related to housing and economic circumstances: Secondary | ICD-10-CM

## 2020-04-15 DIAGNOSIS — Z7409 Other reduced mobility: Secondary | ICD-10-CM

## 2020-04-15 DIAGNOSIS — Z09 Encounter for follow-up examination after completed treatment for conditions other than malignant neoplasm: Secondary | ICD-10-CM

## 2020-04-15 DIAGNOSIS — R531 Weakness: Secondary | ICD-10-CM

## 2020-04-15 DIAGNOSIS — J9620 Acute and chronic respiratory failure, unspecified whether with hypoxia or hypercapnia: Secondary | ICD-10-CM

## 2020-04-15 DIAGNOSIS — J441 Chronic obstructive pulmonary disease with (acute) exacerbation: Secondary | ICD-10-CM | POA: Diagnosis not present

## 2020-04-15 DIAGNOSIS — Z598 Other problems related to housing and economic circumstances: Secondary | ICD-10-CM

## 2020-04-15 DIAGNOSIS — R0602 Shortness of breath: Secondary | ICD-10-CM

## 2020-04-15 LAB — COMPLETE METABOLIC PANEL WITH GFR
AG Ratio: 1 (calc) (ref 1.0–2.5)
ALT: 19 U/L (ref 6–29)
AST: 13 U/L (ref 10–35)
Albumin: 3.6 g/dL (ref 3.6–5.1)
Alkaline phosphatase (APISO): 75 U/L (ref 37–153)
BUN/Creatinine Ratio: 11 (calc) (ref 6–22)
BUN: 11 mg/dL (ref 7–25)
CO2: 23 mmol/L (ref 20–32)
Calcium: 9.4 mg/dL (ref 8.6–10.4)
Chloride: 108 mmol/L (ref 98–110)
Creat: 1.02 mg/dL — ABNORMAL HIGH (ref 0.50–0.99)
GFR, Est African American: 67 mL/min/{1.73_m2} (ref >=60)
GFR, Est Non African American: 58 mL/min/{1.73_m2} — ABNORMAL LOW (ref >=60)
Globulin: 3.7 g/dL (calc) (ref 1.9–3.7)
Glucose, Bld: 91 mg/dL (ref 65–99)
Potassium: 4 mmol/L (ref 3.5–5.3)
Sodium: 139 mmol/L (ref 135–146)
Total Bilirubin: 0.3 mg/dL (ref 0.2–1.2)
Total Protein: 7.3 g/dL (ref 6.1–8.1)

## 2020-04-15 LAB — CBC WITH DIFFERENTIAL/PLATELET
Absolute Monocytes: 261 cells/uL (ref 200–950)
Basophils Absolute: 32 cells/uL (ref 0–200)
Basophils Relative: 0.7 %
Eosinophils Absolute: 140 cells/uL (ref 15–500)
Eosinophils Relative: 3.1 %
HCT: 36.4 % (ref 35.0–45.0)
Hemoglobin: 11.7 g/dL (ref 11.7–15.5)
Lymphs Abs: 1260 cells/uL (ref 850–3900)
MCH: 29.7 pg (ref 27.0–33.0)
MCHC: 32.1 g/dL (ref 32.0–36.0)
MCV: 92.4 fL (ref 80.0–100.0)
MPV: 10.2 fL (ref 7.5–12.5)
Monocytes Relative: 5.8 %
Neutro Abs: 2808 cells/uL (ref 1500–7800)
Neutrophils Relative %: 62.4 %
Platelets: 119 10*3/uL — ABNORMAL LOW (ref 140–400)
RBC: 3.94 10*6/uL (ref 3.80–5.10)
RDW: 13.6 % (ref 11.0–15.0)
Total Lymphocyte: 28 %
WBC: 4.5 10*3/uL (ref 3.8–10.8)

## 2020-04-15 MED ORDER — BENZONATATE 100 MG PO CAPS
100.0000 mg | ORAL_CAPSULE | Freq: Three times a day (TID) | ORAL | 0 refills | Status: DC | PRN
Start: 2020-04-15 — End: 2020-05-19

## 2020-04-15 MED ORDER — NEBULIZER/TUBING/MOUTHPIECE KIT: PACK | 0 refills | Status: DC
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
## 2020-04-15 NOTE — Patient Instructions (Signed)
Please sign for records from [facility] try and get the neb machine from medical supply store Do breathing treatments 2 to 3 times a day  If she is breathing quickly or feels short of breath, she needs to go back to the ER to get checked.  Try tessalon to sooth throat

## 2020-04-15 NOTE — Telephone Encounter (Signed)
Patient came for appointment. Office note faxed

## 2020-04-16 ENCOUNTER — Other Ambulatory Visit: Payer: Self-pay | Admitting: Family Medicine

## 2020-04-16 DIAGNOSIS — J449 Chronic obstructive pulmonary disease, unspecified: Secondary | ICD-10-CM

## 2020-04-16 DIAGNOSIS — J441 Chronic obstructive pulmonary disease with (acute) exacerbation: Secondary | ICD-10-CM

## 2020-04-16 MED ORDER — IPRATROPIUM-ALBUTEROL 0.5-2.5 (3) MG/3ML IN SOLN: 3.0000 mL | Freq: Four times a day (QID) | RESPIRATORY_TRACT | 1 refills | Status: DC | PRN

## 2020-04-16 NOTE — Telephone Encounter (Signed)
They told me that they had the vial and every but not the machine - it was on their discharge med list that we went over   I will send in refill   Duonebs to be done Q 6 hours PRN

## 2020-04-16 NOTE — Telephone Encounter (Signed)
Patient's husband requesting call back from clinical staff to discuss nebulizer. He states there was no solution that came with it. He also inquired about the frequency.

## 2020-04-20 ENCOUNTER — Telehealth: Payer: Self-pay | Admitting: *Deleted

## 2020-04-20 ENCOUNTER — Ambulatory Visit: Payer: Medicare Other | Admitting: *Deleted

## 2020-04-20 NOTE — Telephone Encounter (Signed)
   Chronic Care Management   Unsuccessful Call Note 04/20/2020 Name: Cynthia [name] MRN: [id number] DOB: 02-26-1956  Patient is a 64 year old female who sees Delsa Grana, [place] for primary care. Delsa Grana, PA-C asked the CCM team to consult the patient to assist with paperwork received from the Department of Health and Human Services.     This social worker was unable to reach patient via telephone today for initial call. I have left HIPAA compliant voicemail asking patient to return my call. (unsuccessful outreach #1).   Plan: Will follow-up within 7 business days via telephone.     Elliot Gurney, [place] Administrator, [facility] [phone]

## 2020-04-21 NOTE — Chronic Care Management (AMB) (Signed)
Chronic Care Management   Social Work Note  04/21/2020 Name: Cynthia [name] MRN: [id number] DOB: 1955/12/13  Cynthia [name] is a 64 y.o. year old female who sees Delsa Grana, [place] for primary care. The CCM team was consulted for assistance with [employer] .   Phone call to patient and patient's spouse to discuss community resource needs. Per patient's spouse, patient has a walker, wheelchair, and a 3 in 1.  Patient has [place] services through [facility]-PT started today. Patient receives mental health services through [facility]. Patient also receives food stamps.Patient did receive some paperwork from the Department of Health and [employer], however it was related to a level II PASRR result not medicare. Per patient's spouse, a Education officer, museum will be coming to his home to discuss enrollment in Medicare Part A. Per patient and his spouse, there are no additional community resource needs at this time.  SDOH (Social Determinants of Health) assessments performed: Yes SDOH Interventions     Most Recent Value  SDOH Interventions  SDOH Interventions for the Following Domains Depression  Depression Interventions/Treatment  Currently on Treatment        Outpatient Encounter Medications as of 04/20/2020  Medication Sig  . albuterol (PROAIR HFA) 108 (90 Base) MCG/ACT inhaler INHALE 2 PUFFS BY MOUTH EVERY 6 HOURS IFNEEDED FOR WHEEZING OR SHORTNESS OF BREATH. (Patient not taking: Reported on 04/15/2020)  . amantadine (SYMMETREL) 100 MG capsule Take 1 capsule (100 mg total) by mouth daily. (Patient not taking: Reported on 04/15/2020)  . amLODipine (NORVASC) 5 MG tablet Take 1 tablet (5 mg total) by mouth daily.  Marland Kitchen aspirin EC 81 MG tablet Take 1 tablet (81 mg total) by mouth daily.  Marland Kitchen atorvastatin (LIPITOR) 20 MG tablet Take 1 tablet (20 mg total) by mouth at bedtime.  . benzonatate (TESSALON) 100 MG capsule Take 1 capsule (100 mg total) by mouth 3 (three) times daily as needed  for cough.  . clopidogrel (PLAVIX) 75 MG tablet Take 1 tablet (75 mg total) by mouth daily.  . famotidine (PEPCID) 20 MG tablet Take 1 tablet (20 mg total) by mouth 2 (two) times daily as needed for heartburn (allergic reachtion/hives).  . feeding supplement, ENSURE ENLIVE, (ENSURE ENLIVE) LIQD Take 237 mLs by mouth 2 (two) times daily between meals.  . folic acid (FOLVITE) 1 MG tablet Take 1 tablet (1 mg total) by mouth daily.  Marland Kitchen gabapentin (NEURONTIN) 300 MG capsule Take 1 capsule (300 mg total) by mouth daily.  . Glycopyrrolate-Formoterol (BEVESPI AEROSPHERE) 9-4.8 MCG/ACT AERO Inhale 2 puffs into the lungs 2 (two) times daily. (Patient taking differently: Inhale 2 puffs into the lungs 2 (two) times daily as needed (respiratory issues.). )  . hydrOXYzine (ATARAX/VISTARIL) 25 MG tablet Take 1-2 tablets (25-50 mg total) by mouth 3 (three) times daily as needed for itching.  Marland Kitchen ipratropium-albuterol (DUONEB) 0.5-2.5 (3) MG/3ML SOLN Take 3 mLs by nebulization every 6 (six) hours as needed (for SOB, cough, chest tightness).  Marland Kitchen lithium carbonate 300 MG capsule Take 1 capsule (300 mg total) by mouth 2 (two) times daily with a meal.  . losartan (COZAAR) 50 MG tablet Take 1 tablet (50 mg total) by mouth daily.  . Multiple Vitamin (MULTIVITAMIN WITH MINERALS) TABS tablet Take 1 tablet by mouth daily.  . nicotine (NICODERM CQ - DOSED IN MG/24 HOURS) 21 mg/24hr patch Place 1 patch (21 mg total) onto the skin daily.  . nitroGLYCERIN (NITROSTAT) 0.4 MG SL tablet Place 1 tablet (0.4 mg total) under  the tongue every 5 (five) minutes as needed for chest pain. Maximum of 3 pills; call 911 at first sign of chest pain  . Oxcarbazepine (TRILEPTAL) 300 MG tablet Take 1 tablet (300 mg total) by mouth at bedtime.  Marland Kitchen QUEtiapine (SEROQUEL) 100 MG tablet Take half tablet in the morning and 1 tablet at night (Patient taking differently: Take 50 mg by mouth 2 (two) times daily. Take half tablet in the morning and 1 tablet at  night)  . Respiratory Therapy Supplies (NEBULIZER/TUBING/MOUTHPIECE) KIT Disp one nebulizer machine, tubing set and mouthpiece kit  . Respiratory Therapy Supplies (NEBULIZER/TUBING/MOUTHPIECE) KIT Disp one nebulizer machine, tubing set and mouthpiece kit  . varenicline (CHANTIX STARTING MONTH PAK) 0.5 MG X 11 & 1 MG X 42 tablet Take 0.5 mg tablet by mouth 1x daily for 3 days, then increase to 0.5 mg tablet 2x daily for 4 days, then increase to 1 mg tablet 2x daily.  . vitamin C (ASCORBIC ACID) 500 MG tablet Take 1 tablet (500 mg total) by mouth daily.   No facility-administered encounter medications on file as of 04/20/2020.    Goals Addressed   None     Follow Up Plan: Patient will call this social worker with any additional community resource needs that may arise in the future  Thor, [place] Worker  [facility] [phone]

## 2020-04-25 ENCOUNTER — Other Ambulatory Visit (INDEPENDENT_AMBULATORY_CARE_PROVIDER_SITE_OTHER): Payer: Self-pay | Admitting: Vascular Surgery

## 2020-04-27 NOTE — Telephone Encounter (Signed)
Charlene Huntson for [facility] home health called in and stated that pt was not available 3 days last week and missed the visit but needs to be seen today.  She is need a verbal to be able to go out today  Best number [phone]

## 2020-04-27 NOTE — Telephone Encounter (Signed)
Left vm with nurse

## 2020-04-28 DIAGNOSIS — I69998 Other sequelae following unspecified cerebrovascular disease: Secondary | ICD-10-CM | POA: Insufficient documentation

## 2020-05-08 ENCOUNTER — Telehealth: Payer: Self-pay

## 2020-05-08 NOTE — Telephone Encounter (Signed)
Can you do this without visit? Or does she need sepertae visit to address this in note so ins will cover?

## 2020-05-08 NOTE — Telephone Encounter (Signed)
Copied from [place] [id number]. Topic: General - Other >> May 08, 2020 11:39 AM Leward Quan A wrote: Reason for CRM: Patient husband called to request orders for a wheel chair. States that he was told by medicare to contact her PCP and get the Rx and take it to the medical supply store. Please advise Ph# [phone]

## 2020-05-11 ENCOUNTER — Other Ambulatory Visit: Payer: Self-pay | Admitting: Emergency Medicine

## 2020-05-11 MED ORDER — MISC. DEVICES KIT: PACK | 0 refills | Status: DC

## 2020-05-11 NOTE — Telephone Encounter (Signed)
Patient husband called about the Rx requested asking for Delsa Grana nurse to give him a call today please Ph#  [phone]

## 2020-05-11 NOTE — Telephone Encounter (Signed)
Left message for patient to call office back.

## 2020-05-11 NOTE — Telephone Encounter (Signed)
Need order for wheelchair. Medicare will pay for it. Please fax to [facility]

## 2020-05-18 ENCOUNTER — Telehealth: Payer: Self-pay | Admitting: Family Medicine

## 2020-05-18 NOTE — Telephone Encounter (Signed)
[facility] called and stated that 7.21.21 was the DOS that a nebulizer was ordered but the OV notes are needed for that visit /fax to [phone]

## 2020-05-19 ENCOUNTER — Ambulatory Visit (INDEPENDENT_AMBULATORY_CARE_PROVIDER_SITE_OTHER): Payer: Medicare Other | Admitting: Family Medicine

## 2020-05-19 ENCOUNTER — Ambulatory Visit: Payer: Medicare Other

## 2020-05-19 ENCOUNTER — Encounter: Payer: Self-pay | Admitting: Family Medicine

## 2020-05-19 ENCOUNTER — Other Ambulatory Visit: Payer: Self-pay

## 2020-05-19 VITALS — BP 134/78 | HR 74 | Temp 97.7°F | Resp 18 | Ht 64.0 in | Wt 141.4 lb

## 2020-05-19 DIAGNOSIS — J449 Chronic obstructive pulmonary disease, unspecified: Secondary | ICD-10-CM | POA: Diagnosis not present

## 2020-05-19 DIAGNOSIS — R600 Localized edema: Secondary | ICD-10-CM

## 2020-05-19 DIAGNOSIS — Z5181 Encounter for therapeutic drug level monitoring: Secondary | ICD-10-CM

## 2020-05-19 DIAGNOSIS — Z7409 Other reduced mobility: Secondary | ICD-10-CM | POA: Diagnosis not present

## 2020-05-19 DIAGNOSIS — R296 Repeated falls: Secondary | ICD-10-CM

## 2020-05-19 DIAGNOSIS — I739 Peripheral vascular disease, unspecified: Secondary | ICD-10-CM

## 2020-05-19 DIAGNOSIS — I10 Essential (primary) hypertension: Secondary | ICD-10-CM

## 2020-05-19 DIAGNOSIS — E785 Hyperlipidemia, unspecified: Secondary | ICD-10-CM

## 2020-05-19 DIAGNOSIS — R269 Unspecified abnormalities of gait and mobility: Secondary | ICD-10-CM

## 2020-05-19 DIAGNOSIS — Z789 Other specified health status: Secondary | ICD-10-CM

## 2020-05-19 DIAGNOSIS — D696 Thrombocytopenia, unspecified: Secondary | ICD-10-CM

## 2020-05-19 MED ORDER — BENZONATATE 100 MG PO CAPS: 100.0000 mg | ORAL_CAPSULE | Freq: Three times a day (TID) | ORAL | 0 refills | Status: DC | PRN
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
## 2020-05-19 NOTE — Patient Instructions (Addendum)
Use tessalon perles for cough, and decrease how much you use duoneb nebulizer treatments- they can cause side effect of tremor or jitters  Always use your walker unless

## 2020-05-19 NOTE — Telephone Encounter (Signed)
Ov note from 7/21 faxed

## 2020-05-20 ENCOUNTER — Ambulatory Visit
Admission: RE | Admit: 2020-05-20 | Discharge: 2020-05-20 | Disposition: A | Discharge status: Home / Self Care | Payer: Medicare Other | Source: Ambulatory Visit | Attending: Family Medicine | Admitting: Family Medicine

## 2020-05-20 ENCOUNTER — Other Ambulatory Visit: Payer: Self-pay

## 2020-05-20 DIAGNOSIS — R600 Localized edema: Secondary | ICD-10-CM | POA: Insufficient documentation

## 2020-05-20 LAB — COMPLETE METABOLIC PANEL WITH GFR
AG Ratio: 1.3 (calc) (ref 1.0–2.5)
ALT: 9 U/L (ref 6–29)
AST: 14 U/L (ref 10–35)
Albumin: 4.5 g/dL (ref 3.6–5.1)
Alkaline phosphatase (APISO): 55 U/L (ref 37–153)
BUN/Creatinine Ratio: 11 (calc) (ref 6–22)
BUN: 12 mg/dL (ref 7–25)
CO2: 21 mmol/L (ref 20–32)
Calcium: 9.7 mg/dL (ref 8.6–10.4)
Chloride: 104 mmol/L (ref 98–110)
Creat: 1.12 mg/dL — ABNORMAL HIGH (ref 0.50–0.99)
GFR, Est African American: 60 mL/min/{1.73_m2} (ref >=60)
GFR, Est Non African American: 52 mL/min/{1.73_m2} — ABNORMAL LOW (ref >=60)
Globulin: 3.5 g/dL (calc) (ref 1.9–3.7)
Glucose, Bld: 110 mg/dL — ABNORMAL HIGH (ref 65–99)
Potassium: 3.8 mmol/L (ref 3.5–5.3)
Sodium: 137 mmol/L (ref 135–146)
Total Bilirubin: 0.5 mg/dL (ref 0.2–1.2)
Total Protein: 8 g/dL (ref 6.1–8.1)

## 2020-05-20 LAB — CBC WITH DIFFERENTIAL/PLATELET
Absolute Monocytes: 299 cells/uL (ref 200–950)
Basophils Absolute: 82 cells/uL (ref 0–200)
Basophils Relative: 1.2 %
Eosinophils Absolute: 238 cells/uL (ref 15–500)
Eosinophils Relative: 3.5 %
HCT: 40.9 % (ref 35.0–45.0)
Hemoglobin: 13.3 g/dL (ref 11.7–15.5)
Lymphs Abs: 1986 cells/uL (ref 850–3900)
MCH: 30.2 pg (ref 27.0–33.0)
MCHC: 32.5 g/dL (ref 32.0–36.0)
MCV: 92.7 fL (ref 80.0–100.0)
MPV: 10.7 fL (ref 7.5–12.5)
Monocytes Relative: 4.4 %
Neutro Abs: 4196 cells/uL (ref 1500–7800)
Neutrophils Relative %: 61.7 %
Platelets: 153 10*3/uL (ref 140–400)
RBC: 4.41 10*6/uL (ref 3.80–5.10)
RDW: 12.5 % (ref 11.0–15.0)
Total Lymphocyte: 29.2 %
WBC: 6.8 10*3/uL (ref 3.8–10.8)

## 2020-05-20 NOTE — Telephone Encounter (Signed)
FYI: Ultrasound results in chart and negative

## 2020-06-02 ENCOUNTER — Ambulatory Visit: Payer: Medicare Other | Admitting: Primary Care
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
## 2020-06-16 ENCOUNTER — Encounter: Payer: Medicare Other | Admitting: Obstetrics and Gynecology

## 2020-07-07 ENCOUNTER — Encounter: Payer: Medicare Other | Admitting: Obstetrics and Gynecology

## 2020-07-13 ENCOUNTER — Other Ambulatory Visit: Payer: Self-pay | Admitting: *Deleted

## 2020-07-13 ENCOUNTER — Other Ambulatory Visit: Payer: Self-pay

## 2020-07-13 NOTE — Patient Outreach (Signed)
Care Coordination - Case Manager  07/13/2020  Cynthia [name] 02-19-1956 [id number]  Subjective:  Cynthia [name] is an 64 y.o. year old female who is a primary patient of Cynthia [name], [place].  [name] was given information about Medicaid Managed Care team care coordination services today. [name] agreed to services and verbal consent obtained  Review of patient status, laboratory and other test data was performed as part of evaluation for provision of services.  SDOH: SDOH Screenings   Alcohol Screen: Low Risk   . Last Alcohol Screening Score (AUDIT): 0  Depression (PHQ2-9): Low Risk   . PHQ-2 Score: 4  Financial Resource Strain:   . Difficulty of Paying Living Expenses: Not on file  Food Insecurity: No Food Insecurity  . Worried About Charity fundraiser in the Last Year: Never true  . Ran Out of Food in the Last Year: Never true  Housing: Low Risk   . Last Housing Risk Score: 0  Physical Activity: Insufficiently Active  . Days of Exercise per Week: 7 days  . Minutes of Exercise per Session: 10 min  Social Connections: Moderately Integrated  . Frequency of Communication with Friends and Family: Once a week  . Frequency of Social Gatherings with Friends and Family: Never  . Attends Religious Services: 1 to 4 times per year  . Active Member of Clubs or Organizations: No  . Attends Archivist Meetings: 1 to 4 times per year  . Marital Status: Married  Stress: No Stress Concern Present  . Feeling of Stress : Not at all  Tobacco Use: Medium Risk  . Smoking Tobacco Use: Former Smoker  . Smokeless Tobacco Use: Never Used  Transportation Needs: No Transportation Needs  . Lack of Transportation (Medical): No  . Lack of Transportation (Non-Medical): No     Objective:    Allergies  Allergen Reactions  . Morphine Anaphylaxis    Cardiac arrest    Medications:    Medications Reviewed Today    Reviewed by Cynthia Grana, PA-C (Physician Assistant  Certified) on 16/38/45 at 1205  Med List Status: <None>  Medication Order Taking? Sig Documenting Provider Last Dose Status Informant  albuterol (PROAIR HFA) 108 (90 Base) MCG/ACT inhaler [id number] No INHALE 2 PUFFS BY MOUTH EVERY 6 HOURS IFNEEDED FOR WHEEZING OR SHORTNESS OF BREATH.  Patient not taking: Reported on 04/15/2020   Cynthia Grana, PA-C Not Taking Active Other  amantadine (SYMMETREL) 100 MG capsule [id number] No Take 1 capsule (100 mg total) by mouth daily.  Patient not taking: Reported on 04/15/2020   [place] Crane, MD Not Taking Active Other  amLODipine (NORVASC) 5 MG tablet [id number] Yes Take 1 tablet (5 mg total) by mouth daily. Lorella Nimrod, MD Taking Active   aspirin EC 81 MG tablet [id number] Yes Take 1 tablet (81 mg total) by mouth daily. Arnetha Courser, MD Taking Active Other  atorvastatin (LIPITOR) 20 MG tablet [id number] Yes Take 1 tablet (20 mg total) by mouth at bedtime. Cynthia Grana, PA-C Taking Active Other  benzonatate (TESSALON) 100 MG capsule [id number] Yes Take 1 capsule (100 mg total) by mouth 3 (three) times daily as needed for cough. Cynthia Grana, PA-C  Active   clopidogrel (PLAVIX) 75 MG tablet [id number] Yes TAKE 1 TABLET BY MOUTH ONCE DAILY Schnier, Dolores Lory, MD Taking Active   famotidine (PEPCID) 20 MG tablet [id number] Yes Take 1 tablet (20 mg total) by mouth 2 (two) times daily as needed for heartburn (allergic reachtion/hives). Cynthia Grana, PA-C  Taking Active Other  feeding supplement, ENSURE ENLIVE, (ENSURE ENLIVE) LIQD [id number] Yes Take 237 mLs by mouth 2 (two) times daily between meals. Lorella Nimrod, MD Taking Active   folic acid (FOLVITE) 1 MG tablet [id number] Yes Take 1 tablet (1 mg total) by mouth daily. Lorella Nimrod, MD Taking Active   gabapentin (NEURONTIN) 300 MG capsule [id number] Yes Take 1 capsule (300 mg total) by mouth daily. [place] Crane, MD Taking Active Other  Glycopyrrolate-Formoterol (BEVESPI AEROSPHERE) 9-4.8 MCG/ACT AERO [id number] Yes  Inhale 2 puffs into the lungs 2 (two) times daily.  Patient taking differently: Inhale 2 puffs into the lungs 2 (two) times daily as needed (respiratory issues.).    Cynthia Grana, PA-C Taking Active Other  hydrOXYzine (ATARAX/VISTARIL) 25 MG tablet [id number] Yes Take 1-2 tablets (25-50 mg total) by mouth 3 (three) times daily as needed for itching. Cynthia Grana, PA-C Taking Active Other  ipratropium-albuterol (DUONEB) 0.5-2.5 (3) MG/3ML SOLN [id number] Yes Take 3 mLs by nebulization every 6 (six) hours as needed (for SOB, cough, chest tightness). Cynthia Grana, PA-C Taking Active   lithium carbonate 300 MG capsule [id number] Yes Take 1 capsule (300 mg total) by mouth 2 (two) times daily with a meal. [place] Crane, MD Taking Active Other  losartan (COZAAR) 50 MG tablet [id number] Yes Take 1 tablet (50 mg total) by mouth daily. Cynthia Grana, PA-C Taking Active Other  Misc. Devices KIT [id number] Yes Manual wheelchair   Bilateral leg weakness Codes: R29.898  Impaired mobility and ADLs Codes: Z74.09, Z78.9 Generalized weakness Codes: R53.1 Abnormality of gait Codes: R26.9 Cynthia Grana, PA-C Taking Active   Multiple Vitamin (MULTIVITAMIN WITH MINERALS) TABS tablet [id number] Yes Take 1 tablet by mouth daily. Lorella Nimrod, MD Taking Active   nicotine (NICODERM CQ - DOSED IN MG/24 HOURS) 21 mg/24hr patch [id number] Yes Place 1 patch (21 mg total) onto the skin daily. Lorella Nimrod, MD Taking Active   nitroGLYCERIN (NITROSTAT) 0.4 MG SL tablet [id number] Yes Place 1 tablet (0.4 mg total) under the tongue every 5 (five) minutes as needed for chest pain. Maximum of 3 pills; call 911 at first sign of chest pain Lada, Satira Anis, MD Taking Active Other  Oxcarbazepine (TRILEPTAL) 300 MG tablet [id number] Yes Take 1 tablet (300 mg total) by mouth at bedtime. [place] Crane, MD Taking Active Other  QUEtiapine (SEROQUEL) 100 MG tablet [id number] Yes Take half tablet in the morning and 1 tablet at night  Patient taking  differently: Take 50 mg by mouth 2 (two) times daily. Take half tablet in the morning and 1 tablet at night   [place] Crane, MD Taking Active Other  Respiratory Therapy Supplies (NEBULIZER/TUBING/MOUTHPIECE) KIT [id number] Yes Disp one nebulizer machine, tubing set and mouthpiece kit Cynthia Grana, PA-C Taking Active Other  Respiratory Therapy Supplies (NEBULIZER/TUBING/MOUTHPIECE) KIT [id number] Yes Disp one nebulizer machine, tubing set and mouthpiece kit Cynthia Grana, PA-C Taking Active   varenicline (CHANTIX STARTING MONTH PAK) 0.5 MG X 11 & 1 MG X 42 tablet [id number] Yes Take 0.5 mg tablet by mouth 1x daily for 3 days, then increase to 0.5 mg tablet 2x daily for 4 days, then increase to 1 mg tablet 2x daily. Cynthia Grana, PA-C Taking Active Other  vitamin C (ASCORBIC ACID) 500 MG tablet [id number] Yes Take 1 tablet (500 mg total) by mouth daily. Fredderick Severance, NP Taking Active Other          Assessment:   Goals Addressed  This Visit's Progress   . Continued Health Improvement       CARE PLAN ENTRY Medicaid Managed Care (see longitudinal plan of care for additional care plan information)  Current Barriers:  . Care Coordination needs related to COPD. Patients caregiver, expresses desire for patient to attend physical therapy to increase her strength and mobility  Nurse Case Manager Clinical Goal(s):  Marland Kitchen Over the next 30 days, patient will verbalize understanding of plan for Physical therapy . Over the next 30 days, patient will work with physical therapist to address needs related to increasing strength and mobility . Over the next 90 days, patient will meet with RN Care Manager to address care coordination needs  Interventions:  . Inter-disciplinary care team collaboration (see longitudinal plan of care) . Evaluation of current treatment plan related to COPD and patient's adherence to plan as established by provider. . Discussed plans with patient's caregiver for  ongoing care management follow up and provided patient with direct contact information for care management team . Reviewed scheduled/upcoming provider appointments including:Physical Therapy this week, Pulmonary on 07/21/20 and PCP on 08/27/20  Plan:  . Patient will attend all upcoming appointments . RNCM will follow up within 30 days for a telephone visit   Initial goal documentation        Plan: RNCM will follow up with a telephone visit 08/13/20 @ [place] RN, [place] RN Care Coordinator

## 2020-07-13 NOTE — Patient Instructions (Signed)
Visit Information  Thank you for taking the time to speak with me today. I will follow up with you as we discussed.  Cynthia [name] was given information about Medicaid Managed Care team care coordination services as a part of their [facility] Medicaid benefit. Cynthia [name] verbally consented to engagement with the [facility] Managed Care team.   For questions related to your [facility], please call: [phone] or visit the homepage here: [email or web address]  If you would like to schedule transportation through your [facility], please call the following number at least 2 days in advance of your appointment: [phone]  Goals Addressed            This Visit's Progress   . Continued Health Improvement       CARE PLAN ENTRY Medicaid Managed Care (see longitudinal plan of care for additional care plan information)  Current Barriers:  . Care Coordination needs related to COPD. Patients caregiver, expresses desire for patient to attend physical therapy to increase her strength and mobility  Nurse Case Manager Clinical Goal(s):  Marland Kitchen Over the next 30 days, patient will verbalize understanding of plan for Physical therapy . Over the next 30 days, patient will work with physical therapist to address needs related to increasing strength and mobility . Over the next 90 days, patient will meet with RN Care Manager to address care coordination needs  Interventions:  . Inter-disciplinary care team collaboration (see longitudinal plan of care) . Evaluation of current treatment plan related to COPD and patient's adherence to plan as established by provider. . Discussed plans with patient's caregiver for ongoing care management follow up and provided patient with direct contact information for care management team . Reviewed scheduled/upcoming provider appointments including:Physical  Therapy this week, Pulmonary on 07/21/20 and PCP on 08/27/20  Plan:  . Patient will attend all upcoming appointments . RNCM will follow up within 30 days for a telephone visit   Initial goal documentation        The patient has been provided with contact information for the Managed Medicaid care management team and has been advised to call with any health related questions or concerns.  Telephone follow up appointment with Managed Medicaid care management team member scheduled for:08/13/20 @ [place] RN, [place] RN Care Coordinator

## 2020-07-14 ENCOUNTER — Other Ambulatory Visit: Payer: Self-pay | Admitting: Psychiatry

## 2020-07-14 DIAGNOSIS — F3178 Bipolar disorder, in full remission, most recent episode mixed: Secondary | ICD-10-CM

## 2020-07-15 ENCOUNTER — Encounter: Payer: Self-pay | Admitting: *Deleted

## 2020-07-15 NOTE — Progress Notes (Signed)
This encounter was created in error - please disregard.

## 2020-07-21 ENCOUNTER — Ambulatory Visit: Payer: Medicare Other | Admitting: Pulmonary Disease

## 2020-07-21 ENCOUNTER — Other Ambulatory Visit: Payer: Self-pay | Admitting: Psychiatry

## 2020-07-21 DIAGNOSIS — F3160 Bipolar disorder, current episode mixed, unspecified: Secondary | ICD-10-CM

## 2020-07-22 ENCOUNTER — Ambulatory Visit (INDEPENDENT_AMBULATORY_CARE_PROVIDER_SITE_OTHER): Payer: Medicare Other | Admitting: Family Medicine

## 2020-07-22 ENCOUNTER — Encounter: Payer: Self-pay | Admitting: Family Medicine

## 2020-07-22 ENCOUNTER — Other Ambulatory Visit: Payer: Self-pay

## 2020-07-22 VITALS — BP 130/72 | HR 95 | Temp 98.7°F | Resp 18 | Ht 64.0 in | Wt 141.9 lb

## 2020-07-22 DIAGNOSIS — R269 Unspecified abnormalities of gait and mobility: Secondary | ICD-10-CM | POA: Diagnosis not present

## 2020-07-22 DIAGNOSIS — R413 Other amnesia: Secondary | ICD-10-CM

## 2020-07-22 DIAGNOSIS — J449 Chronic obstructive pulmonary disease, unspecified: Secondary | ICD-10-CM | POA: Diagnosis not present

## 2020-07-22 DIAGNOSIS — R29898 Other symptoms and signs involving the musculoskeletal system: Secondary | ICD-10-CM

## 2020-07-22 DIAGNOSIS — F319 Bipolar disorder, unspecified: Secondary | ICD-10-CM | POA: Diagnosis not present

## 2020-07-22 DIAGNOSIS — M25559 Pain in unspecified hip: Secondary | ICD-10-CM

## 2020-07-22 DIAGNOSIS — G40209 Localization-related (focal) (partial) symptomatic epilepsy and epileptic syndromes with complex partial seizures, not intractable, without status epilepticus: Secondary | ICD-10-CM

## 2020-07-22 DIAGNOSIS — S069X9S Unspecified intracranial injury with loss of consciousness of unspecified duration, sequela: Secondary | ICD-10-CM

## 2020-07-22 DIAGNOSIS — I480 Paroxysmal atrial fibrillation: Secondary | ICD-10-CM

## 2020-07-22 DIAGNOSIS — G8929 Other chronic pain: Secondary | ICD-10-CM

## 2020-07-22 DIAGNOSIS — Z87891 Personal history of nicotine dependence: Secondary | ICD-10-CM

## 2020-07-22 DIAGNOSIS — I10 Essential (primary) hypertension: Secondary | ICD-10-CM

## 2020-07-22 MED ORDER — OXCARBAZEPINE 300 MG PO TABS: 300.0000 mg | ORAL_TABLET | Freq: Every day | ORAL | 0 refills | Status: DC

## 2020-07-22 MED ORDER — TRELEGY ELLIPTA 100-62.5-25 MCG/INH IN AEPB: 1.0000 | INHALATION_SPRAY | Freq: Every day | RESPIRATORY_TRACT | 11 refills | Status: DC

## 2020-07-22 MED ORDER — LITHIUM CARBONATE 300 MG PO CAPS: 300.0000 mg | ORAL_CAPSULE | Freq: Two times a day (BID) | ORAL | 0 refills | Status: DC

## 2020-07-22 MED ORDER — QUETIAPINE FUMARATE 100 MG PO TABS: ORAL_TABLET | ORAL | 0 refills | Status: DC

## 2020-07-22 MED ORDER — AMANTADINE HCL 100 MG PO CAPS: 100.0000 mg | ORAL_CAPSULE | Freq: Every day | ORAL | 0 refills | Status: DC
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
## 2020-07-29 ENCOUNTER — Telehealth: Payer: Self-pay | Admitting: Family Medicine

## 2020-07-29 DIAGNOSIS — F3178 Bipolar disorder, in full remission, most recent episode mixed: Secondary | ICD-10-CM

## 2020-07-29 DIAGNOSIS — S069X5S Unspecified intracranial injury with loss of consciousness greater than 24 hours with return to pre-existing conscious level, sequela: Secondary | ICD-10-CM

## 2020-07-29 DIAGNOSIS — R413 Other amnesia: Secondary | ICD-10-CM

## 2020-07-29 NOTE — Telephone Encounter (Signed)
Psychiatry referral refused and closed Pt was a pt of Dr. Toy Care - trying to help pt get back into see her Staff asked to call pt's husband with Dr. Starleen Arms current [place]- clinic administrator was also going to inquire with [facility] to see if Dr. Starleen Arms pt are freely able to come there or anything else we need to do to help with continuity of care  Delsa Grana, PA-C

## 2020-07-30 NOTE — Addendum Note (Signed)
Addended by: Delsa Grana on: 07/30/2020 06:30 PM   Modules accepted: Orders

## 2020-07-30 NOTE — Telephone Encounter (Signed)
Please advise 

## 2020-07-30 NOTE — Telephone Encounter (Signed)
Per referral note from [facility]:  Please try one of the following:  [facility] [phone]  [facility] [place] [facility] [phone]  [employer] [place] [phone]

## 2020-07-30 NOTE — Telephone Encounter (Signed)
Spoke with receptionist at [facility] and was informed that they only see patients that live in [facility] and that are uninsured or have [employer].

## 2020-08-13 ENCOUNTER — Other Ambulatory Visit: Payer: Self-pay | Admitting: *Deleted

## 2020-08-13 NOTE — Patient Instructions (Signed)
Visit Information  Ms. Cynthia [name]  - as a part of your Medicaid benefit, you are eligible for care management and care coordination services at no cost or copay. I was unable to reach you by phone today but would be happy to help you with your health related needs. Please feel free to call me @ [phone].   A member of the Managed Medicaid care management team will reach out to you again over the next 7-14 days.   Lurena Joiner RN, BSN [place]  [employer] manager

## 2020-08-13 NOTE — Patient Outreach (Signed)
Care Coordination  08/13/2020  Cynthia [name] 24-Oct-1955 [id number]   An unsuccessful telephone outreach was attempted today. The patient was referred to the case management team for assistance with care management and care coordination.   Follow Up Plan: A HIPAA compliant phone message was left for the patient providing contact information and requesting a return call.  The Managed Medicaid care management team will reach out to the patient again over the next 7-14 days.   Lurena Joiner RN, BSN [place]  [employer] manager

## 2020-08-17 ENCOUNTER — Other Ambulatory Visit: Payer: Self-pay | Admitting: Psychiatry

## 2020-08-17 ENCOUNTER — Other Ambulatory Visit: Payer: Self-pay | Admitting: Family Medicine

## 2020-08-17 DIAGNOSIS — F3178 Bipolar disorder, in full remission, most recent episode mixed: Secondary | ICD-10-CM

## 2020-08-17 DIAGNOSIS — F319 Bipolar disorder, unspecified: Secondary | ICD-10-CM

## 2020-08-18 NOTE — Telephone Encounter (Signed)
LVM to inform patient's husband Mr. Torpey that a one month supply of requested medication was sent to patient's pharmacy on yesterday and just as a reminder that this will be last refill from provider Delsa Grana for the requested medications.  Patient must go to [facility] or [facility] if unable to see Dr. Toy Care.

## 2020-08-27 ENCOUNTER — Other Ambulatory Visit: Payer: Self-pay

## 2020-08-27 ENCOUNTER — Encounter: Payer: Self-pay | Admitting: Family Medicine

## 2020-08-27 ENCOUNTER — Ambulatory Visit (INDEPENDENT_AMBULATORY_CARE_PROVIDER_SITE_OTHER): Payer: Medicare Other | Admitting: Family Medicine

## 2020-08-27 VITALS — BP 118/74 | HR 97 | Temp 97.9°F | Resp 16 | Ht 64.0 in | Wt 145.9 lb

## 2020-08-27 DIAGNOSIS — J449 Chronic obstructive pulmonary disease, unspecified: Secondary | ICD-10-CM

## 2020-08-27 DIAGNOSIS — Z1231 Encounter for screening mammogram for malignant neoplasm of breast: Secondary | ICD-10-CM

## 2020-08-27 DIAGNOSIS — I70223 Atherosclerosis of native arteries of extremities with rest pain, bilateral legs: Secondary | ICD-10-CM | POA: Diagnosis not present

## 2020-08-27 DIAGNOSIS — E782 Mixed hyperlipidemia: Secondary | ICD-10-CM

## 2020-08-27 DIAGNOSIS — I739 Peripheral vascular disease, unspecified: Secondary | ICD-10-CM | POA: Diagnosis not present

## 2020-08-27 DIAGNOSIS — I10 Essential (primary) hypertension: Secondary | ICD-10-CM

## 2020-08-27 DIAGNOSIS — Z5181 Encounter for therapeutic drug level monitoring: Secondary | ICD-10-CM

## 2020-08-27 DIAGNOSIS — Z780 Asymptomatic menopausal state: Secondary | ICD-10-CM

## 2020-08-27 DIAGNOSIS — Z23 Encounter for immunization: Secondary | ICD-10-CM | POA: Diagnosis not present

## 2020-08-27 DIAGNOSIS — I480 Paroxysmal atrial fibrillation: Secondary | ICD-10-CM | POA: Diagnosis not present

## 2020-08-27 DIAGNOSIS — Z7902 Long term (current) use of antithrombotics/antiplatelets: Secondary | ICD-10-CM
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
## 2020-08-27 NOTE — Patient Instructions (Signed)
[place] [phone]

## 2020-08-28 LAB — CBC WITH DIFFERENTIAL/PLATELET
Absolute Monocytes: 270 cells/uL (ref 200–950)
Basophils Absolute: 90 cells/uL (ref 0–200)
Basophils Relative: 1.5 %
Eosinophils Absolute: 240 cells/uL (ref 15–500)
Eosinophils Relative: 4 %
HCT: 40.1 % (ref 35.0–45.0)
Hemoglobin: 13.2 g/dL (ref 11.7–15.5)
Lymphs Abs: 1896 cells/uL (ref 850–3900)
MCH: 30.4 pg (ref 27.0–33.0)
MCHC: 32.9 g/dL (ref 32.0–36.0)
MCV: 92.4 fL (ref 80.0–100.0)
MPV: 10.7 fL (ref 7.5–12.5)
Monocytes Relative: 4.5 %
Neutro Abs: 3504 cells/uL (ref 1500–7800)
Neutrophils Relative %: 58.4 %
Platelets: 159 10*3/uL (ref 140–400)
RBC: 4.34 10*6/uL (ref 3.80–5.10)
RDW: 12.4 % (ref 11.0–15.0)
Total Lymphocyte: 31.6 %
WBC: 6 10*3/uL (ref 3.8–10.8)

## 2020-08-28 LAB — COMPLETE METABOLIC PANEL WITH GFR
AG Ratio: 1.5 (calc) (ref 1.0–2.5)
ALT: 11 U/L (ref 6–29)
AST: 16 U/L (ref 10–35)
Albumin: 4.6 g/dL (ref 3.6–5.1)
Alkaline phosphatase (APISO): 58 U/L (ref 37–153)
BUN/Creatinine Ratio: 12 (calc) (ref 6–22)
BUN: 14 mg/dL (ref 7–25)
CO2: 16 mmol/L — ABNORMAL LOW (ref 20–32)
Calcium: 9.8 mg/dL (ref 8.6–10.4)
Chloride: 108 mmol/L (ref 98–110)
Creat: 1.15 mg/dL — ABNORMAL HIGH (ref 0.50–0.99)
GFR, Est African American: 58 mL/min/{1.73_m2} — ABNORMAL LOW (ref >=60)
GFR, Est Non African American: 50 mL/min/{1.73_m2} — ABNORMAL LOW (ref >=60)
Globulin: 3.1 g/dL (calc) (ref 1.9–3.7)
Glucose, Bld: 155 mg/dL — ABNORMAL HIGH (ref 65–99)
Potassium: 4.3 mmol/L (ref 3.5–5.3)
Sodium: 139 mmol/L (ref 135–146)
Total Bilirubin: 0.4 mg/dL (ref 0.2–1.2)
Total Protein: 7.7 g/dL (ref 6.1–8.1)

## 2020-09-01 IMAGING — CT CT CHEST LUNG CANCER SCREENING LOW DOSE W/O CM
2 of 5 series · 15 of 40 positions shown, 18 images · non-contrast
Comparison: Low-dose lung cancer screening CT chest dated
11/15/2018

CLINICAL DATA: 63-year-old female current smoker, with 87 pack-year
history of smoking, for follow-up lung cancer screening

EXAM:
CT CHEST WITHOUT CONTRAST LOW-DOSE FOR LUNG CANCER SCREENING
TECHNIQUE: Multidetector CT imaging of the chest was performed following the
standard protocol without IV contrast.

[Series 3: lung 1.00 · axial · 0.65mm/px · z∈[-1167,-887]mm · 12 of 310 slices shown, 15 images]
[im 15/310  mediastinal]
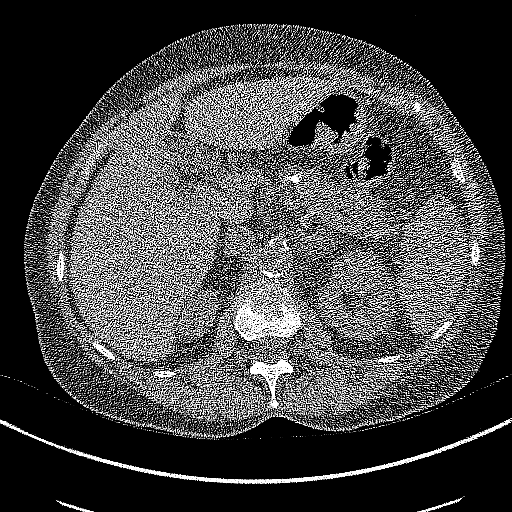
[im 15/310  lung]
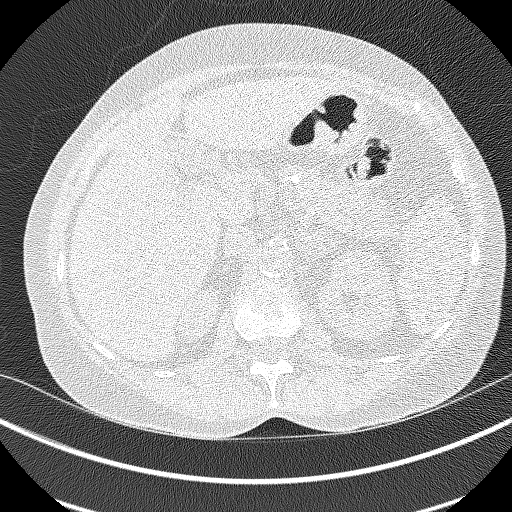
[im 45/310  lung]
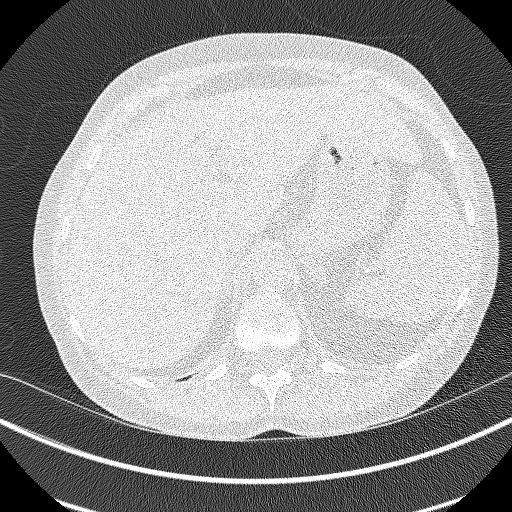
[im 74/310  lung]
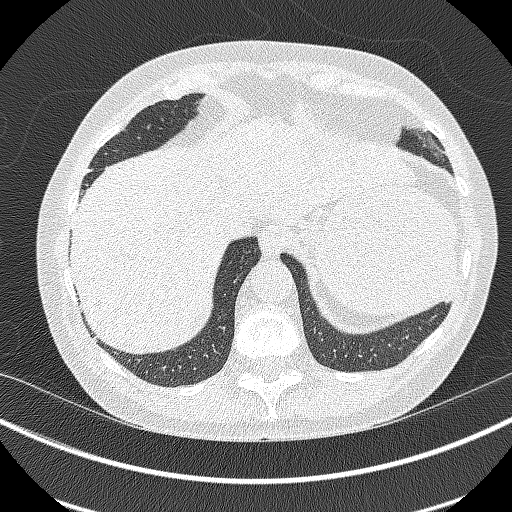
[im 89/310  lung]
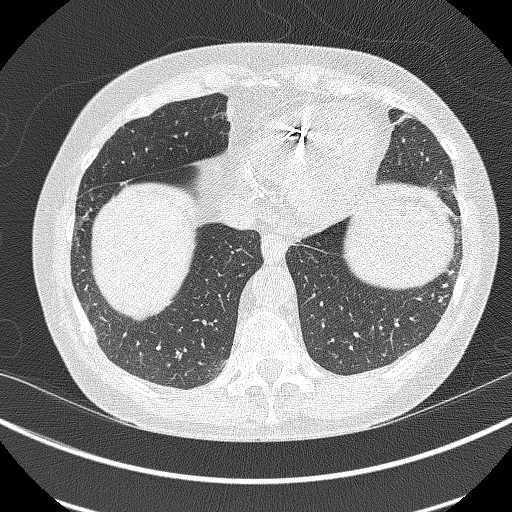
[im 118/310  mediastinal]
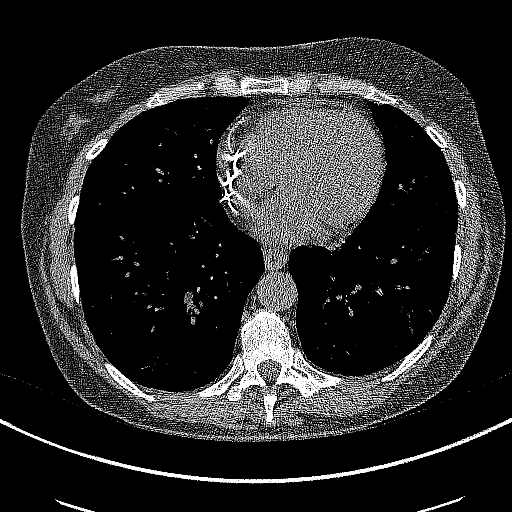
[im 118/310  lung]
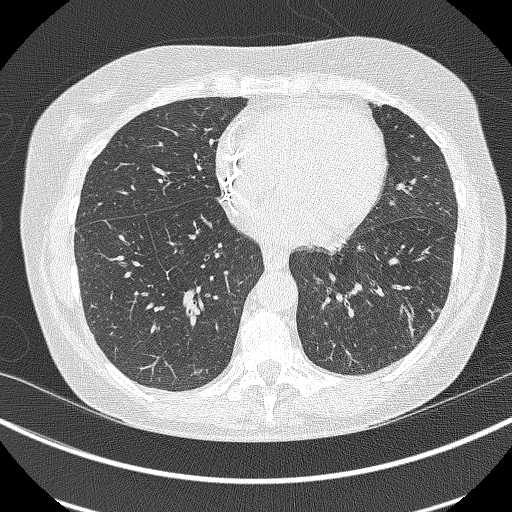
[im 148/310  lung]
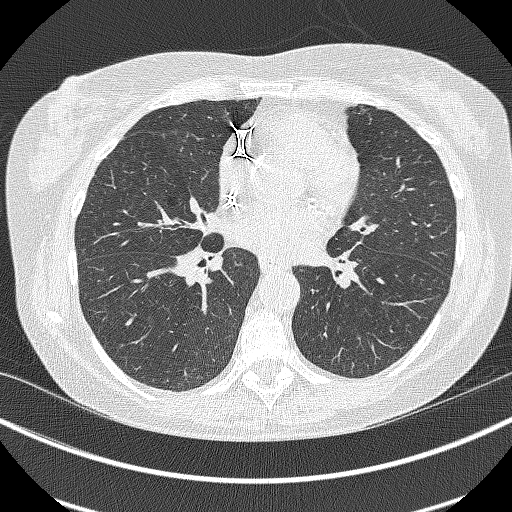
[im 162/310  lung]
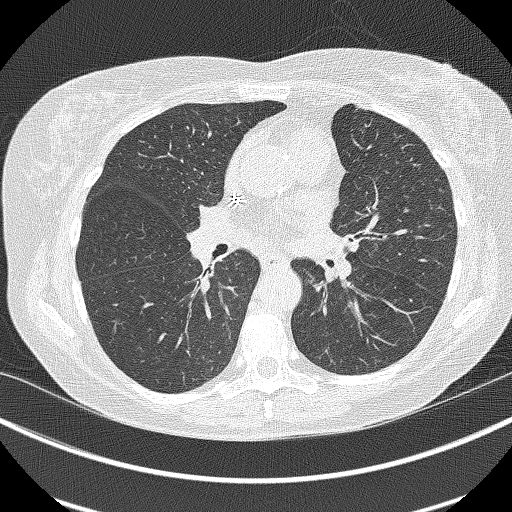
[im 192/310  lung]
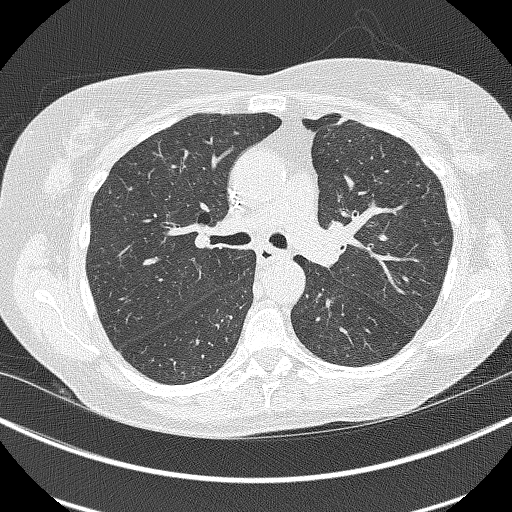
[im 221/310  mediastinal]
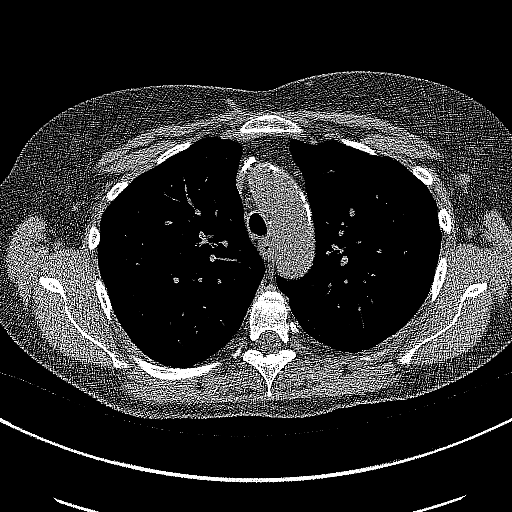
[im 221/310  lung]
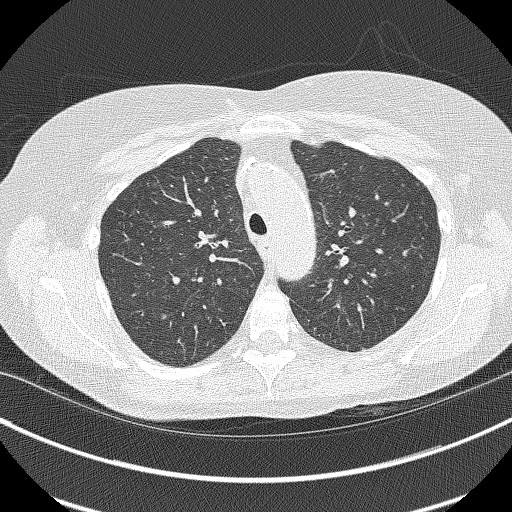
[im 236/310  lung]
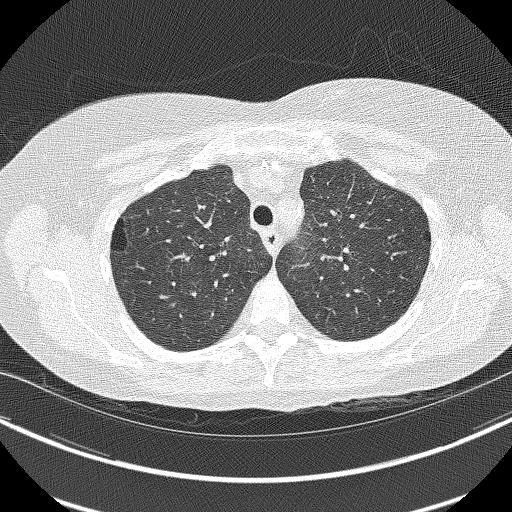
[im 265/310  lung]
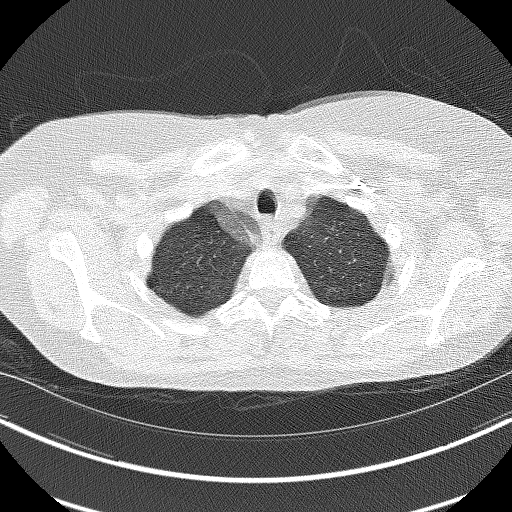
[im 295/310  lung]
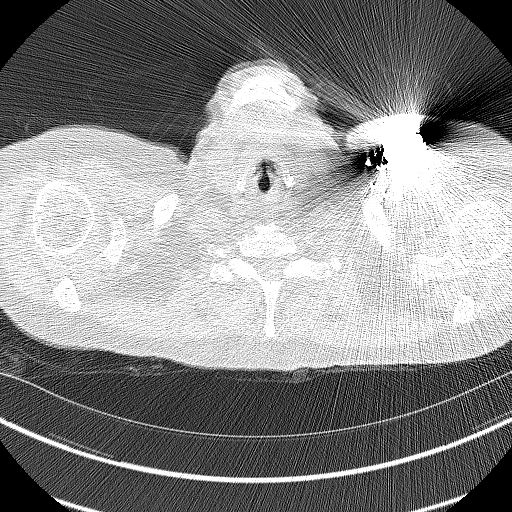

[Series 4: coronals lung 1.00 cor · coronal · 0.61mm/px · 3 of 308 slices shown]
[im 62/308  lung]
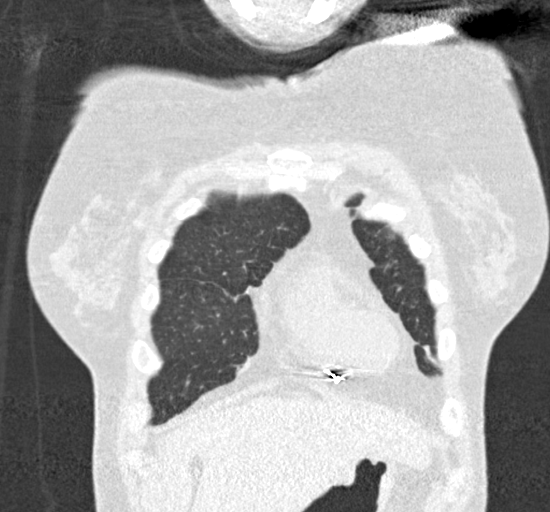
[im 123/308  lung]
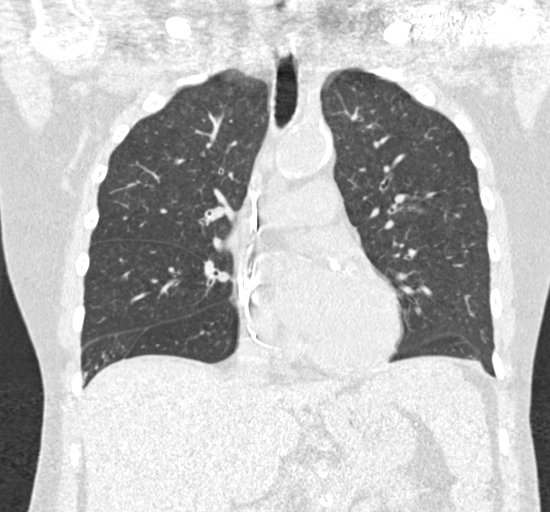
[im 185/308  lung]
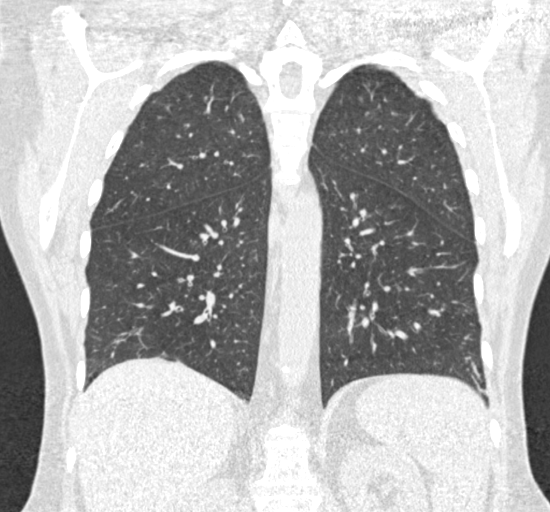

[15 of 40 positions shown; findings below may reference images not displayed]

FINDINGS: Cardiovascular: The heart is normal in size. No pericardial
effusion.

No evidence of thoracic aortic aneurysm. Atherosclerotic
calcifications of the aortic arch.

Left subclavian pacemaker.

Three vessel coronary atherosclerosis.

Mediastinum/Nodes: No suspicious mediastinal lymphadenopathy.

Visualized thyroid is unremarkable.

Lungs/Pleura: Evaluation of the lung parenchyma is constrained by
respiratory motion.

Mild paraseptal emphysematous changes, upper lung predominant.

No focal consolidation.

Scattered small bilateral pulmonary nodules, including a 6.2 mm
nodule in the central left lower lobe, mildly motion degraded but
grossly unchanged.

No pleural effusion or pneumothorax.

Upper Abdomen: Visualized upper abdomen is notable for stable
low-density thickening of the left adrenal gland, favoring benign
adrenal adenomas. Vascular calcifications.

Musculoskeletal: Visualized osseous structures are within normal
limits.
IMPRESSION: Lung-RADS 2, benign appearance or behavior. Continue annual
screening with low-dose chest CT without contrast in 12 months.

Aortic Atherosclerosis (7C9IW-GH2.2) and Emphysema (7C9IW-BOK.3).

## 2020-09-09 ENCOUNTER — Ambulatory Visit: Payer: Medicare Other | Attending: Family Medicine | Admitting: Physical Therapy

## 2020-09-14 ENCOUNTER — Other Ambulatory Visit: Payer: Self-pay | Admitting: Family Medicine

## 2020-09-14 ENCOUNTER — Telehealth: Payer: Self-pay

## 2020-09-14 DIAGNOSIS — F319 Bipolar disorder, unspecified: Secondary | ICD-10-CM

## 2020-09-14 NOTE — Telephone Encounter (Signed)
Medication Refill - Medication: Seroquel   Has the patient contacted their pharmacy? Yes.   (Agent: If no, request that the patient contact the pharmacy for the refill.) (Agent: If yes, when and what did the pharmacy advise?)  Preferred Pharmacy (with phone number or street name):  [facility] [place], [place] [address]  Phone: [phone] Fax: [phone]  Hours: Not open 24 hours     Agent: Please be advised that RX refills may take up to 3 business days. We ask that you follow-up with your pharmacy.

## 2020-09-14 NOTE — Telephone Encounter (Signed)
Patient husband stated she has enough medication to last until her appointment

## 2020-09-14 NOTE — Telephone Encounter (Signed)
New referral put in. Patient notified

## 2020-09-14 NOTE — Telephone Encounter (Signed)
Requested medication (s) are due for refill today:   Provider to determine  Requested medication (s) are on the active medication list:   Yes  Future visit scheduled:   Yes   Last ordered: 08/17/2020 #135, 0 refills  Non delegated refill   Requested Prescriptions  Pending Prescriptions Disp Refills   QUEtiapine (SEROQUEL) 100 MG tablet 135 tablet 0    Sig: TAKE 1/2 TABLET BY MOUTH ONCE EVERY MORNING AND 1 TAB AT BEDTIME      Not Delegated - Psychiatry:  Antipsychotics - Second Generation (Atypical) - quetiapine Failed - 09/14/2020 12:27 PM      Failed - This refill cannot be delegated      Passed - ALT in normal range and within 180 days    ALT  Date Value Ref Range Status  08/27/2020 11 6 - 29 U/L Final          Passed - AST in normal range and within 180 days    AST  Date Value Ref Range Status  08/27/2020 16 10 - 35 U/L Final          Passed - Last BP in normal range    BP Readings from Last 1 Encounters:  08/27/20 118/74          Passed - Valid encounter within last 6 months    Recent Outpatient Visits           2 weeks ago Paroxysmal A-fib [facility])   [facility] Delsa Grana, PA-C   1 month ago Chronic obstructive pulmonary disease, unspecified COPD type [facility])   [facility] Delsa Grana, PA-C   3 months ago Edema of right lower extremity   [facility] [place], Kristeen Miss, PA-C   5 months ago COPD with acute exacerbation [facility])   [facility] Delsa Grana, PA-C   10 months ago Decreased hearing, left   [facility] Delsa Grana, PA-C       Future Appointments             In 5 months Delsa Grana, PA-C [facility], [facility]

## 2020-09-14 NOTE — Telephone Encounter (Signed)
Copied from [place] [phone]. Topic: Referral - Request for Referral >> Sep 14, 2020 12:18 PM Celene Kras wrote: Has patient seen PCP for this complaint? Yes.   *If NO, is insurance requiring patient see PCP for this issue before PCP can refer them? Referral for which specialty: Psychiatry  Preferred provider/office: [facility]// [phone]  Reason for referral: Pt is needing to have a new psychiatrist and this one is covered by pts insurance. Please advise.

## 2020-09-15 ENCOUNTER — Ambulatory Visit: Payer: Medicare Other | Admitting: Physical Therapy

## 2020-09-17 ENCOUNTER — Ambulatory Visit: Payer: Medicare Other | Admitting: Physical Therapy

## 2020-09-22 ENCOUNTER — Encounter: Payer: Medicare Other | Admitting: Physical Therapy

## 2020-09-22 LAB — HEMOGLOBIN A1C: Hemoglobin A1C: 5.1

## 2020-09-24 ENCOUNTER — Encounter: Payer: Medicare Other | Admitting: Physical Therapy

## 2020-09-28 NOTE — Patient Instructions (Incomplete)
Hospital DC: 04/07/20  She presents with her husband asking for a physical therapy referral After ICU hospitalization, nursing home rehab she has completed her home health physical therapy and Occupational Therapy and they would like to see if she could do outpatient PT in order to maintain her progress.  She is now able to ambulate without the use of a rolling walker, though she does continue to have lower extremity weakness which is secondary to chronic low back pain and neurogenic claudication.   Pt also has hx of TBI, seizure disorder - Dr. Malvin Johns - reviewed chart/care everywhere - no visit for a few years - previously saw [facility] in [facility] prior to moving  She sees cardiology - Dr. Juliann Pares - for secondary heart block and has a pacemaker, recent ICU/hospital admission pt had Afib with RVR, and noted hx of paroxymal Afib - updated chart/problem list  PAD with multiple stents - on plavix, per [facility] vascular   Discharge summary: 65 year old female with a known history of COPD, seizure disorder, second-degree heart block status post permanent pacemaker placement, PVD status post multiple stent placement, history of CVA, emphysema, hypertension, hyperlipidemia, TBI with residual memory deficit,'s chronic hepatitis C, bipolar disorder, history of substance abuse, PTSD, tobacco abuse admitted for acute hypoxic respiratory failure secondary to pneumonia and COPD exacerbation.Initially admitted to [facility] on 03/20/2020.Due to worsening respiratory status and A. fib with RVR she was transferred to stepdown on 6/26. She failed BiPAP and high flow nasal cannula and transferred to ICU status on 6/28. Required intubation on 6/29, extubated on 7/7.She was transferred to medical floor on 7/10 andis weaned off to room air.  She completed the course of antibiotics and remained stable since then.  Patient has generalized weakness secondary to physical deconditioning with this acute illness.  Our PT are recommending rehab.  And patient is being discharged to rehab for further management and rehab. Due to her worsening weakness more dominant in upper body she underwent stroke work-up which was negative.  Patient has an history of paroxysmal A. fib, currently rate control and cardiology is not recommending anticoagulation due to risk of fall.  Patient also has moderate protein calorie malnutrition.  Patient has an history of bipolar disorder, her home dose of lithium and Seroquel was initially held due to acute illness and later restarted.  She will continue with rest of her home medications and follow-up with her primary care physician and psychiatrist for further management.

## 2020-09-29 ENCOUNTER — Ambulatory Visit: Payer: Medicare Other | Attending: Family Medicine

## 2020-09-29 ENCOUNTER — Encounter: Payer: Medicare Other | Admitting: Physical Therapy

## 2020-09-29 ENCOUNTER — Other Ambulatory Visit: Payer: Self-pay

## 2020-09-29 DIAGNOSIS — M6281 Muscle weakness (generalized): Secondary | ICD-10-CM | POA: Diagnosis present

## 2020-09-29 DIAGNOSIS — R2681 Unsteadiness on feet: Secondary | ICD-10-CM
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
## 2020-10-01 ENCOUNTER — Encounter: Payer: Medicare Other | Admitting: Physical Therapy

## 2020-10-06 ENCOUNTER — Ambulatory Visit: Payer: Medicare Other

## 2020-10-06 NOTE — Patient Instructions (Incomplete)
Consider 2 or 6MWT to assess endurance, 13m gait speed, DGI, initiate HEP, consider adding goals if appropriate   Pt is a pleasant 29 year-oldfemale referred for gait abnormality and increased risk for falls. She presents with some coordination deficits more notably on the L side. Mild decrease in gait speed with occasional lateral staggerring but able to self-correct without external assistance. Balance deficits as identified by BERG score of 48/56. Mild deficits in BLE strength noted functionally with 5TSTS and MMT. Pt presents with deficits in strength, gait and balance. She will benefit from skilled PT services to address these deficits in order to improve function at home and decrease risk for future falls.

## 2020-10-08 ENCOUNTER — Encounter: Payer: Self-pay | Admitting: Pulmonary Disease

## 2020-10-08 ENCOUNTER — Ambulatory Visit (INDEPENDENT_AMBULATORY_CARE_PROVIDER_SITE_OTHER): Payer: Medicare Other | Admitting: Pulmonary Disease

## 2020-10-08 ENCOUNTER — Other Ambulatory Visit: Payer: Self-pay

## 2020-10-08 ENCOUNTER — Ambulatory Visit: Payer: Medicare Other

## 2020-10-08 VITALS — BP 120/70 | HR 92 | Temp 97.0°F | Ht 65.0 in | Wt 147.2 lb

## 2020-10-08 DIAGNOSIS — R2681 Unsteadiness on feet: Secondary | ICD-10-CM | POA: Diagnosis not present

## 2020-10-08 DIAGNOSIS — M6281 Muscle weakness (generalized): Secondary | ICD-10-CM

## 2020-10-08 DIAGNOSIS — J449 Chronic obstructive pulmonary disease, unspecified: Secondary | ICD-10-CM

## 2020-10-08 NOTE — Patient Instructions (Signed)
Follow up in 6 months 
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
## 2020-10-08 NOTE — Progress Notes (Signed)
[name]  Chief Complaint  Patient presents with  . New Patient (Initial Visit)    Here for a check up.    Constitutional:  BP 120/70 (BP Location: Left Arm, Patient Position: Sitting, Cuff Size: Normal)   Pulse 92   Temp (!) 97 F (36.1 C) (Temporal)   Ht '5\' 5"'  (1.651 m)   Wt 147 lb 3.2 oz (66.8 kg)   SpO2 97%   BMI 24.50 kg/m   Past Medical History:  Bipolar, Pneumonia, Hep C, HTN, 2nd degree heart block s/p PM, Seizure, TBI, PAD, CVA, Polysubstance abuse  Past Surgical History:  She  has a past surgical history that includes Tubal ligation; pacemaker placement (07/2009); Colonoscopy (07/31/13); and Lower Extremity Angiography (Left, 10/29/2019).  Brief Summary:  Cynthia [name] is a 65 y.o. female former smoker with COPD.       Subjective:   She is here with her husband.  She was previously seen by Dr. Alva Garnet and Dr. Ashby Dawes.    She was in hospital with respiratory failure from pneumonia in June of 2021.  She stopped smoking cigarettes after this.  She used to smoke 2 to 3 packs per day.  She has cough with clear to green sputum.  Not having wheeze, fever, hemoptysis, or chest pain.  Uses trelegy daily.  Uses albuterol nebulizer intermittently.  She has been enrolled with physical therapy.  She is gradually increasing her stamina.  She is not having any issues with her sleep.  CBC with differential from 08/27/20 showed absolute eosinophil count of 240 cells/ul  Physical Exam:   Appearance - well kempt   ENMT - no sinus tenderness, no oral exudate, no LAN, Mallampati 3 airway, no stridor  Respiratory - equal breath sounds bilaterally, scattered rhonchi that clears with coughing no wheezing or rales  CV - s1s2 regular rate and rhythm, no murmurs  Ext - no clubbing, no edema  Skin - no rashes  Psych - normal mood and affect   Pulmonary testing:   PFT 11/19/15 >> FEV1 1.66 (76%), FEV1% 71, TLC 4.71 (90%),  DLCO 60%  Chest Imaging:   LDCT chest 11/25/19 >> atherosclerosis, mild paraseptal emphysema, scattered nodules up to 6.2 mm in LLL  Sleep Tests:   PSG 08/31/17 >> AHI 0, SpO2 low 92.3%  Cardiac Tests:   Echo 03/23/20 >> EF 50 to 55%, mild MR, mild/mod AR  Social History:  She  reports that she quit smoking about 6 months ago. Her smoking use included cigarettes. She has a 20.00 pack-year smoking history. She has never used smokeless tobacco. She reports previous alcohol use. She reports current drug use. Drug: Marijuana.  Family History:  Her family history includes Alcohol abuse in her brother, father, and sister; Anxiety disorder in her mother; Bipolar disorder in her brother and sister; Cancer in her father, maternal aunt, mother, paternal grandfather, and sister; Drug abuse in her brother and sister; Heart disease in her maternal aunt, maternal grandmother, and mother; Hypertension in her father, maternal grandfather, maternal grandmother, mother, paternal grandfather, and paternal grandmother; Mental illness in her sister; Schizophrenia in her sister; Stroke in her maternal aunt and paternal grandfather.     Assessment/Plan:   COPD with emphysema and chronic bronchitis. - continue trelegy; advised her to rinse her mouth after using trelegy to reduce risk of developing thrush  History of tobacco abuse. - encouraged her to not pick up cigarette smoking again - she will get low dose  CT chest in March 2022 as part of lung cancer screening program  CAD, Sick sinus syndrome and 2nd degree heart block s/p pacemaker. - followed by Dr. Lujean Amel with Cardiology in [facility]  Time Spent Involved in Patient Care on Day of Examination:  34 minutes  Follow up:  Patient Instructions  Follow up in 6 months   Medication List:   Allergies as of 10/08/2020      Reactions   Morphine Anaphylaxis   Cardiac arrest      Medication List       Accurate as of October 08, 2020  10:46 AM. If you have any questions, ask your nurse or doctor.        albuterol 108 (90 Base) MCG/ACT inhaler Commonly known as: ProAir HFA INHALE 2 PUFFS BY MOUTH EVERY 6 HOURS IFNEEDED FOR WHEEZING OR SHORTNESS OF BREATH.   amantadine 100 MG capsule Commonly known as: SYMMETREL Take 1 capsule (100 mg total) by mouth daily.   amLODipine 10 MG tablet Commonly known as: NORVASC Take 10 mg by mouth daily.   aspirin EC 81 MG tablet Take 1 tablet (81 mg total) by mouth daily.   atorvastatin 20 MG tablet Commonly known as: LIPITOR Take 1 tablet (20 mg total) by mouth at bedtime.   clopidogrel 75 MG tablet Commonly known as: PLAVIX TAKE 1 TABLET BY MOUTH ONCE DAILY   folic acid 1 MG tablet Commonly known as: FOLVITE Take 1 tablet (1 mg total) by mouth daily.   hydrOXYzine 25 MG tablet Commonly known as: ATARAX/VISTARIL Take 1-2 tablets (25-50 mg total) by mouth 3 (three) times daily as needed for itching.   ipratropium-albuterol 0.5-2.5 (3) MG/3ML Soln Commonly known as: DUONEB Take 3 mLs by nebulization every 6 (six) hours as needed (for SOB, cough, chest tightness).   lithium carbonate 300 MG capsule Take 1 capsule (300 mg total) by mouth 2 (two) times daily with a meal.   losartan 50 MG tablet Commonly known as: COZAAR Take 1 tablet (50 mg total) by mouth daily.   Misc. Devices Kit Manual wheelchair   Bilateral leg weakness Codes: R29.898  Impaired mobility and ADLs Codes: Z74.09, Z78.9 Generalized weakness Codes: R53.1 Abnormality of gait Codes: R26.9   multivitamin with minerals Tabs tablet Take 1 tablet by mouth daily.   nitroGLYCERIN 0.4 MG SL tablet Commonly known as: NITROSTAT Place 1 tablet (0.4 mg total) under the tongue every 5 (five) minutes as needed for chest pain. Maximum of 3 pills; call 911 at first sign of chest pain   Oxcarbazepine 300 MG tablet Commonly known as: TRILEPTAL TAKE 1 TABLET BY MOUTH AT BEDTIME   QUEtiapine 100 MG  tablet Commonly known as: SEROQUEL TAKE 1/2 TABLET BY MOUTH ONCE EVERY MORNING AND 1 TAB AT BEDTIME   Trelegy Ellipta 100-62.5-25 MCG/INH Aepb Generic drug: Fluticasone-Umeclidin-Vilant Inhale 1 puff into the lungs daily.       Signature:  Chesley Mires, MD [place] Pager - [phone] 10/08/2020, 10:46 AM

## 2020-10-13 ENCOUNTER — Ambulatory Visit: Payer: Medicare Other | Admitting: Physical Therapy

## 2020-10-19 ENCOUNTER — Other Ambulatory Visit: Payer: Self-pay

## 2020-10-19 ENCOUNTER — Ambulatory Visit: Payer: Medicare Other

## 2020-10-19 DIAGNOSIS — R2681 Unsteadiness on feet: Secondary | ICD-10-CM | POA: Diagnosis not present

## 2020-10-19 DIAGNOSIS — M6281 Muscle weakness (generalized): Secondary | ICD-10-CM
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
## 2020-10-21 ENCOUNTER — Encounter: Payer: Self-pay | Admitting: Physical Therapy

## 2020-10-21 ENCOUNTER — Other Ambulatory Visit: Payer: Self-pay | Admitting: Psychiatry

## 2020-10-21 ENCOUNTER — Ambulatory Visit: Payer: Medicare Other | Admitting: Physical Therapy

## 2020-10-21 ENCOUNTER — Other Ambulatory Visit: Payer: Self-pay

## 2020-10-21 DIAGNOSIS — R2681 Unsteadiness on feet: Secondary | ICD-10-CM | POA: Diagnosis not present

## 2020-10-21 DIAGNOSIS — F3178 Bipolar disorder, in full remission, most recent episode mixed: Secondary | ICD-10-CM

## 2020-10-21 DIAGNOSIS — M6281 Muscle weakness (generalized): Secondary | ICD-10-CM
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
## 2020-10-26 ENCOUNTER — Ambulatory Visit: Payer: Medicare Other

## 2020-10-28 ENCOUNTER — Other Ambulatory Visit: Payer: Self-pay

## 2020-10-28 ENCOUNTER — Ambulatory Visit: Payer: Medicare Other | Attending: Family Medicine

## 2020-10-28 DIAGNOSIS — M6281 Muscle weakness (generalized): Secondary | ICD-10-CM | POA: Diagnosis present

## 2020-10-28 DIAGNOSIS — R2681 Unsteadiness on feet: Secondary | ICD-10-CM | POA: Diagnosis present
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
## 2020-11-02 ENCOUNTER — Ambulatory Visit: Payer: Medicare Other

## 2020-11-04 ENCOUNTER — Ambulatory Visit: Payer: Medicare Other | Admitting: Physical Therapy

## 2020-11-04 ENCOUNTER — Other Ambulatory Visit: Payer: Self-pay

## 2020-11-04 ENCOUNTER — Encounter: Payer: Self-pay | Admitting: Physical Therapy

## 2020-11-04 DIAGNOSIS — M6281 Muscle weakness (generalized): Secondary | ICD-10-CM

## 2020-11-04 DIAGNOSIS — R2681 Unsteadiness on feet: Secondary | ICD-10-CM
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
## 2020-11-09 ENCOUNTER — Ambulatory Visit: Payer: Medicare Other

## 2020-11-09 ENCOUNTER — Other Ambulatory Visit: Payer: Self-pay

## 2020-11-09 DIAGNOSIS — M6281 Muscle weakness (generalized): Secondary | ICD-10-CM

## 2020-11-09 DIAGNOSIS — R2681 Unsteadiness on feet: Secondary | ICD-10-CM
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
## 2020-11-11 ENCOUNTER — Encounter: Payer: Self-pay | Admitting: Physical Therapy

## 2020-11-11 ENCOUNTER — Ambulatory Visit: Payer: Medicare Other | Admitting: Physical Therapy

## 2020-11-11 ENCOUNTER — Other Ambulatory Visit: Payer: Self-pay

## 2020-11-11 DIAGNOSIS — R2681 Unsteadiness on feet: Secondary | ICD-10-CM | POA: Diagnosis not present

## 2020-11-11 DIAGNOSIS — M6281 Muscle weakness (generalized): Secondary | ICD-10-CM
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
## 2020-11-16 ENCOUNTER — Other Ambulatory Visit: Payer: Self-pay

## 2020-11-16 ENCOUNTER — Ambulatory Visit: Payer: Medicare Other

## 2020-11-16 DIAGNOSIS — R2681 Unsteadiness on feet: Secondary | ICD-10-CM | POA: Diagnosis not present

## 2020-11-16 DIAGNOSIS — M6281 Muscle weakness (generalized): Secondary | ICD-10-CM
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
## 2020-11-18 ENCOUNTER — Ambulatory Visit: Payer: Medicare Other | Admitting: Physical Therapy

## 2020-11-18 ENCOUNTER — Encounter: Payer: Self-pay | Admitting: Physical Therapy

## 2020-11-18 ENCOUNTER — Other Ambulatory Visit: Payer: Self-pay

## 2020-11-18 DIAGNOSIS — R2681 Unsteadiness on feet: Secondary | ICD-10-CM

## 2020-11-18 DIAGNOSIS — M6281 Muscle weakness (generalized): Secondary | ICD-10-CM
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
## 2020-11-19 ENCOUNTER — Telehealth: Payer: Self-pay

## 2020-11-19 DIAGNOSIS — Z122 Encounter for screening for malignant neoplasm of respiratory organs: Secondary | ICD-10-CM

## 2020-11-19 DIAGNOSIS — Z87891 Personal history of nicotine dependence: Secondary | ICD-10-CM

## 2020-11-20 NOTE — Telephone Encounter (Signed)
Contacted and scheduled for annual lung screening scan. Patient is a former smoker, quit 02/2020. 87 pack year history.

## 2020-11-23 ENCOUNTER — Other Ambulatory Visit: Payer: Self-pay

## 2020-11-23 ENCOUNTER — Ambulatory Visit: Payer: Medicare Other

## 2020-11-23 DIAGNOSIS — R2681 Unsteadiness on feet: Secondary | ICD-10-CM

## 2020-11-23 DIAGNOSIS — M6281 Muscle weakness (generalized): Secondary | ICD-10-CM
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
## 2020-11-26 ENCOUNTER — Ambulatory Visit: Payer: Medicare Other | Attending: Family Medicine | Admitting: Physical Therapy

## 2020-11-26 DIAGNOSIS — R2681 Unsteadiness on feet: Secondary | ICD-10-CM | POA: Insufficient documentation

## 2020-11-26 DIAGNOSIS — M6281 Muscle weakness (generalized): Secondary | ICD-10-CM | POA: Insufficient documentation

## 2020-11-30 ENCOUNTER — Ambulatory Visit: Payer: Medicare Other | Admitting: Physical Therapy

## 2020-11-30 ENCOUNTER — Encounter: Payer: Self-pay | Admitting: Physical Therapy

## 2020-11-30 ENCOUNTER — Other Ambulatory Visit: Payer: Self-pay

## 2020-11-30 DIAGNOSIS — R2681 Unsteadiness on feet: Secondary | ICD-10-CM

## 2020-11-30 DIAGNOSIS — M6281 Muscle weakness (generalized): Secondary | ICD-10-CM | POA: Diagnosis present
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
## 2020-12-03 ENCOUNTER — Other Ambulatory Visit: Payer: Self-pay

## 2020-12-03 ENCOUNTER — Ambulatory Visit
Admission: RE | Admit: 2020-12-03 | Discharge: 2020-12-03 | Disposition: A | Discharge status: Home / Self Care | Payer: Medicare Other | Source: Ambulatory Visit | Attending: Nurse Practitioner | Admitting: Nurse Practitioner

## 2020-12-03 DIAGNOSIS — Z122 Encounter for screening for malignant neoplasm of respiratory organs: Secondary | ICD-10-CM | POA: Diagnosis present

## 2020-12-03 DIAGNOSIS — Z87891 Personal history of nicotine dependence: Secondary | ICD-10-CM | POA: Insufficient documentation

## 2020-12-07 ENCOUNTER — Encounter: Payer: Self-pay | Admitting: Physical Therapy

## 2020-12-07 ENCOUNTER — Other Ambulatory Visit: Payer: Self-pay

## 2020-12-07 ENCOUNTER — Ambulatory Visit: Payer: Medicare Other | Admitting: Physical Therapy

## 2020-12-07 DIAGNOSIS — M6281 Muscle weakness (generalized): Secondary | ICD-10-CM

## 2020-12-07 DIAGNOSIS — R2681 Unsteadiness on feet: Secondary | ICD-10-CM | POA: Diagnosis not present
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
## 2020-12-10 ENCOUNTER — Telehealth: Payer: Self-pay | Admitting: *Deleted

## 2020-12-10 NOTE — Telephone Encounter (Signed)
appt scheduled with Coralyn Pear for 4.5.2022

## 2020-12-10 NOTE — Telephone Encounter (Signed)
Notified patient of LDCT lung cancer screening program results with recommendation for 6 month follow up imaging. Also notified of incidental findings noted below and is encouraged to discuss further with PCP who will receive a copy of this note and/or the CT report. Patient verbalizes understanding.   IMPRESSION: 1. Lung-RADS 3, probably benign findings. Short-term follow-up in 6 months is recommended with repeat low-dose chest CT without contrast (please use the following order, "CT CHEST LCS NODULE FOLLOW-UP W/O CM"). New 5.5 mm subpleural nodular focus of consolidation in dependent basilar left lower lobe. 2. Three-vessel coronary atherosclerosis. 3. Stable left adrenal adenoma. 4. Aortic Atherosclerosis (ICD10-I70.0) and Emphysema (ICD10-J43.9).

## 2020-12-14 ENCOUNTER — Ambulatory Visit: Payer: Medicare Other

## 2020-12-14 NOTE — Patient Instructions (Incomplete)
Update goals with pt in anticipation of discharge next visit   TREATMENT    Therapeutic Exercise NuStep L1-3  x25minutes for warm-up with resistance adjustments and VCs for pacing Superset 1 (1 min rest between):             Seated LAQ with 5# ankle weights3x10 BLE;             Seated Hip flexion marcheswith 5# ankle weights 3x10 BLE; Superset 2 (1 min rest between):             Seated clams withgreen tband resistance 3x 20 BLE;             Seated adduction ball squeeze 3 x20 BLE   Sit to stand from regular height chair without UE support 3x 12, intermittent TCs/spotting for repcompletion in last set  Patient educated throughout session on appropriate technique and form using multi-modal cueing, HEP, and activity modification. Patient articulated understanding and returned demonstration.   ASSESSMENT Patient presents to clinic with excellent motivation to participate in therapy. Patient demonstrates deficits in BLE strength and balance. Patientappreciated superset structured therapeutic exercise with increased repetitions during today's session and responded positively to active interventions. Patient interested in working with more equipment at next session. Patient will benefit from continued skilled therapeutic intervention to address remaining deficits in BLE strength and balance in order to decrease risk of falls, increase function, and improve overall QOL.

## 2020-12-18 NOTE — Patient Instructions (Signed)
Update outcome measures and goals, Discharge?  TREATMENT  Therapeutic Exercise NuStep L1-3  x26minutes for warm-up with resistance adjustments and VCs for pacing Superset 1 (1 min rest between):             Seated LAQ with 5# ankle weights3x10 BLE;             Seated Hip flexion marcheswith 5# ankle weights 3x10 BLE; Superset 2 (1 min rest between):             Seated clams withgreen tband resistance 3x 20 BLE;             Seated adduction ball squeeze 3 x20 BLE   Sit to stand from regular height chair without UE support 3x 12, intermittent TCs/spotting for repcompletion in last set  Patient educated throughout session on appropriate technique and form using multi-modal cueing, HEP, and activity modification. Patient articulated understanding and returned demonstration.  ASSESSMENT Patient presents to clinic with excellent motivation to participate in therapy. Patient demonstrates deficits in BLE strength and balance. Patientappreciated superset structured therapeutic exercise with increased repetitions during today's session and responded positively to active interventions. Patient interested in working with more equipment at next session. Patient will benefit from continued skilled therapeutic intervention to address remaining deficits in BLE strength and balance in order to decrease risk of falls, increase function, and improve overall QOL.

## 2020-12-21 ENCOUNTER — Encounter: Payer: Self-pay | Admitting: Physical Therapy

## 2020-12-21 ENCOUNTER — Other Ambulatory Visit: Payer: Self-pay

## 2020-12-21 ENCOUNTER — Ambulatory Visit: Payer: Medicare Other | Admitting: Physical Therapy

## 2020-12-21 DIAGNOSIS — M6281 Muscle weakness (generalized): Secondary | ICD-10-CM

## 2020-12-21 DIAGNOSIS — R2681 Unsteadiness on feet: Secondary | ICD-10-CM | POA: Diagnosis not present
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
## 2020-12-22 ENCOUNTER — Other Ambulatory Visit: Payer: Self-pay | Admitting: Psychiatry

## 2020-12-22 DIAGNOSIS — F319 Bipolar disorder, unspecified: Secondary | ICD-10-CM

## 2020-12-29 ENCOUNTER — Encounter: Payer: Self-pay | Admitting: Physician Assistant

## 2020-12-29 ENCOUNTER — Ambulatory Visit (INDEPENDENT_AMBULATORY_CARE_PROVIDER_SITE_OTHER): Payer: Medicare Other | Admitting: Physician Assistant

## 2020-12-29 ENCOUNTER — Other Ambulatory Visit: Payer: Self-pay

## 2020-12-29 VITALS — BP 122/68 | HR 88 | Temp 98.0°F | Resp 16 | Ht 65.0 in | Wt 140.7 lb

## 2020-12-29 DIAGNOSIS — R29898 Other symptoms and signs involving the musculoskeletal system: Secondary | ICD-10-CM

## 2020-12-29 DIAGNOSIS — R2681 Unsteadiness on feet: Secondary | ICD-10-CM

## 2020-12-29 DIAGNOSIS — R911 Solitary pulmonary nodule: Secondary | ICD-10-CM | POA: Diagnosis not present

## 2020-12-29 NOTE — Patient Instructions (Signed)
Pulmonary Nodule  A pulmonary nodule is a small, round growth of tissue in the lung. A nodule may be cancer, but most nodules are not cancer. What are the causes?  Infection from a germ (bacteria, fungus, or virus), such as tuberculosis.  Tissue that is cancer, such as: ? Cancer in the lung. ? Cancer that has spread to the lung from another part of the body.  A growth of tissue (mass) that is not cancer.  Swelling and irritation from conditions such as rheumatoid arthritis.  Having blood vessels that are not normal in the lungs. What are the signs or symptoms? Many times, there are no symptoms. If you get symptoms, they normally have another cause, such as infection. How is this treated? Treatment depends on:  If your nodule is cancer or if it is not cancer.  What your risk of getting cancer is. Some nodules are not cancer. If this is the case for you, you may not need treatment. Your doctor may do tests to watch the nodule for changes. If the nodule is cancer:  You will need tests, such as CT and PET scans.  You may need treatment. This may include: ? Surgery. ? Treatment with high-energy X-rays (radiation therapy). ? Medicines. Some nodules need to be taken out. You may have a procedure to have the nodule taken out. During the procedure, your doctor will make a cut (incision) into your chest and take out the part of your lung that has the nodule. Follow these instructions at home:  Take over-the-counter and prescription medicines only as told by your doctor.  Do not smoke or use any products that contain nicotine or tobacco. If you need help quitting, ask your doctor.  Keep all follow-up visits. Contact a doctor if:  You have pain in your chest, back, or shoulder.  You are short of breath or have trouble breathing when you are active.  You get a cough.  Your voice starts to sound raspy, breathy, or strained (hoarse), and you do not know why.  You feel sick or more  tired than normal.  You do not feel like eating.  You lose weight without trying.  You get chills, or you start to sweat a lot during sleep.  You need two or more pillows to sleep on at night.  You have: ? A fever and your symptoms get worse all of a sudden. ? A fever or symptoms for more than 2-3 days. Get help right away if:  You cannot catch your breath.  You have sudden chest pain.  You start making high-pitched whistling sounds when you breathe, most often when you breathe out (you wheeze).  You cannot stop coughing.  You cough up blood or bloody mucus from your lungs (sputum).  You get dizzy or feel like you may faint. These symptoms may represent a serious problem that is an emergency. Do not wait to see if the symptoms will go away. Get medical help right away. Call your local emergency services (911 in the [place] Do not drive yourself to the hospital. Summary  A pulmonary nodule is a small, round growth of tissue in the lung. Most of these nodules are not cancer.  Common causes of nodules in the lung include infection, swelling and irritation, and growths that are not cancer.  Treatment depends on whether the nodule is cancer or is not cancer. Treatment also depends on your risk of getting cancer.  If the nodule is cancer, you will need   certain tests and treatments as told by your doctor. This information is not intended to replace advice given to you by your health care provider. Make sure you discuss any questions you have with your health care provider. Document Revised: 04/01/2020 Document Reviewed: 04/01/2020 Elsevier Patient Education  2021 Elsevier Inc.  

## 2020-12-30 NOTE — Progress Notes (Signed)
Established patient visit   Patient: Cynthia [name]   DOB: 1956-04-22   65 y.o. Female  MRN: [id number] Visit Date: 12/29/2020  Today's healthcare provider: Trinna Post, PA-C   Chief Complaint  Patient presents with  . CT scan review   Subjective    HPI   Patient presents today to discuss low dose CT lung cancer screening. She has a history of tobacco abuse and also emphysema. The findings are below:   FINDINGS: Cardiovascular: Normal heart size. No significant pericardial effusion/thickening. Three-vessel coronary atherosclerosis. Two lead left subclavian pacemaker lead tips in the right atrium and right ventricular apex. Atherosclerotic nonaneurysmal thoracic aorta. Normal caliber pulmonary arteries.  Mediastinum/Nodes: No discrete thyroid nodules. Unremarkable esophagus. No pathologically enlarged axillary, mediastinal or hilar lymph nodes, noting limited sensitivity for the detection of hilar adenopathy on this noncontrast study.  Lungs/Pleura: No pneumothorax. No pleural effusion. Mild centrilobular and paraseptal emphysema. No acute consolidative airspace disease or lung masses. No significant growth of previously visualized scattered small pulmonary nodules. New subpleural nodular focus of consolidation in posterior basilar left lower lobe measuring 5.5 mm in volume derived mean diameter (series 3/image 271), somewhat curvilinear appearing on coronal reformats, favoring benign etiology. New right upper lobe ground-glass pulmonary nodule measuring 10.9 mm in volume derived mean diameter (series 3/image 129).  Upper abdomen: Stable 2.5 cm left adrenal nodule with density 16 HU, compatible with adenoma.  Musculoskeletal: No aggressive appearing focal osseous lesions. Mild thoracic spondylosis.  IMPRESSION: 1. Lung-RADS 3, probably benign findings. Short-term follow-up in 6 months is recommended with repeat low-dose chest CT without  contrast (please use the following order, "CT CHEST LCS NODULE FOLLOW-UP W/O CM"). New 5.5 mm subpleural nodular focus of consolidation in dependent basilar left lower lobe. 2. Three-vessel coronary atherosclerosis. 3. Stable left adrenal adenoma. 4. Aortic Atherosclerosis (ICD10-I70.0) and Emphysema (ICD10-J43.9).      Medications: Outpatient Medications Prior to Visit  Medication Sig  . albuterol (PROAIR HFA) 108 (90 Base) MCG/ACT inhaler INHALE 2 PUFFS BY MOUTH EVERY 6 HOURS IFNEEDED FOR WHEEZING OR SHORTNESS OF BREATH.  Marland Kitchen amantadine (SYMMETREL) 100 MG capsule Take 1 capsule (100 mg total) by mouth daily.  Marland Kitchen amLODipine (NORVASC) 10 MG tablet Take 10 mg by mouth daily.  Marland Kitchen aspirin EC 81 MG tablet Take 1 tablet (81 mg total) by mouth daily.  Marland Kitchen atorvastatin (LIPITOR) 20 MG tablet Take 1 tablet (20 mg total) by mouth at bedtime.  . clopidogrel (PLAVIX) 75 MG tablet TAKE 1 TABLET BY MOUTH ONCE DAILY  . Fluticasone-Umeclidin-Vilant (TRELEGY ELLIPTA) 100-62.5-25 MCG/INH AEPB Inhale 1 puff into the lungs daily.  . folic acid (FOLVITE) 1 MG tablet Take 1 tablet (1 mg total) by mouth daily.  . hydrOXYzine (ATARAX/VISTARIL) 25 MG tablet Take 1-2 tablets (25-50 mg total) by mouth 3 (three) times daily as needed for itching.  Marland Kitchen ipratropium-albuterol (DUONEB) 0.5-2.5 (3) MG/3ML SOLN Take 3 mLs by nebulization every 6 (six) hours as needed (for SOB, cough, chest tightness).  Marland Kitchen lithium carbonate 300 MG capsule Take 1 capsule (300 mg total) by mouth 2 (two) times daily with a meal.  . losartan (COZAAR) 50 MG tablet Take 1 tablet (50 mg total) by mouth daily.  . Misc. Devices KIT Manual wheelchair   Bilateral leg weakness Codes: R29.898  Impaired mobility and ADLs Codes: Z74.09, Z78.9 Generalized weakness Codes: R53.1 Abnormality of gait Codes: R26.9  . Multiple Vitamin (MULTIVITAMIN WITH MINERALS) TABS tablet Take 1 tablet by mouth daily.  Marland Kitchen  nitroGLYCERIN (NITROSTAT) 0.4 MG SL tablet Place 1 tablet  (0.4 mg total) under the tongue every 5 (five) minutes as needed for chest pain. Maximum of 3 pills; call 911 at first sign of chest pain  . Oxcarbazepine (TRILEPTAL) 300 MG tablet TAKE 1 TABLET BY MOUTH AT BEDTIME  . QUEtiapine (SEROQUEL) 100 MG tablet TAKE 1/2 TABLET BY MOUTH ONCE EVERY MORNING AND 1 TAB AT BEDTIME   No facility-administered medications prior to visit.    Review of Systems     Objective    BP 122/68   Pulse 88   Temp 98 F (36.7 C)   Resp 16   Ht '5\' 5"'  (1.651 m)   Wt 140 lb 11.2 oz (63.8 kg)   SpO2 95%   BMI 23.41 kg/m     Physical Exam Constitutional:      Appearance: Normal appearance.  Cardiovascular:     Rate and Rhythm: Normal rate and regular rhythm.     Heart sounds: Normal heart sounds.  Pulmonary:     Effort: Pulmonary effort is normal.     Breath sounds: Normal breath sounds.  Skin:    General: Skin is warm and dry.  Neurological:     Mental Status: She is alert and oriented to person, place, and time. Mental status is at baseline.  Psychiatric:        Mood and Affect: Mood normal.        Behavior: Behavior normal.       No results found for any visits on 12/29/20.  Assessment & Plan    1. Pulmonary nodule  New 5.75m nodule and new 10.6 mm ground glass nodule in right upper lobe. Impression refers only to 5.5 mm nodule and recommend follow up in 6 months however I think her new 10.6 mm ground glass nodule necessitates more in depth follow up. Recommend referral to oncology for further evaluation.   - Ambulatory referral to Oncology  2. Unsteadiness on feet  - Ambulatory referral to Physical Therapy  3. Weakness of hand  - Ambulatory referral to Physical Therapy   Return if symptoms worsen or fail to improve.      I spent 30  minutes dedicated to the care of this patient on the date of this encounter to include pre-visit review of records, face-to-face time with the patient discussing pulmonary nodule, and post visit  ordering of testing.    ATrinna Post PA-C  [phone](phone) [phone](fax)  [place]

## 2021-01-13 ENCOUNTER — Ambulatory Visit: Payer: Medicare Other | Attending: Family Medicine | Admitting: Occupational Therapy

## 2021-01-13 ENCOUNTER — Other Ambulatory Visit: Payer: Self-pay

## 2021-01-13 ENCOUNTER — Encounter: Payer: Self-pay | Admitting: Occupational Therapy

## 2021-01-13 DIAGNOSIS — M25532 Pain in left wrist: Secondary | ICD-10-CM | POA: Diagnosis present

## 2021-01-13 DIAGNOSIS — M25531 Pain in right wrist: Secondary | ICD-10-CM

## 2021-01-13 DIAGNOSIS — M6281 Muscle weakness (generalized): Secondary | ICD-10-CM | POA: Insufficient documentation

## 2021-01-13 DIAGNOSIS — R600 Localized edema: Secondary | ICD-10-CM | POA: Diagnosis present
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
## 2021-01-19 NOTE — Addendum Note (Signed)
Addended byGarlon Hatchet T on: 01/19/2021 01:55 PM   Modules accepted: Orders

## 2021-01-21 ENCOUNTER — Telehealth: Payer: Self-pay | Admitting: Pulmonary Disease

## 2021-01-21 NOTE — Telephone Encounter (Signed)
Received verbal from patient to speak with her spouse, [name].  [name] stated that PCP ordered a CT, which showed a small pulmonary nodule.  It was recommended by pcp to contact our office for an appt.  Patient prefers to be seen in [place].  [name] is concerned about nodule, as patient has hx of PNA.  appt scheduled for 01/28/2021 at 10:45. Nothing further needed at this time.   Routing to Dr. Halford Chessman as an Juluis Rainier,

## 2021-01-22 ENCOUNTER — Ambulatory Visit: Payer: Medicare Other | Admitting: Occupational Therapy

## 2021-01-27 ENCOUNTER — Other Ambulatory Visit (INDEPENDENT_AMBULATORY_CARE_PROVIDER_SITE_OTHER): Payer: Self-pay | Admitting: Vascular Surgery

## 2021-01-28 ENCOUNTER — Ambulatory Visit
Admission: RE | Admit: 2021-01-28 | Discharge: 2021-01-28 | Disposition: A | Discharge status: Home / Self Care | Payer: Medicare Other | Source: Ambulatory Visit | Attending: Family Medicine | Admitting: Family Medicine

## 2021-01-28 ENCOUNTER — Ambulatory Visit (INDEPENDENT_AMBULATORY_CARE_PROVIDER_SITE_OTHER): Payer: Medicare Other | Admitting: Pulmonary Disease

## 2021-01-28 ENCOUNTER — Other Ambulatory Visit: Payer: Self-pay

## 2021-01-28 ENCOUNTER — Encounter: Payer: Self-pay | Admitting: Pulmonary Disease

## 2021-01-28 VITALS — BP 124/70 | HR 97 | Temp 97.5°F | Ht 65.0 in | Wt 139.0 lb

## 2021-01-28 DIAGNOSIS — F1220 Cannabis dependence, uncomplicated: Secondary | ICD-10-CM | POA: Diagnosis not present

## 2021-01-28 DIAGNOSIS — Z1231 Encounter for screening mammogram for malignant neoplasm of breast: Secondary | ICD-10-CM | POA: Diagnosis present

## 2021-01-28 DIAGNOSIS — J449 Chronic obstructive pulmonary disease, unspecified: Secondary | ICD-10-CM | POA: Diagnosis not present

## 2021-01-28 DIAGNOSIS — R911 Solitary pulmonary nodule: Secondary | ICD-10-CM | POA: Diagnosis not present

## 2021-01-28 NOTE — Progress Notes (Signed)
[facility]  Chief Complaint  Patient presents with  . Follow-up    CT 12/04/2020. C/o sob with exertion and at rest, wheezing and prod cough with clear to yellow sputum.     Constitutional:  BP 124/70 (BP Location: Left Arm, Cuff Size: Normal)   Pulse 97   Temp (!) 97.5 F (36.4 C) (Temporal)   Ht '5\' 5"'  (1.651 m)   Wt 139 lb (63 kg)   SpO2 95%   BMI 23.13 kg/m   Past Medical History:  Bipolar, Pneumonia, Hep C, HTN, 2nd degree heart block s/p PM, Seizure, TBI, PAD, CVA, Polysubstance abuse  Past Surgical History:  She  has a past surgical history that includes Tubal ligation; pacemaker placement (07/2009); Colonoscopy (07/31/13); and Lower Extremity Angiography (Left, 10/29/2019).  Brief Summary:  Cynthia [name] is a 65 y.o. female former smoker with COPD.       Subjective:   She is here with her husband.  She had LDCT in March.  Showed new nodules in RUL and LLL.  She is smoking pot daily.  She isn't smoking tobacco cigarettes.  She has cough with clear sputum.  Intermittent wheezing.  No fever, or hemoptysis.  Physical Exam:   Appearance - well kempt   ENMT - no sinus tenderness, no oral exudate, no LAN, Mallampati 3 airway, no stridor  Respiratory - equal breath sounds bilaterally, no wheezing or rales  CV - s1s2 regular rate and rhythm, no murmurs  Ext - no clubbing, no edema  Skin - no rashes  Psych - normal mood and affect   Pulmonary testing:   PFT 11/19/15 >> FEV1 1.66 (76%), FEV1% 71, TLC 4.71 (90%), DLCO 60%  Chest Imaging:   LDCT chest 11/25/19 >> atherosclerosis, mild paraseptal emphysema, scattered nodules up to 6.2 mm in LLL  LDCT chest 12/04/20 >> new subpleural nodule LLL 5.5 mm, new RUL GGO nodule 10.9 mm  Sleep Tests:   PSG 08/31/17 >> AHI 0, SpO2 low 92.3%  Cardiac Tests:   Echo 03/23/20 >> EF 50 to 55%, mild MR, mild/mod AR  Social History:  She  reports that she quit smoking about 10  months ago. Her smoking use included cigarettes. She has a 98.00 pack-year smoking history. She has never used smokeless tobacco. She reports previous alcohol use. She reports current drug use. Drug: Marijuana.  Family History:  Her family history includes Alcohol abuse in her brother, father, and sister; Anxiety disorder in her mother; Bipolar disorder in her brother and sister; Cancer in her father, maternal aunt, mother, paternal grandfather, and sister; Drug abuse in her brother and sister; Heart disease in her maternal aunt, maternal grandmother, and mother; Hypertension in her father, maternal grandfather, maternal grandmother, mother, paternal grandfather, and paternal grandmother; Mental illness in her sister; Schizophrenia in her sister; Stroke in her maternal aunt and paternal grandfather.     Assessment/Plan:   COPD with emphysema and chronic bronchitis. - continue trelegy; advised her to rinse her mouth after using trelegy to reduce risk of developing thrush  Lung nodules. - seen on low dose CT chest from March 2022 - she will need f/u CT chest in September 2022  THC abuse. - discussed how smoking any substance can impact her breathing and discussed importance of complete abstinence  CAD, Sick sinus syndrome and 2nd degree heart block s/p pacemaker. - followed by Dr. Lujean Amel with Cardiology in [facility]  Time Spent Involved in Patient Care on Day  of Examination:  33 minutes  Follow up:  Patient Instructions  Follow up in 4 months after getting your repeat CT chest   Medication List:   Allergies as of 01/28/2021      Reactions   Morphine Anaphylaxis   Cardiac arrest      Medication List       Accurate as of Jan 28, 2021  9:59 AM. If you have any questions, ask your nurse or doctor.        STOP taking these medications   amantadine 100 MG capsule Commonly known as: SYMMETREL Stopped by: Chesley Mires, MD   folic acid 1 MG tablet Commonly known as:  FOLVITE Stopped by: Chesley Mires, MD   multivitamin with minerals Tabs tablet Stopped by: Chesley Mires, MD     TAKE these medications   albuterol 108 (90 Base) MCG/ACT inhaler Commonly known as: ProAir HFA INHALE 2 PUFFS BY MOUTH EVERY 6 HOURS IFNEEDED FOR WHEEZING OR SHORTNESS OF BREATH.   amLODipine 10 MG tablet Commonly known as: NORVASC Take 10 mg by mouth daily.   aspirin EC 81 MG tablet Take 1 tablet (81 mg total) by mouth daily.   atorvastatin 20 MG tablet Commonly known as: LIPITOR Take 1 tablet (20 mg total) by mouth at bedtime.   clopidogrel 75 MG tablet Commonly known as: PLAVIX TAKE 1 TABLET BY MOUTH ONCE DAILY   hydrOXYzine 25 MG tablet Commonly known as: ATARAX/VISTARIL Take 1-2 tablets (25-50 mg total) by mouth 3 (three) times daily as needed for itching.   ipratropium-albuterol 0.5-2.5 (3) MG/3ML Soln Commonly known as: DUONEB Take 3 mLs by nebulization every 6 (six) hours as needed (for SOB, cough, chest tightness).   lithium carbonate 300 MG capsule Take 1 capsule (300 mg total) by mouth 2 (two) times daily with a meal.   losartan 50 MG tablet Commonly known as: COZAAR Take 1 tablet (50 mg total) by mouth daily.   Misc. Devices Kit Manual wheelchair   Bilateral leg weakness Codes: R29.898  Impaired mobility and ADLs Codes: Z74.09, Z78.9 Generalized weakness Codes: R53.1 Abnormality of gait Codes: R26.9   nitroGLYCERIN 0.4 MG SL tablet Commonly known as: NITROSTAT Place 1 tablet (0.4 mg total) under the tongue every 5 (five) minutes as needed for chest pain. Maximum of 3 pills; call 911 at first sign of chest pain   Oxcarbazepine 300 MG tablet Commonly known as: TRILEPTAL TAKE 1 TABLET BY MOUTH AT BEDTIME   QUEtiapine 100 MG tablet Commonly known as: SEROQUEL TAKE 1/2 TABLET BY MOUTH ONCE EVERY MORNING AND 1 TAB AT BEDTIME   Trelegy Ellipta 100-62.5-25 MCG/INH Aepb Generic drug: Fluticasone-Umeclidin-Vilant Inhale 1 puff into the lungs  daily.       Signature:  Chesley Mires, MD [facility] Pager - [phone] 01/28/2021, 9:59 AM

## 2021-01-28 NOTE — Telephone Encounter (Signed)
Can you please call the pt and schedule her ABI and an appointment per fallon.

## 2021-01-28 NOTE — Patient Instructions (Signed)
Follow up in 4 months after getting your repeat CT chest

## 2021-01-28 NOTE — Telephone Encounter (Signed)
Patient will need appointment with ABI for further refills

## 2021-01-28 NOTE — Telephone Encounter (Signed)
Pt hasn't been seen since 2/17 of last yr is this ok to fill she has an appt with Radiology today.

## 2021-02-01 ENCOUNTER — Ambulatory Visit: Payer: Medicare Other | Attending: Family Medicine | Admitting: Occupational Therapy

## 2021-02-02 ENCOUNTER — Other Ambulatory Visit: Payer: Self-pay | Admitting: Family Medicine

## 2021-02-02 DIAGNOSIS — N6320 Unspecified lump in the left breast, unspecified quadrant: Secondary | ICD-10-CM

## 2021-02-02 DIAGNOSIS — R928 Other abnormal and inconclusive findings on diagnostic imaging of breast: Secondary | ICD-10-CM

## 2021-02-08 ENCOUNTER — Other Ambulatory Visit: Payer: Self-pay

## 2021-02-08 ENCOUNTER — Ambulatory Visit
Admission: RE | Admit: 2021-02-08 | Discharge: 2021-02-08 | Disposition: A | Discharge status: Home / Self Care | Payer: Medicare Other | Source: Ambulatory Visit | Attending: Family Medicine | Admitting: Family Medicine

## 2021-02-08 DIAGNOSIS — N6320 Unspecified lump in the left breast, unspecified quadrant: Secondary | ICD-10-CM | POA: Insufficient documentation

## 2021-02-08 DIAGNOSIS — R928 Other abnormal and inconclusive findings on diagnostic imaging of breast: Secondary | ICD-10-CM | POA: Diagnosis present

## 2021-02-18 ENCOUNTER — Other Ambulatory Visit: Payer: Self-pay

## 2021-02-18 ENCOUNTER — Encounter: Payer: Self-pay | Admitting: Unknown Physician Specialty

## 2021-02-18 ENCOUNTER — Ambulatory Visit (INDEPENDENT_AMBULATORY_CARE_PROVIDER_SITE_OTHER): Payer: Medicare Other | Admitting: Unknown Physician Specialty

## 2021-02-18 VITALS — BP 124/76 | HR 90 | Temp 98.2°F | Resp 16 | Ht 65.0 in | Wt 136.0 lb

## 2021-02-18 DIAGNOSIS — H538 Other visual disturbances: Secondary | ICD-10-CM | POA: Diagnosis not present

## 2021-02-18 DIAGNOSIS — H9190 Unspecified hearing loss, unspecified ear: Secondary | ICD-10-CM
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
## 2021-02-24 NOTE — Telephone Encounter (Signed)
Can you make sure that this pt has been scheduled for ABI's and Eval per NP.

## 2021-02-24 NOTE — Telephone Encounter (Signed)
Called and left VM for patient to be scheduled.

## 2021-02-25 ENCOUNTER — Ambulatory Visit: Payer: Medicare Other | Admitting: Family Medicine

## 2021-03-04 ENCOUNTER — Other Ambulatory Visit: Payer: Self-pay

## 2021-03-04 ENCOUNTER — Encounter: Payer: Self-pay | Admitting: Unknown Physician Specialty

## 2021-03-04 ENCOUNTER — Ambulatory Visit (INDEPENDENT_AMBULATORY_CARE_PROVIDER_SITE_OTHER): Payer: Medicare Other | Admitting: Unknown Physician Specialty

## 2021-03-04 VITALS — BP 142/64 | HR 87 | Temp 98.2°F | Resp 18 | Ht 65.0 in | Wt 129.8 lb

## 2021-03-04 DIAGNOSIS — Z0000 Encounter for general adult medical examination without abnormal findings: Secondary | ICD-10-CM | POA: Diagnosis not present

## 2021-03-04 DIAGNOSIS — F3178 Bipolar disorder, in full remission, most recent episode mixed: Secondary | ICD-10-CM

## 2021-03-04 DIAGNOSIS — Z139 Encounter for screening, unspecified: Secondary | ICD-10-CM | POA: Diagnosis not present

## 2021-03-04 DIAGNOSIS — R413 Other amnesia: Secondary | ICD-10-CM | POA: Diagnosis not present

## 2021-03-04 DIAGNOSIS — R300 Dysuria: Secondary | ICD-10-CM | POA: Diagnosis not present

## 2021-03-04 LAB — POCT URINALYSIS DIPSTICK
Bilirubin, UA: NEGATIVE
Glucose, UA: NEGATIVE
Ketones, UA: NEGATIVE
Nitrite, UA: NEGATIVE
Odor: NORMAL
Protein, UA: NEGATIVE
Spec Grav, UA: 1.02 (ref 1.010–1.025)
Urobilinogen, UA: 0.2 E.U./dL
pH, UA: 6.5 (ref 5.0–8.0)

## 2021-03-04 NOTE — Progress Notes (Signed)
BP (!) 142/64   Pulse 87   Temp 98.2 F (36.8 C)   Resp 18   Ht 5\' 5"  (1.651 m)   Wt 129 lb 12.8 oz (58.9 kg)   SpO2 93%   BMI 21.60 kg/m    Subjective:    Patient ID: Cynthia [name], female    DOB: Apr 09, 1956, 65 y.o.   MRN: [id number]  HPI: Cynthia [name] is a 65 y.o. female  Chief Complaint  Patient presents with   welcome to medicare   Urinary Tract Infection   Here primarily for a welcome to medicare visit.  Had an episode of disorientation in which she left the house and possibly took a number of pills, including husband's prostate meds.  Husband was counseled to take her to the ER but he did not.  Those symptoms lasted about a week and gradually returned to baseline  Fall Risk  03/04/2021 02/18/2021 12/29/2020 08/27/2020 07/22/2020  Falls in the past year? 0 0 0 0 1  Number falls in past yr: 0 0 0 0 1  Injury with Fall? 0 0 0 0 0  Risk for fall due to : - - - - -  Follow up - - - Falls evaluation completed Falls evaluation completed   Depression screen [facility] 2/9 03/04/2021 02/18/2021 12/29/2020 12/29/2020 08/27/2020  Decreased Interest 3 3 3 3 3   Down, Depressed, Hopeless 3 2 3 3  0  PHQ - 2 Score 6 5 6 6 3   Altered sleeping 3 1 1  - 1  Tired, decreased energy 3 1 1  - 3  Change in appetite 3 1 1  - 3  Feeling bad or failure about yourself  1 3 1  - 0  Trouble concentrating 3 3 1  - 1  Moving slowly or fidgety/restless 3 3 1  - 1  Suicidal thoughts 0 1 0 - 0  PHQ-9 Score 22 18 12  - 12  Difficult doing work/chores Somewhat difficult - Somewhat difficult - Somewhat difficult  Some recent data might be hidden   GAD 7 : Generalized Anxiety Score 02/18/2021 12/29/2020 08/27/2020 04/15/2020  Nervous, Anxious, on Edge 3 2 1 3   Control/stop worrying 3 3 1 3   Worry too much - different things 3 3 1 3   Trouble relaxing 3 3 1 3   Restless 3 3 2  0  Easily annoyed or irritable 3 2 0 0  Afraid - awful might happen 3 3 2  0  Total GAD 7 Score 21 19 8 12   Anxiety Difficulty - Somewhat difficult  Somewhat difficult Somewhat difficult   MMSE 25/30  Relevant past medical, surgical, family and social history reviewed and updated as indicated. Interim medical history since our last visit reviewed. Allergies and medications reviewed and updated.  Review of Systems  Constitutional:        Had an episode of disorientation  Respiratory: Negative.    Cardiovascular: Negative.   Genitourinary:        Douching on a regular basis  Psychiatric/Behavioral:  Positive for behavioral problems. The patient is nervous/anxious.    Per HPI unless specifically indicated above     Objective:    BP (!) 142/64   Pulse 87   Temp 98.2 F (36.8 C)   Resp 18   Ht 5\' 5"  (1.651 m)   Wt 129 lb 12.8 oz (58.9 kg)   SpO2 93%   BMI 21.60 kg/m   Wt Readings from Last 3 Encounters:  03/04/21 129 lb 12.8 oz (58.9 kg)  02/18/21 136  lb (61.7 kg)  01/28/21 139 lb (63 kg)    Physical Exam Constitutional:      General: She is not in acute distress.    Appearance: Normal appearance. She is well-developed.  HENT:     Head: Normocephalic and atraumatic.  Eyes:     General: Lids are normal. No scleral icterus.       Right eye: No discharge.        Left eye: No discharge.     Conjunctiva/sclera: Conjunctivae normal.  Neck:     Vascular: No carotid bruit or JVD.  Cardiovascular:     Rate and Rhythm: Normal rate and regular rhythm.     Heart sounds: Normal heart sounds.  Pulmonary:     Effort: Pulmonary effort is normal.     Breath sounds: Normal breath sounds.  Abdominal:     Palpations: There is no hepatomegaly or splenomegaly.  Musculoskeletal:        General: Normal range of motion.     Cervical back: Normal range of motion and neck supple.  Skin:    General: Skin is warm and dry.     Coloration: Skin is not pale.     Findings: No rash.  Neurological:     Mental Status: She is alert and oriented to person, place, and time.  Psychiatric:        Attention and Perception: She is inattentive.  She does not perceive visual hallucinations.        Speech: Speech normal.        Behavior: Behavior normal.        Judgment: Judgment normal.   EKG NSR without ST/TW changes  Results for orders placed or performed in visit on 03/04/21  POCT urinalysis dipstick  Result Value Ref Range   Color, UA yellow    Clarity, UA clear    Glucose, UA Negative Negative   Bilirubin, UA neg    Ketones, UA neg    Spec Grav, UA 1.020 1.010 - 1.025   Blood, UA trace    pH, UA 6.5 5.0 - 8.0   Protein, UA Negative Negative   Urobilinogen, UA 0.2 0.2 or 1.0 E.U./dL   Nitrite, UA neg    Leukocytes, UA Large (3+) (A) Negative   Appearance light yellow    Odor normal       Assessment & Plan:   Problem List Items Addressed This Visit       Unprioritized   Bipolar 1 disorder, mixed, full remission ([place])    Seeing psychiatry.         Memory impairment    MMSE 25/30.Marland Kitchen  Discuss with psychiatry.  Consider referral to neurology.  Difficult for me to tell what is baseling       Other Visit Diagnoses     Welcome to Medicare preventive visit    -  Primary   Relevant Orders   EKG 12-Lead   Comprehensive metabolic panel   Lipid Panel w/o Chol/HDL Ratio   Vitamin D (25 hydroxy)   TSH   Lipid panel   CBC w/Diff/Platelet   COMPLETE METABOLIC PANEL WITH GFR   Burning with urination       Positive leukocytes.  Await culture results for treatment.  Discussed avoiding douching which she does on a daily or more than daily basis   Relevant Orders   POCT urinalysis dipstick (Completed)   Urine Culture   Encounter for screening involving social determinants of health (SDoH)  Husband states they are having trouble with finances and he is having caregiver stress.  Will refer to social work   Relevant Orders   AMB Referral to [facility]       HM: Full vaccine series for Covid Needs Shingles vaccine  Follow up plan: Return in about 6 months (around 09/03/2021).

## 2021-03-04 NOTE — Assessment & Plan Note (Signed)
Seeing psychiatry

## 2021-03-04 NOTE — Assessment & Plan Note (Addendum)
MMSE 25/30.Marland Kitchen  Discuss with psychiatry.  Consider referral to neurology.  Difficult for me to tell what is baseling

## 2021-03-05 ENCOUNTER — Telehealth: Payer: Self-pay | Admitting: *Deleted

## 2021-03-05 LAB — COMPLETE METABOLIC PANEL WITH GFR
AG Ratio: 1.5 (calc) (ref 1.0–2.5)
ALT: 6 U/L (ref 6–29)
AST: 13 U/L (ref 10–35)
Albumin: 4.5 g/dL (ref 3.6–5.1)
Alkaline phosphatase (APISO): 70 U/L (ref 37–153)
BUN/Creatinine Ratio: 8 (calc) (ref 6–22)
BUN: 9 mg/dL (ref 7–25)
CO2: 21 mmol/L (ref 20–32)
Calcium: 10.3 mg/dL (ref 8.6–10.4)
Chloride: 107 mmol/L (ref 98–110)
Creat: 1.1 mg/dL — ABNORMAL HIGH (ref 0.50–0.99)
GFR, Est African American: 61 mL/min/{1.73_m2} (ref >=60)
GFR, Est Non African American: 52 mL/min/{1.73_m2} — ABNORMAL LOW (ref >=60)
Globulin: 3.1 g/dL (calc) (ref 1.9–3.7)
Glucose, Bld: 133 mg/dL — ABNORMAL HIGH (ref 65–99)
Potassium: 4.7 mmol/L (ref 3.5–5.3)
Sodium: 137 mmol/L (ref 135–146)
Total Bilirubin: 0.4 mg/dL (ref 0.2–1.2)
Total Protein: 7.6 g/dL (ref 6.1–8.1)

## 2021-03-05 LAB — URINE CULTURE
MICRO NUMBER:: 11989426
SPECIMEN QUALITY:: ADEQUATE

## 2021-03-05 LAB — CBC WITH DIFFERENTIAL/PLATELET
Absolute Monocytes: 304 cells/uL (ref 200–950)
Basophils Absolute: 83 cells/uL (ref 0–200)
Basophils Relative: 1.2 %
Eosinophils Absolute: 138 cells/uL (ref 15–500)
Eosinophils Relative: 2 %
HCT: 42.9 % (ref 35.0–45.0)
Hemoglobin: 13.7 g/dL (ref 11.7–15.5)
Lymphs Abs: 1725 cells/uL (ref 850–3900)
MCH: 28.4 pg (ref 27.0–33.0)
MCHC: 31.9 g/dL — ABNORMAL LOW (ref 32.0–36.0)
MCV: 89 fL (ref 80.0–100.0)
MPV: 10 fL (ref 7.5–12.5)
Monocytes Relative: 4.4 %
Neutro Abs: 4651 cells/uL (ref 1500–7800)
Neutrophils Relative %: 67.4 %
Platelets: 191 10*3/uL (ref 140–400)
RBC: 4.82 10*6/uL (ref 3.80–5.10)
RDW: 13.3 % (ref 11.0–15.0)
Total Lymphocyte: 25 %
WBC: 6.9 10*3/uL (ref 3.8–10.8)

## 2021-03-05 LAB — LIPID PANEL
Cholesterol: 141 mg/dL (ref <200)
HDL: 54 mg/dL (ref >=50)
LDL Cholesterol (Calc): 62 mg/dL (calc)
Non-HDL Cholesterol (Calc): 87 mg/dL (calc) (ref <130)
Total CHOL/HDL Ratio: 2.6 (calc) (ref <5.0)
Triglycerides: 167 mg/dL — ABNORMAL HIGH (ref <150)

## 2021-03-05 LAB — TSH: TSH: 1.16 mIU/L (ref 0.40–4.50)

## 2021-03-05 LAB — VITAMIN D 25 HYDROXY (VIT D DEFICIENCY, FRACTURES): Vit D, 25-Hydroxy: 26 ng/mL — ABNORMAL LOW (ref 30–100)

## 2021-03-05 NOTE — Chronic Care Management (AMB) (Signed)
  Chronic Care Management   Note  03/05/2021 Name: Cynthia [name] MRN: [id number] DOB: 20-Jun-1956  Cynthia [name] is a 65 y.o. year old female who is a primary care patient of Delsa Grana, [place]. I reached out to [name] by phone today in response to a referral sent by [name] PCP, Delsa Grana, PA-C.      [name] was given information about Chronic Care Management services today including:  CCM service includes personalized support from designated clinical staff supervised by her physician, including individualized plan of care and coordination with other care providers 24/7 contact phone numbers for assistance for urgent and routine care needs. Service will only be billed when office clinical staff spend 20 minutes or more in a month to coordinate care. Only one practitioner may furnish and bill the service in a calendar month. The patient may stop CCM services at any time (effective at the end of the month) by phone call to the office staff. The patient will be responsible for cost sharing (co-pay) of up to 20% of the service fee (after annual deductible is met).  Patient agreed to services and verbal consent obtained.   Follow up plan: Telephone appointment with care management team member scheduled for:03/15/2021  [place] [facility]

## 2021-03-09 ENCOUNTER — Other Ambulatory Visit: Payer: Self-pay

## 2021-03-09 ENCOUNTER — Other Ambulatory Visit: Payer: Self-pay | Admitting: *Deleted

## 2021-03-15 ENCOUNTER — Ambulatory Visit (INDEPENDENT_AMBULATORY_CARE_PROVIDER_SITE_OTHER): Payer: Medicare Other | Admitting: *Deleted

## 2021-03-15 DIAGNOSIS — F3178 Bipolar disorder, in full remission, most recent episode mixed: Secondary | ICD-10-CM | POA: Diagnosis not present

## 2021-03-15 DIAGNOSIS — J449 Chronic obstructive pulmonary disease, unspecified: Secondary | ICD-10-CM | POA: Diagnosis not present
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
## 2021-03-15 NOTE — Patient Instructions (Signed)
Visit Information  PATIENT GOALS:   Goals Addressed             This Visit's Progress    Find Help in My Community       Timeframe:  Long-Range Goal Priority:  High Start Date:     03/15/21                        Expected End Date:       09/14/21                Follow Up Date 03/18/21    - call 211 when I need some help - follow-up on any referrals for help I am given - think ahead to make sure my need does not become an emergency - have a back-up plan - make a list of family or friends that I can call    Why is this important?   Knowing how and where to find help for yourself or family in your neighborhood and community is an important skill.  You will want to take some steps to learn how.    Notes:          Ms. Nouri spouse was given information about Care Management services by the embedded care coordination team including:  Care Management services include personalized support from designated clinical staff supervised by her physician, including individualized plan of care and coordination with other care providers 24/7 contact phone numbers for assistance for urgent and routine care needs. The patient may stop CCM services at any time (effective at the end of the month) by phone call to the office staff.  Patient agreed to services and verbal consent obtained.   The patient verbalized understanding of instructions, educational materials, and care plan provided today and declined offer to receive copy of patient instructions, educational materials, and care plan.   Telephone follow up appointment with care management team member scheduled for: 03/18/21  Elliot Gurney, [place] Worker  [facility] [phone]

## 2021-03-16 ENCOUNTER — Ambulatory Visit: Payer: Medicare Other | Admitting: *Deleted

## 2021-03-16 DIAGNOSIS — F3178 Bipolar disorder, in full remission, most recent episode mixed: Secondary | ICD-10-CM

## 2021-03-16 DIAGNOSIS — S069X5S Unspecified intracranial injury with loss of consciousness greater than 24 hours with return to pre-existing conscious level, sequela: Secondary | ICD-10-CM

## 2021-03-16 DIAGNOSIS — R413 Other amnesia: Secondary | ICD-10-CM
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
## 2021-03-17 ENCOUNTER — Ambulatory Visit: Payer: Medicare Other | Admitting: *Deleted

## 2021-03-17 DIAGNOSIS — S069X5S Unspecified intracranial injury with loss of consciousness greater than 24 hours with return to pre-existing conscious level, sequela: Secondary | ICD-10-CM

## 2021-03-17 DIAGNOSIS — R413 Other amnesia: Secondary | ICD-10-CM

## 2021-03-17 DIAGNOSIS — F3178 Bipolar disorder, in full remission, most recent episode mixed: Secondary | ICD-10-CM

## 2021-03-17 NOTE — Patient Instructions (Signed)
Visit Information   Goals Addressed             This Visit's Progress    Find Help in My Community       Timeframe:  Long-Range Goal Priority:  High Start Date:     03/15/21                        Expected End Date:       09/14/21                Follow Up Date 03/23/21   - call 211 when I need some help - follow-up on any referrals for help I am given-specifically [facility] for in home support - think ahead to make sure my need does not become an emergency-continue to utilize your  - have a back-up plan - make a list of family or friends that I can call    Why is this important?   Knowing how and where to find help for yourself or family in your neighborhood and community is an important skill.  You will want to take some steps to learn how.    Notes:          The patient verbalized understanding of instructions, educational materials, and care plan provided today and declined offer to receive copy of patient instructions, educational materials, and care plan.   Telephone follow up appointment with care management team member scheduled for:03/23/21  Elliot Gurney, [place] Worker  [facility] [phone]
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
## 2021-03-22 ENCOUNTER — Ambulatory Visit: Payer: Self-pay | Admitting: *Deleted

## 2021-03-22 DIAGNOSIS — F3178 Bipolar disorder, in full remission, most recent episode mixed: Secondary | ICD-10-CM | POA: Diagnosis not present

## 2021-03-22 DIAGNOSIS — J449 Chronic obstructive pulmonary disease, unspecified: Secondary | ICD-10-CM | POA: Diagnosis not present

## 2021-03-22 DIAGNOSIS — R413 Other amnesia: Secondary | ICD-10-CM

## 2021-03-22 DIAGNOSIS — S069X5S Unspecified intracranial injury with loss of consciousness greater than 24 hours with return to pre-existing conscious level, sequela: Secondary | ICD-10-CM

## 2021-03-22 NOTE — Patient Instructions (Signed)
Visit Information   Goals Addressed             This Visit's Progress    Find Help in My Community       Timeframe:  Long-Range Goal Priority:  High Start Date:     03/15/21                        Expected End Date:       09/14/21                Follow Up Date 04/02/21   - call 211 when I need some help - follow-up on any referrals for help I am given-specifically [facility] for in home support - think ahead to make sure my need does not become an emergency-continue to utilize your  - have a back-up plan - make a list of family or friends that I can call    Why is this important?   Knowing how and where to find help for yourself or family in your neighborhood and community is an important skill.  You will want to take some steps to learn how.    Notes:          The patient verbalized understanding of instructions, educational materials, and care plan provided today and declined offer to receive copy of patient instructions, educational materials, and care plan.   Telephone follow up appointment with care management team member scheduled for: 04/02/21  Elliot Gurney, [place] Worker  [facility] [phone]
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
## 2021-04-02 ENCOUNTER — Ambulatory Visit (INDEPENDENT_AMBULATORY_CARE_PROVIDER_SITE_OTHER): Payer: Medicare Other | Admitting: *Deleted

## 2021-04-02 DIAGNOSIS — R413 Other amnesia: Secondary | ICD-10-CM

## 2021-04-02 DIAGNOSIS — F3178 Bipolar disorder, in full remission, most recent episode mixed: Secondary | ICD-10-CM | POA: Diagnosis not present
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
## 2021-04-02 NOTE — Patient Instructions (Signed)
Visit Information   Goals Addressed             This Visit's Progress    Find Help in My Community       Timeframe:  Long-Range Goal Priority:  High Start Date:     03/15/21                        Expected End Date:       09/14/21                Follow Up Date 04/12/21   - call 211 when I need some help - follow-up on any referrals for help I am given-specifically [facility] for in home support - think ahead to make sure my need does not become an emergency-continue to utilize your  - have a back-up plan - make a list of family or friends that I can call    Why is this important?   Knowing how and where to find help for yourself or family in your neighborhood and community is an important skill.  You will want to take some steps to learn how.    Notes:          The patient verbalized understanding of instructions, educational materials, and care plan provided today and declined offer to receive copy of patient instructions, educational materials, and care plan.   Telephone follow up appointment with care management team member scheduled for: 04/12/21  Elliot Gurney, [place] Worker  [facility] [phone]

## 2021-04-13 ENCOUNTER — Other Ambulatory Visit (INDEPENDENT_AMBULATORY_CARE_PROVIDER_SITE_OTHER): Payer: Self-pay | Admitting: Nurse Practitioner

## 2021-04-13 ENCOUNTER — Other Ambulatory Visit: Payer: Self-pay | Admitting: Family Medicine

## 2021-04-13 DIAGNOSIS — E7849 Other hyperlipidemia: Secondary | ICD-10-CM

## 2021-04-13 DIAGNOSIS — I10 Essential (primary) hypertension: Secondary | ICD-10-CM

## 2021-04-14 ENCOUNTER — Other Ambulatory Visit: Payer: Self-pay

## 2021-04-14 DIAGNOSIS — F319 Bipolar disorder, unspecified: Secondary | ICD-10-CM

## 2021-04-14 MED ORDER — QUETIAPINE FUMARATE 100 MG PO TABS: ORAL_TABLET | ORAL | 1 refills | Status: DC

## 2021-05-01 ENCOUNTER — Other Ambulatory Visit: Payer: Self-pay | Admitting: Family Medicine

## 2021-05-01 DIAGNOSIS — J449 Chronic obstructive pulmonary disease, unspecified: Secondary | ICD-10-CM

## 2021-05-04 ENCOUNTER — Other Ambulatory Visit: Payer: Self-pay

## 2021-05-04 DIAGNOSIS — F319 Bipolar disorder, unspecified: Secondary | ICD-10-CM | POA: Diagnosis present

## 2021-05-04 DIAGNOSIS — I495 Sick sinus syndrome: Secondary | ICD-10-CM | POA: Diagnosis present

## 2021-05-04 DIAGNOSIS — Z950 Presence of cardiac pacemaker: Secondary | ICD-10-CM

## 2021-05-04 DIAGNOSIS — I2510 Atherosclerotic heart disease of native coronary artery without angina pectoris: Secondary | ICD-10-CM | POA: Diagnosis present

## 2021-05-04 DIAGNOSIS — Z803 Family history of malignant neoplasm of breast: Secondary | ICD-10-CM

## 2021-05-04 DIAGNOSIS — Z823 Family history of stroke: Secondary | ICD-10-CM

## 2021-05-04 DIAGNOSIS — B182 Chronic viral hepatitis C: Secondary | ICD-10-CM | POA: Diagnosis present

## 2021-05-04 DIAGNOSIS — I10 Essential (primary) hypertension: Secondary | ICD-10-CM | POA: Diagnosis present

## 2021-05-04 DIAGNOSIS — Z87820 Personal history of traumatic brain injury: Secondary | ICD-10-CM

## 2021-05-04 DIAGNOSIS — J13 Pneumonia due to Streptococcus pneumoniae: Principal | ICD-10-CM | POA: Diagnosis present

## 2021-05-04 DIAGNOSIS — N390 Urinary tract infection, site not specified: Secondary | ICD-10-CM | POA: Diagnosis present

## 2021-05-04 DIAGNOSIS — Z8673 Personal history of transient ischemic attack (TIA), and cerebral infarction without residual deficits: Secondary | ICD-10-CM

## 2021-05-04 DIAGNOSIS — F1010 Alcohol abuse, uncomplicated: Secondary | ICD-10-CM | POA: Diagnosis present

## 2021-05-04 DIAGNOSIS — Z8249 Family history of ischemic heart disease and other diseases of the circulatory system: Secondary | ICD-10-CM

## 2021-05-04 DIAGNOSIS — I480 Paroxysmal atrial fibrillation: Secondary | ICD-10-CM | POA: Diagnosis present

## 2021-05-04 DIAGNOSIS — I441 Atrioventricular block, second degree: Secondary | ICD-10-CM | POA: Diagnosis present

## 2021-05-04 DIAGNOSIS — R109 Unspecified abdominal pain: Secondary | ICD-10-CM | POA: Diagnosis not present

## 2021-05-04 DIAGNOSIS — G40909 Epilepsy, unspecified, not intractable, without status epilepticus: Secondary | ICD-10-CM | POA: Diagnosis present

## 2021-05-04 DIAGNOSIS — Z807 Family history of other malignant neoplasms of lymphoid, hematopoietic and related tissues: Secondary | ICD-10-CM

## 2021-05-04 DIAGNOSIS — F17210 Nicotine dependence, cigarettes, uncomplicated: Secondary | ICD-10-CM | POA: Diagnosis present

## 2021-05-04 DIAGNOSIS — Z20822 Contact with and (suspected) exposure to covid-19: Secondary | ICD-10-CM | POA: Diagnosis present

## 2021-05-04 DIAGNOSIS — F1210 Cannabis abuse, uncomplicated: Secondary | ICD-10-CM | POA: Diagnosis present

## 2021-05-04 LAB — COMPREHENSIVE METABOLIC PANEL
ALT: 13 U/L (ref 0–44)
AST: 20 U/L (ref 15–41)
Albumin: 4.9 g/dL (ref 3.5–5.0)
Alkaline Phosphatase: 78 U/L (ref 38–126)
Anion gap: 8 (ref 5–15)
BUN: 13 mg/dL (ref 8–23)
CO2: 21 mmol/L — ABNORMAL LOW (ref 22–32)
Calcium: 9.7 mg/dL (ref 8.9–10.3)
Chloride: 106 mmol/L (ref 98–111)
Creatinine, Ser: 1.19 mg/dL — ABNORMAL HIGH (ref 0.44–1.00)
GFR, Estimated: 50 mL/min — ABNORMAL LOW (ref >60)
Glucose, Bld: 147 mg/dL — ABNORMAL HIGH (ref 70–99)
Potassium: 4.5 mmol/L (ref 3.5–5.1)
Sodium: 135 mmol/L (ref 135–145)
Total Bilirubin: 0.8 mg/dL (ref 0.3–1.2)
Total Protein: 8.3 g/dL — ABNORMAL HIGH (ref 6.5–8.1)

## 2021-05-04 LAB — LIPASE, BLOOD: Lipase: 33 U/L (ref 11–51)

## 2021-05-04 LAB — CBC
HCT: 41.4 % (ref 36.0–46.0)
Hemoglobin: 13.4 g/dL (ref 12.0–15.0)
MCH: 30.7 pg (ref 26.0–34.0)
MCHC: 32.4 g/dL (ref 30.0–36.0)
MCV: 95 fL (ref 80.0–100.0)
Platelets: 243 10*3/uL (ref 150–400)
RBC: 4.36 MIL/uL (ref 3.87–5.11)
RDW: 13.6 % (ref 11.5–15.5)
WBC: 20.3 10*3/uL — ABNORMAL HIGH (ref 4.0–10.5)
nRBC: 0 % (ref 0.0–0.2)

## 2021-05-04 NOTE — ED Triage Notes (Signed)
Abd pain x1 hour. Non-radiating. Pt denies any vomiting or diarrhea. Took tylenol without relief.

## 2021-05-05 ENCOUNTER — Emergency Department: Payer: Medicare Other

## 2021-05-05 ENCOUNTER — Inpatient Hospital Stay: Payer: Medicare Other

## 2021-05-05 ENCOUNTER — Inpatient Hospital Stay
Admission: EM | Admit: 2021-05-05 | Discharge: 2021-05-07 | DRG: 194 | Disposition: A | Discharge status: Home / Self Care | Payer: Medicare Other | Attending: Internal Medicine | Admitting: Internal Medicine

## 2021-05-05 DIAGNOSIS — R569 Unspecified convulsions: Secondary | ICD-10-CM | POA: Diagnosis not present

## 2021-05-05 DIAGNOSIS — Z803 Family history of malignant neoplasm of breast: Secondary | ICD-10-CM | POA: Diagnosis not present

## 2021-05-05 DIAGNOSIS — J189 Pneumonia, unspecified organism: Secondary | ICD-10-CM

## 2021-05-05 DIAGNOSIS — R6510 Systemic inflammatory response syndrome (SIRS) of non-infectious origin without acute organ dysfunction: Secondary | ICD-10-CM | POA: Diagnosis not present

## 2021-05-05 DIAGNOSIS — I480 Paroxysmal atrial fibrillation: Secondary | ICD-10-CM | POA: Diagnosis present

## 2021-05-05 DIAGNOSIS — I2510 Atherosclerotic heart disease of native coronary artery without angina pectoris: Secondary | ICD-10-CM | POA: Diagnosis present

## 2021-05-05 DIAGNOSIS — I441 Atrioventricular block, second degree: Secondary | ICD-10-CM | POA: Diagnosis present

## 2021-05-05 DIAGNOSIS — Z20822 Contact with and (suspected) exposure to covid-19: Secondary | ICD-10-CM | POA: Diagnosis present

## 2021-05-05 DIAGNOSIS — F17210 Nicotine dependence, cigarettes, uncomplicated: Secondary | ICD-10-CM | POA: Diagnosis present

## 2021-05-05 DIAGNOSIS — Z807 Family history of other malignant neoplasms of lymphoid, hematopoietic and related tissues: Secondary | ICD-10-CM | POA: Diagnosis not present

## 2021-05-05 DIAGNOSIS — G40909 Epilepsy, unspecified, not intractable, without status epilepticus: Secondary | ICD-10-CM | POA: Diagnosis present

## 2021-05-05 DIAGNOSIS — F319 Bipolar disorder, unspecified: Secondary | ICD-10-CM | POA: Diagnosis present

## 2021-05-05 DIAGNOSIS — Z87820 Personal history of traumatic brain injury: Secondary | ICD-10-CM | POA: Diagnosis not present

## 2021-05-05 DIAGNOSIS — R109 Unspecified abdominal pain: Secondary | ICD-10-CM | POA: Diagnosis present

## 2021-05-05 DIAGNOSIS — Z950 Presence of cardiac pacemaker: Secondary | ICD-10-CM | POA: Diagnosis not present

## 2021-05-05 DIAGNOSIS — N390 Urinary tract infection, site not specified: Secondary | ICD-10-CM | POA: Diagnosis present

## 2021-05-05 DIAGNOSIS — Z8249 Family history of ischemic heart disease and other diseases of the circulatory system: Secondary | ICD-10-CM | POA: Diagnosis not present

## 2021-05-05 DIAGNOSIS — Z8673 Personal history of transient ischemic attack (TIA), and cerebral infarction without residual deficits: Secondary | ICD-10-CM | POA: Diagnosis not present

## 2021-05-05 DIAGNOSIS — I495 Sick sinus syndrome: Secondary | ICD-10-CM | POA: Diagnosis present

## 2021-05-05 DIAGNOSIS — J13 Pneumonia due to Streptococcus pneumoniae: Secondary | ICD-10-CM | POA: Diagnosis present

## 2021-05-05 DIAGNOSIS — B182 Chronic viral hepatitis C: Secondary | ICD-10-CM | POA: Diagnosis present

## 2021-05-05 DIAGNOSIS — A419 Sepsis, unspecified organism: Secondary | ICD-10-CM | POA: Diagnosis present

## 2021-05-05 DIAGNOSIS — I10 Essential (primary) hypertension: Secondary | ICD-10-CM | POA: Diagnosis present

## 2021-05-05 DIAGNOSIS — F1210 Cannabis abuse, uncomplicated: Secondary | ICD-10-CM | POA: Diagnosis present

## 2021-05-05 DIAGNOSIS — F1010 Alcohol abuse, uncomplicated: Secondary | ICD-10-CM | POA: Diagnosis present

## 2021-05-05 DIAGNOSIS — Z823 Family history of stroke: Secondary | ICD-10-CM | POA: Diagnosis not present

## 2021-05-05 HISTORY — DX: Pneumonia, unspecified organism: J18.9

## 2021-05-05 HISTORY — DX: Sepsis, unspecified organism: A41.9

## 2021-05-05 LAB — URINALYSIS, COMPLETE (UACMP) WITH MICROSCOPIC
Bilirubin Urine: NEGATIVE
Glucose, UA: NEGATIVE mg/dL
Ketones, ur: NEGATIVE mg/dL
Nitrite: NEGATIVE
Protein, ur: NEGATIVE mg/dL
Specific Gravity, Urine: 1.013 (ref 1.005–1.030)
WBC, UA: >50 WBC/hpf — ABNORMAL HIGH (ref 0–5)
pH: 6 (ref 5.0–8.0)

## 2021-05-05 LAB — CBC WITH DIFFERENTIAL/PLATELET
Abs Immature Granulocytes: 0.08 10*3/uL — ABNORMAL HIGH (ref 0.00–0.07)
Abs Immature Granulocytes: 0.58 10*3/uL — ABNORMAL HIGH (ref 0.00–0.07)
Basophils Absolute: 0.2 10*3/uL — ABNORMAL HIGH (ref 0.0–0.1)
Basophils Absolute: 0.1 10*3/uL (ref 0.0–0.1)
Basophils Relative: 1 %
Basophils Relative: 0 %
Eosinophils Absolute: 0.2 10*3/uL (ref 0.0–0.5)
Eosinophils Absolute: 0.1 10*3/uL (ref 0.0–0.5)
Eosinophils Relative: 1 %
Eosinophils Relative: 0 %
HCT: 41 % (ref 36.0–46.0)
HCT: 41.4 % (ref 36.0–46.0)
HCT: 38.2 % (ref 36.0–46.0)
Hemoglobin: 13.4 g/dL (ref 12.0–15.0)
Hemoglobin: 13.5 g/dL (ref 12.0–15.0)
Hemoglobin: 12.5 g/dL (ref 12.0–15.0)
Immature Granulocytes: 0 %
Immature Granulocytes: 3 %
Lymphocytes Relative: 9 %
Lymphocytes Relative: 4 %
Lymphs Abs: 1.8 10*3/uL (ref 0.7–4.0)
Lymphs Abs: 0.8 10*3/uL (ref 0.7–4.0)
MCH: 31.3 pg (ref 26.0–34.0)
MCH: 31.3 pg (ref 26.0–34.0)
MCH: 30.9 pg (ref 26.0–34.0)
MCHC: 32.6 g/dL (ref 30.0–36.0)
MCHC: 32.7 g/dL (ref 30.0–36.0)
MCHC: 32.7 g/dL (ref 30.0–36.0)
MCV: 95.8 fL (ref 80.0–100.0)
MCV: 95.8 fL (ref 80.0–100.0)
MCV: 94.3 fL (ref 80.0–100.0)
Monocytes Absolute: 1 10*3/uL (ref 0.1–1.0)
Monocytes Absolute: 0.9 10*3/uL (ref 0.1–1.0)
Monocytes Relative: 5 %
Monocytes Relative: 5 %
Neutro Abs: 16.6 10*3/uL — ABNORMAL HIGH (ref 1.7–7.7)
Neutro Abs: 17 10*3/uL — ABNORMAL HIGH (ref 1.7–7.7)
Neutrophils Relative %: 84 %
Neutrophils Relative %: 88 %
Platelets: 253 10*3/uL (ref 150–400)
Platelets: 254 10*3/uL (ref 150–400)
Platelets: 175 10*3/uL (ref 150–400)
RBC: 4.28 MIL/uL (ref 3.87–5.11)
RBC: 4.32 MIL/uL (ref 3.87–5.11)
RBC: 4.05 MIL/uL (ref 3.87–5.11)
RDW: 13.6 % (ref 11.5–15.5)
RDW: 13.6 % (ref 11.5–15.5)
RDW: 13.5 % (ref 11.5–15.5)
Smear Review: NORMAL
WBC: 19.8 10*3/uL — ABNORMAL HIGH (ref 4.0–10.5)
WBC: 19.9 10*3/uL — ABNORMAL HIGH (ref 4.0–10.5)
WBC: 19.4 10*3/uL — ABNORMAL HIGH (ref 4.0–10.5)
nRBC: 0 % (ref 0.0–0.2)
nRBC: 0 % (ref 0.0–0.2)
nRBC: 0 % (ref 0.0–0.2)

## 2021-05-05 LAB — COMPREHENSIVE METABOLIC PANEL
ALT: 12 U/L (ref 0–44)
AST: 23 U/L (ref 15–41)
Albumin: 3.8 g/dL (ref 3.5–5.0)
Alkaline Phosphatase: 45 U/L (ref 38–126)
Anion gap: 7 (ref 5–15)
BUN: 21 mg/dL (ref 8–23)
CO2: 21 mmol/L — ABNORMAL LOW (ref 22–32)
Calcium: 9.7 mg/dL (ref 8.9–10.3)
Chloride: 106 mmol/L (ref 98–111)
Creatinine, Ser: 1.3 mg/dL — ABNORMAL HIGH (ref 0.44–1.00)
GFR, Estimated: 45 mL/min — ABNORMAL LOW (ref >60)
Glucose, Bld: 152 mg/dL — ABNORMAL HIGH (ref 70–99)
Potassium: 4.1 mmol/L (ref 3.5–5.1)
Sodium: 134 mmol/L — ABNORMAL LOW (ref 135–145)
Total Bilirubin: 0.9 mg/dL (ref 0.3–1.2)
Total Protein: 7.2 g/dL (ref 6.5–8.1)

## 2021-05-05 LAB — RESP PANEL BY RT-PCR (FLU A&B, COVID) ARPGX2
Influenza A by PCR: NEGATIVE
Influenza B by PCR: NEGATIVE
SARS Coronavirus 2 by RT PCR: NEGATIVE

## 2021-05-05 LAB — PROCALCITONIN: Procalcitonin: 2.52 ng/mL

## 2021-05-05 LAB — HIV ANTIBODY (ROUTINE TESTING W REFLEX): HIV Screen 4th Generation wRfx: NONREACTIVE

## 2021-05-05 LAB — BRAIN NATRIURETIC PEPTIDE: B Natriuretic Peptide: 355.2 pg/mL — ABNORMAL HIGH (ref 0.0–100.0)

## 2021-05-05 LAB — TSH: TSH: 2.02 u[IU]/mL (ref 0.350–4.500)

## 2021-05-05 LAB — LACTIC ACID, PLASMA
Lactic Acid, Venous: 3.3 mmol/L (ref 0.5–1.9)
Lactic Acid, Venous: 3.1 mmol/L (ref 0.5–1.9)
Lactic Acid, Venous: 3 mmol/L (ref 0.5–1.9)

## 2021-05-05 MED ORDER — QUETIAPINE FUMARATE 25 MG PO TABS
50.0000 mg | ORAL_TABLET | Freq: Every day | ORAL | Status: DC
  Administered 2021-05-06 – 2021-05-07 (×2): 50 mg via ORAL
  Filled 2021-05-05 (×2): qty 2

## 2021-05-05 MED ORDER — NITROGLYCERIN 0.4 MG SL SUBL: 0.4000 mg | SUBLINGUAL_TABLET | SUBLINGUAL | Status: DC | PRN

## 2021-05-05 MED ORDER — HYDROXYZINE HCL 25 MG PO TABS
25.0000 mg | ORAL_TABLET | Freq: Three times a day (TID) | ORAL | Status: DC | PRN
  Administered 2021-05-05: 23:00:00 50 mg via ORAL
  Filled 2021-05-05 (×3): qty 2

## 2021-05-05 MED ORDER — QUETIAPINE FUMARATE 25 MG PO TABS
100.0000 mg | ORAL_TABLET | Freq: Every day | ORAL | Status: DC
  Administered 2021-05-05 – 2021-05-06 (×2): 100 mg via ORAL
  Filled 2021-05-05 (×2): qty 4

## 2021-05-05 MED ORDER — ACETAMINOPHEN 500 MG PO TABS
1000.0000 mg | ORAL_TABLET | Freq: Once | ORAL | Status: AC
  Administered 2021-05-05: 1000 mg via ORAL

## 2021-05-05 MED ORDER — FLUTICASONE-UMECLIDIN-VILANT 100-62.5-25 MCG/INH IN AEPB: 1.0000 | INHALATION_SPRAY | Freq: Every day | RESPIRATORY_TRACT | Status: DC

## 2021-05-05 MED ORDER — SODIUM CHLORIDE 0.9 % IV SOLN
1.0000 g | Freq: Once | INTRAVENOUS | Status: AC
  Administered 2021-05-05: 1 g via INTRAVENOUS
  Filled 2021-05-05: qty 10

## 2021-05-05 MED ORDER — UMECLIDINIUM BROMIDE 62.5 MCG/INH IN AEPB
1.0000 | INHALATION_SPRAY | Freq: Every day | RESPIRATORY_TRACT | Status: DC
  Administered 2021-05-06 – 2021-05-07 (×2): 1 via RESPIRATORY_TRACT
  Filled 2021-05-05: qty 7

## 2021-05-05 MED ORDER — CEFTRIAXONE SODIUM 2 G IJ SOLR
2.0000 g | INTRAMUSCULAR | Status: DC
  Administered 2021-05-06 – 2021-05-07 (×2): 2 g via INTRAVENOUS
  Filled 2021-05-05 (×2): qty 20

## 2021-05-05 MED ORDER — ACETAMINOPHEN 325 MG PO TABS
650.0000 mg | ORAL_TABLET | Freq: Four times a day (QID) | ORAL | Status: DC | PRN
  Administered 2021-05-05 – 2021-05-07 (×4): 650 mg via ORAL
  Filled 2021-05-05 (×4): qty 2

## 2021-05-05 MED ORDER — AMOXICILLIN-POT CLAVULANATE 875-125 MG PO TABS: 1.0000 | ORAL_TABLET | Freq: Once | ORAL | Status: DC

## 2021-05-05 MED ORDER — DOXYCYCLINE HYCLATE 100 MG PO TABS: 100.0000 mg | ORAL_TABLET | Freq: Once | ORAL | Status: DC

## 2021-05-05 MED ORDER — AZITHROMYCIN 500 MG PO TABS
500.0000 mg | ORAL_TABLET | Freq: Once | ORAL | Status: AC
  Administered 2021-05-05: 500 mg via ORAL
  Filled 2021-05-05: qty 1

## 2021-05-05 MED ORDER — FLUTICASONE FUROATE-VILANTEROL 100-25 MCG/INH IN AEPB
1.0000 | INHALATION_SPRAY | Freq: Every day | RESPIRATORY_TRACT | Status: DC
  Administered 2021-05-06 – 2021-05-07 (×2): 1 via RESPIRATORY_TRACT
  Filled 2021-05-05: qty 28

## 2021-05-05 MED ORDER — ATORVASTATIN CALCIUM 20 MG PO TABS
20.0000 mg | ORAL_TABLET | Freq: Every day | ORAL | Status: DC
  Administered 2021-05-05 – 2021-05-06 (×2): 20 mg via ORAL
  Filled 2021-05-05 (×2): qty 1

## 2021-05-05 MED ORDER — IPRATROPIUM-ALBUTEROL 0.5-2.5 (3) MG/3ML IN SOLN: 3.0000 mL | Freq: Four times a day (QID) | RESPIRATORY_TRACT | Status: DC | PRN

## 2021-05-05 MED ORDER — CLOPIDOGREL BISULFATE 75 MG PO TABS
75.0000 mg | ORAL_TABLET | Freq: Every day | ORAL | Status: DC
  Administered 2021-05-05 – 2021-05-07 (×3): 75 mg via ORAL
  Filled 2021-05-05 (×3): qty 1

## 2021-05-05 MED ORDER — LITHIUM CARBONATE 300 MG PO CAPS
300.0000 mg | ORAL_CAPSULE | Freq: Two times a day (BID) | ORAL | Status: DC
  Administered 2021-05-05 – 2021-05-07 (×4): 300 mg via ORAL
  Filled 2021-05-05 (×5): qty 1

## 2021-05-05 MED ORDER — OXCARBAZEPINE 300 MG PO TABS
300.0000 mg | ORAL_TABLET | Freq: Every day | ORAL | Status: DC
  Administered 2021-05-05 – 2021-05-06 (×2): 300 mg via ORAL
  Filled 2021-05-05 (×3): qty 1

## 2021-05-05 MED ORDER — ENOXAPARIN SODIUM 40 MG/0.4ML IJ SOSY
40.0000 mg | PREFILLED_SYRINGE | INTRAMUSCULAR | Status: DC
  Administered 2021-05-05 – 2021-05-06 (×2): 40 mg via SUBCUTANEOUS
  Filled 2021-05-05: qty 0.4

## 2021-05-05 MED ORDER — IOHEXOL 350 MG/ML SOLN
75.0000 mL | Freq: Once | INTRAVENOUS | Status: AC | PRN
  Administered 2021-05-05: 75 mL via INTRAVENOUS

## 2021-05-05 MED ORDER — SODIUM CHLORIDE 0.9 % IV SOLN: INTRAVENOUS | Status: DC

## 2021-05-05 MED ORDER — AZITHROMYCIN 500 MG IV SOLR
500.0000 mg | INTRAVENOUS | Status: DC
  Administered 2021-05-06: 500 mg via INTRAVENOUS
  Filled 2021-05-05: qty 500

## 2021-05-05 NOTE — Progress Notes (Signed)
   05/05/21 1245  Clinical Encounter Type  Visited With Patient  Visit Type Initial;Spiritual support;Social support  Referral From Nurse  Consult/Referral To [place] (Comment)  Chaplain Burris followed up on order to provide assistance with an advanced directive. Chaplain met Ms. Hunton who was visibly restless and uncomfortable in bed. Pt complained of pain. Although showing some confusion Pt did engage chaplain and shared some aspects of her personal history. Chaplain Burris provided a compassionate, non-anxious presence. Chaplain also offered support, hospitality, normalization of emotions, and silent prayer. Visit initiated due to request for advanced directive but chaplain's assessment at this time is that Pt is too confused to authorize decision making.

## 2021-05-05 NOTE — ED Notes (Signed)
Pt taken to CT.

## 2021-05-05 NOTE — ED Notes (Signed)
Transport requested to take patient upstairs to room.

## 2021-05-05 NOTE — ED Notes (Signed)
Pt asking for tylenol for her pain, RN is already aware.
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
## 2021-05-05 NOTE — ED Provider Notes (Signed)
Emergency Medicine Provider Triage Evaluation Note  Cynthia [name] , a 65 y.o. female  was evaluated in triage.  Pt complains of R sided abdominal pain that started this evening. Pain is constant, sharp. No vomiting or diarrhea  Review of Systems  Positive: Abd pain Negative: Nausea or vomiting  Physical Exam  BP (!) 146/78 (BP Location: Left Arm)   Pulse 79   Temp (!) 100.4 F (38 C) (Oral)   Resp 19   Ht '5\' 5"'$  (1.651 m)   Wt 56.7 kg   SpO2 96%   BMI 20.80 kg/m  Gen:   Awake, no distress   Resp:  Normal effort  MSK:   Moves extremities without difficulty  Abd:  Diffusely tender to palpation worse on the R quadrants with no rebound or guarding   Medical Decision Making  Medically screening exam initiated at 1:54 AM.  Appropriate orders placed.  Cynthia [name] was informed that the remainder of the evaluation will be completed by another provider, this initial triage assessment does not replace that evaluation, and the importance of remaining in the ED until their evaluation is complete.  79F w/ h/o cirrhosis of the liver who presents for several hours of R sided abd pain. Patient has a fever 100.45F, remaining of vitals WNL. Labs showing WBC 20K, normal LFT, lipase, and chemistry panel.  Ddx appendicitis, kidney stone, pyelonephritis, gallbladder pathology, diverticulitis, UTI.  CT and UA ordered. Tylenol ordered for the fever.   Alfred Levins, [place], MD 05/05/21 0157

## 2021-05-05 NOTE — ED Notes (Signed)
Patient alert and oriented when asked orientation questions, however, patient observed attempting to get out of bed. Patient states, "My back is hurting so bad. What are they trying to do to me?" Consoled patient and assisted her back into bed. Bed alarm on for patient safety.
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
## 2021-05-05 NOTE — ED Notes (Signed)
Requested pain medication for patient per patient request. MD aware.

## 2021-05-05 NOTE — ED Notes (Signed)
Assisted patient with toileting. Patient ambulated with steady gait.

## 2021-05-05 NOTE — Progress Notes (Signed)
  Chaplain On-Call responded to Order Requisition for Advance Directives information for the patient.  Chaplain attempted to visit with the patient but was unable to understand the patient.  Chaplain consulted with RN Danton Sewer who reported the patient's current confusion, and that the patient's husband had made the AD request.  Chaplain will refer the request to the afternoon Chaplain for follow up.  Chaplain Pollyann Samples M.Div., [facility]

## 2021-05-06 DIAGNOSIS — F319 Bipolar disorder, unspecified: Secondary | ICD-10-CM

## 2021-05-06 DIAGNOSIS — I10 Essential (primary) hypertension: Secondary | ICD-10-CM

## 2021-05-06 DIAGNOSIS — R6510 Systemic inflammatory response syndrome (SIRS) of non-infectious origin without acute organ dysfunction: Secondary | ICD-10-CM

## 2021-05-06 LAB — COMPREHENSIVE METABOLIC PANEL
ALT: 9 U/L (ref 0–44)
AST: 13 U/L — ABNORMAL LOW (ref 15–41)
Albumin: 3.4 g/dL — ABNORMAL LOW (ref 3.5–5.0)
Alkaline Phosphatase: 46 U/L (ref 38–126)
Anion gap: 10 (ref 5–15)
BUN: 24 mg/dL — ABNORMAL HIGH (ref 8–23)
CO2: 20 mmol/L — ABNORMAL LOW (ref 22–32)
Calcium: 10.2 mg/dL (ref 8.9–10.3)
Chloride: 104 mmol/L (ref 98–111)
Creatinine, Ser: 1.29 mg/dL — ABNORMAL HIGH (ref 0.44–1.00)
GFR, Estimated: 46 mL/min — ABNORMAL LOW (ref >60)
Glucose, Bld: 120 mg/dL — ABNORMAL HIGH (ref 70–99)
Potassium: 3.9 mmol/L (ref 3.5–5.1)
Sodium: 134 mmol/L — ABNORMAL LOW (ref 135–145)
Total Bilirubin: 0.7 mg/dL (ref 0.3–1.2)
Total Protein: 7 g/dL (ref 6.5–8.1)

## 2021-05-06 LAB — CBC
HCT: 35.1 % — ABNORMAL LOW (ref 36.0–46.0)
Hemoglobin: 11.4 g/dL — ABNORMAL LOW (ref 12.0–15.0)
MCH: 29.9 pg (ref 26.0–34.0)
MCHC: 32.5 g/dL (ref 30.0–36.0)
MCV: 92.1 fL (ref 80.0–100.0)
Platelets: 165 10*3/uL (ref 150–400)
RBC: 3.81 MIL/uL — ABNORMAL LOW (ref 3.87–5.11)
RDW: 13.8 % (ref 11.5–15.5)
WBC: 18.5 10*3/uL — ABNORMAL HIGH (ref 4.0–10.5)
nRBC: 0 % (ref 0.0–0.2)

## 2021-05-06 LAB — URINE CULTURE

## 2021-05-06 LAB — STREP PNEUMONIAE URINARY ANTIGEN: Strep Pneumo Urinary Antigen: POSITIVE — AB

## 2021-05-06 MED ORDER — AZITHROMYCIN 500 MG PO TABS
500.0000 mg | ORAL_TABLET | Freq: Every day | ORAL | Status: AC
  Administered 2021-05-07: 09:00:00 500 mg via ORAL
  Filled 2021-05-06: qty 1

## 2021-05-06 NOTE — Progress Notes (Signed)
PROGRESS NOTE        PATIENT DETAILS Name: Cynthia [name] Age: 65 y.o. Sex: female Date of Birth: [birth year] Admit Date: 05/05/2021 Admitting Physician Clance Boll, MD [id number], Kristeen Miss, PA-C  Brief Narrative: Patient is a 65 y.o. female with history of bipolar disorder, sick sinus syndrome-s/p PPM implantation, COPD, ongoing tobacco use, HCV, seizure disorder, CAD-who presented with right-sided abdominal pain/nausea/vomiting-weakness-upon further evaluation she was found to have left lobar pneumonia.  Significant events: 8/10>> admit for abdominal pain/nausea/vomiting-found to have leukocytosis-imaging with left-sided pneumonia.  Significant studies: 8/10>> CT abdomen/pelvis: No acute process in the abdomen, nodular liver-?  Cirrhosis. 8/10>> CT chest: Left lower lobe consolidation  Antimicrobial therapy: Rocephin: 8/9>> Zithromax: 8/9>>  Microbiology data: 8/10>> COVID/influenza PCR: Negative 8/10>> urine streptococcal antigen: Positive 8/10>> blood culture: Negative 8/10>> urine culture: Multiple species 8/10>> procalcitonin: 2.52  Procedures : None  Consults: None  DVT Prophylaxis : enoxaparin (LOVENOX) injection 40 mg Start: 05/05/21 2200   Subjective: Appears to have some cognitive dysfunction at baseline-claims she has been forgetful-and does not really know why she is here.  But able to answer simple questions appropriately-denies any nausea vomiting.  Denies any abdominal pain today.   Assessment/Plan: SIRS due to community-acquired pneumonia+/- UTI: Improving-afebrile-leukocytosis downtrending-continue Rocephin/Zithromax-await culture data.  COPD: Not in exacerbation-unfortunately continues to use tobacco-continue bronchodilators.  History of sick sinus syndrome-s/p PPM implantation: Continue telemetry monitoring.  CAD: No anginal symptoms on Plavix  HTN: BP stable without the use of any antihypertensives-resume  amlodipine/losartan when able.  Seizure disorder: Continue Trileptal  Bipolar disorder: Stable-continue Seroquel/lithium  History of TBI-with short term memory issues/cognitive  History of HCV with possible imaging evidence of cirrhosis: Currently compensated-further work-up can be performed in the outpatient setting  Tobacco abuse: Counseled-claims she has cut down quite a bit.  Diet: Diet Order             Diet Heart Room service appropriate? Yes; Fluid consistency: Thin  Diet effective now                    Code Status: Full code   Family Communication: Spoke with Spouse-[phone] over phone on 8/11  Disposition Plan: Status is: Inpatient  Remains inpatient appropriate because:Inpatient level of care appropriate due to severity of illness  Dispo: The patient is from: Home              Anticipated d/c is to: Home              Patient currently is not medically stable to d/c.   Difficult to place patient No    Barriers to Discharge: PNA/UTI-on IV antibiotics-awaiting cultures-still with significant leukocytosis-not yet stable for discharge.  Antimicrobial agents: Anti-infectives (From admission, onward)    Start     Dose/Rate Route Frequency Ordered Stop   05/06/21 0600  cefTRIAXone (ROCEPHIN) 2 g in sodium chloride 0.9 % 100 mL IVPB        2 g 200 mL/hr over 30 Minutes Intravenous Every 24 hours 05/05/21 1320 05/10/21 0559   05/06/21 0400  azithromycin (ZITHROMAX) 500 mg in sodium chloride 0.9 % 250 mL IVPB        500 mg 250 mL/hr over 60 Minutes Intravenous Every 24 hours 05/05/21 1320 05/10/21 0359   05/05/21 0600  cefTRIAXone (ROCEPHIN) 1 g in sodium chloride 0.9 % 100 mL IVPB  1 g 200 mL/hr over 30 Minutes Intravenous  Once 05/05/21 0549 05/05/21 0659   05/05/21 0600  azithromycin (ZITHROMAX) tablet 500 mg        500 mg Oral  Once 05/05/21 0549 05/05/21 0557   05/05/21 0500  amoxicillin-clavulanate (AUGMENTIN) 875-125 MG per tablet 1  tablet  Status:  Discontinued        1 tablet Oral  Once 05/05/21 0453 05/05/21 0548   05/05/21 0500  doxycycline (VIBRA-TABS) tablet 100 mg  Status:  Discontinued        100 mg Oral  Once 05/05/21 0453 05/05/21 0548        Time spent: 35 minutes-Greater than 50% of this time was spent in counseling, explanation of diagnosis, planning of further management, and coordination of care.  MEDICATIONS: Scheduled Meds:  atorvastatin  20 mg Oral QHS   clopidogrel  75 mg Oral Daily   enoxaparin (LOVENOX) injection  40 mg Subcutaneous Q24H   fluticasone furoate-vilanterol  1 puff Inhalation Daily   And   umeclidinium bromide  1 puff Inhalation Daily   lithium carbonate  300 mg Oral BID WC   Oxcarbazepine  300 mg Oral QHS   QUEtiapine  100 mg Oral QHS   QUEtiapine  50 mg Oral Daily   Continuous Infusions:  sodium chloride 75 mL/hr at 05/05/21 1425   azithromycin 500 mg (05/06/21 0401)   cefTRIAXone (ROCEPHIN)  IV 2 g (05/06/21 0635)   PRN Meds:.acetaminophen, hydrOXYzine, ipratropium-albuterol, nitroGLYCERIN   PHYSICAL EXAM: Vital signs: Vitals:   05/05/21 2049 05/06/21 0041 05/06/21 0414 05/06/21 0759  BP: 113/85 109/60 (!) 119/54 (!) 118/58  Pulse: 72 71 67 63  Resp: '18 17 18 '$ (!) 22  Temp: 98.8 F (37.1 C) 98.7 F (37.1 C) 99.3 F (37.4 C) 98.9 F (37.2 C)  TempSrc: Oral Oral  Oral  SpO2: 98% 98% 97% 96%  Weight:      Height:       Filed Weights   05/04/21 2148  Weight: 56.7 kg   Body mass index is 20.8 kg/m.   Gen Exam:Alert awake-not in any distress HEENT:atraumatic, normocephalic Chest: B/L clear to auscultation anteriorly CVS:S1S2 regular Abdomen:soft non tender, non distended Extremities:no edema Neurology: Non focal Skin: no rash  I have personally reviewed following labs and imaging studies  LABORATORY DATA: CBC: Recent Labs  Lab 05/04/21 2154 05/05/21 1329 05/06/21 0544  WBC 19.8*  19.9*  20.3* 19.4* 18.5*  NEUTROABS [id number]*  DUPLICATE  REQUEST [id number]*  --   HGB 13.5  13.4  13.4 12.5 11.4*  HCT 41.4  41.0  41.4 38.2 35.1*  MCV 95.8  95.8  95.0 94.3 92.1  PLT 254  253  243 175 [id number]    Basic Metabolic Panel: Recent Labs  Lab 05/04/21 2154 05/05/21 1329 05/06/21 0544  NA 135 134* 134*  K 4.5 4.1 3.9  CL 106 106 104  CO2 21* 21* 20*  GLUCOSE 147* 152* 120*  BUN 13 21 24*  CREATININE 1.19* 1.30* 1.29*  CALCIUM 9.7 9.7 10.2    GFR: Estimated Creatinine Clearance: 38.4 mL/min (A) (by C-G formula based on SCr of 1.29 mg/dL (H)).  Liver Function Tests: Recent Labs  Lab 05/04/21 2154 05/05/21 1329 05/06/21 0544  AST 20 23 13*  ALT '13 12 9  '$ ALKPHOS 78 45 46  BILITOT 0.8 0.9 0.7  PROT 8.3* 7.2 7.0  ALBUMIN 4.9 3.8 3.4*   Recent Labs  Lab 05/04/21 2154  LIPASE 33   No  results for input(s): AMMONIA in the last 168 hours.  Coagulation Profile: No results for input(s): INR, PROTIME in the last 168 hours.  Cardiac Enzymes: No results for input(s): CKTOTAL, CKMB, CKMBINDEX, TROPONINI in the last 168 hours.  BNP (last 3 results) No results for input(s): PROBNP in the last 8760 hours.  Lipid Profile: No results for input(s): CHOL, HDL, LDLCALC, TRIG, CHOLHDL, LDLDIRECT in the last 72 hours.  Thyroid Function Tests: Recent Labs    05/05/21 1329  TSH 2.020    Anemia Panel: No results for input(s): VITAMINB12, FOLATE, FERRITIN, TIBC, IRON, RETICCTPCT in the last 72 hours.  Urine analysis:    Component Value Date/Time   COLORURINE YELLOW (A) 05/05/2021 0228   APPEARANCEUR CLOUDY (A) 05/05/2021 0228   APPEARANCEUR Cloudy (A) 02/08/2016 1546   LABSPEC 1.013 05/05/2021 0228   PHURINE 6.0 05/05/2021 0228   GLUCOSEU NEGATIVE 05/05/2021 0228   HGBUR MODERATE (A) 05/05/2021 0228   BILIRUBINUR NEGATIVE 05/05/2021 0228   BILIRUBINUR neg 03/04/2021 1031   BILIRUBINUR Negative 02/08/2016 1546   KETONESUR NEGATIVE 05/05/2021 0228   PROTEINUR NEGATIVE 05/05/2021 0228   UROBILINOGEN 0.2 03/04/2021  1031   NITRITE NEGATIVE 05/05/2021 0228   LEUKOCYTESUR LARGE (A) 05/05/2021 0228    Sepsis Labs: Lactic Acid, Venous    Component Value Date/Time   LATICACIDVEN 3.0 (HH) 05/05/2021 1623    MICROBIOLOGY: Recent Results (from the past 240 hour(s))  Urine Culture     Status: Abnormal   Collection Time: 05/05/21  2:28 AM   Specimen: Urine, Clean Catch  Result Value Ref Range Status   Specimen Description   Final    URINE, CLEAN CATCH Performed at [facility], [address], [place], [place] [address]    Special Requests   Final    NONE Performed at [facility], [address] [place], [place] [address]    Culture MULTIPLE SPECIES PRESENT, SUGGEST RECOLLECTION (A)  Final   Report Status 05/06/2021 FINAL  Final  Blood culture (single)     Status: None (Preliminary result)   Collection Time: 05/05/21  6:19 AM   Specimen: BLOOD  Result Value Ref Range Status   Specimen Description BLOOD RIGHT FA  Final   Special Requests   Final    BOTTLES DRAWN AEROBIC AND ANAEROBIC Blood Culture adequate volume   Culture   Final    NO GROWTH 1 DAY Performed at [facility], [address], [place],  [address]    Report Status PENDING  Incomplete  Resp Panel by RT-PCR (Flu A&B, Covid) Nasopharyngeal Swab     Status: None   Collection Time: 05/05/21  6:19 AM   Specimen: Nasopharyngeal Swab; Nasopharyngeal(NP) swabs in vial transport medium  Result Value Ref Range Status   SARS Coronavirus 2 by RT PCR NEGATIVE NEGATIVE Final    Comment: (NOTE) SARS-CoV-2 target nucleic acids are NOT DETECTED.  The SARS-CoV-2 RNA is generally detectable in upper respiratory specimens during the acute phase of infection. The lowest concentration of SARS-CoV-2 viral copies this assay can detect is 138 copies/mL. A negative result does not preclude SARS-Cov-2 infection and should not be used as the sole basis for treatment or other patient management decisions. A  negative result may occur with  improper specimen collection/handling, submission of specimen other than nasopharyngeal swab, presence of viral mutation(s) within the areas targeted by this assay, and inadequate number of viral copies(<138 copies/mL). A negative result must be combined with clinical observations, patient history, and epidemiological  information. The expected result is Negative.  Fact Sheet for Patients:  EntrepreneurPulse.com.au  Fact Sheet for Healthcare Providers:  IncredibleEmployment.be  This test is no t yet approved or cleared by the [place] FDA and  has been authorized for detection and/or diagnosis of SARS-CoV-2 by FDA under an Emergency Use Authorization (EUA). This EUA will remain  in effect (meaning this test can be used) for the duration of the COVID-19 declaration under Section 564(b)(1) of the Act, 21 U.S.C.section 360bbb-3(b)(1), unless the authorization is terminated  or revoked sooner.       Influenza A by PCR NEGATIVE NEGATIVE Final   Influenza B by PCR NEGATIVE NEGATIVE Final    Comment: (NOTE) The Xpert Xpress SARS-CoV-2/FLU/RSV plus assay is intended as an aid in the diagnosis of influenza from Nasopharyngeal swab specimens and should not be used as a sole basis for treatment. Nasal washings and aspirates are unacceptable for Xpert Xpress SARS-CoV-2/FLU/RSV testing.  Fact Sheet for Patients: EntrepreneurPulse.com.au  Fact Sheet for Healthcare Providers: IncredibleEmployment.be  This test is not yet approved or cleared by the [place] FDA and has been authorized for detection and/or diagnosis of SARS-CoV-2 by FDA under an Emergency Use Authorization (EUA). This EUA will remain in effect (meaning this test can be used) for the duration of the COVID-19 declaration under Section 564(b)(1) of the Act, 21 U.S.C. section 360bbb-3(b)(1), unless the authorization  is terminated or revoked.  Performed at [facility], [address] [place], [place] [address]     RADIOLOGY STUDIES/RESULTS: CT CHEST WO CONTRAST  Result Date: 05/05/2021 CLINICAL DATA:  Abnormal x-ray lung nodule EXAM: CT CHEST WITHOUT CONTRAST TECHNIQUE: Multidetector CT imaging of the chest was performed following the standard protocol without IV contrast. COMPARISON:  Chest x-ray and CT 05/05/2021, 12/03/2020 CT, CT 11/25/2019 FINDINGS: Cardiovascular: Limited evaluation without intravenous contrast. Left-sided cardiac pacing device. Moderate aortic atherosclerosis. No aneurysm. Coronary vascular calcification. Normal cardiac size. Trace pericardial effusion Mediastinum/Nodes: Midline trachea. No thyroid mass. Subcentimeter mediastinal lymph nodes slightly increased compared to prior from March. Esophagus within normal limits. Lungs/Pleura: Emphysema. Small left-sided pleural effusion. Dense left lower lobe consolidation concerning for a pneumonia. Stable ground-glass focus at the right apex. Upper Abdomen: Masslike enlargement of left adrenal. No acute abnormality Musculoskeletal: No chest wall mass or suspicious bone lesions identified. IMPRESSION: 1. Small left-sided pleural effusion with left lower lobe consolidation suspect for pneumonia. Imaging follow-up to resolution is recommended. 2. Emphysema Aortic Atherosclerosis (ICD10-I70.0) and Emphysema (ICD10-J43.9). Electronically Signed   By: Donavan Foil M.D.   On: 05/05/2021 22:55   CT ABDOMEN PELVIS W CONTRAST  Result Date: 05/05/2021 CLINICAL DATA:  65 year old female with acute abdominal pain for 1 hour. EXAM: CT ABDOMEN AND PELVIS WITH CONTRAST TECHNIQUE: Multidetector CT imaging of the abdomen and pelvis was performed using the standard protocol following bolus administration of intravenous contrast. CONTRAST:  22m OMNIPAQUE IOHEXOL 350 MG/ML SOLN COMPARISON:  CT Abdomen and Pelvis 03/20/2020. Chest CTs 12/03/2020 through  11/06/2015. FINDINGS: Lower chest: Recurrent consolidation in the left lower lobe. Medial and posterior basal segments affected with trace associated left pleural effusion. This pneumonia smaller than that on 03/20/2020. Stable cardiac size with cardiac pacemaker leads. No pericardial effusion. Right lung base remains clear. Hepatobiliary: Distended gallbladder similar to last year, dilated about 7 cm diameter and measuring close to 10 cm in length. But no pericholecystic inflammation. Nodular liver contour (series 2, image 30). No discrete liver lesion. No bile duct enlargement. Pancreas: Negative. Spleen: Negative. Adrenals/Urinary  Tract: Chronically abnormal left adrenal gland appears stable and size and configuration since 2017 - and previously characterized as hyperplasia versus adenoma. Right adrenal is normal. Bilateral renal vascular calcifications. Nonobstructed kidneys. Symmetric renal enhancement. Mild if any nephrolithiasis. Ureters are decompressed. Unremarkable bladder. Stomach/Bowel: Redundant but decompressed distal large bowel in the pelvis. Gas-filled transverse colon. Redundant right colon with some gas in retained stool. The cecum is on a lax mesentery located anteriorly in the midline. Normal appendix on coronal image 35 and no large bowel inflammation. Terminal ileum is decompressed and negative. No dilated small bowel. Negative stomach and duodenum. No free air. No free fluid. Vascular/Lymphatic: Extensive Aortoiliac calcified atherosclerosis. Bilateral common iliac and external iliac artery vascular stents. These in the other major arterial structures appear to remain patent. Portal venous system is patent. No lymphadenopathy. Reproductive: Negative. Other: No pelvic free fluid. Musculoskeletal: Chronic disc and endplate degeneration at the lumbosacral junction. Chronic right posterior 9th and 10th rib fractures. Chronic subchondral degeneration of the right lateral acetabulum. No acute  osseous abnormality identified. IMPRESSION: 1. Recurrent Left Lower Lobe Pneumonia. Trace left pleural effusion. 2. No acute or inflammatory process identified in the abdomen or pelvis. 3. Nodular liver contour raising the possibility of Cirrhosis. Dilated gallbladder is similar to the abdomen CT last year and without CT evidence of acute cholecystitis. Severe Aortic Atherosclerosis (ICD10-I70.0). Chronically stable left adrenal gland hyperplasia versus adenoma. Electronically Signed   By: Genevie Ann M.D.   On: 05/05/2021 04:25   DG Chest Portable 1 View  Result Date: 05/05/2021 CLINICAL DATA:  65 year old female status post pacemaker placement for sick sinus syndrome. Smoker. EXAM: PORTABLE CHEST 1 VIEW COMPARISON:  Chest CT 12/03/2020 and earlier. FINDINGS: Portable AP upright view at 0635 hours. Lower lung volumes. Linear and streaky opacity at the lung bases greater on the left. Left chest dual lead cardiac pacemaker in place. No pneumothorax or adverse features. Mediastinal contours are stable and within normal limits. Stable background pulmonary interstitial changes. No pulmonary edema. Visualized tracheal air column is within normal limits. Stable visualized osseous structures. IMPRESSION: 1. Left chest cardiac pacemaker placed with no adverse features. 2. Continued low lung volumes with bibasilar opacity most resembling atelectasis. Electronically Signed   By: Genevie Ann M.D.   On: 05/05/2021 06:59   US ABDOMEN LIMITED RUQ (LIVER/GB)  Result Date: 05/05/2021 CLINICAL DATA:  65 year old female with acute abdominal pain. Dilated gallbladder on CT today. History of hepatitis C and cirrhosis. EXAM: ULTRASOUND ABDOMEN LIMITED RIGHT UPPER QUADRANT COMPARISON:  CT Abdomen and Pelvis 0352 hours FINDINGS: Gallbladder: Dilated gallbladder, up to 11 cm in length. Shadowing echogenic gallstones individually measure up to 19 mm (image 18). Gallbladder wall thickness remains normal. No pericholecystic fluid. No  sonographic Murphy sign elicited. Common bile duct: Diameter: 5-6 mm, normal. Liver: Highly nodular liver contour. Background liver echogenicity within normal limits. Portal vein is patent on color Doppler imaging with normal direction of blood flow towards the liver. No discrete liver lesion. Other: Negative visible right kidney. IMPRESSION: 1. Dilated gallbladder with cholelithiasis but no strong evidence of acute cholecystitis. 2. No evidence of bile duct obstruction. 3. Nodular, Cirrhotic liver. No discrete liver lesion by ultrasound. Electronically Signed   By: Genevie Ann M.D.   On: 05/05/2021 06:41     LOS: 1 day   Oren Binet, MD  [facility]    To contact the attending provider between 7A-7P or the covering provider during after hours 7P-7A, please log into the web site  [email or web address] and access using universal [place] password for that web site. If you do not have the password, please call the hospital operator.  05/06/2021, 10:48 AM

## 2021-05-06 NOTE — Progress Notes (Signed)
Updated husband on patient's status. Husband at bedside, conversing with patient. Patient alert and oriented. Reminded to push button for assistance out of bed and for pain. Patient states that she understands this. Bed alarm on and mats on floor. Patient c/o pain to left abd described at cramp-like sharp pain. Next dose of tylenol to be given.

## 2021-05-07 DIAGNOSIS — R569 Unspecified convulsions: Secondary | ICD-10-CM

## 2021-05-07 LAB — LEGIONELLA PNEUMOPHILA SEROGP 1 UR AG: L. pneumophila Serogp 1 Ur Ag: NEGATIVE

## 2021-05-07 LAB — CBC
HCT: 34.1 % — ABNORMAL LOW (ref 36.0–46.0)
Hemoglobin: 11 g/dL — ABNORMAL LOW (ref 12.0–15.0)
MCH: 30.6 pg (ref 26.0–34.0)
MCHC: 32.3 g/dL (ref 30.0–36.0)
MCV: 94.7 fL (ref 80.0–100.0)
Platelets: 134 10*3/uL — ABNORMAL LOW (ref 150–400)
RBC: 3.6 MIL/uL — ABNORMAL LOW (ref 3.87–5.11)
RDW: 13.9 % (ref 11.5–15.5)
WBC: 13.8 10*3/uL — ABNORMAL HIGH (ref 4.0–10.5)
nRBC: 0 % (ref 0.0–0.2)

## 2021-05-07 MED ORDER — CEFUROXIME AXETIL 500 MG PO TABS: 500.0000 mg | ORAL_TABLET | Freq: Two times a day (BID) | ORAL | 0 refills | Status: AC

## 2021-05-07 NOTE — Discharge Summary (Signed)
PATIENT DETAILS Name: Cynthia [name] Age: 65 y.o. Sex: female Date of Birth: 12/05/1955 MRN: [id number]. Admitting Physician: Clance Boll, MD UYQ:[id number], Florina Ou  Admit Date: 05/05/2021 Discharge date: 05/07/2021  Recommendations for Outpatient Follow-up:  Follow up with PCP in 1-2 weeks Please obtain CMP/CBC in one week Please follow blood cultures until final. Please repeat imaging of her chest in 4 to 6 weeks to document resolution of pneumonia.  Admitted From:  Home  Disposition: Royal: No  Equipment/Devices: None  Discharge Condition: Stable  CODE STATUS: FULL CODE  Diet recommendation:  Diet Order             Diet - low sodium heart healthy           Diet Heart Room service appropriate? Yes; Fluid consistency: Thin  Diet effective now                    Brief Narrative: Patient is a 65 y.o. female with history of bipolar disorder, sick sinus syndrome-s/p PPM implantation, COPD, ongoing tobacco use, HCV, seizure disorder, CAD-who presented with right-sided abdominal pain/nausea/vomiting-weakness-upon further evaluation she was found to have left lobar pneumonia.   Significant events: 8/10>> admit for abdominal pain/nausea/vomiting-found to have leukocytosis-imaging with left-sided pneumonia.   Significant studies: 8/10>> CT abdomen/pelvis: No acute process in the abdomen, nodular liver-?  Cirrhosis. 8/10>> CT chest: Left lower lobe consolidation   Antimicrobial therapy: Rocephin: 8/9>> Zithromax: 8/9>>   Microbiology data: 8/10>> COVID/influenza PCR: Negative 8/10>> urine streptococcal antigen: Positive 8/10>> blood culture: Negative 8/10>> urine culture: Multiple species 8/10>> procalcitonin: 2.52   Procedures : None   Consults: None  Brief Hospital Course: SIRS due to community-acquired pneumonia+/- UTI: Significantly better-she looks much better than yesterday-she acknowledges significant clinical  improvement and is requesting discharge.  She has been ambulating in the room without any issues.  Culture data as above-plan is to transition to Ceftin for a few more days.     COPD: Not in exacerbation-unfortunately continues to use tobacco-continue bronchodilators.   History of sick sinus syndrome-s/p PPM implantation: Continue telemetry monitoring.   CAD: No anginal symptoms on Plavix   HTN: BP stable without the use of any antihypertensives-resume amlodipine/losartan on discharge   Seizure disorder: Continue Trileptal   Bipolar disorder: Stable-continue Seroquel/lithium   History of TBI-with short term memory issues/cognitive   History of HCV with possible imaging evidence of cirrhosis: Currently compensated-further work-up can be performed in the outpatient setting   Tobacco abuse: Counseled-claims she has cut down quite a bit.    Procedures None  Discharge Diagnoses:  Active Problems:   PNA (pneumonia)   Sepsis [facility])   Discharge Instructions:  Activity:  As tolerated   Discharge Instructions     Call MD for:  difficulty breathing, headache or visual disturbances   Complete by: As directed    Diet - low sodium heart healthy   Complete by: As directed    Increase activity slowly   Complete by: As directed       Allergies as of 05/07/2021       Reactions   Morphine Anaphylaxis   Cardiac arrest        Medication List     TAKE these medications    albuterol 108 (90 Base) MCG/ACT inhaler Commonly known as: VENTOLIN HFA INHALE 2 PUFFS INTO THE LUNGS EVERY 6 HOURS AS NEEDED FOR WHEEZING OR SHORTNESS OF BREATH   amLODipine 10 MG tablet Commonly known as:  NORVASC TAKE 1 TABLET BY MOUTH ONCE DAILY   aspirin EC 81 MG tablet Take 1 tablet (81 mg total) by mouth daily.   atorvastatin 20 MG tablet Commonly known as: LIPITOR TAKE 1 TABLET BY MOUTH AT BEDTIME   cefUROXime 500 MG tablet Commonly known as: CEFTIN Take 1 tablet (500 mg total) by mouth 2  (two) times daily with a meal for 3 days.   clopidogrel 75 MG tablet Commonly known as: PLAVIX TAKE 1 TABLET BY MOUTH ONCE DAILY   hydrOXYzine 25 MG tablet Commonly known as: ATARAX/VISTARIL Take 1-2 tablets (25-50 mg total) by mouth 3 (three) times daily as needed for itching.   ipratropium-albuterol 0.5-2.5 (3) MG/3ML Soln Commonly known as: DUONEB Take 3 mLs by nebulization every 6 (six) hours as needed (for SOB, cough, chest tightness).   lithium carbonate 300 MG capsule Take 1 capsule (300 mg total) by mouth 2 (two) times daily with a meal.   losartan 50 MG tablet Commonly known as: COZAAR TAKE 1 TABLET BY MOUTH ONCE DAILY   Misc. Devices Kit Manual wheelchair   Bilateral leg weakness Codes: R29.898  Impaired mobility and ADLs Codes: Z74.09, Z78.9 Generalized weakness Codes: R53.1 Abnormality of gait Codes: R26.9   nitroGLYCERIN 0.4 MG SL tablet Commonly known as: NITROSTAT Place 1 tablet (0.4 mg total) under the tongue every 5 (five) minutes as needed for chest pain. Maximum of 3 pills; call 911 at first sign of chest pain   Oxcarbazepine 300 MG tablet Commonly known as: TRILEPTAL TAKE 1 TABLET BY MOUTH AT BEDTIME   QUEtiapine 100 MG tablet Commonly known as: SEROQUEL TAKE 1/2 TABLET BY MOUTH ONCE EVERY MORNING AND 1 TAB AT BEDTIME   Trelegy Ellipta 100-62.5-25 MCG/INH Aepb Generic drug: Fluticasone-Umeclidin-Vilant Inhale 1 puff into the lungs daily.        Follow-up Information     Delsa Grana, PA-C. Schedule an appointment as soon as possible for a visit in 1 week(s).   Specialty: Family Medicine Why: Hospital follow up Contact information: [address] [place] [address] [phone]                Allergies  Allergen Reactions   Morphine Anaphylaxis    Cardiac arrest      Consultations:  None   Other Procedures/Studies: CT CHEST WO CONTRAST  Result Date: 05/05/2021 CLINICAL DATA:  Abnormal x-ray lung nodule  EXAM: CT CHEST WITHOUT CONTRAST TECHNIQUE: Multidetector CT imaging of the chest was performed following the standard protocol without IV contrast. COMPARISON:  Chest x-ray and CT 05/05/2021, 12/03/2020 CT, CT 11/25/2019 FINDINGS: Cardiovascular: Limited evaluation without intravenous contrast. Left-sided cardiac pacing device. Moderate aortic atherosclerosis. No aneurysm. Coronary vascular calcification. Normal cardiac size. Trace pericardial effusion Mediastinum/Nodes: Midline trachea. No thyroid mass. Subcentimeter mediastinal lymph nodes slightly increased compared to prior from March. Esophagus within normal limits. Lungs/Pleura: Emphysema. Small left-sided pleural effusion. Dense left lower lobe consolidation concerning for a pneumonia. Stable ground-glass focus at the right apex. Upper Abdomen: Masslike enlargement of left adrenal. No acute abnormality Musculoskeletal: No chest wall mass or suspicious bone lesions identified. IMPRESSION: 1. Small left-sided pleural effusion with left lower lobe consolidation suspect for pneumonia. Imaging follow-up to resolution is recommended. 2. Emphysema Aortic Atherosclerosis (ICD10-I70.0) and Emphysema (ICD10-J43.9). Electronically Signed   By: Donavan Foil M.D.   On: 05/05/2021 22:55   CT ABDOMEN PELVIS W CONTRAST  Result Date: 05/05/2021 CLINICAL DATA:  64 year old female with acute abdominal pain for 1 hour. EXAM: CT  ABDOMEN AND PELVIS WITH CONTRAST TECHNIQUE: Multidetector CT imaging of the abdomen and pelvis was performed using the standard protocol following bolus administration of intravenous contrast. CONTRAST:  25m OMNIPAQUE IOHEXOL 350 MG/ML SOLN COMPARISON:  CT Abdomen and Pelvis 03/20/2020. Chest CTs 12/03/2020 through 11/06/2015. FINDINGS: Lower chest: Recurrent consolidation in the left lower lobe. Medial and posterior basal segments affected with trace associated left pleural effusion. This pneumonia smaller than that on 03/20/2020. Stable cardiac  size with cardiac pacemaker leads. No pericardial effusion. Right lung base remains clear. Hepatobiliary: Distended gallbladder similar to last year, dilated about 7 cm diameter and measuring close to 10 cm in length. But no pericholecystic inflammation. Nodular liver contour (series 2, image 30). No discrete liver lesion. No bile duct enlargement. Pancreas: Negative. Spleen: Negative. Adrenals/Urinary Tract: Chronically abnormal left adrenal gland appears stable and size and configuration since 2017 - and previously characterized as hyperplasia versus adenoma. Right adrenal is normal. Bilateral renal vascular calcifications. Nonobstructed kidneys. Symmetric renal enhancement. Mild if any nephrolithiasis. Ureters are decompressed. Unremarkable bladder. Stomach/Bowel: Redundant but decompressed distal large bowel in the pelvis. Gas-filled transverse colon. Redundant right colon with some gas in retained stool. The cecum is on a lax mesentery located anteriorly in the midline. Normal appendix on coronal image 35 and no large bowel inflammation. Terminal ileum is decompressed and negative. No dilated small bowel. Negative stomach and duodenum. No free air. No free fluid. Vascular/Lymphatic: Extensive Aortoiliac calcified atherosclerosis. Bilateral common iliac and external iliac artery vascular stents. These in the other major arterial structures appear to remain patent. Portal venous system is patent. No lymphadenopathy. Reproductive: Negative. Other: No pelvic free fluid. Musculoskeletal: Chronic disc and endplate degeneration at the lumbosacral junction. Chronic right posterior 9th and 10th rib fractures. Chronic subchondral degeneration of the right lateral acetabulum. No acute osseous abnormality identified. IMPRESSION: 1. Recurrent Left Lower Lobe Pneumonia. Trace left pleural effusion. 2. No acute or inflammatory process identified in the abdomen or pelvis. 3. Nodular liver contour raising the possibility of  Cirrhosis. Dilated gallbladder is similar to the abdomen CT last year and without CT evidence of acute cholecystitis. Severe Aortic Atherosclerosis (ICD10-I70.0). Chronically stable left adrenal gland hyperplasia versus adenoma. Electronically Signed   By: HGenevie AnnM.D.   On: 05/05/2021 04:25   DG Chest Portable 1 View  Result Date: 05/05/2021 CLINICAL DATA:  65year old female status post pacemaker placement for sick sinus syndrome. Smoker. EXAM: PORTABLE CHEST 1 VIEW COMPARISON:  Chest CT 12/03/2020 and earlier. FINDINGS: Portable AP upright view at 0635 hours. Lower lung volumes. Linear and streaky opacity at the lung bases greater on the left. Left chest dual lead cardiac pacemaker in place. No pneumothorax or adverse features. Mediastinal contours are stable and within normal limits. Stable background pulmonary interstitial changes. No pulmonary edema. Visualized tracheal air column is within normal limits. Stable visualized osseous structures. IMPRESSION: 1. Left chest cardiac pacemaker placed with no adverse features. 2. Continued low lung volumes with bibasilar opacity most resembling atelectasis. Electronically Signed   By: HGenevie AnnM.D.   On: 05/05/2021 06:59   [place]ABDOMEN LIMITED RUQ (LIVER/GB)  Result Date: 05/05/2021 CLINICAL DATA:  65year old female with acute abdominal pain. Dilated gallbladder on CT today. History of hepatitis C and cirrhosis. EXAM: ULTRASOUND ABDOMEN LIMITED RIGHT UPPER QUADRANT COMPARISON:  CT Abdomen and Pelvis 0352 hours FINDINGS: Gallbladder: Dilated gallbladder, up to 11 cm in length. Shadowing echogenic gallstones individually measure up to 19 mm (image 18). Gallbladder wall thickness remains normal. No  pericholecystic fluid. No sonographic Murphy sign elicited. Common bile duct: Diameter: 5-6 mm, normal. Liver: Highly nodular liver contour. Background liver echogenicity within normal limits. Portal vein is patent on color Doppler imaging with normal direction of blood  flow towards the liver. No discrete liver lesion. Other: Negative visible right kidney. IMPRESSION: 1. Dilated gallbladder with cholelithiasis but no strong evidence of acute cholecystitis. 2. No evidence of bile duct obstruction. 3. Nodular, Cirrhotic liver. No discrete liver lesion by ultrasound. Electronically Signed   By: Genevie Ann M.D.   On: 05/05/2021 06:41     TODAY-DAY OF DISCHARGE:  Subjective:   [name] today has no headache,no chest abdominal pain,no new weakness tingling or numbness, feels much better wants to go home today.   Objective:   Blood pressure 131/76, pulse 62, temperature 98.5 F (36.9 C), resp. rate 16, height '5\' 5"'  (1.651 m), weight 56.7 kg, SpO2 95 %.  Intake/Output Summary (Last 24 hours) at 05/07/2021 0935 Last data filed at 05/07/2021 0600 Gross per 24 hour  Intake 1073.59 ml  Output --  Net 1073.59 ml   Filed Weights   05/04/21 2148  Weight: 56.7 kg    Exam: Awake Alert, Oriented *3, No new F.N deficits, Normal affect [place].AT,PERRAL Supple Neck,No JVD, No cervical lymphadenopathy appriciated.  Symmetrical Chest wall movement, Good air movement bilaterally, CTAB RRR,No Gallops,Rubs or new Murmurs, No Parasternal Heave +ve B.Sounds, Abd Soft, Non tender, No organomegaly appriciated, No rebound -guarding or rigidity. No Cyanosis, Clubbing or edema, No new Rash or bruise   PERTINENT RADIOLOGIC STUDIES: CT CHEST WO CONTRAST  Result Date: 05/05/2021 CLINICAL DATA:  Abnormal x-ray lung nodule EXAM: CT CHEST WITHOUT CONTRAST TECHNIQUE: Multidetector CT imaging of the chest was performed following the standard protocol without IV contrast. COMPARISON:  Chest x-ray and CT 05/05/2021, 12/03/2020 CT, CT 11/25/2019 FINDINGS: Cardiovascular: Limited evaluation without intravenous contrast. Left-sided cardiac pacing device. Moderate aortic atherosclerosis. No aneurysm. Coronary vascular calcification. Normal cardiac size. Trace pericardial effusion  Mediastinum/Nodes: Midline trachea. No thyroid mass. Subcentimeter mediastinal lymph nodes slightly increased compared to prior from March. Esophagus within normal limits. Lungs/Pleura: Emphysema. Small left-sided pleural effusion. Dense left lower lobe consolidation concerning for a pneumonia. Stable ground-glass focus at the right apex. Upper Abdomen: Masslike enlargement of left adrenal. No acute abnormality Musculoskeletal: No chest wall mass or suspicious bone lesions identified. IMPRESSION: 1. Small left-sided pleural effusion with left lower lobe consolidation suspect for pneumonia. Imaging follow-up to resolution is recommended. 2. Emphysema Aortic Atherosclerosis (ICD10-I70.0) and Emphysema (ICD10-J43.9). Electronically Signed   By: Donavan Foil M.D.   On: 05/05/2021 22:55     PERTINENT LAB RESULTS: CBC: Recent Labs    05/06/21 0544 05/07/21 0625  WBC 18.5* 13.8*  HGB 11.4* 11.0*  HCT 35.1* 34.1*  PLT 165 134*   CMET CMP     Component Value Date/Time   NA 134 (L) 05/06/2021 0544   NA 136 09/17/2015 1543   K 3.9 05/06/2021 0544   CL 104 05/06/2021 0544   CO2 20 (L) 05/06/2021 0544   GLUCOSE 120 (H) 05/06/2021 0544   BUN 24 (H) 05/06/2021 0544   BUN 12 09/17/2015 1543   CREATININE 1.29 (H) 05/06/2021 0544   CREATININE 1.10 (H) 03/04/2021 1122   CALCIUM 10.2 05/06/2021 0544   PROT 7.0 05/06/2021 0544   PROT 7.7 06/25/2019 1403   ALBUMIN 3.4 (L) 05/06/2021 0544   ALBUMIN 4.5 06/25/2019 1403   AST 13 (L) 05/06/2021 0544   ALT 9 05/06/2021  0544   ALKPHOS 46 05/06/2021 0544   BILITOT 0.7 05/06/2021 0544   BILITOT 0.5 06/25/2019 1403   GFRNONAA 46 (L) 05/06/2021 0544   GFRNONAA 52 (L) 03/04/2021 1122   GFRAA 61 03/04/2021 1122    GFR Estimated Creatinine Clearance: 38.4 mL/min (A) (by C-G formula based on SCr of 1.29 mg/dL (H)). Recent Labs    05/04/21 2154  LIPASE 33   No results for input(s): CKTOTAL, CKMB, CKMBINDEX, TROPONINI in the last 72 hours. Invalid  input(s): POCBNP No results for input(s): DDIMER in the last 72 hours. No results for input(s): HGBA1C in the last 72 hours. No results for input(s): CHOL, HDL, LDLCALC, TRIG, CHOLHDL, LDLDIRECT in the last 72 hours. Recent Labs    05/05/21 1329  TSH 2.020   No results for input(s): VITAMINB12, FOLATE, FERRITIN, TIBC, IRON, RETICCTPCT in the last 72 hours. Coags: No results for input(s): INR in the last 72 hours.  Invalid input(s): PT Microbiology: Recent Results (from the past 240 hour(s))  Urine Culture     Status: Abnormal   Collection Time: 05/05/21  2:28 AM   Specimen: Urine, Clean Catch  Result Value Ref Range Status   Specimen Description   Final    URINE, CLEAN CATCH Performed at [facility], [address] [place], [place] [address]    Special Requests   Final    NONE Performed at [facility], [address] [place], [place] [address]    Culture MULTIPLE SPECIES PRESENT, SUGGEST RECOLLECTION (A)  Final   Report Status 05/06/2021 FINAL  Final  Blood culture (single)     Status: None (Preliminary result)   Collection Time: 05/05/21  6:19 AM   Specimen: BLOOD  Result Value Ref Range Status   Specimen Description BLOOD RIGHT FA  Final   Special Requests   Final    BOTTLES DRAWN AEROBIC AND ANAEROBIC Blood Culture adequate volume   Culture   Final    NO GROWTH 2 DAYS Performed at [facility], [address] [name], [place] [address]    Report Status PENDING  Incomplete  Resp Panel by RT-PCR (Flu A&B, Covid) Nasopharyngeal Swab     Status: None   Collection Time: 05/05/21  6:19 AM   Specimen: Nasopharyngeal Swab; Nasopharyngeal(NP) swabs in vial transport medium  Result Value Ref Range Status   SARS Coronavirus 2 by RT PCR NEGATIVE NEGATIVE Final    Comment: (NOTE) SARS-CoV-2 target nucleic acids are NOT DETECTED.  The SARS-CoV-2 RNA is generally detectable in upper respiratory specimens during the acute phase of infection.  The lowest concentration of SARS-CoV-2 viral copies this assay can detect is 138 copies/mL. A negative result does not preclude SARS-Cov-2 infection and should not be used as the sole basis for treatment or other patient management decisions. A negative result may occur with  improper specimen collection/handling, submission of specimen other than nasopharyngeal swab, presence of viral mutation(s) within the areas targeted by this assay, and inadequate number of viral copies(<138 copies/mL). A negative result must be combined with clinical observations, patient history, and epidemiological information. The expected result is Negative.  Fact Sheet for Patients:  EntrepreneurPulse.com.au  Fact Sheet for Healthcare Providers:  IncredibleEmployment.be  This test is no t yet approved or cleared by the [place] FDA and  has been authorized for detection and/or diagnosis of SARS-CoV-2 by FDA under an Emergency Use Authorization (EUA). This EUA will remain  in effect (meaning this test can be used) for the  duration of the COVID-19 declaration under Section 564(b)(1) of the Act, 21 U.S.C.section 360bbb-3(b)(1), unless the authorization is terminated  or revoked sooner.       Influenza A by PCR NEGATIVE NEGATIVE Final   Influenza B by PCR NEGATIVE NEGATIVE Final    Comment: (NOTE) The Xpert Xpress SARS-CoV-2/FLU/RSV plus assay is intended as an aid in the diagnosis of influenza from Nasopharyngeal swab specimens and should not be used as a sole basis for treatment. Nasal washings and aspirates are unacceptable for Xpert Xpress SARS-CoV-2/FLU/RSV testing.  Fact Sheet for Patients: EntrepreneurPulse.com.au  Fact Sheet for Healthcare Providers: IncredibleEmployment.be  This test is not yet approved or cleared by the [place] FDA and has been authorized for detection and/or diagnosis of SARS-CoV-2 by FDA  under an Emergency Use Authorization (EUA). This EUA will remain in effect (meaning this test can be used) for the duration of the COVID-19 declaration under Section 564(b)(1) of the Act, 21 U.S.C. section 360bbb-3(b)(1), unless the authorization is terminated or revoked.  Performed at [facility], [address] [place],  [address]     FURTHER DISCHARGE INSTRUCTIONS:  Get Medicines reviewed and adjusted: Please take all your medications with you for your next visit with your Primary MD  Laboratory/radiological data: Please request your Primary MD to go over all hospital tests and procedure/radiological results at the follow up, please ask your Primary MD to get all Hospital records sent to his/her office.  In some cases, they will be blood work, cultures and biopsy results pending at the time of your discharge. Please request that your primary care M.D. goes through all the records of your hospital data and follows up on these results.  Also Note the following: If you experience worsening of your admission symptoms, develop shortness of breath, life threatening emergency, suicidal or homicidal thoughts you must seek medical attention immediately by calling 911 or calling your MD immediately  if symptoms less severe.  You must read complete instructions/literature along with all the possible adverse reactions/side effects for all the Medicines you take and that have been prescribed to you. Take any new Medicines after you have completely understood and accpet all the possible adverse reactions/side effects.   Do not drive when taking Pain medications or sleeping medications (Benzodaizepines)  Do not take more than prescribed Pain, Sleep and Anxiety Medications. It is not advisable to combine anxiety,sleep and pain medications without talking with your primary care practitioner  Special Instructions: If you have smoked or chewed Tobacco  in the last 2 yrs please stop  smoking, stop any regular Alcohol  and or any Recreational drug use.  Wear Seat belts while driving.  Please note: You were cared for by a hospitalist during your hospital stay. Once you are discharged, your primary care physician will handle any further medical issues. Please note that NO REFILLS for any discharge medications will be authorized once you are discharged, as it is imperative that you return to your primary care physician (or establish a relationship with a primary care physician if you do not have one) for your post hospital discharge needs so that they can reassess your need for medications and monitor your lab values.  Total Time spent coordinating discharge including counseling, education and face to face time equals 35 minutes.  SignedOren Binet 05/07/2021 9:35 AM

## 2021-05-07 NOTE — Care Management Important Message (Signed)
Important Message  Patient Details  Name: Natascha Kielty MRN: [id number] Date of Birth: 1956-01-29   Medicare Important Message Given:  Yes     [name] 05/07/2021, 10:43 AM

## 2021-05-07 NOTE — Plan of Care (Signed)
  Problem: Clinical Measurements: Goal: Ability to maintain clinical measurements within normal limits will improve Outcome: Progressing   Problem: Clinical Measurements: Goal: Will remain free from infection Outcome: Progressing   Problem: Clinical Measurements: Goal: Diagnostic test results will improve Outcome: Progressing   Problem: Clinical Measurements: Goal: Respiratory complications will improve Outcome: Progressing   

## 2021-05-07 NOTE — Discharge Instructions (Signed)
Follow up with PCP in 1-2 weeks Please obtain CMP/CBC in one week Please follow blood cultures until final. Please repeat imaging of her chest in 4 to 6 weeks to document resolution of pneumonia.

## 2021-05-10 ENCOUNTER — Other Ambulatory Visit: Payer: Self-pay | Admitting: Family Medicine

## 2021-05-10 ENCOUNTER — Telehealth: Payer: Self-pay

## 2021-05-10 DIAGNOSIS — J189 Pneumonia, unspecified organism: Secondary | ICD-10-CM

## 2021-05-10 DIAGNOSIS — Z09 Encounter for follow-up examination after completed treatment for conditions other than malignant neoplasm: Secondary | ICD-10-CM

## 2021-05-10 DIAGNOSIS — R112 Nausea with vomiting, unspecified: Secondary | ICD-10-CM

## 2021-05-10 DIAGNOSIS — J90 Pleural effusion, not elsewhere classified: Secondary | ICD-10-CM

## 2021-05-10 LAB — CULTURE, BLOOD (SINGLE)
Culture: NO GROWTH
Special Requests: ADEQUATE

## 2021-05-10 NOTE — Progress Notes (Signed)
Hospital f/up labs and Xrays    ICD-10-CM   1. Nausea and vomiting, intractability of vomiting not specified, unspecified vomiting type  R11.2 CBC with Differential/Platelet    COMPLETE METABOLIC PANEL WITH GFR    2. Pneumonia of left lower lobe due to infectious organism  J18.9 DG Chest 2 View    3. Pleural effusion, left  J90 DG Chest 2 View    4. Encounter for examination following treatment at hospital  Z09 CBC with Differential/Platelet    COMPLETE METABOLIC PANEL WITH GFR    DG Chest 2 View       Chemistry      Component Value Date/Time   NA 134 (L) 05/06/2021 0544   NA 136 09/17/2015 1543   K 3.9 05/06/2021 0544   CL 104 05/06/2021 0544   CO2 20 (L) 05/06/2021 0544   BUN 24 (H) 05/06/2021 0544   BUN 12 09/17/2015 1543   CREATININE 1.29 (H) 05/06/2021 0544   CREATININE 1.10 (H) 03/04/2021 1122      Component Value Date/Time   CALCIUM 10.2 05/06/2021 0544   ALKPHOS 46 05/06/2021 0544   AST 13 (L) 05/06/2021 0544   ALT 9 05/06/2021 0544   BILITOT 0.7 05/06/2021 0544   BILITOT 0.5 06/25/2019 1403     CBC - H/H 11.0/34.1 WBC - 05/06/21 18.2 improved 05/07/21 to 13.8  Reviewed labs and imaging  Delsa Grana, PA-C

## 2021-05-10 NOTE — Telephone Encounter (Signed)
Transition Care Management Unsuccessful Follow-up Telephone Call  Date of discharge and from where:  05/07/2021-[facility]  Attempts:  1st Attempt  Reason for unsuccessful TCM follow-up call:  Unable to reach patient

## 2021-05-11 NOTE — Telephone Encounter (Signed)
Transition Care Management Unsuccessful Follow-up Telephone Call  Date of discharge and from where:  05/07/2021-[facility]  Attempts:  2nd Attempt  Reason for unsuccessful TCM follow-up call:  Unable to reach patient

## 2021-05-12 NOTE — Telephone Encounter (Signed)
Transition Care Management Unsuccessful Follow-up Telephone Call  Date of discharge and from where:  05/07/2021-[facility]  Attempts:  3rd Attempt  Reason for unsuccessful TCM follow-up call:  Unable to reach patient

## 2021-05-15 ENCOUNTER — Telehealth (INDEPENDENT_AMBULATORY_CARE_PROVIDER_SITE_OTHER): Payer: Self-pay | Admitting: Nurse Practitioner

## 2021-05-17 ENCOUNTER — Telehealth (INDEPENDENT_AMBULATORY_CARE_PROVIDER_SITE_OTHER): Payer: Medicare Other | Admitting: Family Medicine

## 2021-05-17 ENCOUNTER — Other Ambulatory Visit: Payer: Self-pay

## 2021-05-17 ENCOUNTER — Encounter: Payer: Self-pay | Admitting: Family Medicine

## 2021-05-17 DIAGNOSIS — Z20822 Contact with and (suspected) exposure to covid-19: Secondary | ICD-10-CM

## 2021-05-17 NOTE — Patient Instructions (Signed)
It was great to see you!  Our plans for today:  - See below for self-isolation guidelines. You may end your quarantine if your test is negative, or if positive once you are 10 days from your positive test.  - If your test is positive, we will be prescribing an oral antiviral to prevent severe complications from [place] - I recommend getting your 4th COVID vaccine booster once you are healed from your current infection. - Certainly, if you are having difficulties breathing or unable to keep down fluids, go to the Emergency Department.   Take care and seek immediate care sooner if you develop any concerns.   Dr. Ky Barban     Person Under Monitoring Name: Cynthia [name]  Location: [address] [place] [address]   Infection Prevention Recommendations for Individuals Confirmed to have, or Being Evaluated for, 2019 Novel Coronavirus (COVID-19) Infection Who Receive Care at Home  Individuals who are confirmed to have, or are being evaluated for, COVID-19 should follow the prevention steps below until a healthcare provider or local or state health department says they can return to normal activities.  Stay home except to get medical care You should restrict activities outside your home, except for getting medical care. Do not go to work, school, or public areas, and do not use public transportation or taxis.  Call ahead before visiting your doctor Before your medical appointment, call the healthcare provider and tell them that you have, or are being evaluated for, COVID-19 infection. This will help the healthcare provider's office take steps to keep other people from getting infected. Ask your healthcare provider to call the local or state health department.  Monitor your symptoms Seek prompt medical attention if your illness is worsening (e.g., difficulty breathing). Before going to your medical appointment, call the healthcare provider and tell them that you have, or are being  evaluated for, COVID-19 infection. Ask your healthcare provider to call the local or state health department.  Wear a facemask You should wear a facemask that covers your nose and mouth when you are in the same room with other people and when you visit a healthcare provider. People who live with or visit you should also wear a facemask while they are in the same room with you.  Separate yourself from other people in your home As much as possible, you should stay in a different room from other people in your home. Also, you should use a separate bathroom, if available.  Avoid sharing household items You should not share dishes, drinking glasses, cups, eating utensils, towels, bedding, or other items with other people in your home. After using these items, you should wash them thoroughly with soap and water.  Cover your coughs and sneezes Cover your mouth and nose with a tissue when you cough or sneeze, or you can cough or sneeze into your sleeve. Throw used tissues in a lined trash can, and immediately wash your hands with soap and water for at least 20 seconds or use an alcohol-based hand rub.  Wash your [facility] your hands often and thoroughly with soap and water for at least 20 seconds. You can use an alcohol-based hand sanitizer if soap and water are not available and if your hands are not visibly dirty. Avoid touching your eyes, nose, and mouth with unwashed hands.   Prevention Steps for Caregivers and Household Members of Individuals Confirmed to have, or Being Evaluated for, COVID-19 Infection Being Cared for in the Home  If you  live with, or provide care at home for, a person confirmed to have, or being evaluated for, COVID-19 infection please follow these guidelines to prevent infection:  Follow healthcare provider's instructions Make sure that you understand and can help the patient follow any healthcare provider instructions for all care.  Provide for the patient's  basic needs You should help the patient with basic needs in the home and provide support for getting groceries, prescriptions, and other personal needs.  Monitor the patient's symptoms If they are getting sicker, call his or her medical provider and tell them that the patient has, or is being evaluated for, COVID-19 infection. This will help the healthcare provider's office take steps to keep other people from getting infected. Ask the healthcare provider to call the local or state health department.  Limit the number of people who have contact with the patient If possible, have only one caregiver for the patient. Other household members should stay in another home or place of residence. If this is not possible, they should stay in another room, or be separated from the patient as much as possible. Use a separate bathroom, if available. Restrict visitors who do not have an essential need to be in the home.  Keep older adults, very young children, and other sick people away from the patient Keep older adults, very young children, and those who have compromised immune systems or chronic health conditions away from the patient. This includes people with chronic heart, lung, or kidney conditions, diabetes, and cancer.  Ensure good ventilation Make sure that shared spaces in the home have good air flow, such as from an air conditioner or an opened window, weather permitting.  Wash your hands often Wash your hands often and thoroughly with soap and water for at least 20 seconds. You can use an alcohol based hand sanitizer if soap and water are not available and if your hands are not visibly dirty. Avoid touching your eyes, nose, and mouth with unwashed hands. Use disposable paper towels to dry your hands. If not available, use dedicated cloth towels and replace them when they become wet.  Wear a facemask and gloves Wear a disposable facemask at all times in the room and gloves when you touch or  have contact with the patient's blood, body fluids, and/or secretions or excretions, such as sweat, saliva, sputum, nasal mucus, vomit, urine, or feces.  Ensure the mask fits over your nose and mouth tightly, and do not touch it during use. Throw out disposable facemasks and gloves after using them. Do not reuse. Wash your hands immediately after removing your facemask and gloves. If your personal clothing becomes contaminated, carefully remove clothing and launder. Wash your hands after handling contaminated clothing. Place all used disposable facemasks, gloves, and other waste in a lined container before disposing them with other household waste. Remove gloves and wash your hands immediately after handling these items.  Do not share dishes, glasses, or other household items with the patient Avoid sharing household items. You should not share dishes, drinking glasses, cups, eating utensils, towels, bedding, or other items with a patient who is confirmed to have, or being evaluated for, COVID-19 infection. After the person uses these items, you should wash them thoroughly with soap and water.  Wash laundry thoroughly Immediately remove and wash clothes or bedding that have blood, body fluids, and/or secretions or excretions, such as sweat, saliva, sputum, nasal mucus, vomit, urine, or feces, on them. Wear gloves when handling laundry from the patient.  Read and follow directions on labels of laundry or clothing items and detergent. In general, wash and dry with the warmest temperatures recommended on the label.  Clean all areas the individual has used often Clean all touchable surfaces, such as counters, tabletops, doorknobs, bathroom fixtures, toilets, phones, keyboards, tablets, and bedside tables, every day. Also, clean any surfaces that may have blood, body fluids, and/or secretions or excretions on them. Wear gloves when cleaning surfaces the patient has come in contact with. Use a diluted  bleach solution (e.g., dilute bleach with 1 part bleach and 10 parts water) or a household disinfectant with a label that says EPA-registered for coronaviruses. To make a bleach solution at home, add 1 tablespoon of bleach to 1 quart (4 cups) of water. For a larger supply, add  cup of bleach to 1 gallon (16 cups) of water. Read labels of cleaning products and follow recommendations provided on product labels. Labels contain instructions for safe and effective use of the cleaning product including precautions you should take when applying the product, such as wearing gloves or eye protection and making sure you have good ventilation during use of the product. Remove gloves and wash hands immediately after cleaning.  Monitor yourself for signs and symptoms of illness Caregivers and household members are considered close contacts, should monitor their health, and will be asked to limit movement outside of the home to the extent possible. Follow the monitoring steps for close contacts listed on the symptom monitoring form.   ? If you have additional questions, contact your local health department or call the epidemiologist on call at [phone] (available 24/7). ? This guidance is subject to change. For the most up-to-date guidance from [facility], please refer to their website: [email or web address]

## 2021-05-17 NOTE — Progress Notes (Signed)
Virtual Visit Note  I connected with Cynthia [name] on 05/17/21 at  1:20 PM EDT by phone and verified that I am speaking with the correct person using two identifiers.  Location: Patient: home Provider: [facility] Entirety of visit conducted with husband/caregiver.   I discussed the limitations of evaluation and management by telemedicine and the availability of in person appointments. The patient expressed understanding and agreed to proceed.  History of Present Illness:  Exposure to COVID - recent hospitalization for SIRS 2/2 CAP 8/10-8/12, COVID/flu negative 8/10. Blood cx NGTD. Urine cx contaminated. - husband with COVID, tested positive 8/17 with symptom onset 8/13. - not currently having symptoms. Much improved from previous pneumonia. - no fevers, abd pain, SOB, N/V, chest pain - wearing masks at home - unable to fully isolate from husband  Observations/Objective:  Entirety of visit conducted with husband/caregiver over the phone.  Assessment and Plan:  Exposure to COVID-19 Currently asymptomatic. Will test for COVID, would be a candidate for COVID treatment if positive. Not a candidate for post-exposure prophylaxis.  Reviewed self-quarantine guidelines and emergency precautions.     I discussed the assessment and treatment plan with the patient. The patient was provided an opportunity to ask questions and all were answered. The patient agreed with the plan and demonstrated an understanding of the instructions.   The patient was advised to call back or seek an in-person evaluation if the symptoms worsen or if the condition fails to improve as anticipated.  I provided 13 minutes of non-face-to-face time during this encounter.   Myles Gip, DO

## 2021-05-18 LAB — SARS-COV-2, NAA 2 DAY TAT

## 2021-05-18 LAB — NOVEL CORONAVIRUS, NAA: SARS-CoV-2, NAA: DETECTED — AB

## 2021-05-19 ENCOUNTER — Other Ambulatory Visit: Payer: Self-pay

## 2021-05-19 ENCOUNTER — Other Ambulatory Visit: Payer: Self-pay | Admitting: Family Medicine

## 2021-05-19 MED ORDER — MOLNUPIRAVIR EUA 200MG CAPSULE
4.0000 | ORAL_CAPSULE | Freq: Two times a day (BID) | ORAL | 0 refills | Status: AC
  Filled 2021-05-19: qty 40, 5d supply, fill #0

## 2021-05-19 NOTE — Telephone Encounter (Unsigned)
Copied from [place] [id number]. Topic: General - Other >> May 19, 2021 12:48 PM Pawlus, Brayton Layman A wrote: Reason for CRM: Pts husband requested to speak with Bonnita Nasuti due to the pt being positive for COVID,  caller also stated he will go today to pick up the molnupiravir EUA 200 mg CAPS.

## 2021-06-03 ENCOUNTER — Ambulatory Visit: Payer: Medicare Other | Admitting: Pulmonary Disease

## 2021-07-01 ENCOUNTER — Other Ambulatory Visit (INDEPENDENT_AMBULATORY_CARE_PROVIDER_SITE_OTHER): Payer: Self-pay | Admitting: Nurse Practitioner

## 2021-07-01 NOTE — Telephone Encounter (Signed)
Patient last seen 10/2019 and no show 01/2020 should we refill

## 2021-07-05 ENCOUNTER — Ambulatory Visit: Payer: Medicare Other | Admitting: Family Medicine

## 2021-07-08 ENCOUNTER — Encounter: Payer: Self-pay | Admitting: Unknown Physician Specialty

## 2021-07-08 ENCOUNTER — Other Ambulatory Visit: Payer: Self-pay

## 2021-07-08 ENCOUNTER — Ambulatory Visit
Admission: RE | Admit: 2021-07-08 | Discharge: 2021-07-08 | Disposition: A | Discharge status: Home / Self Care | Payer: Medicare Other | Attending: Unknown Physician Specialty | Admitting: Unknown Physician Specialty

## 2021-07-08 ENCOUNTER — Ambulatory Visit
Admission: RE | Admit: 2021-07-08 | Discharge: 2021-07-08 | Disposition: A | Discharge status: Home / Self Care | Payer: Medicare Other | Source: Ambulatory Visit | Attending: Unknown Physician Specialty | Admitting: Unknown Physician Specialty

## 2021-07-08 ENCOUNTER — Ambulatory Visit (INDEPENDENT_AMBULATORY_CARE_PROVIDER_SITE_OTHER): Payer: Medicare Other | Admitting: Unknown Physician Specialty

## 2021-07-08 VITALS — BP 122/60 | HR 95 | Temp 97.7°F | Resp 16 | Ht 65.0 in | Wt 132.7 lb

## 2021-07-08 DIAGNOSIS — J449 Chronic obstructive pulmonary disease, unspecified: Secondary | ICD-10-CM | POA: Diagnosis not present

## 2021-07-08 DIAGNOSIS — R413 Other amnesia: Secondary | ICD-10-CM | POA: Diagnosis not present

## 2021-07-08 DIAGNOSIS — Z23 Encounter for immunization: Secondary | ICD-10-CM

## 2021-07-08 DIAGNOSIS — E782 Mixed hyperlipidemia: Secondary | ICD-10-CM

## 2021-07-08 DIAGNOSIS — I10 Essential (primary) hypertension: Secondary | ICD-10-CM | POA: Diagnosis not present

## 2021-07-08 DIAGNOSIS — J189 Pneumonia, unspecified organism: Secondary | ICD-10-CM | POA: Insufficient documentation

## 2021-07-08 NOTE — Assessment & Plan Note (Signed)
Pt describes SOB despite Trilegy and recent hospitalization.  Following with pulmonary.  Stable

## 2021-07-08 NOTE — Assessment & Plan Note (Signed)
Stable, continue present medications.   
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
## 2021-07-09 LAB — COMPLETE METABOLIC PANEL WITH GFR
AG Ratio: 1.3 (calc) (ref 1.0–2.5)
ALT: 9 U/L (ref 6–29)
AST: 12 U/L (ref 10–35)
Albumin: 4.4 g/dL (ref 3.6–5.1)
Alkaline phosphatase (APISO): 75 U/L (ref 37–153)
BUN/Creatinine Ratio: 13 (calc) (ref 6–22)
BUN: 15 mg/dL (ref 7–25)
CO2: 25 mmol/L (ref 20–32)
Calcium: 10.2 mg/dL (ref 8.6–10.4)
Chloride: 107 mmol/L (ref 98–110)
Creat: 1.12 mg/dL — ABNORMAL HIGH (ref 0.50–1.05)
Globulin: 3.3 g/dL (calc) (ref 1.9–3.7)
Glucose, Bld: 81 mg/dL (ref 65–99)
Potassium: 5 mmol/L (ref 3.5–5.3)
Sodium: 138 mmol/L (ref 135–146)
Total Bilirubin: 0.3 mg/dL (ref 0.2–1.2)
Total Protein: 7.7 g/dL (ref 6.1–8.1)
eGFR: 54 mL/min/{1.73_m2} — ABNORMAL LOW (ref >=60)

## 2021-07-09 LAB — CBC WITH DIFFERENTIAL/PLATELET
Absolute Monocytes: 407 cells/uL (ref 200–950)
Basophils Absolute: 133 cells/uL (ref 0–200)
Basophils Relative: 1.6 %
Eosinophils Absolute: 257 cells/uL (ref 15–500)
Eosinophils Relative: 3.1 %
HCT: 41.8 % (ref 35.0–45.0)
Hemoglobin: 13.8 g/dL (ref 11.7–15.5)
Lymphs Abs: 2947 cells/uL (ref 850–3900)
MCH: 30.6 pg (ref 27.0–33.0)
MCHC: 33 g/dL (ref 32.0–36.0)
MCV: 92.7 fL (ref 80.0–100.0)
MPV: 10.7 fL (ref 7.5–12.5)
Monocytes Relative: 4.9 %
Neutro Abs: 4557 cells/uL (ref 1500–7800)
Neutrophils Relative %: 54.9 %
Platelets: 195 10*3/uL (ref 140–400)
RBC: 4.51 10*6/uL (ref 3.80–5.10)
RDW: 12.2 % (ref 11.0–15.0)
Total Lymphocyte: 35.5 %
WBC: 8.3 10*3/uL (ref 3.8–10.8)

## 2021-07-09 LAB — LIPID PANEL
Cholesterol: 126 mg/dL (ref <200)
HDL: 49 mg/dL — ABNORMAL LOW (ref >=50)
LDL Cholesterol (Calc): 48 mg/dL (calc)
Non-HDL Cholesterol (Calc): 77 mg/dL (calc) (ref <130)
Total CHOL/HDL Ratio: 2.6 (calc) (ref <5.0)
Triglycerides: 217 mg/dL — ABNORMAL HIGH (ref <150)

## 2021-07-12 ENCOUNTER — Other Ambulatory Visit (INDEPENDENT_AMBULATORY_CARE_PROVIDER_SITE_OTHER): Payer: Self-pay | Admitting: Nurse Practitioner

## 2021-08-14 ENCOUNTER — Other Ambulatory Visit (INDEPENDENT_AMBULATORY_CARE_PROVIDER_SITE_OTHER): Payer: Self-pay | Admitting: Nurse Practitioner

## 2021-09-03 ENCOUNTER — Ambulatory Visit: Payer: Medicare Other | Admitting: Family Medicine

## 2021-09-22 ENCOUNTER — Telehealth (INDEPENDENT_AMBULATORY_CARE_PROVIDER_SITE_OTHER): Payer: Self-pay | Admitting: Nurse Practitioner

## 2021-09-28 NOTE — Telephone Encounter (Signed)
Cynthia [name] came into the office asking about a bill, advised to contact billing. Cynthia [name] is also asking about the patients medication. Wondering why the medication hasnt been refilled, the pharmacy states that they tried faxing [place] several times. Patient is also needing a follow up, please advise on what pt needs.

## 2021-09-28 NOTE — Telephone Encounter (Signed)
Patient was last seen 11/13/19. Please advise if patient will need to be schedule with ultrasound with follow up to continue receiving Clopidogrel.

## 2021-10-19 ENCOUNTER — Encounter (INDEPENDENT_AMBULATORY_CARE_PROVIDER_SITE_OTHER): Payer: Medicare Other

## 2021-10-19 ENCOUNTER — Ambulatory Visit (INDEPENDENT_AMBULATORY_CARE_PROVIDER_SITE_OTHER): Payer: Medicare Other | Admitting: Nurse Practitioner

## 2021-10-19 ENCOUNTER — Other Ambulatory Visit (INDEPENDENT_AMBULATORY_CARE_PROVIDER_SITE_OTHER): Payer: Self-pay | Admitting: Nurse Practitioner

## 2021-10-19 DIAGNOSIS — I739 Peripheral vascular disease, unspecified: Secondary | ICD-10-CM

## 2021-11-04 ENCOUNTER — Other Ambulatory Visit: Payer: Self-pay | Admitting: Family Medicine

## 2021-11-04 DIAGNOSIS — E7849 Other hyperlipidemia: Secondary | ICD-10-CM

## 2021-11-04 DIAGNOSIS — R21 Rash and other nonspecific skin eruption: Secondary | ICD-10-CM

## 2021-11-04 DIAGNOSIS — F319 Bipolar disorder, unspecified: Secondary | ICD-10-CM

## 2021-11-04 DIAGNOSIS — I10 Essential (primary) hypertension: Secondary | ICD-10-CM

## 2021-11-04 MED ORDER — CLOPIDOGREL BISULFATE 75 MG PO TABS: 75.0000 mg | ORAL_TABLET | Freq: Every day | ORAL | 0 refills | Status: DC

## 2021-11-04 MED ORDER — HYDROXYZINE HCL 25 MG PO TABS: 25.0000 mg | ORAL_TABLET | Freq: Three times a day (TID) | ORAL | 0 refills | Status: DC | PRN

## 2021-11-04 MED ORDER — AMLODIPINE BESYLATE 10 MG PO TABS: 10.0000 mg | ORAL_TABLET | Freq: Every day | ORAL | 0 refills | Status: DC

## 2021-11-04 MED ORDER — ATORVASTATIN CALCIUM 20 MG PO TABS: 20.0000 mg | ORAL_TABLET | Freq: Every day | ORAL | 0 refills | Status: DC

## 2021-11-04 MED ORDER — OXCARBAZEPINE 300 MG PO TABS: 300.0000 mg | ORAL_TABLET | Freq: Every day | ORAL | 0 refills | Status: DC

## 2021-11-04 MED ORDER — LOSARTAN POTASSIUM 50 MG PO TABS: 50.0000 mg | ORAL_TABLET | Freq: Every day | ORAL | 0 refills | Status: DC

## 2021-11-04 NOTE — Telephone Encounter (Signed)
Requested Prescriptions  Pending Prescriptions Disp Refills   atorvastatin (LIPITOR) 20 MG tablet 90 tablet 0    Sig: Take 1 tablet (20 mg total) by mouth at bedtime.     Cardiovascular:  Antilipid - Statins Failed - 11/04/2021 12:00 PM      Failed - Lipid Panel in normal range within the last 12 months    Cholesterol, Total  Date Value Ref Range Status  09/17/2015 228 (H) 100 - 199 mg/dL Final   Cholesterol  Date Value Ref Range Status  07/08/2021 126 <200 mg/dL Final   LDL Cholesterol (Calc)  Date Value Ref Range Status  07/08/2021 48 mg/dL (calc) Final    Comment:    Reference range: <100 . Desirable range <100 mg/dL for primary prevention;   <70 mg/dL for patients with CHD or diabetic patients  with > or = 2 CHD risk factors. Marland Kitchen LDL-C is now calculated using the Martin-Hopkins  calculation, which is a validated novel method providing  better accuracy than the Friedewald equation in the  estimation of LDL-C.  Cresenciano Genre et al. Annamaria Helling. 2947;654(65): 2061-2068  (http://education.[facility].com/faq/FAQ164)    HDL  Date Value Ref Range Status  07/08/2021 49 (L) > OR = 50 mg/dL Final  09/17/2015 74 >39 mg/dL Final   Triglycerides  Date Value Ref Range Status  07/08/2021 217 (H) <150 mg/dL Final    Comment:    . If a non-fasting specimen was collected, consider repeat triglyceride testing on a fasting specimen if clinically indicated.  Yates Decamp et al. J. of Clin. Lipidol. 0354;6:568-127. Marland Kitchen          Passed - Patient is not pregnant      Passed - Valid encounter within last 12 months    Recent Outpatient Visits          3 months ago Need for influenza vaccination   [facility] Kathrine Haddock, NP   5 months ago Exposure to COVID-19 virus   [place], DO   8 months ago Welcome to [employer] preventive visit   [place], NP   8 months ago [place], NP   10 months ago Pulmonary nodule   [place], [place]      Future Appointments            In 5 days Teodora Medici, [facility], [place]   In 4 months  [facility], [facility]            losartan (COZAAR) 50 MG tablet 90 tablet 3    Sig: Take 1 tablet (50 mg total) by mouth daily.     Cardiovascular:  Angiotensin Receptor Blockers Failed - 11/04/2021 12:00 PM      Failed - Cr in normal range and within 180 days    Creat  Date Value Ref Range Status  07/08/2021 1.12 (H) 0.50 - 1.05 mg/dL Final   Creatinine, Urine  Date Value Ref Range Status  03/30/2017 176 20 - 320 mg/dL Final         Passed - K in normal range and within 180 days    Potassium  Date Value Ref Range Status  07/08/2021 5.0 3.5 - 5.3 mmol/L Final         Passed - Patient is not pregnant      Passed - Last BP in normal range  BP Readings from Last 1 Encounters:  07/08/21 122/60         Passed - Valid encounter within last 6 months    Recent Outpatient Visits          3 months ago Need for influenza vaccination   [facility] Kathrine Haddock, NP   5 months ago Exposure to COVID-19 virus   [facility] Myles Gip, DO   8 months ago Welcome to [employer] preventive visit   [place], NP   8 months ago [place], NP   10 months ago Pulmonary nodule   [place], [place]      Future Appointments            In 5 days Teodora Medici, [facility], [place]   In 4 months  [facility]            amLODipine (NORVASC) 10 MG tablet 90 tablet 3    Sig: Take 1 tablet (10 mg total) by mouth daily.     Cardiovascular: Calcium Channel Blockers 2 Passed - 11/04/2021 12:00 PM       Passed - Last BP in normal range    BP Readings from Last 1 Encounters:  07/08/21 122/60         Passed - Last Heart Rate in normal range    Pulse Readings from Last 1 Encounters:  07/08/21 95         Passed - Valid encounter within last 6 months    Recent Outpatient Visits          3 months ago Need for influenza vaccination   [facility] Kathrine Haddock, NP   5 months ago Exposure to COVID-19 virus   [facility] Myles Gip, DO   8 months ago Welcome to [employer] preventive visit   [place], NP   8 months ago [place], NP   10 months ago Pulmonary nodule   [place], [place]      Future Appointments            In 5 days Teodora Medici, [facility], [place]   In 4 months  [facility], [facility]            hydrOXYzine (ATARAX) 25 MG tablet 60 tablet 1    Sig: Take 1-2 tablets (25-50 mg total) by mouth 3 (three) times daily as needed for itching.     Ear, Nose, and Throat:  Antihistamines 2 Failed - 11/04/2021 12:00 PM      Failed - Cr in normal range and within 360 days    Creat  Date Value Ref Range Status  07/08/2021 1.12 (H) 0.50 - 1.05 mg/dL Final   Creatinine, Urine  Date Value Ref Range Status  03/30/2017 176 20 - 320 mg/dL Final         Passed - Valid encounter within last 12 months    Recent Outpatient Visits          3 months ago Need for influenza vaccination   [place], NP   5 months ago Exposure to COVID-19 virus   [facility] Myles Gip, DO   8  months ago Welcome to [employer] preventive visit   [place], NP   8 months ago [place], NP   10 months ago Pulmonary nodule   [facility] Carles Collet M, [place]      Future Appointments            In 5 days Teodora Medici, [facility], [place]   In 4 months  [facility], [facility]            QUEtiapine (SEROQUEL) 100 MG tablet 135 tablet 1    Sig: TAKE 1/2 TABLET BY MOUTH ONCE EVERY MORNING AND 1 TAB AT BEDTIME     Not Delegated - Psychiatry:  Antipsychotics - Second Generation (Atypical) - quetiapine Failed - 11/04/2021 12:00 PM      Failed - This refill cannot be delegated      Failed - Lipid Panel in normal range within the last 12 months    Cholesterol, Total  Date Value Ref Range Status  09/17/2015 228 (H) 100 - 199 mg/dL Final   Cholesterol  Date Value Ref Range Status  07/08/2021 126 <200 mg/dL Final   LDL Cholesterol (Calc)  Date Value Ref Range Status  07/08/2021 48 mg/dL (calc) Final    Comment:    Reference range: <100 . Desirable range <100 mg/dL for primary prevention;   <70 mg/dL for patients with CHD or diabetic patients  with > or = 2 CHD risk factors. Marland Kitchen LDL-C is now calculated using the Martin-Hopkins  calculation, which is a validated novel method providing  better accuracy than the Friedewald equation in the  estimation of LDL-C.  Cresenciano Genre et al. Annamaria Helling. 9024;097(35): 2061-2068  (http://education.[facility].com/faq/FAQ164)    HDL  Date Value Ref Range Status  07/08/2021 49 (L) > OR = 50 mg/dL Final  09/17/2015 74 >39 mg/dL Final   Triglycerides  Date Value Ref Range Status  07/08/2021 217 (H) <150 mg/dL Final    Comment:    . If a non-fasting specimen was collected, consider repeat triglyceride testing on a fasting specimen if clinically indicated.  Yates Decamp et al. J. of Clin. Lipidol. 3299;2:426-834. Marland Kitchen          Passed - TSH in normal range and within 360 days    TSH  Date Value Ref Range Status  05/05/2021 2.020 0.350 - 4.500 uIU/mL Final    Comment:    Performed by a 3rd Generation assay with a  functional sensitivity of <=0.01 uIU/mL. Performed at [facility], [address] [place], [place] [address]   03/04/2021 1.16 0.40 - 4.50 mIU/L Final         Passed - Last BP in normal range    BP Readings from Last 1 Encounters:  07/08/21 122/60         Passed - Last Heart Rate in normal range    Pulse Readings from Last 1 Encounters:  07/08/21 95         Passed - Valid encounter within last 6 months    Recent Outpatient Visits          3 months ago Need for influenza vaccination   [facility] Kathrine Haddock, NP   5 months ago Exposure to COVID-19 virus   [place], DO   8 months ago Welcome to [employer] preventive visit   [place],  [place], NP   8 months ago [facility] [place], [place], NP   10 months ago Pulmonary nodule   [place], [place]      Future Appointments            In 5 days Teodora Medici, DO [facility], [place]   In 4 months  [place] within normal limits and completed in the last 12 months    WBC  Date Value Ref Range Status  07/08/2021 8.3 3.8 - 10.8 Thousand/uL Final   RBC  Date Value Ref Range Status  07/08/2021 4.51 3.80 - 5.10 Million/uL Final   Hemoglobin  Date Value Ref Range Status  07/08/2021 13.8 11.7 - 15.5 g/dL Final  09/17/2015 16.1 (H) 11.1 - 15.9 g/dL Final   HCT  Date Value Ref Range Status  07/08/2021 41.8 35.0 - 45.0 % Final   Hematocrit  Date Value Ref Range Status  09/17/2015 47.1 (H) 34.0 - 46.6 % Final   MCHC  Date Value Ref Range Status  07/08/2021 33.0 32.0 - 36.0 g/dL Final   [facility]  Date Value Ref Range Status  07/08/2021 30.6 27.0 - 33.0 pg Final   MCV  Date Value Ref Range Status  07/08/2021 92.7 80.0 - 100.0 fL Final  09/17/2015 91 79 - 97 fL Final   No  results found for: PLTCOUNTKUC, LABPLAT, POCPLA RDW  Date Value Ref Range Status  07/08/2021 12.2 11.0 - 15.0 % Final  09/17/2015 13.7 12.3 - 15.4 % Final         Passed - CMP within normal limits and completed in the last 12 months    Albumin  Date Value Ref Range Status  05/06/2021 3.4 (L) 3.5 - 5.0 g/dL Final  06/25/2019 4.5 3.8 - 4.8 g/dL Final   Albumin ELP  Date Value Ref Range Status  03/30/2017 4.0 3.8 - 4.8 g/dL Final   Alkaline Phosphatase  Date Value Ref Range Status  05/06/2021 46 38 - 126 U/L Final   Alkaline phosphatase (APISO)  Date Value Ref Range Status  07/08/2021 75 37 - 153 U/L Final   ALT  Date Value Ref Range Status  07/08/2021 9 6 - 29 U/L Final   AST  Date Value Ref Range Status  07/08/2021 12 10 - 35 U/L Final   BUN  Date Value Ref Range Status  07/08/2021 15 7 - 25 mg/dL Final  09/17/2015 12 8 - 27 mg/dL Final   Calcium  Date Value Ref Range Status  07/08/2021 10.2 8.6 - 10.4 mg/dL Final   CO2  Date Value Ref Range Status  07/08/2021 25 20 - 32 mmol/L Final   Bicarbonate  Date Value Ref Range Status  04/01/2020 32.0 (H) 20.0 - 28.0 mmol/L Final   Creat  Date Value Ref Range Status  07/08/2021 1.12 (H) 0.50 - 1.05 mg/dL Final   Creatinine, Urine  Date Value Ref Range Status  03/30/2017 176 20 - 320 mg/dL Final   Glucose, Bld  Date Value Ref Range Status  07/08/2021 81 65 - 99 mg/dL Final    Comment:    .            Fasting reference interval .    Glucose-Capillary  Date Value Ref Range Status  04/07/2020 94 70 - 99 mg/dL Final    Comment:  Glucose reference range applies only to samples taken after fasting for at least 8 hours.   Potassium  Date Value Ref Range Status  07/08/2021 5.0 3.5 - 5.3 mmol/L Final   Sodium  Date Value Ref Range Status  07/08/2021 138 135 - 146 mmol/L Final  09/17/2015 136 134 - 144 mmol/L Final    Comment:                  **Please note reference interval change**   Total  Bilirubin  Date Value Ref Range Status  07/08/2021 0.3 0.2 - 1.2 mg/dL Final   Bilirubin Total  Date Value Ref Range Status  06/25/2019 0.5 0.0 - 1.2 mg/dL Final   Bilirubin, Direct  Date Value Ref Range Status  06/25/2019 0.10 0.00 - 0.40 mg/dL Final   Indirect Bilirubin  Date Value Ref Range Status  08/29/2017 NOT CALCULATED 0.3 - 0.9 mg/dL Final   Protein, ur  Date Value Ref Range Status  05/05/2021 NEGATIVE NEGATIVE mg/dL Final   Total Protein, Urine  Date Value Ref Range Status  03/30/2017 177 (H) 5 - 24 mg/dL Final   Total Protein  Date Value Ref Range Status  07/08/2021 7.7 6.1 - 8.1 g/dL Final  06/25/2019 7.7 6.0 - 8.5 g/dL Final   Total Protein, Serum Electrophoresis  Date Value Ref Range Status  03/30/2017 7.8 6.1 - 8.1 g/dL Final   GFR, Est African American  Date Value Ref Range Status  03/04/2021 61 > OR = 60 mL/min/1.30m Final   eGFR  Date Value Ref Range Status  07/08/2021 54 (L) > OR = 60 mL/min/1.717mFinal    Comment:    The eGFR is based on the CKD-EPI 2021 equation. To calculate  the new eGFR from a previous Creatinine or Cystatin C result, go to https://www.kidney.org/professionals/ kdoqi/gfr%5Fcalculator    GFR, Est Non African American  Date Value Ref Range Status  03/04/2021 52 (L) > OR = 60 mL/min/1.7377minal   GFR, Estimated  Date Value Ref Range Status  05/06/2021 46 (L) >60 mL/min Final    Comment:    (NOTE) Calculated using the CKD-EPI Creatinine Equation (2021)           lithium carbonate 300 MG capsule 60 capsule 0    Sig: Take 1 capsule (300 mg total) by mouth 2 (two) times daily with a meal.     Not Delegated - Psychiatry:  Antimanics/Bipolar Agents Failed - 11/04/2021 12:00 PM      Failed - This refill cannot be delegated      Failed - TSH in normal range and within 180 days    TSH  Date Value Ref Range Status  05/05/2021 2.020 0.350 - 4.500 uIU/mL Final    Comment:    Performed by a 3rd Generation assay with  a functional sensitivity of <=0.01 uIU/mL. [facility] [place] [id number]/05/2021 1.16 0.40 - 4.50 mIU/L Final         Failed - Lithium Level in normal range and within 180 days    Lithium Lvl  Date Value Ref Range Status  03/28/2020 0.69 0.60 - 1.20 mmol/L Final    Comment:    [facility] [place] [id number]      Failed - BMP within normal limits in the last 6 months    Glucose, Bld  Date Value Ref Range Status  07/08/2021 81 65 -  99 mg/dL Final    Comment:    .            Fasting reference interval .    Glucose-Capillary  Date Value Ref Range Status  04/07/2020 94 70 - 99 mg/dL Final    Comment:    Glucose reference range applies only to samples taken after fasting for at least 8 hours.   Calcium  Date Value Ref Range Status  07/08/2021 10.2 8.6 - 10.4 mg/dL Final   Sodium  Date Value Ref Range Status  07/08/2021 138 135 - 146 mmol/L Final  09/17/2015 136 134 - 144 mmol/L Final    Comment:                  **Please note reference interval change**   Potassium  Date Value Ref Range Status  07/08/2021 5.0 3.5 - 5.3 mmol/L Final   Chloride  Date Value Ref Range Status  07/08/2021 107 98 - 110 mmol/L Final   BUN  Date Value Ref Range Status  07/08/2021 15 7 - 25 mg/dL Final  09/17/2015 12 8 - 27 mg/dL Final   Creat  Date Value Ref Range Status  07/08/2021 1.12 (H) 0.50 - 1.05 mg/dL Final   Creatinine, Urine  Date Value Ref Range Status  03/30/2017 176 20 - 320 mg/dL Final   CO2  Date Value Ref Range Status  07/08/2021 25 20 - 32 mmol/L Final   Bicarbonate  Date Value Ref Range Status  04/01/2020 32.0 (H) 20.0 - 28.0 mmol/L Final   GFR, Est African American  Date Value Ref Range Status  03/04/2021 61 > OR = 60 mL/min/1.48m Final   eGFR  Date Value Ref Range Status  07/08/2021 54 (L) > OR = 60 mL/min/1.768mFinal    Comment:    The eGFR is  based on the CKD-EPI 2021 equation. To calculate  the new eGFR from a previous Creatinine or Cystatin C result, go to https://www.kidney.org/professionals/ kdoqi/gfr%5Fcalculator    GFR, Est Non African American  Date Value Ref Range Status  03/04/2021 52 (L) > OR = 60 mL/min/1.737minal   GFR, Estimated  Date Value Ref Range Status  05/06/2021 46 (L) >60 mL/min Final    Comment:    (NOTE) Calculated using the CKD-EPI Creatinine Equation (2021)          Passed - eGFR is 30 or above and within 180 days    GFR, Est African American  Date Value Ref Range Status  03/04/2021 61 > OR = 60 mL/min/1.52m72mnal   GFR, Est Non African American  Date Value Ref Range Status  03/04/2021 52 (L) > OR = 60 mL/min/1.52m283mal   GFR, Estimated  Date Value Ref Range Status  05/06/2021 46 (L) >60 mL/min Final    Comment:    (NOTE) Calculated using the CKD-EPI Creatinine Equation (2021)    eGFR  Date Value Ref Range Status  07/08/2021 54 (L) > OR = 60 mL/min/1.52m2 84ml    Comment:    The eGFR is based on the CKD-EPI 2021 equation. To calculate  the new eGFR from a previous Creatinine or Cystatin C result, go to https://www.kidney.org/professionals/ kdoqi/gfr%5Fcalculator          Passed - Patient is not pregnant      Passed - Valid encounter within last 6 months    Recent Outpatient Visits          3 months ago Need for influenza vaccination   [facility] Kathrine Haddock, NP   5 months ago Exposure to COVID-19 virus   [place], DO   8 months ago Welcome to [employer] preventive visit   [facility] [place], [place], NP   8 months ago [facility] [place], [place], NP   10 months ago Pulmonary nodule   [facility] Carles Collet M, [place]      Future Appointments            In 5 days Teodora Medici, DO [facility], [place]    In 4 months  [place] within normal limits and completed in the last 6 months    WBC  Date Value Ref Range Status  07/08/2021 8.3 3.8 - 10.8 Thousand/uL Final   RBC  Date Value Ref Range Status  07/08/2021 4.51 3.80 - 5.10 Million/uL Final   Hemoglobin  Date Value Ref Range Status  07/08/2021 13.8 11.7 - 15.5 g/dL Final  09/17/2015 16.1 (H) 11.1 - 15.9 g/dL Final   HCT  Date Value Ref Range Status  07/08/2021 41.8 35.0 - 45.0 % Final   Hematocrit  Date Value Ref Range Status  09/17/2015 47.1 (H) 34.0 - 46.6 % Final   MCHC  Date Value Ref Range Status  07/08/2021 33.0 32.0 - 36.0 g/dL Final   [facility]  Date Value Ref Range Status  07/08/2021 30.6 27.0 - 33.0 pg Final   MCV  Date Value Ref Range Status  07/08/2021 92.7 80.0 - 100.0 fL Final  09/17/2015 91 79 - 97 fL Final   No results found for: PLTCOUNTKUC, LABPLAT, POCPLA RDW  Date Value Ref Range Status  07/08/2021 12.2 11.0 - 15.0 % Final  09/17/2015 13.7 12.3 - 15.4 % Final          Oxcarbazepine (TRILEPTAL) 300 MG tablet 30 tablet 0    Sig: Take 1 tablet (300 mg total) by mouth at bedtime.     Neurology: Anticonvulsants - oxcarbazepine Failed - 11/04/2021 12:00 PM      Failed - Cr in normal range and within 360 days    Creat  Date Value Ref Range Status  07/08/2021 1.12 (H) 0.50 - 1.05 mg/dL Final   Creatinine, Urine  Date Value Ref Range Status  03/30/2017 176 20 - 320 mg/dL Final         Passed - Na in normal range and within 360 days    Sodium  Date Value Ref Range Status  07/08/2021 138 135 - 146 mmol/L Final  09/17/2015 136 134 - 144 mmol/L Final    Comment:                  **Please note reference interval change**         Passed - WBC in normal range and within 360 days    WBC  Date Value Ref Range Status  07/08/2021 8.3 3.8 - 10.8 Thousand/uL Final         Passed - PLT in normal range and within 360 days    Platelets  Date  Value Ref Range Status  07/08/2021 195 140 - 400 Thousand/uL Final  09/17/2015 167 150 - 379 x10E3/uL Final         Passed - HCT in normal range and within 360 days    HCT  Date Value Ref  Range Status  07/08/2021 41.8 35.0 - 45.0 % Final   Hematocrit  Date Value Ref Range Status  09/17/2015 47.1 (H) 34.0 - 46.6 % Final         Passed - HGB in normal range and within 360 days    Hemoglobin  Date Value Ref Range Status  07/08/2021 13.8 11.7 - 15.5 g/dL Final  09/17/2015 16.1 (H) 11.1 - 15.9 g/dL Final         Passed - Completed PHQ-2 or PHQ-9 in the last 360 days      Passed - Valid encounter within last 12 months    Recent Outpatient Visits          3 months ago Need for influenza vaccination   [facility] Kathrine Haddock, NP   5 months ago Exposure to COVID-19 virus   [facility] Myles Gip, DO   8 months ago Welcome to [employer] preventive visit   [place], NP   8 months ago [place], NP   10 months ago Pulmonary nodule   [place], [place]      Future Appointments            In 5 days Teodora Medici, [facility], [place]   In 4 months  [facility], [facility]            clopidogrel (PLAVIX) 75 MG tablet 30 tablet 0    Sig: Take 1 tablet (75 mg total) by mouth daily.     Hematology: Antiplatelets - clopidogrel Failed - 11/04/2021 12:00 PM      Failed - Cr in normal range and within 360 days    Creat  Date Value Ref Range Status  07/08/2021 1.12 (H) 0.50 - 1.05 mg/dL Final   Creatinine, Urine  Date Value Ref Range Status  03/30/2017 176 20 - 320 mg/dL Final         Passed - HCT in normal range and within 180 days    HCT  Date Value Ref Range Status  07/08/2021 41.8 35.0 - 45.0 % Final   Hematocrit  Date Value Ref Range Status  09/17/2015 47.1  (H) 34.0 - 46.6 % Final         Passed - HGB in normal range and within 180 days    Hemoglobin  Date Value Ref Range Status  07/08/2021 13.8 11.7 - 15.5 g/dL Final  09/17/2015 16.1 (H) 11.1 - 15.9 g/dL Final         Passed - PLT in normal range and within 180 days    Platelets  Date Value Ref Range Status  07/08/2021 195 140 - 400 Thousand/uL Final  09/17/2015 167 150 - 379 x10E3/uL Final         Passed - Valid encounter within last 6 months    Recent Outpatient Visits          3 months ago Need for influenza vaccination   [facility] Kathrine Haddock, NP   5 months ago Exposure to COVID-19 virus   [place], DO   8 months ago Welcome to [employer] preventive visit   [place], NP   8 months ago [place], NP   10 months ago Pulmonary nodule   [facility]  [facility] [place], [place]      Future Appointments            In 5 days Teodora Medici, [facility], [place]   In 4 months  [place]

## 2021-11-04 NOTE — Telephone Encounter (Signed)
Requested medication (s) are due for refill today: yes  Requested medication (s) are on the active medication list: yes  Last refill:  07/22/20 for lithium, seroquel 04/14/21  Future visit scheduled: yes  Notes to clinic:  Unable to refill per protocol, cannot delegate.      Requested Prescriptions  Pending Prescriptions Disp Refills   QUEtiapine (SEROQUEL) 100 MG tablet 135 tablet 1    Sig: TAKE 1/2 TABLET BY MOUTH ONCE EVERY MORNING AND 1 TAB AT BEDTIME     Not Delegated - Psychiatry:  Antipsychotics - Second Generation (Atypical) - quetiapine Failed - 11/04/2021 12:00 PM      Failed - This refill cannot be delegated      Failed - Lipid Panel in normal range within the last 12 months    Cholesterol, Total  Date Value Ref Range Status  09/17/2015 228 (H) 100 - 199 mg/dL Final   Cholesterol  Date Value Ref Range Status  07/08/2021 126 <200 mg/dL Final   LDL Cholesterol (Calc)  Date Value Ref Range Status  07/08/2021 48 mg/dL (calc) Final    Comment:    Reference range: <100 . Desirable range <100 mg/dL for primary prevention;   <70 mg/dL for patients with CHD or diabetic patients  with > or = 2 CHD risk factors. Marland Kitchen LDL-C is now calculated using the Martin-Hopkins  calculation, which is a validated novel method providing  better accuracy than the Friedewald equation in the  estimation of LDL-C.  Cresenciano Genre et al. Annamaria Helling. 5465;681(27): 2061-2068  (http://education.Quest[facility].com/faq/FAQ164)    HDL  Date Value Ref Range Status  07/08/2021 49 (L) > OR = 50 mg/dL Final  09/17/2015 74 >39 mg/dL Final   Triglycerides  Date Value Ref Range Status  07/08/2021 217 (H) <150 mg/dL Final    Comment:    . If a non-fasting specimen was collected, consider repeat triglyceride testing on a fasting specimen if clinically indicated.  Yates Decamp et al. J. of Clin. Lipidol. 5170;0:174-944. Marland Kitchen          Passed - TSH in normal range and within 360 days    TSH  Date Value Ref  Range Status  05/05/2021 2.020 0.350 - 4.500 uIU/mL Final    Comment:    Performed by a 3rd Generation assay with a functional sensitivity of <=0.01 uIU/mL. Performed at [facility] [address] [place], [place] [address]   03/04/2021 1.16 0.40 - 4.50 mIU/L Final          Passed - Last BP in normal range    BP Readings from Last 1 Encounters:  07/08/21 122/60          Passed - Last Heart Rate in normal range    Pulse Readings from Last 1 Encounters:  07/08/21 95          Passed - Valid encounter within last 6 months    Recent Outpatient Visits           3 months ago Need for influenza vaccination   [facility] Kathrine Haddock, NP   5 months ago Exposure to COVID-19 virus   [place], DO   8 months ago Welcome to [employer] preventive visit   [place], NP   8 months ago [place], NP   10 months ago Pulmonary nodule   [facility] [place], Fabio Bering [place], [place]  Future Appointments             In 5 days Teodora Medici, DO [facility], [place]   In 4 months  [place] within normal limits and completed in the last 12 months    WBC  Date Value Ref Range Status  07/08/2021 8.3 3.8 - 10.8 Thousand/uL Final   RBC  Date Value Ref Range Status  07/08/2021 4.51 3.80 - 5.10 Million/uL Final   Hemoglobin  Date Value Ref Range Status  07/08/2021 13.8 11.7 - 15.5 g/dL Final  09/17/2015 16.1 (H) 11.1 - 15.9 g/dL Final   HCT  Date Value Ref Range Status  07/08/2021 41.8 35.0 - 45.0 % Final   Hematocrit  Date Value Ref Range Status  09/17/2015 47.1 (H) 34.0 - 46.6 % Final   MCHC  Date Value Ref Range Status  07/08/2021 33.0 32.0 - 36.0 g/dL Final   [facility]  Date Value Ref Range Status  07/08/2021 30.6 27.0 - 33.0  pg Final   MCV  Date Value Ref Range Status  07/08/2021 92.7 80.0 - 100.0 fL Final  09/17/2015 91 79 - 97 fL Final   No results found for: PLTCOUNTKUC, LABPLAT, POCPLA RDW  Date Value Ref Range Status  07/08/2021 12.2 11.0 - 15.0 % Final  09/17/2015 13.7 12.3 - 15.4 % Final         Passed - CMP within normal limits and completed in the last 12 months    Albumin  Date Value Ref Range Status  05/06/2021 3.4 (L) 3.5 - 5.0 g/dL Final  06/25/2019 4.5 3.8 - 4.8 g/dL Final   Albumin ELP  Date Value Ref Range Status  03/30/2017 4.0 3.8 - 4.8 g/dL Final   Alkaline Phosphatase  Date Value Ref Range Status  05/06/2021 46 38 - 126 U/L Final   Alkaline phosphatase (APISO)  Date Value Ref Range Status  07/08/2021 75 37 - 153 U/L Final   ALT  Date Value Ref Range Status  07/08/2021 9 6 - 29 U/L Final   AST  Date Value Ref Range Status  07/08/2021 12 10 - 35 U/L Final   BUN  Date Value Ref Range Status  07/08/2021 15 7 - 25 mg/dL Final  09/17/2015 12 8 - 27 mg/dL Final   Calcium  Date Value Ref Range Status  07/08/2021 10.2 8.6 - 10.4 mg/dL Final   CO2  Date Value Ref Range Status  07/08/2021 25 20 - 32 mmol/L Final   Bicarbonate  Date Value Ref Range Status  04/01/2020 32.0 (H) 20.0 - 28.0 mmol/L Final   Creat  Date Value Ref Range Status  07/08/2021 1.12 (H) 0.50 - 1.05 mg/dL Final   Creatinine, Urine  Date Value Ref Range Status  03/30/2017 176 20 - 320 mg/dL Final   Glucose, Bld  Date Value Ref Range Status  07/08/2021 81 65 - 99 mg/dL Final    Comment:    .            Fasting reference interval .    Glucose-Capillary  Date Value Ref Range Status  04/07/2020 94 70 - 99 mg/dL Final    Comment:    Glucose reference range applies only to samples taken after fasting for at least 8 hours.   Potassium  Date Value Ref Range Status  07/08/2021 5.0 3.5 - 5.3 mmol/L Final   Sodium  Date Value Ref Range Status  07/08/2021 138 135 - 146 mmol/L Final   09/17/2015 136 134 - 144 mmol/L Final    Comment:                  **Please note reference interval change**   Total Bilirubin  Date Value Ref Range Status  07/08/2021 0.3 0.2 - 1.2 mg/dL Final   Bilirubin Total  Date Value Ref Range Status  06/25/2019 0.5 0.0 - 1.2 mg/dL Final   Bilirubin, Direct  Date Value Ref Range Status  06/25/2019 0.10 0.00 - 0.40 mg/dL Final   Indirect Bilirubin  Date Value Ref Range Status  08/29/2017 NOT CALCULATED 0.3 - 0.9 mg/dL Final   Protein, ur  Date Value Ref Range Status  05/05/2021 NEGATIVE NEGATIVE mg/dL Final   Total Protein, Urine  Date Value Ref Range Status  03/30/2017 177 (H) 5 - 24 mg/dL Final   Total Protein  Date Value Ref Range Status  07/08/2021 7.7 6.1 - 8.1 g/dL Final  06/25/2019 7.7 6.0 - 8.5 g/dL Final   Total Protein, Serum Electrophoresis  Date Value Ref Range Status  03/30/2017 7.8 6.1 - 8.1 g/dL Final   GFR, Est African American  Date Value Ref Range Status  03/04/2021 61 > OR = 60 mL/min/1.40m2 Final   eGFR  Date Value Ref Range Status  07/08/2021 54 (L) > OR = 60 mL/min/1.40m2 Final    Comment:    The eGFR is based on the CKD-EPI 2021 equation. To calculate  the new eGFR from a previous Creatinine or Cystatin C result, go to https://www.kidney.org/professionals/ kdoqi/gfr%5Fcalculator    GFR, Est Non African American  Date Value Ref Range Status  03/04/2021 52 (L) > OR = 60 mL/min/1.48m2 Final   GFR, Estimated  Date Value Ref Range Status  05/06/2021 46 (L) >60 mL/min Final    Comment:    (NOTE) Calculated using the CKD-EPI Creatinine Equation (2021)           lithium carbonate 300 MG capsule 60 capsule 0    Sig: Take 1 capsule (300 mg total) by mouth 2 (two) times daily with a meal.     Not Delegated - Psychiatry:  Antimanics/Bipolar Agents Failed - 11/04/2021 12:00 PM      Failed - This refill cannot be delegated      Failed - TSH in normal range and within 180 days    TSH  Date Value  Ref Range Status  05/05/2021 2.020 0.350 - 4.500 uIU/mL Final    Comment:    Performed by a 3rd Generation assay with a functional sensitivity of <=0.01 uIU/mL. Performed at [facility], [address] [place], [place] [address]   03/04/2021 1.16 0.40 - 4.50 mIU/L Final          Failed - Lithium Level in normal range and within 180 days    Lithium Lvl  Date Value Ref Range Status  03/28/2020 0.69 0.60 - 1.20 mmol/L Final    Comment:    Performed at [facility], [address] [place], [place] [address]          Failed - BMP within normal limits in the last 6 months    Glucose, Bld  Date Value Ref Range Status  07/08/2021 81 65 - 99 mg/dL Final    Comment:    .            Fasting reference interval .    Glucose-Capillary  Date Value  Ref Range Status  04/07/2020 94 70 - 99 mg/dL Final    Comment:    Glucose reference range applies only to samples taken after fasting for at least 8 hours.   Calcium  Date Value Ref Range Status  07/08/2021 10.2 8.6 - 10.4 mg/dL Final   Sodium  Date Value Ref Range Status  07/08/2021 138 135 - 146 mmol/L Final  09/17/2015 136 134 - 144 mmol/L Final    Comment:                  **Please note reference interval change**   Potassium  Date Value Ref Range Status  07/08/2021 5.0 3.5 - 5.3 mmol/L Final   Chloride  Date Value Ref Range Status  07/08/2021 107 98 - 110 mmol/L Final   BUN  Date Value Ref Range Status  07/08/2021 15 7 - 25 mg/dL Final  09/17/2015 12 8 - 27 mg/dL Final   Creat  Date Value Ref Range Status  07/08/2021 1.12 (H) 0.50 - 1.05 mg/dL Final   Creatinine, Urine  Date Value Ref Range Status  03/30/2017 176 20 - 320 mg/dL Final   CO2  Date Value Ref Range Status  07/08/2021 25 20 - 32 mmol/L Final   Bicarbonate  Date Value Ref Range Status  04/01/2020 32.0 (H) 20.0 - 28.0 mmol/L Final   GFR, Est African American  Date Value Ref Range Status  03/04/2021 61 > OR = 60  mL/min/1.69m Final   eGFR  Date Value Ref Range Status  07/08/2021 54 (L) > OR = 60 mL/min/1.721mFinal    Comment:    The eGFR is based on the CKD-EPI 2021 equation. To calculate  the new eGFR from a previous Creatinine or Cystatin C result, go to https://www.kidney.org/professionals/ kdoqi/gfr%5Fcalculator    GFR, Est Non African American  Date Value Ref Range Status  03/04/2021 52 (L) > OR = 60 mL/min/1.7352minal   GFR, Estimated  Date Value Ref Range Status  05/06/2021 46 (L) >60 mL/min Final    Comment:    (NOTE) Calculated using the CKD-EPI Creatinine Equation (2021)          Passed - eGFR is 30 or above and within 180 days    GFR, Est African American  Date Value Ref Range Status  03/04/2021 61 > OR = 60 mL/min/1.12m71mnal   GFR, Est Non African American  Date Value Ref Range Status  03/04/2021 52 (L) > OR = 60 mL/min/1.12m242mal   GFR, Estimated  Date Value Ref Range Status  05/06/2021 46 (L) >60 mL/min Final    Comment:    (NOTE) Calculated using the CKD-EPI Creatinine Equation (2021)    eGFR  Date Value Ref Range Status  07/08/2021 54 (L) > OR = 60 mL/min/1.12m2 20ml    Comment:    The eGFR is based on the CKD-EPI 2021 equation. To calculate  the new eGFR from a previous Creatinine or Cystatin C result, go to https://www.kidney.org/professionals/ kdoqi/gfr%5Fcalculator           Passed - Patient is not pregnant      Passed - Valid encounter within last 6 months    Recent Outpatient Visits           3 months ago Need for influenza vaccination   [facility] 5 months ago Exposure to COVID-19 virus   [facility] 8 months ago Welcome to  Medicare preventive visit   [facility] [place], [place], NP   8 months ago [facility] [place], [place], NP   10 months ago Pulmonary nodule   [place], [place]       Future Appointments             In 5 days Teodora Medici, DO [facility], [place]   In 4 months  [place] within normal limits and completed in the last 6 months    WBC  Date Value Ref Range Status  07/08/2021 8.3 3.8 - 10.8 Thousand/uL Final   RBC  Date Value Ref Range Status  07/08/2021 4.51 3.80 - 5.10 Million/uL Final   Hemoglobin  Date Value Ref Range Status  07/08/2021 13.8 11.7 - 15.5 g/dL Final  09/17/2015 16.1 (H) 11.1 - 15.9 g/dL Final   HCT  Date Value Ref Range Status  07/08/2021 41.8 35.0 - 45.0 % Final   Hematocrit  Date Value Ref Range Status  09/17/2015 47.1 (H) 34.0 - 46.6 % Final   MCHC  Date Value Ref Range Status  07/08/2021 33.0 32.0 - 36.0 g/dL Final   [facility]  Date Value Ref Range Status  07/08/2021 30.6 27.0 - 33.0 pg Final   MCV  Date Value Ref Range Status  07/08/2021 92.7 80.0 - 100.0 fL Final  09/17/2015 91 79 - 97 fL Final   No results found for: PLTCOUNTKUC, LABPLAT, POCPLA RDW  Date Value Ref Range Status  07/08/2021 12.2 11.0 - 15.0 % Final  09/17/2015 13.7 12.3 - 15.4 % Final         Signed Prescriptions Disp Refills   atorvastatin (LIPITOR) 20 MG tablet 90 tablet 0    Sig: Take 1 tablet (20 mg total) by mouth at bedtime.     Cardiovascular:  Antilipid - Statins Failed - 11/04/2021 12:00 PM      Failed - Lipid Panel in normal range within the last 12 months    Cholesterol, Total  Date Value Ref Range Status  09/17/2015 228 (H) 100 - 199 mg/dL Final   Cholesterol  Date Value Ref Range Status  07/08/2021 126 <200 mg/dL Final   LDL Cholesterol (Calc)  Date Value Ref Range Status  07/08/2021 48 mg/dL (calc) Final    Comment:    Reference range: <100 . Desirable range <100 mg/dL for primary prevention;   <70 mg/dL for patients with CHD or diabetic patients  with > or = 2 CHD risk factors. Marland Kitchen LDL-C is now  calculated using the Martin-Hopkins  calculation, which is a validated novel method providing  better accuracy than the Friedewald equation in the  estimation of LDL-C.  Cresenciano Genre et al. Annamaria Helling. 2094;709(62): 2061-2068  (http://education.[facility].com/faq/FAQ164)    HDL  Date Value Ref Range Status  07/08/2021 49 (L) > OR = 50 mg/dL Final  09/17/2015 74 >39 mg/dL Final   Triglycerides  Date Value Ref Range Status  07/08/2021 217 (H) <150 mg/dL Final    Comment:    . If a non-fasting specimen was collected, consider repeat triglyceride testing on a fasting specimen if clinically indicated.  Yates Decamp et al. J. of Clin. Lipidol. 8366;2:947-654. Marland Kitchen          Passed - Patient is not pregnant      Passed - Valid encounter within last  12 months    Recent Outpatient Visits           3 months ago Need for influenza vaccination   [facility] Kathrine Haddock, NP   5 months ago Exposure to COVID-19 virus   [facility] Myles Gip, DO   8 months ago Welcome to [employer] preventive visit   [place], NP   8 months ago [facility] Kathrine Haddock, NP   10 months ago Pulmonary nodule   [place], [place]       Future Appointments             In 5 days Teodora Medici, [facility], [place]   In 4 months  [facility]             losartan (COZAAR) 50 MG tablet 90 tablet 0    Sig: Take 1 tablet (50 mg total) by mouth daily.     Cardiovascular:  Angiotensin Receptor Blockers Failed - 11/04/2021 12:00 PM      Failed - Cr in normal range and within 180 days    Creat  Date Value Ref Range Status  07/08/2021 1.12 (H) 0.50 - 1.05 mg/dL Final   Creatinine, Urine  Date Value Ref Range Status  03/30/2017 176 20 - 320 mg/dL Final          Passed - K in normal range and within  180 days    Potassium  Date Value Ref Range Status  07/08/2021 5.0 3.5 - 5.3 mmol/L Final          Passed - Patient is not pregnant      Passed - Last BP in normal range    BP Readings from Last 1 Encounters:  07/08/21 122/60          Passed - Valid encounter within last 6 months    Recent Outpatient Visits           3 months ago Need for influenza vaccination   [facility] Kathrine Haddock, NP   5 months ago Exposure to COVID-19 virus   [place], DO   8 months ago Welcome to [employer] preventive visit   [place], NP   8 months ago [place], NP   10 months ago Pulmonary nodule   [place], [place]       Future Appointments             In 5 days Teodora Medici, [facility], [place]   In 4 months  [facility], [facility]             amLODipine (NORVASC) 10 MG tablet 90 tablet 0    Sig: Take 1 tablet (10 mg total) by mouth daily.     Cardiovascular: Calcium Channel Blockers 2 Passed - 11/04/2021 12:00 PM      Passed - Last BP in normal range    BP Readings from Last 1 Encounters:  07/08/21 122/60          Passed - Last Heart Rate in normal range    Pulse Readings from Last 1 Encounters:  07/08/21 95          Passed - Valid encounter within last  6 months    Recent Outpatient Visits           3 months ago Need for influenza vaccination   [facility] Kathrine Haddock, NP   5 months ago Exposure to COVID-19 virus   [facility] Myles Gip, DO   8 months ago Welcome to [employer] preventive visit   [place], NP   8 months ago [place], NP   10 months ago Pulmonary nodule   [place], [place]       Future Appointments             In 5 days Teodora Medici, [facility], [place]   In 4 months  [facility], [facility]             hydrOXYzine (ATARAX) 25 MG tablet 180 tablet 0    Sig: Take 1-2 tablets (25-50 mg total) by mouth 3 (three) times daily as needed for itching.     Ear, Nose, and Throat:  Antihistamines 2 Failed - 11/04/2021 12:00 PM      Failed - Cr in normal range and within 360 days    Creat  Date Value Ref Range Status  07/08/2021 1.12 (H) 0.50 - 1.05 mg/dL Final   Creatinine, Urine  Date Value Ref Range Status  03/30/2017 176 20 - 320 mg/dL Final          Passed - Valid encounter within last 12 months    Recent Outpatient Visits           3 months ago Need for influenza vaccination   [facility] Kathrine Haddock, NP   5 months ago Exposure to COVID-19 virus   [place], DO   8 months ago Welcome to [employer] preventive visit   [place], NP   8 months ago [place], NP   10 months ago Pulmonary nodule   [place], [place]       Future Appointments             In 5 days Teodora Medici, [facility], [place]   In 4 months  [facility], [facility]             Oxcarbazepine (TRILEPTAL) 300 MG tablet 90 tablet 0    Sig: Take 1 tablet (300 mg total) by mouth at bedtime.     Neurology: Anticonvulsants - oxcarbazepine Failed - 11/04/2021 12:00 PM      Failed - Cr in normal range and within 360 days    Creat  Date Value Ref Range Status  07/08/2021 1.12 (H) 0.50 - 1.05 mg/dL Final   Creatinine, Urine  Date Value Ref Range Status  03/30/2017 176 20 - 320 mg/dL Final          Passed - Na in normal range and within 360 days    Sodium  Date Value Ref Range  Status  07/08/2021 138 135 - 146 mmol/L Final  09/17/2015 136 134 - 144 mmol/L Final    Comment:                  **Please note reference interval change**          Passed -  WBC in normal range and within 360 days    WBC  Date Value Ref Range Status  07/08/2021 8.3 3.8 - 10.8 Thousand/uL Final          Passed - PLT in normal range and within 360 days    Platelets  Date Value Ref Range Status  07/08/2021 195 140 - 400 Thousand/uL Final  09/17/2015 167 150 - 379 x10E3/uL Final          Passed - HCT in normal range and within 360 days    HCT  Date Value Ref Range Status  07/08/2021 41.8 35.0 - 45.0 % Final   Hematocrit  Date Value Ref Range Status  09/17/2015 47.1 (H) 34.0 - 46.6 % Final          Passed - HGB in normal range and within 360 days    Hemoglobin  Date Value Ref Range Status  07/08/2021 13.8 11.7 - 15.5 g/dL Final  09/17/2015 16.1 (H) 11.1 - 15.9 g/dL Final          Passed - Completed PHQ-2 or PHQ-9 in the last 360 days      Passed - Valid encounter within last 12 months    Recent Outpatient Visits           3 months ago Need for influenza vaccination   [facility] Kathrine Haddock, NP   5 months ago Exposure to COVID-19 virus   [place], DO   8 months ago Welcome to [employer] preventive visit   [place], NP   8 months ago [place], NP   10 months ago Pulmonary nodule   [place], [place]       Future Appointments             In 5 days Teodora Medici, [facility], [place]   In 4 months  [facility], [facility]             clopidogrel (PLAVIX) 75 MG tablet 90 tablet 0    Sig: Take 1 tablet (75 mg total) by mouth daily.     Hematology: Antiplatelets - clopidogrel Failed - 11/04/2021 12:00 PM      Failed - Cr in  normal range and within 360 days    Creat  Date Value Ref Range Status  07/08/2021 1.12 (H) 0.50 - 1.05 mg/dL Final   Creatinine, Urine  Date Value Ref Range Status  03/30/2017 176 20 - 320 mg/dL Final          Passed - HCT in normal range and within 180 days    HCT  Date Value Ref Range Status  07/08/2021 41.8 35.0 - 45.0 % Final   Hematocrit  Date Value Ref Range Status  09/17/2015 47.1 (H) 34.0 - 46.6 % Final          Passed - HGB in normal range and within 180 days    Hemoglobin  Date Value Ref Range Status  07/08/2021 13.8 11.7 - 15.5 g/dL Final  09/17/2015 16.1 (H) 11.1 - 15.9 g/dL Final          Passed - PLT in normal range and within 180 days    Platelets  Date Value Ref Range Status  07/08/2021 195 140 - 400 Thousand/uL Final  09/17/2015 167 150 - 379 x10E3/uL Final  Passed - Valid encounter within last 6 months    Recent Outpatient Visits           3 months ago Need for influenza vaccination   [facility] Kathrine Haddock, NP   5 months ago Exposure to COVID-19 virus   [place], DO   8 months ago Welcome to [employer] preventive visit   [place], NP   8 months ago [place], NP   10 months ago Pulmonary nodule   [facility] [place], [place]       Future Appointments             In 5 days Teodora Medici, [facility], [place]   In 4 months  [facility], [facility]

## 2021-11-04 NOTE — Telephone Encounter (Addendum)
Medication Refill - Medication: atorvastatin (LIPITOR) 20 MG tablet,losartan (COZAAR) 50 MG tablet,amLODipine (NORVASC) 10 MG tablet hydrOXYzine (ATARAX/VISTARIL) 25 MG tablet,QUEtiapine (SEROQUEL) 100 MG tablet,lithium carbonate 300 MG capsule Oxcarbazepine (TRILEPTAL) 300 MG tablet,clopidogrel (PLAVIX) 75 MG tablet  Has the patient contacted their pharmacy? No. [employer] at home requesting 100 day supply so insurance will cover medication.   (Agent: If no, request that the patient contact the pharmacy for the refill. If patient does not wish to contact the pharmacy document the reason why and proceed with request.)   Preferred Pharmacy (with phone number or street name):  [facility] [place], [place] [address]  Phone: [phone] Fax: [phone]  Hours: Not open 24 hours   Has the patient been seen for an appointment in the last year OR does the patient have an upcoming appointment? Yes.    Agent: Please be advised that RX refills may take up to 3 business days. We ask that you follow-up with your pharmacy.

## 2021-11-05 ENCOUNTER — Other Ambulatory Visit: Payer: Self-pay

## 2021-11-05 MED ORDER — LITHIUM CARBONATE 300 MG PO CAPS: 300.0000 mg | ORAL_CAPSULE | Freq: Two times a day (BID) | ORAL | 0 refills | Status: DC

## 2021-11-05 MED ORDER — QUETIAPINE FUMARATE 100 MG PO TABS: ORAL_TABLET | ORAL | 1 refills | Status: DC

## 2021-11-09 ENCOUNTER — Ambulatory Visit (INDEPENDENT_AMBULATORY_CARE_PROVIDER_SITE_OTHER): Payer: Medicare Other | Admitting: Internal Medicine

## 2021-11-09 ENCOUNTER — Telehealth: Payer: Self-pay

## 2021-11-09 ENCOUNTER — Encounter: Payer: Self-pay | Admitting: Internal Medicine

## 2021-11-09 VITALS — BP 124/64 | HR 95 | Temp 97.6°F | Resp 18 | Ht 65.0 in | Wt 135.2 lb

## 2021-11-09 DIAGNOSIS — E782 Mixed hyperlipidemia: Secondary | ICD-10-CM

## 2021-11-09 DIAGNOSIS — F3160 Bipolar disorder, current episode mixed, unspecified: Secondary | ICD-10-CM

## 2021-11-09 DIAGNOSIS — I10 Essential (primary) hypertension: Secondary | ICD-10-CM | POA: Diagnosis not present

## 2021-11-09 DIAGNOSIS — I441 Atrioventricular block, second degree: Secondary | ICD-10-CM | POA: Diagnosis not present

## 2021-11-09 DIAGNOSIS — Z23 Encounter for immunization: Secondary | ICD-10-CM

## 2021-11-09 DIAGNOSIS — Z122 Encounter for screening for malignant neoplasm of respiratory organs: Secondary | ICD-10-CM

## 2021-11-09 DIAGNOSIS — Z950 Presence of cardiac pacemaker: Secondary | ICD-10-CM

## 2021-11-09 DIAGNOSIS — G40209 Localization-related (focal) (partial) symptomatic epilepsy and epileptic syndromes with complex partial seizures, not intractable, without status epilepticus: Secondary | ICD-10-CM

## 2021-11-09 DIAGNOSIS — Z87891 Personal history of nicotine dependence: Secondary | ICD-10-CM

## 2021-11-09 DIAGNOSIS — J449 Chronic obstructive pulmonary disease, unspecified: Secondary | ICD-10-CM

## 2021-11-09 DIAGNOSIS — H9193 Unspecified hearing loss, bilateral: Secondary | ICD-10-CM

## 2021-11-09 NOTE — Assessment & Plan Note (Signed)
Following with Psychiatry.

## 2021-11-09 NOTE — Assessment & Plan Note (Signed)
Stable, no changes to medications made.

## 2021-11-09 NOTE — Telephone Encounter (Signed)
Copied from [place] [id number]. Topic: General - Other >> Nov 09, 2021  2:01 PM Tessa Lerner A wrote: Reason for CRM: [facility] has called to notify the practice that their office will be unable to accept the patient's referral due to a history of no show appts   Please contact the patient further if needed

## 2021-11-09 NOTE — Patient Instructions (Addendum)
It was great seeing you today!  Plan discussed at today's visit: -Referral to ENT -Pneumonia vaccine given today -Lung cancer screening ordered today as well   Follow up in: 4 months   Take care and let us know if you have any questions or concerns prior to your next visit.  Dr. Rosana Berger

## 2021-11-09 NOTE — Addendum Note (Signed)
Addended by: Teodora Medici on: 11/09/2021 12:55 PM   Modules accepted: Orders

## 2021-11-09 NOTE — Assessment & Plan Note (Signed)
Following with Neurology, no seizures in the last 10 years, not currently on medication.

## 2021-11-09 NOTE — Assessment & Plan Note (Signed)
Discussed at length, continues to smoke. Will order lung cancer screening.
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
## 2021-11-09 NOTE — Assessment & Plan Note (Signed)
Following with Cardiology, has pacemaker.

## 2021-11-09 NOTE — Assessment & Plan Note (Signed)
Continues to complain of daily cough, SOB, wheezing despite Trelegy and Albuterol and continuing to smoke. Discussed this at length. Following with Pulmonology. Hx of severe left sided pneumonia.

## 2021-11-09 NOTE — Assessment & Plan Note (Signed)
Stable, reviewed last lipid panel. Continue statin.

## 2021-12-10 LAB — CBC AND DIFFERENTIAL
HCT: 53 — AB (ref 36–46)
Hemoglobin: 17 — AB (ref 12.0–16.0)
Platelets: 123 10*3/uL — AB (ref 150–400)
WBC: 9.9

## 2021-12-10 LAB — TSH: TSH: 2.58 (ref 0.41–5.90)

## 2021-12-10 LAB — BASIC METABOLIC PANEL
BUN: 17 (ref 4–21)
CO2: 8 — AB (ref 13–22)
Chloride: 118 — AB (ref 99–108)
Glucose: 87
Potassium: 6.1 mEq/L — AB (ref 3.5–5.1)
Sodium: 154 — AB (ref 137–147)

## 2021-12-10 LAB — CBC: RBC: 5.5 — AB (ref 3.87–5.11)

## 2021-12-10 LAB — HEMOGLOBIN A1C: Hemoglobin A1C: 5.2

## 2021-12-10 LAB — COMPREHENSIVE METABOLIC PANEL: Calcium: 11.7 — AB (ref 8.7–10.7)

## 2021-12-20 ENCOUNTER — Telehealth: Payer: Self-pay

## 2021-12-20 NOTE — Telephone Encounter (Signed)
Pt needs an appointment to follow-up for abnormal labwork done at [facility]. Per Kristeen Miss request. ?

## 2021-12-21 ENCOUNTER — Encounter: Payer: Self-pay | Admitting: Family Medicine

## 2021-12-23 ENCOUNTER — Ambulatory Visit: Payer: Medicare Other | Admitting: Family Medicine

## 2021-12-24 ENCOUNTER — Telehealth: Payer: Medicare Other | Admitting: Family Medicine

## 2021-12-27 ENCOUNTER — Encounter: Payer: Medicare Other | Admitting: Internal Medicine

## 2021-12-28 NOTE — Progress Notes (Signed)
This encounter was created in error - please disregard.

## 2022-01-03 ENCOUNTER — Telehealth: Payer: Self-pay | Admitting: *Deleted

## 2022-01-03 ENCOUNTER — Other Ambulatory Visit: Payer: Self-pay | Admitting: *Deleted

## 2022-01-03 DIAGNOSIS — Z87891 Personal history of nicotine dependence: Secondary | ICD-10-CM

## 2022-01-03 DIAGNOSIS — Z122 Encounter for screening for malignant neoplasm of respiratory organs: Secondary | ICD-10-CM

## 2022-01-03 LAB — BASIC METABOLIC PANEL
BUN/Creatinine Ratio: 12 (calc) (ref 6–22)
BUN: 13 mg/dL (ref 7–25)
CO2: 23 mmol/L (ref 20–32)
Calcium: 10.2 mg/dL (ref 8.6–10.4)
Chloride: 108 mmol/L (ref 98–110)
Creat: 1.12 mg/dL — ABNORMAL HIGH (ref 0.50–1.05)
Glucose, Bld: 115 mg/dL — ABNORMAL HIGH (ref 65–99)
Potassium: 4.3 mmol/L (ref 3.5–5.3)
Sodium: 138 mmol/L (ref 135–146)

## 2022-01-03 LAB — CBC WITH DIFFERENTIAL/PLATELET
Absolute Monocytes: 360 cells/uL (ref 200–950)
Basophils Absolute: 108 cells/uL (ref 0–200)
Basophils Relative: 1.5 %
Eosinophils Absolute: 202 cells/uL (ref 15–500)
Eosinophils Relative: 2.8 %
HCT: 44.2 % (ref 35.0–45.0)
Hemoglobin: 14.6 g/dL (ref 11.7–15.5)
Lymphs Abs: 2290 cells/uL (ref 850–3900)
MCH: 30.9 pg (ref 27.0–33.0)
MCHC: 33 g/dL (ref 32.0–36.0)
MCV: 93.4 fL (ref 80.0–100.0)
MPV: 10.5 fL (ref 7.5–12.5)
Monocytes Relative: 5 %
Neutro Abs: 4241 cells/uL (ref 1500–7800)
Neutrophils Relative %: 58.9 %
Platelets: 174 10*3/uL (ref 140–400)
RBC: 4.73 10*6/uL (ref 3.80–5.10)
RDW: 12 % (ref 11.0–15.0)
Total Lymphocyte: 31.8 %
WBC: 7.2 10*3/uL (ref 3.8–10.8)

## 2022-01-03 NOTE — Telephone Encounter (Signed)
Left message for pt to call back to schedule f/u lung screening CT .  ?

## 2022-01-13 ENCOUNTER — Ambulatory Visit
Admission: RE | Admit: 2022-01-13 | Discharge: 2022-01-13 | Disposition: A | Discharge status: Home / Self Care | Payer: Medicare Other | Source: Ambulatory Visit | Attending: Acute Care | Admitting: Acute Care

## 2022-01-13 DIAGNOSIS — Z122 Encounter for screening for malignant neoplasm of respiratory organs: Secondary | ICD-10-CM

## 2022-01-13 DIAGNOSIS — Z87891 Personal history of nicotine dependence: Secondary | ICD-10-CM

## 2022-01-14 ENCOUNTER — Other Ambulatory Visit: Payer: Self-pay | Admitting: Acute Care

## 2022-01-14 DIAGNOSIS — Z122 Encounter for screening for malignant neoplasm of respiratory organs: Secondary | ICD-10-CM

## 2022-01-14 DIAGNOSIS — Z87891 Personal history of nicotine dependence: Secondary | ICD-10-CM

## 2022-01-14 DIAGNOSIS — F17210 Nicotine dependence, cigarettes, uncomplicated: Secondary | ICD-10-CM

## 2022-01-18 ENCOUNTER — Ambulatory Visit: Admission: RE | Admit: 2022-01-18 | Payer: Medicare Other | Source: Ambulatory Visit

## 2022-01-31 ENCOUNTER — Other Ambulatory Visit: Payer: Self-pay | Admitting: Family Medicine

## 2022-01-31 DIAGNOSIS — J449 Chronic obstructive pulmonary disease, unspecified: Secondary | ICD-10-CM

## 2022-03-07 ENCOUNTER — Encounter: Payer: Self-pay | Admitting: Family Medicine

## 2022-03-07 ENCOUNTER — Other Ambulatory Visit: Payer: Self-pay

## 2022-03-07 ENCOUNTER — Ambulatory Visit (INDEPENDENT_AMBULATORY_CARE_PROVIDER_SITE_OTHER): Payer: Medicare Other | Admitting: Family Medicine

## 2022-03-07 VITALS — BP 122/70 | HR 65 | Temp 98.6°F | Resp 16 | Ht 65.0 in | Wt 131.3 lb

## 2022-03-07 DIAGNOSIS — K7030 Alcoholic cirrhosis of liver without ascites: Secondary | ICD-10-CM

## 2022-03-07 DIAGNOSIS — S069X5S Unspecified intracranial injury with loss of consciousness greater than 24 hours with return to pre-existing conscious level, sequela: Secondary | ICD-10-CM

## 2022-03-07 DIAGNOSIS — Z950 Presence of cardiac pacemaker: Secondary | ICD-10-CM

## 2022-03-07 DIAGNOSIS — I10 Essential (primary) hypertension: Secondary | ICD-10-CM

## 2022-03-07 DIAGNOSIS — F319 Bipolar disorder, unspecified: Secondary | ICD-10-CM | POA: Diagnosis not present

## 2022-03-07 DIAGNOSIS — I25119 Atherosclerotic heart disease of native coronary artery with unspecified angina pectoris: Secondary | ICD-10-CM

## 2022-03-07 DIAGNOSIS — E782 Mixed hyperlipidemia: Secondary | ICD-10-CM

## 2022-03-07 DIAGNOSIS — R918 Other nonspecific abnormal finding of lung field: Secondary | ICD-10-CM

## 2022-03-07 DIAGNOSIS — J449 Chronic obstructive pulmonary disease, unspecified: Secondary | ICD-10-CM

## 2022-03-07 DIAGNOSIS — G40209 Localization-related (focal) (partial) symptomatic epilepsy and epileptic syndromes with complex partial seizures, not intractable, without status epilepticus: Secondary | ICD-10-CM

## 2022-03-07 DIAGNOSIS — I739 Peripheral vascular disease, unspecified: Secondary | ICD-10-CM

## 2022-03-07 DIAGNOSIS — R413 Other amnesia: Secondary | ICD-10-CM

## 2022-03-07 DIAGNOSIS — Z1231 Encounter for screening mammogram for malignant neoplasm of breast: Secondary | ICD-10-CM

## 2022-03-07 DIAGNOSIS — I70223 Atherosclerosis of native arteries of extremities with rest pain, bilateral legs: Secondary | ICD-10-CM

## 2022-03-07 DIAGNOSIS — Z8619 Personal history of other infectious and parasitic diseases: Secondary | ICD-10-CM

## 2022-03-07 DIAGNOSIS — Z87891 Personal history of nicotine dependence: Secondary | ICD-10-CM

## 2022-03-07 DIAGNOSIS — I480 Paroxysmal atrial fibrillation: Secondary | ICD-10-CM

## 2022-03-07 MED ORDER — LITHIUM CARBONATE 300 MG PO CAPS: 300.0000 mg | ORAL_CAPSULE | Freq: Every day | ORAL | 0 refills | Status: DC

## 2022-03-07 MED ORDER — TRELEGY ELLIPTA 100-62.5-25 MCG/ACT IN AEPB: 1.0000 | INHALATION_SPRAY | Freq: Every day | RESPIRATORY_TRACT | 1 refills | Status: DC

## 2022-03-07 NOTE — Assessment & Plan Note (Signed)
Obtaining LFTs today.

## 2022-03-07 NOTE — Assessment & Plan Note (Signed)
Remains seizure free, continue to monitor. Continue to follow with Neuro.

## 2022-03-07 NOTE — Assessment & Plan Note (Addendum)
RRR today. Cardiology does not recommend anticoagulation due to fall risk. Continue current regimen and risk modification. Counseled on tobacco cessation.

## 2022-03-07 NOTE — Assessment & Plan Note (Signed)
Compliant and doing well with current regimen, continue. Continue to follow with Pulm. Counseled on tobacco cessation.

## 2022-03-07 NOTE — Assessment & Plan Note (Signed)
Continue plavix °

## 2022-03-07 NOTE — Assessment & Plan Note (Signed)
Doing well on current regimen, no changes made today. 

## 2022-03-07 NOTE — Assessment & Plan Note (Signed)
Counseled on cessation. Unwilling to cut down at this time.

## 2022-03-07 NOTE — Assessment & Plan Note (Signed)
Compliant with statin, continue.

## 2022-03-07 NOTE — Progress Notes (Signed)
SUBJECTIVE:   CHIEF COMPLAINT / HPI:   Hypertension, CAD, PAF, SSS: - follows with Cardiology, Dr. Clayborn Bigness, last appt 6/8. - has pacemaker, getting it changed in July. - Medications: plavix, ASA, amlodipine, NTG prn, losartan - Compliance: good, sometimes misses plavix dose - Checking BP at home: yes, unsure of numbers - Denies any SOB, CP, vision changes, LE edema, medication SEs, or symptoms of hypotension  Emphysema/COPD - follows with Pulm, next appt 04/2022 - Medications: albuterol prn, trelegy, duoneb prn - Compliance: good - Exacerbations in last 6 months: none - current smoker, 87 pack years - last used albuterol this morning. - low dose CT for lung cancer screening done 01/14/22, stable nodules seen with new nodule 3.34m, nonsuspicious. Recommending annual screening in 12 months. - smoking 1ppd currently.  HLD - medications: lipitor - compliance: good - medication SEs: none  Cirrhosis - h/o alcoholism, hep C. [facility] Last seen by Dr. WAllen Norrisin 2020, Hep C viral load undetected at that time. Last RUQ [place]2022 with nodular contour but no focal lesion. Previous AFP normal. Per GI, given AFP q6 mths and [place] recommend no further f/u.  Epilepsy, TBI - gabapentin. Follows with Neuro, next appt 04/05/22. Last seizure >10 years ago.  Bipolar d/o - on seroquel, atarax, lithium, trileptal. Follows with Psych. Decreased lithium due to decreased kidney function. Also had periods of waking up and leaving the house in the middle of the night and not acting like herself. Husband states has been more like herself since decreasing lithium dose. Has been taking 1 capsule once daily for about a month. Also only taking seroquel in the evening, skipping am dosing as it makes her sleepy. Last saw Psych in March, typically sees every 3-6 months.  Hearing loss - getting set up with ENT.    OBJECTIVE:   BP 122/70   Pulse 65   Temp 98.6 F (37 C) (Oral)   Resp 16    Ht '5\' 5"'$  (1.651 m)   Wt 131 lb 4.8 oz (59.6 kg)   SpO2 97%   BMI 21.85 kg/m   Gen: well appearing, in NAD Card: RRR Lungs: CTAB Ext: WWP, no edema Psych: pleasant, mood and affect congruent.  ASSESSMENT/PLAN:   Problem List Items Addressed This Visit       Cardiovascular and Mediastinum   Coronary artery disease (Chronic)    Asymptomatic. Continue current regimen. Counseled on tobacco cessation.       Essential hypertension, benign (Chronic)    Doing well on current regimen, no changes made today.      Relevant Orders   Comprehensive metabolic panel   Paroxysmal A-fib (HCC)    RRR today. Cardiology does not recommend anticoagulation due to fall risk. Continue current regimen and risk modification. Counseled on tobacco cessation.         Respiratory   COPD (chronic obstructive pulmonary disease) (HCC) (Chronic)    Compliant and doing well with current regimen, continue. Continue to follow with Pulm. Counseled on tobacco cessation.       Relevant Medications   Fluticasone-Umeclidin-Vilant (TRELEGY ELLIPTA) 100-62.5-25 MCG/ACT AEPB   Lung nodule, multiple     Digestive   Cirrhosis of liver (HCC)    Obtaining LFTs today.      Hx of hepatitis C     Nervous and Auditory   Traumatic brain injury ([place] ([place]ronic)   [place] of [place]    Remains seizure free, continue to  monitor. Continue to follow with Neuro.         Other   Artificial cardiac pacemaker   Bipolar affective disorder ([place])    Doing well with recent decrease in seroquel and lithium dosing, will obtain lithium level today. Continue to follow with Psych.       Relevant Medications   lithium carbonate 300 MG capsule   Other Relevant Orders   Lithium level   Hx of tobacco use, presenting hazards to health    Counseled on cessation. Unwilling to cut down at this time.      Memory impairment   Mixed hyperlipidemia    Compliant with statin, continue.       Other Visit Diagnoses     Encounter for screening mammogram for malignant neoplasm of breast    -  Primary   Relevant Orders   MM DIGITAL SCREENING [place], DO

## 2022-03-07 NOTE — Assessment & Plan Note (Signed)
Doing well with recent decrease in seroquel and lithium dosing, will obtain lithium level today. Continue to follow with Psych.

## 2022-03-07 NOTE — Patient Instructions (Signed)
It was great to see you!  Our plans for today:  - Try mucinex for the phelgm in your throat.  - Stopping smoking is the best thing you can do for your health.  We are checking some labs today, we will release these results to your MyChart.  Take care and seek immediate care sooner if you develop any concerns.   Dr. Ky Barban

## 2022-03-07 NOTE — Assessment & Plan Note (Signed)
Asymptomatic. Continue current regimen. Counseled on tobacco cessation.

## 2022-03-08 ENCOUNTER — Ambulatory Visit: Payer: Medicare Other

## 2022-03-09 ENCOUNTER — Ambulatory Visit: Payer: Medicare Other | Admitting: Internal Medicine

## 2022-03-09 LAB — COMPREHENSIVE METABOLIC PANEL
AG Ratio: 1.4 (calc) (ref 1.0–2.5)
ALT: 10 U/L (ref 6–29)
AST: 13 U/L (ref 10–35)
Albumin: 4.6 g/dL (ref 3.6–5.1)
Alkaline phosphatase (APISO): 72 U/L (ref 37–153)
BUN/Creatinine Ratio: 11 (calc) (ref 6–22)
BUN: 14 mg/dL (ref 7–25)
CO2: 20 mmol/L (ref 20–32)
Calcium: 9.8 mg/dL (ref 8.6–10.4)
Chloride: 108 mmol/L (ref 98–110)
Creat: 1.25 mg/dL — ABNORMAL HIGH (ref 0.50–1.05)
Globulin: 3.4 g/dL (calc) (ref 1.9–3.7)
Glucose, Bld: 126 mg/dL — ABNORMAL HIGH (ref 65–99)
Potassium: 4.6 mmol/L (ref 3.5–5.3)
Sodium: 137 mmol/L (ref 135–146)
Total Bilirubin: 0.5 mg/dL (ref 0.2–1.2)
Total Protein: 8 g/dL (ref 6.1–8.1)

## 2022-03-09 LAB — TEST AUTHORIZATION

## 2022-03-09 LAB — LITHIUM LEVEL: Lithium Lvl: 0.8 mmol/L (ref 0.6–1.2)

## 2022-03-09 NOTE — Progress Notes (Signed)
Spoke to lab tech, will add labs on to blood that they already have.

## 2022-03-31 ENCOUNTER — Telehealth: Payer: Self-pay | Admitting: Family Medicine

## 2022-03-31 NOTE — Telephone Encounter (Signed)
Called husband number straight to operator unable to leave a vm. Called mobile unable to leave vm. I wanted to let patient know I do not know who tried to contact her due to no documentation someone calling her.

## 2022-03-31 NOTE — Telephone Encounter (Signed)
Pt states that they had a missed call from here and would like to know what it was in regards to. Please return her call on her husband phone [phone]

## 2022-03-31 NOTE — Telephone Encounter (Signed)
Speaking to pt and pt husband they both stated they may have mistaken informing us they had a miss call from our office. Pt husband stated they had voicemails related to eye doctor appointment. No questions/concernes from pt.

## 2022-04-19 ENCOUNTER — Other Ambulatory Visit: Payer: Self-pay

## 2022-04-19 ENCOUNTER — Encounter: Payer: Self-pay | Admitting: Cardiology

## 2022-04-19 ENCOUNTER — Encounter
Admission: RE | Disposition: A | Discharge status: Home / Self Care | Payer: Self-pay | Source: Home / Self Care | Attending: Cardiology

## 2022-04-19 ENCOUNTER — Ambulatory Visit
Admission: RE | Admit: 2022-04-19 | Discharge: 2022-04-19 | Disposition: A | Discharge status: Home / Self Care | Payer: Medicare Other | Attending: Cardiology | Admitting: Cardiology

## 2022-04-19 DIAGNOSIS — Z45010 Encounter for checking and testing of cardiac pacemaker pulse generator [battery]: Secondary | ICD-10-CM | POA: Diagnosis not present

## 2022-04-19 DIAGNOSIS — Z01818 Encounter for other preprocedural examination: Secondary | ICD-10-CM

## 2022-04-19 HISTORY — PX: PPM GENERATOR CHANGEOUT: EP1233

## 2022-04-19 SURGERY — PPM GENERATOR CHANGEOUT
Anesthesia: Moderate Sedation

## 2022-04-19 MED ORDER — LIDOCAINE HCL (PF) 1 % IJ SOLN
INTRAMUSCULAR | Status: DC | PRN
  Administered 2022-04-19: 30 mL

## 2022-04-19 MED ORDER — CEFAZOLIN SODIUM-DEXTROSE 2-4 GM/100ML-% IV SOLN
INTRAVENOUS | Status: AC
  Filled 2022-04-19: qty 100

## 2022-04-19 MED ORDER — MIDAZOLAM HCL 2 MG/2ML IJ SOLN
INTRAMUSCULAR | Status: AC
  Filled 2022-04-19: qty 2

## 2022-04-19 MED ORDER — LIDOCAINE HCL 1 % IJ SOLN
INTRAMUSCULAR | Status: AC
  Filled 2022-04-19: qty 40

## 2022-04-19 MED ORDER — SODIUM CHLORIDE 0.9 % IV SOLN
INTRAVENOUS | Status: DC | PRN
  Administered 2022-04-19: 80 mg

## 2022-04-19 MED ORDER — CEPHALEXIN 250 MG PO CAPS: 500.0000 mg | ORAL_CAPSULE | Freq: Two times a day (BID) | ORAL | 0 refills | Status: DC

## 2022-04-19 MED ORDER — ONDANSETRON HCL 4 MG/2ML IJ SOLN: 4.0000 mg | Freq: Four times a day (QID) | INTRAMUSCULAR | Status: DC | PRN

## 2022-04-19 MED ORDER — CEFAZOLIN SODIUM-DEXTROSE 1-4 GM/50ML-% IV SOLN
INTRAVENOUS | Status: DC | PRN
  Administered 2022-04-19: 2 g via INTRAVENOUS

## 2022-04-19 MED ORDER — CEFAZOLIN SODIUM-DEXTROSE 2-4 GM/100ML-% IV SOLN: 2.0000 g | INTRAVENOUS | Status: DC

## 2022-04-19 MED ORDER — SODIUM CHLORIDE 0.9 % IV SOLN: INTRAVENOUS | Status: DC

## 2022-04-19 MED ORDER — MIDAZOLAM HCL 2 MG/2ML IJ SOLN
INTRAMUSCULAR | Status: DC | PRN
  Administered 2022-04-19: 1 mg via INTRAVENOUS

## 2022-04-19 MED ORDER — FENTANYL CITRATE (PF) 100 MCG/2ML IJ SOLN
INTRAMUSCULAR | Status: DC | PRN
  Administered 2022-04-19: 50 ug via INTRAVENOUS

## 2022-04-19 MED ORDER — ACETAMINOPHEN 325 MG PO TABS: 325.0000 mg | ORAL_TABLET | ORAL | Status: DC | PRN

## 2022-04-19 MED ORDER — SODIUM CHLORIDE 0.9 % IV SOLN
80.0000 mg | INTRAVENOUS | Status: DC
  Filled 2022-04-19 (×2): qty 2

## 2022-04-19 MED ORDER — CHLORHEXIDINE GLUCONATE CLOTH 2 % EX PADS: 6.0000 | MEDICATED_PAD | Freq: Every day | CUTANEOUS | Status: DC

## 2022-04-19 MED ORDER — FENTANYL CITRATE (PF) 100 MCG/2ML IJ SOLN
INTRAMUSCULAR | Status: AC
  Filled 2022-04-19: qty 2

## 2022-04-19 SURGICAL SUPPLY — 18 items
CABLE SURG 12 DISP A/V CHANNEL (MISCELLANEOUS) ×3 IMPLANT
COVER SURGICAL LIGHT HANDLE (MISCELLANEOUS) ×1 IMPLANT
DEVICE DSSCT PLSMBLD 3.0S LGHT (MISCELLANEOUS) IMPLANT
DRAPE INCISE 23X17 IOBAN STRL (DRAPES) ×1
DRAPE INCISE 23X17 STRL (DRAPES) IMPLANT
DRAPE INCISE IOBAN 23X17 STRL (DRAPES) ×1 IMPLANT
IPG PACE AZUR XT DR MRI W1DR01 (Pacemaker) IMPLANT
PACE AZURE XT DR MRI W1DR01 (Pacemaker) ×2 IMPLANT
PAD ELECT DEFIB RADIOL ZOLL (MISCELLANEOUS) ×1 IMPLANT
PLASMABLADE 3.0S W/LIGHT (MISCELLANEOUS) ×2
POUCH AIGIS-R ANTIBACT PPM (Mesh General) ×2 IMPLANT
POUCH AIGIS-R ANTIBACT PPM MED (Mesh General) IMPLANT
SUT VIC AB 2-0 CT1 27 (SUTURE) ×1
SUT VIC AB 2-0 CT1 TAPERPNT 27 (SUTURE) IMPLANT
SUT VICRYL 4-0  27 PS-2 BARIAT (SUTURE) ×1
SUT VICRYL 4-0 27 PS-2 BARIAT (SUTURE) ×1
SUTURE VICRYL 4-0 27 PS-2 BART (SUTURE) IMPLANT
TRAY PACEMAKER INSERTION (PACKS) ×2 IMPLANT

## 2022-04-19 NOTE — Discharge Instructions (Signed)
Resume Plavix 04/20/2022.  Patient may remove outer bandage and shower on 04/22/2022.  Leave Steri-Strips on.

## 2022-04-20 ENCOUNTER — Encounter: Payer: Self-pay | Admitting: Cardiology

## 2022-05-05 ENCOUNTER — Other Ambulatory Visit: Payer: Self-pay | Admitting: Family Medicine

## 2022-05-05 ENCOUNTER — Ambulatory Visit
Admission: RE | Admit: 2022-05-05 | Discharge: 2022-05-05 | Disposition: A | Discharge status: Home / Self Care | Payer: Medicare Other | Source: Ambulatory Visit | Attending: Family Medicine | Admitting: Family Medicine

## 2022-05-05 DIAGNOSIS — Z1231 Encounter for screening mammogram for malignant neoplasm of breast: Secondary | ICD-10-CM

## 2022-05-05 DIAGNOSIS — I10 Essential (primary) hypertension: Secondary | ICD-10-CM

## 2022-05-18 ENCOUNTER — Ambulatory Visit (INDEPENDENT_AMBULATORY_CARE_PROVIDER_SITE_OTHER): Payer: Medicare Other

## 2022-05-18 ENCOUNTER — Ambulatory Visit (INDEPENDENT_AMBULATORY_CARE_PROVIDER_SITE_OTHER): Payer: Medicare Other | Admitting: Nurse Practitioner

## 2022-05-18 ENCOUNTER — Encounter (INDEPENDENT_AMBULATORY_CARE_PROVIDER_SITE_OTHER): Payer: Self-pay | Admitting: Nurse Practitioner

## 2022-05-18 VITALS — BP 152/73 | HR 69 | Resp 16 | Wt 129.4 lb

## 2022-05-18 DIAGNOSIS — I10 Essential (primary) hypertension: Secondary | ICD-10-CM

## 2022-05-18 DIAGNOSIS — Z87891 Personal history of nicotine dependence: Secondary | ICD-10-CM | POA: Diagnosis not present

## 2022-05-18 DIAGNOSIS — I739 Peripheral vascular disease, unspecified: Secondary | ICD-10-CM

## 2022-05-18 DIAGNOSIS — I70223 Atherosclerosis of native arteries of extremities with rest pain, bilateral legs: Secondary | ICD-10-CM | POA: Diagnosis not present

## 2022-05-18 DIAGNOSIS — E782 Mixed hyperlipidemia: Secondary | ICD-10-CM | POA: Diagnosis not present

## 2022-05-25 ENCOUNTER — Telehealth: Payer: Self-pay | Admitting: Family Medicine

## 2022-05-25 DIAGNOSIS — J449 Chronic obstructive pulmonary disease, unspecified: Secondary | ICD-10-CM

## 2022-05-25 NOTE — Telephone Encounter (Signed)
Refilled today

## 2022-05-25 NOTE — Telephone Encounter (Signed)
Requested medication (s) are due for refill today: Yes  Requested medication (s) are on the active medication list: Yes  Last refill:  05/03/21  Future visit scheduled: Yes  Notes to clinic:  Prescription has expired.    Requested Prescriptions  Pending Prescriptions Disp Refills   albuterol (VENTOLIN HFA) 108 (90 Base) MCG/ACT inhaler [Pharmacy Med Name: ALBUTEROL SULFATE HFA 108 (90 BASE)] 8.5 g 5    Sig: INHALE 2 PUFFS INTO THE LUNGS EVERY 6 HOURS AS NEEDED FOR WHEEZING OR SHORTNESS OF BREATH     Pulmonology:  Beta Agonists 2 Failed - 05/25/2022 10:28 AM      Failed - Last BP in normal range    BP Readings from Last 1 Encounters:  05/18/22 (!) 152/73         Passed - Last Heart Rate in normal range    Pulse Readings from Last 1 Encounters:  05/18/22 69         Passed - Valid encounter within last 12 months    Recent Outpatient Visits           2 months ago Encounter for screening mammogram for malignant neoplasm of breast   [place], DO   6 months ago Essential hypertension, benign   [facility] Teodora Medici, DO   10 months ago Need for influenza vaccination   [facility] Kathrine Haddock, NP   1 year ago Exposure to COVID-19 virus   [place], DO   1 year ago [employer] preventive visit   [facility] Kathrine Haddock, NP       Future Appointments             In 3 months Delsa Grana, PA-C [facility]

## 2022-05-31 ENCOUNTER — Other Ambulatory Visit: Payer: Self-pay | Admitting: Family Medicine

## 2022-05-31 DIAGNOSIS — I10 Essential (primary) hypertension: Secondary | ICD-10-CM

## 2022-06-05 ENCOUNTER — Encounter (INDEPENDENT_AMBULATORY_CARE_PROVIDER_SITE_OTHER): Payer: Self-pay | Admitting: Nurse Practitioner
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
## 2022-06-16 ENCOUNTER — Other Ambulatory Visit: Payer: Self-pay | Admitting: Internal Medicine

## 2022-06-16 ENCOUNTER — Other Ambulatory Visit: Payer: Self-pay | Admitting: Family Medicine

## 2022-06-16 DIAGNOSIS — E7849 Other hyperlipidemia: Secondary | ICD-10-CM

## 2022-06-16 DIAGNOSIS — I10 Essential (primary) hypertension: Secondary | ICD-10-CM

## 2022-06-16 NOTE — Telephone Encounter (Signed)
Unable to refill per protocol, request is too soon. Last RF 05/05/22 for 90 days.Receipt confirmed by pharmacy (05/05/2022 11:06 AM). Will refuse request.  Requested Prescriptions  Pending Prescriptions Disp Refills  . amLODipine (NORVASC) 10 MG tablet [Pharmacy Med Name: AMLODIPINE BESYLATE 10 MG TAB] 90 tablet 0    Sig: TAKE 1 TABLET BY MOUTH ONCE DAILY     Cardiovascular: Calcium Channel Blockers 2 Failed - 06/16/2022 10:01 AM      Failed - Last BP in normal range    BP Readings from Last 1 Encounters:  05/18/22 (!) 152/73         Passed - Last Heart Rate in normal range    Pulse Readings from Last 1 Encounters:  05/18/22 69         Passed - Valid encounter within last 6 months    Recent Outpatient Visits          3 months ago Encounter for screening mammogram for malignant neoplasm of breast   [place], DO   7 months ago Essential hypertension, benign   [facility] Teodora Medici, DO   11 months ago Need for influenza vaccination   [facility] Kathrine Haddock, NP   1 year ago Exposure to COVID-19 virus   [place], DO   1 year ago Welcome to [employer] preventive visit   [place], NP      Future Appointments            In 5 days  [facility], [place]   In 2 months Delsa Grana, PA-C [facility], [facility]

## 2022-06-20 ENCOUNTER — Other Ambulatory Visit: Payer: Self-pay | Admitting: Family Medicine

## 2022-06-21 ENCOUNTER — Ambulatory Visit (INDEPENDENT_AMBULATORY_CARE_PROVIDER_SITE_OTHER): Payer: Medicare Other

## 2022-06-21 DIAGNOSIS — Z0000 Encounter for general adult medical examination without abnormal findings: Secondary | ICD-10-CM | POA: Diagnosis not present
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
## 2022-06-21 NOTE — Telephone Encounter (Signed)
Requested Prescriptions  Pending Prescriptions Disp Refills  . clopidogrel (PLAVIX) 75 MG tablet [Pharmacy Med Name: CLOPIDOGREL BISULFATE 75 MG TAB] 90 tablet 0    Sig: TAKE 1 TABLET BY MOUTH ONCE DAILY     Hematology: Antiplatelets - clopidogrel Failed - 06/20/2022  1:33 PM      Failed - Cr in normal range and within 360 days    Creat  Date Value Ref Range Status  03/07/2022 1.25 (H) 0.50 - 1.05 mg/dL Final   Creatinine, Urine  Date Value Ref Range Status  03/30/2017 176 20 - 320 mg/dL Final         Passed - HCT in normal range and within 180 days    HCT  Date Value Ref Range Status  01/03/2022 44.2 35.0 - 45.0 % Final   Hematocrit  Date Value Ref Range Status  09/17/2015 47.1 (H) 34.0 - 46.6 % Final         Passed - HGB in normal range and within 180 days    Hemoglobin  Date Value Ref Range Status  01/03/2022 14.6 11.7 - 15.5 g/dL Final  09/17/2015 16.1 (H) 11.1 - 15.9 g/dL Final         Passed - PLT in normal range and within 180 days    Platelets  Date Value Ref Range Status  01/03/2022 174 140 - 400 Thousand/uL Final  09/17/2015 167 150 - 379 x10E3/uL Final         Passed - Valid encounter within last 6 months    Recent Outpatient Visits          3 months ago Encounter for screening mammogram for malignant neoplasm of breast   [place], DO   7 months ago Essential hypertension, benign   [facility] Teodora Medici, DO   11 months ago Need for influenza vaccination   [facility] Kathrine Haddock, NP   1 year ago Exposure to COVID-19 virus   [place], DO   1 year ago Welcome to [employer] preventive visit   [facility] Kathrine Haddock, NP      Future Appointments            In 2 months Delsa Grana, PA-C [facility], [facility]

## 2022-07-11 ENCOUNTER — Other Ambulatory Visit: Payer: Self-pay | Admitting: Family Medicine

## 2022-07-11 ENCOUNTER — Other Ambulatory Visit: Payer: Self-pay | Admitting: Physician Assistant

## 2022-07-11 DIAGNOSIS — F319 Bipolar disorder, unspecified: Secondary | ICD-10-CM

## 2022-07-12 NOTE — Telephone Encounter (Signed)
Called pt spouse no answer left vm to return my call

## 2022-07-12 NOTE — Telephone Encounter (Signed)
Called both numbers on file. No answer. Unable to leave vm for mobile number.

## 2022-07-12 NOTE — Telephone Encounter (Signed)
Requested medication (s) are due for refill today:   Provider to review  Requested medication (s) are on the active medication list:   Yes   Future visit scheduled:   Yes with Delsa Grana   Last ordered: 03/07/2022 #60, 0 refills  Returned because it's a non delegated refill.      Requested Prescriptions  Pending Prescriptions Disp Refills   lithium carbonate 300 MG capsule [Pharmacy Med Name: LITHIUM CARBONATE 300 MG CAP] 60 capsule 0    Sig: TAKE 1 CAPSULE BY MOUTH TWICE DAILY WITHA MEAL     Not Delegated - Psychiatry:  Antimanics/Bipolar Agents Failed - 07/11/2022 11:21 AM      Failed - This refill cannot be delegated      Failed - TSH in normal range and within 180 days    TSH  Date Value Ref Range Status  12/10/2021 2.58 0.41 - 5.90 Final  05/05/2021 2.020 0.350 - 4.500 uIU/mL Final    Comment:    Performed by a 3rd Generation assay with a functional sensitivity of <=0.01 uIU/mL. Performed at [facility], [address] [place], [place] [address]   03/04/2021 1.16 0.40 - 4.50 mIU/L Final         Failed - eGFR is 30 or above and within 180 days    GFR, Est African American  Date Value Ref Range Status  03/04/2021 61 > OR = 60 mL/min/1.38m Final   GFR, Est Non African American  Date Value Ref Range Status  03/04/2021 52 (L) > OR = 60 mL/min/1.711mFinal   GFR, Estimated  Date Value Ref Range Status  05/06/2021 46 (L) >60 mL/min Final    Comment:    (NOTE) Calculated using the CKD-EPI Creatinine Equation (2021)    eGFR  Date Value Ref Range Status  07/08/2021 54 (L) > OR = 60 mL/min/1.7376minal    Comment:    The eGFR is based on the CKD-EPI 2021 equation. To calculate  the new eGFR from a previous Creatinine or Cystatin C result, go to https://www.kidney.org/professionals/ kdoqi/gfr%5Fcalculator          Failed - CBC within normal limits and completed in the last 6 months    WBC  Date Value Ref Range Status  01/03/2022 7.2 3.8 - 10.8  Thousand/uL Final   RBC  Date Value Ref Range Status  01/03/2022 4.73 3.80 - 5.10 Million/uL Final   Hemoglobin  Date Value Ref Range Status  01/03/2022 14.6 11.7 - 15.5 g/dL Final  09/17/2015 16.1 (H) 11.1 - 15.9 g/dL Final   HCT  Date Value Ref Range Status  01/03/2022 44.2 35.0 - 45.0 % Final   Hematocrit  Date Value Ref Range Status  09/17/2015 47.1 (H) 34.0 - 46.6 % Final   MCHC  Date Value Ref Range Status  01/03/2022 33.0 32.0 - 36.0 g/dL Final   [facility] Value Ref Range Status  01/03/2022 30.9 27.0 - 33.0 pg Final   MCV  Date Value Ref Range Status  01/03/2022 93.4 80.0 - 100.0 fL Final  09/17/2015 91 79 - 97 fL Final   No results found for: "PLTCOUNTKUC", "LABPLAT", "POCPLA" RDW  Date Value Ref Range Status  01/03/2022 12.0 11.0 - 15.0 % Final  09/17/2015 13.7 12.3 - 15.4 % Final         Failed - BMP within normal limits in the last 6 months    Glucose  Date Value Ref Range Status  12/10/2021 87  Final  Glucose, Bld  Date Value Ref Range Status  03/07/2022 126 (H) 65 - 99 mg/dL Final    Comment:    .            Fasting reference interval . For someone without known diabetes, a glucose value >125 mg/dL indicates that they may have diabetes and this should be confirmed with a follow-up test. .    Glucose-Capillary  Date Value Ref Range Status  04/07/2020 94 70 - 99 mg/dL Final    Comment:    Glucose reference range applies only to samples taken after fasting for at least 8 hours.   Calcium  Date Value Ref Range Status  03/07/2022 9.8 8.6 - 10.4 mg/dL Final   Sodium  Date Value Ref Range Status  03/07/2022 137 135 - 146 mmol/L Final  12/10/2021 154 (A) 137 - 147 Final   Potassium  Date Value Ref Range Status  03/07/2022 4.6 3.5 - 5.3 mmol/L Final   Chloride  Date Value Ref Range Status  03/07/2022 108 98 - 110 mmol/L Final   BUN  Date Value Ref Range Status  03/07/2022 14 7 - 25 mg/dL Final  12/10/2021 17 4 - 21 Final    Creat  Date Value Ref Range Status  03/07/2022 1.25 (H) 0.50 - 1.05 mg/dL Final   Creatinine, Urine  Date Value Ref Range Status  03/30/2017 176 20 - 320 mg/dL Final   CO2  Date Value Ref Range Status  03/07/2022 20 20 - 32 mmol/L Final   Bicarbonate  Date Value Ref Range Status  04/01/2020 32.0 (H) 20.0 - 28.0 mmol/L Final   GFR, Est African American  Date Value Ref Range Status  03/04/2021 61 > OR = 60 mL/min/1.86m Final   eGFR  Date Value Ref Range Status  07/08/2021 54 (L) > OR = 60 mL/min/1.757mFinal    Comment:    The eGFR is based on the CKD-EPI 2021 equation. To calculate  the new eGFR from a previous Creatinine or Cystatin C result, go to https://www.kidney.org/professionals/ kdoqi/gfr%5Fcalculator    GFR, Est Non African American  Date Value Ref Range Status  03/04/2021 52 (L) > OR = 60 mL/min/1.7335minal   GFR, Estimated  Date Value Ref Range Status  05/06/2021 46 (L) >60 mL/min Final    Comment:    (NOTE) Calculated using the CKD-EPI Creatinine Equation (2021)          Passed - Lithium Level in normal range and within 180 days    Lithium Lvl  Date Value Ref Range Status  03/07/2022 0.8 0.6 - 1.2 mmol/L Final         Passed - Patient is not pregnant      Passed - Valid encounter within last 6 months    Recent Outpatient Visits           4 months ago Encounter for screening mammogram for malignant neoplasm of [place]   [facility] [place]   8 months ago Essential hypertension, benign   [facility]   1 year ago Need for influenza vaccination   [facility]   1 year ago Exposure to COVID-19 virus   [facility]   1 year ago [facility]   [facility]       Future Appointments  In 1 month Delsa Grana, PA-C  [facility], [place]   In 11 months  [facility], [facility]

## 2022-07-13 NOTE — Telephone Encounter (Signed)
Patient and husband returned call- advised Rx needs to be filled by provider at [facility]- they will contact for RF.

## 2022-07-25 ENCOUNTER — Encounter (INDEPENDENT_AMBULATORY_CARE_PROVIDER_SITE_OTHER): Payer: Self-pay

## 2022-08-08 ENCOUNTER — Other Ambulatory Visit: Payer: Self-pay | Admitting: Internal Medicine

## 2022-08-08 DIAGNOSIS — I10 Essential (primary) hypertension: Secondary | ICD-10-CM

## 2022-08-09 NOTE — Telephone Encounter (Signed)
Requested Prescriptions  Pending Prescriptions Disp Refills   amLODipine (NORVASC) 10 MG tablet [Pharmacy Med Name: AMLODIPINE BESYLATE 10 MG TAB] 90 tablet 0    Sig: TAKE 1 TABLET BY MOUTH ONCE DAILY     Cardiovascular: Calcium Channel Blockers 2 Failed - 08/08/2022  9:10 AM      Failed - Last BP in normal range    BP Readings from Last 1 Encounters:  05/18/22 (!) 152/73         Passed - Last Heart Rate in normal range    Pulse Readings from Last 1 Encounters:  05/18/22 69         Passed - Valid encounter within last 6 months    Recent Outpatient Visits           5 months ago Encounter for screening mammogram for malignant neoplasm of breast   [place], DO   9 months ago Essential hypertension, benign   [place], DO   1 year ago Need for influenza vaccination   [facility] Kathrine Haddock, NP   1 year ago Exposure to COVID-19 virus   [place], DO   1 year ago Welcome to [employer] preventive visit   [facility] Kathrine Haddock, NP       Future Appointments             In 4 weeks Delsa Grana, PA-C [facility], [place]   In 10 months  [facility], [facility]

## 2022-08-26 ENCOUNTER — Other Ambulatory Visit: Payer: Self-pay | Admitting: Family Medicine

## 2022-08-26 DIAGNOSIS — I10 Essential (primary) hypertension: Secondary | ICD-10-CM

## 2022-08-26 NOTE — Telephone Encounter (Signed)
Future visit in 1 week .  Requested Prescriptions  Pending Prescriptions Disp Refills   losartan (COZAAR) 50 MG tablet [Pharmacy Med Name: LOSARTAN POTASSIUM 50 MG TAB] 90 tablet 0    Sig: TAKE 1 TABLET BY MOUTH ONCE DAILY     Cardiovascular:  Angiotensin Receptor Blockers Failed - 08/26/2022 12:16 PM      Failed - Cr in normal range and within 180 days    Creat  Date Value Ref Range Status  03/07/2022 1.25 (H) 0.50 - 1.05 mg/dL Final   Creatinine, Urine  Date Value Ref Range Status  03/30/2017 176 20 - 320 mg/dL Final         Failed - Last BP in normal range    BP Readings from Last 1 Encounters:  05/18/22 (!) 152/73         Passed - K in normal range and within 180 days    Potassium  Date Value Ref Range Status  03/07/2022 4.6 3.5 - 5.3 mmol/L Final         Passed - Patient is not pregnant      Passed - Valid encounter within last 6 months    Recent Outpatient Visits           5 months ago Encounter for screening mammogram for malignant neoplasm of breast   [place], DO   9 months ago Essential hypertension, benign   [place], DO   1 year ago Need for influenza vaccination   [facility] Kathrine Haddock, NP   1 year ago Exposure to COVID-19 virus   [place], DO   1 year ago Welcome to [employer] preventive visit   [facility] Kathrine Haddock, NP       Future Appointments             In 1 week Delsa Grana, PA-C [facility], [place]   In 10 months  [facility], [facility]

## 2022-09-01 ENCOUNTER — Encounter: Payer: Self-pay | Admitting: Family Medicine

## 2022-09-01 ENCOUNTER — Ambulatory Visit (INDEPENDENT_AMBULATORY_CARE_PROVIDER_SITE_OTHER): Payer: Medicare Other | Admitting: Family Medicine

## 2022-09-01 VITALS — BP 148/72 | HR 100 | Temp 97.9°F | Resp 16 | Ht 65.0 in | Wt 128.8 lb

## 2022-09-01 DIAGNOSIS — R350 Frequency of micturition: Secondary | ICD-10-CM

## 2022-09-01 LAB — POCT URINALYSIS DIPSTICK
Bilirubin, UA: NEGATIVE
Glucose, UA: NEGATIVE
Ketones, UA: NEGATIVE
Nitrite, UA: NEGATIVE
Protein, UA: POSITIVE — AB
Spec Grav, UA: 1.01 (ref 1.010–1.025)
Urobilinogen, UA: 0.2 E.U./dL
pH, UA: 6 (ref 5.0–8.0)

## 2022-09-01 MED ORDER — CEPHALEXIN 500 MG PO CAPS: 500.0000 mg | ORAL_CAPSULE | Freq: Three times a day (TID) | ORAL | 0 refills | Status: AC
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
## 2022-09-01 NOTE — Patient Instructions (Signed)
If her symptoms do not start to improve in the next 3-5 days please let us know right away  If there is any severe worsening of confusion or if she is unable to urinate for more than 24 hours please go to the ER for evaluation

## 2022-09-02 LAB — URINE CULTURE
MICRO NUMBER:: 14285018
SPECIMEN QUALITY:: ADEQUATE

## 2022-09-06 ENCOUNTER — Encounter: Payer: Self-pay | Admitting: Family Medicine

## 2022-09-06 ENCOUNTER — Ambulatory Visit (INDEPENDENT_AMBULATORY_CARE_PROVIDER_SITE_OTHER): Payer: Medicare Other | Admitting: Family Medicine

## 2022-09-06 VITALS — BP 158/74 | HR 82 | Temp 97.5°F | Resp 16 | Ht 65.0 in | Wt 126.5 lb

## 2022-09-06 DIAGNOSIS — S069X9S Unspecified intracranial injury with loss of consciousness of unspecified duration, sequela: Secondary | ICD-10-CM

## 2022-09-06 DIAGNOSIS — I10 Essential (primary) hypertension: Secondary | ICD-10-CM

## 2022-09-06 DIAGNOSIS — J449 Chronic obstructive pulmonary disease, unspecified: Secondary | ICD-10-CM

## 2022-09-06 DIAGNOSIS — Z950 Presence of cardiac pacemaker: Secondary | ICD-10-CM

## 2022-09-06 DIAGNOSIS — R319 Hematuria, unspecified: Secondary | ICD-10-CM

## 2022-09-06 DIAGNOSIS — F3160 Bipolar disorder, current episode mixed, unspecified: Secondary | ICD-10-CM | POA: Diagnosis not present

## 2022-09-06 DIAGNOSIS — N390 Urinary tract infection, site not specified: Secondary | ICD-10-CM

## 2022-09-06 DIAGNOSIS — T3695XA Adverse effect of unspecified systemic antibiotic, initial encounter: Secondary | ICD-10-CM

## 2022-09-06 DIAGNOSIS — Z7902 Long term (current) use of antithrombotics/antiplatelets: Secondary | ICD-10-CM

## 2022-09-06 DIAGNOSIS — G40909 Epilepsy, unspecified, not intractable, without status epilepticus: Secondary | ICD-10-CM

## 2022-09-06 DIAGNOSIS — Z780 Asymptomatic menopausal state: Secondary | ICD-10-CM | POA: Diagnosis not present

## 2022-09-06 DIAGNOSIS — Z23 Encounter for immunization: Secondary | ICD-10-CM

## 2022-09-06 DIAGNOSIS — E782 Mixed hyperlipidemia: Secondary | ICD-10-CM

## 2022-09-06 DIAGNOSIS — Z955 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: Secondary | ICD-10-CM

## 2022-09-06 DIAGNOSIS — R413 Other amnesia: Secondary | ICD-10-CM

## 2022-09-06 DIAGNOSIS — Z5181 Encounter for therapeutic drug level monitoring: Secondary | ICD-10-CM

## 2022-09-06 DIAGNOSIS — B379 Candidiasis, unspecified: Secondary | ICD-10-CM

## 2022-09-06 DIAGNOSIS — I480 Paroxysmal atrial fibrillation: Secondary | ICD-10-CM

## 2022-09-06 MED ORDER — FLUCONAZOLE 150 MG PO TABS: 150.0000 mg | ORAL_TABLET | ORAL | 0 refills | Status: DC | PRN

## 2022-09-06 MED ORDER — LOSARTAN POTASSIUM 100 MG PO TABS: 100.0000 mg | ORAL_TABLET | Freq: Every day | ORAL | 1 refills | Status: DC
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
## 2022-09-07 LAB — CBC WITH DIFFERENTIAL/PLATELET
Absolute Monocytes: 465 cells/uL (ref 200–950)
Basophils Absolute: 130 cells/uL (ref 0–200)
Basophils Relative: 1.4 %
Eosinophils Absolute: 223 cells/uL (ref 15–500)
Eosinophils Relative: 2.4 %
HCT: 41.8 % (ref 35.0–45.0)
Hemoglobin: 14.3 g/dL (ref 11.7–15.5)
Lymphs Abs: 2251 cells/uL (ref 850–3900)
MCH: 31.4 pg (ref 27.0–33.0)
MCHC: 34.2 g/dL (ref 32.0–36.0)
MCV: 91.9 fL (ref 80.0–100.0)
MPV: 9.8 fL (ref 7.5–12.5)
Monocytes Relative: 5 %
Neutro Abs: 6231 cells/uL (ref 1500–7800)
Neutrophils Relative %: 67 %
Platelets: 240 10*3/uL (ref 140–400)
RBC: 4.55 10*6/uL (ref 3.80–5.10)
RDW: 12.4 % (ref 11.0–15.0)
Total Lymphocyte: 24.2 %
WBC: 9.3 10*3/uL (ref 3.8–10.8)

## 2022-09-07 LAB — COMPLETE METABOLIC PANEL WITH GFR
AG Ratio: 1.3 (calc) (ref 1.0–2.5)
ALT: 11 U/L (ref 6–29)
AST: 13 U/L (ref 10–35)
Albumin: 4.5 g/dL (ref 3.6–5.1)
Alkaline phosphatase (APISO): 69 U/L (ref 37–153)
BUN/Creatinine Ratio: 9 (calc) (ref 6–22)
BUN: 10 mg/dL (ref 7–25)
CO2: 21 mmol/L (ref 20–32)
Calcium: 9.8 mg/dL (ref 8.6–10.4)
Chloride: 107 mmol/L (ref 98–110)
Creat: 1.15 mg/dL — ABNORMAL HIGH (ref 0.50–1.05)
Globulin: 3.4 g/dL (calc) (ref 1.9–3.7)
Glucose, Bld: 168 mg/dL — ABNORMAL HIGH (ref 65–99)
Potassium: 3.7 mmol/L (ref 3.5–5.3)
Sodium: 138 mmol/L (ref 135–146)
Total Bilirubin: 0.4 mg/dL (ref 0.2–1.2)
Total Protein: 7.9 g/dL (ref 6.1–8.1)
eGFR: 52 mL/min/{1.73_m2} — ABNORMAL LOW (ref >=60)

## 2022-09-07 LAB — LIPID PANEL
Cholesterol: 119 mg/dL (ref <200)
HDL: 56 mg/dL (ref >=50)
LDL Cholesterol (Calc): 36 mg/dL (calc)
Non-HDL Cholesterol (Calc): 63 mg/dL (calc) (ref <130)
Total CHOL/HDL Ratio: 2.1 (calc) (ref <5.0)
Triglycerides: 198 mg/dL — ABNORMAL HIGH (ref <150)

## 2022-09-12 ENCOUNTER — Other Ambulatory Visit: Payer: Self-pay | Admitting: Family Medicine

## 2022-09-12 DIAGNOSIS — E7849 Other hyperlipidemia: Secondary | ICD-10-CM

## 2022-09-20 ENCOUNTER — Encounter: Payer: Self-pay | Admitting: Nurse Practitioner

## 2022-09-20 ENCOUNTER — Telehealth: Payer: Self-pay | Admitting: Emergency Medicine

## 2022-09-20 ENCOUNTER — Ambulatory Visit: Payer: Self-pay | Admitting: *Deleted

## 2022-09-20 ENCOUNTER — Other Ambulatory Visit: Payer: Self-pay

## 2022-09-20 ENCOUNTER — Ambulatory Visit (INDEPENDENT_AMBULATORY_CARE_PROVIDER_SITE_OTHER): Payer: Medicare Other | Admitting: Nurse Practitioner

## 2022-09-20 VITALS — BP 120/78 | HR 89 | Temp 97.8°F | Resp 18 | Ht 65.0 in | Wt 121.1 lb

## 2022-09-20 DIAGNOSIS — R300 Dysuria: Secondary | ICD-10-CM | POA: Diagnosis not present

## 2022-09-20 DIAGNOSIS — R413 Other amnesia: Secondary | ICD-10-CM | POA: Diagnosis not present

## 2022-09-20 LAB — POCT URINALYSIS DIPSTICK
Bilirubin, UA: NEGATIVE
Glucose, UA: NEGATIVE
Ketones, UA: NEGATIVE
Nitrite, UA: NEGATIVE
Protein, UA: POSITIVE — AB
Spec Grav, UA: 1.02 (ref 1.010–1.025)
Urobilinogen, UA: 0.2 E.U./dL
pH, UA: 7 (ref 5.0–8.0)

## 2022-09-20 MED ORDER — CEPHALEXIN 500 MG PO CAPS: 500.0000 mg | ORAL_CAPSULE | Freq: Two times a day (BID) | ORAL | 0 refills | Status: AC

## 2022-09-20 NOTE — Progress Notes (Signed)
BP 120/78   Pulse 89   Temp 97.8 F (36.6 C) (Oral)   Resp 18   Ht '5\' 5"'$  (1.651 m)   Wt 121 lb 1.6 oz (54.9 kg)   SpO2 96%   BMI 20.15 kg/m    Subjective:    Patient ID: Cynthia [name], female    DOB: 1956/06/09, 66 y.o.   MRN: [id number]  HPI: Cynthia [name] is a 66 y.o. female  Chief Complaint  Patient presents with   Urinary Tract Infection   Altered Mental Status    Leaving doors unlocked, dressing in the middle of night, not her usual self   UTI:  patient reports over the last few days she has had some dysuria and frequency. She denies any fever.  She denies any back pain.  Urine dip was positive for leukocytes,protein and blood. Will send urine for culture.  Start patient on keflex.    Memory impairment:  patient's husband reports patients memory has gotten worse and she is not acting like herself. He says he noticed the decline starting around Thanksgiving.  He reports she is leaving doors unlocked and getting dressed in the middle of the night.  She has a neurologist Dr. Melrose Nakayama last seen on 06/15/2022. According to his note patient memory loss was stable with memory evaluation last done on 04/05/2022 was 23/30. Recommend patient's husband calls Dr. Melrose Nakayama and schedules appointment regarding worsening memory.     09/20/2022    1:44 PM 07/08/2021   10:27 AM 03/04/2021   10:27 AM  MMSE - Mini Mental State Exam  Orientation to time '2 4 4  '$ Orientation to Place '3 4 4  '$ Registration '3 3 3  '$ Attention/ Calculation '3 1 4  '$ Recall '3 1 2  '$ Language- name 2 objects '2 2 2  '$ Language- repeat '1 1 1  '$ Language- follow 3 step command '3 3 3  '$ Language- read & follow direction '1 1 1  '$ Write a sentence 0 1 1  Copy design 0 0 0  Total score '21 21 25     '$ She recently saw PCP Delsa Grana PA on 09/01/2022 for uti and 09/06/2022 for routine follow up. According to her note on 09/06/2022 : Almyra Free - [facility] PA-C - she is seeing psych and meds have been adjusted and labs done - our chart is not  accurate -request and review records from both neurology and psychiatry. We have not received records from [facility] at this time.  There are inconsistencies in her medication list from our list to her neurologist. Patient needs to bring medications to next appointment so we can update medication list.   Relevant past medical, surgical, family and social history reviewed and updated as indicated. Interim medical history since our last visit reviewed. Allergies and medications reviewed and updated.  Review of Systems  Constitutional: Negative for fever or weight change.  Respiratory: Negative for cough and shortness of breath.   Cardiovascular: Negative for chest pain or palpitations.  Gastrointestinal: Negative for abdominal pain, no bowel changes.  GU: positive for dysuria Musculoskeletal: Negative for gait problem or joint swelling.  Skin: Negative for rash.  Neurological: Negative for dizziness or headache.  No other specific complaints in a complete review of systems (except as listed in HPI above).      Objective:    BP 120/78   Pulse 89   Temp 97.8 F (36.6 C) (Oral)   Resp 18   Ht '5\' 5"'$  (1.651 m)   Wt  121 lb 1.6 oz (54.9 kg)   SpO2 96%   BMI 20.15 kg/m   Wt Readings from Last 3 Encounters:  09/20/22 121 lb 1.6 oz (54.9 kg)  09/06/22 126 lb 8 oz (57.4 kg)  09/01/22 128 lb 12.8 oz (58.4 kg)    Physical Exam  Constitutional: Patient appears well-developed and well-nourished.  No distress.  HEENT: head atraumatic, normocephalic, pupils equal and reactive to light, neck supple Cardiovascular: Normal rate, regular rhythm and normal heart sounds.  No murmur heard. No BLE edema. Pulmonary/Chest: Effort normal and breath sounds normal. No respiratory distress. Abdominal: Soft.  There is no tenderness. Neuro; equal grip, normal gait, no arm drift Psychiatric: Patient has a normal mood and affect. behavior is normal. Judgment and thought content normal.   Results for  orders placed or performed in visit on 09/20/22  POCT urinalysis dipstick  Result Value Ref Range   Color, UA gold    Clarity, UA cloudy    Glucose, UA Negative Negative   Bilirubin, UA negative    Ketones, UA negative    Spec Grav, UA 1.020 1.010 - 1.025   Blood, UA large    pH, UA 7.0 5.0 - 8.0   Protein, UA Positive (A) Negative   Urobilinogen, UA 0.2 0.2 or 1.0 E.U./dL   Nitrite, UA negative    Leukocytes, UA Large (3+) (A) Negative   Appearance murky    Odor none       Assessment & Plan:   Problem List Items Addressed This Visit       Other   Memory impairment    Treating for uti, recommend follow up with Dr. Melrose Nakayama, regarding worsening memory.       Other Visit Diagnoses     Dysuria    -  Primary   start keflex, push fluids, urine sent for culture   Relevant Medications   cephALEXin (KEFLEX) 500 MG capsule   Other Relevant Orders   POCT urinalysis dipstick (Completed)   Urine Culture        Follow up plan: Return for appointment all ready scheduled.

## 2022-09-20 NOTE — Assessment & Plan Note (Signed)
Treating for uti, recommend follow up with Dr. Melrose Nakayama, regarding worsening memory.

## 2022-09-20 NOTE — Telephone Encounter (Signed)
  Chief Complaint: AMS Symptoms: Husband calling, pt had UTI,  treated, "Seems like same symptoms and she is not acting right." States forgetful,not focusing,"Keep pulling at her pants down there and putting her hands down there." Spoke to pt, states "Hard to pee at times."  Frequency: Seen 12/12, few days later Pertinent Negatives: Patient denies  Disposition: '[]'$ ED /'[]'$ Urgent Care (no appt availability in office) / '[x]'$ Appointment(In office/virtual)/ '[]'$  [facility]/ '[]'$ Home Care/ '[]'$ Refused Recommended Disposition /'[]'$ [facility]/ '[]'$  Follow-up with PCP Additional Notes: Appt secured for today at 120. Care advise provided, verbalizes understanding. Reason for Disposition  [1] Worsening confusion AND [2] gradual onset (days to weeks)  Answer Assessment - Initial Assessment Questions 1. MAIN CONCERN OR SYMPTOM:  "What is your main concern right now?" "What questions do you have?" "What's the main symptom you're worried about?" (e.g., confusion, memory loss)     "Acting lost" more forgetful, no focus 2. ONSET:  "When did the symptom start (or worsen)?" (minutes, hours, days, weeks)     Worsening since 08/27/22 3. BETTER-SAME-WORSE: "Are you (the patient) getting better, staying the same, or getting worse compared to the day you (they) were diagnosed or most recent hospital discharge ?"     Worse 4. DIAGNOSIS: "Was the dementia diagnosed by a doctor?" If Yes, ask: "When?" (e.g., days, months, years ago)     No 5. MEDICINES: "Has there been any change in medicines recently?" (e.g., narcotics, antihistamines, benzodiazepines, etc.)     Unsure 6. OTHER SYMPTOMS: "Are there any other symptoms?" (e.g., fever, cough, pain, falling)      7. SUPPORT: Document living circumstances and support (e.g., family, nursing home)     Husband  Protocols used: Dementia Symptoms and Questions-A-AH

## 2022-09-20 NOTE — Telephone Encounter (Signed)
Patient has appointment coming up at 1:20 today. Almyra Free advised patient to go to ER. Spoke to patient husband he refused ER visit and stated she is leaving doors open at night, she is getting up in middle of night dressing herself. She is not responding to him like she normally do. He stated she may have another UTI and wanted to see what he an do to have her tested for dementia.

## 2022-09-21 LAB — URINE CULTURE
MICRO NUMBER:: 14357175
SPECIMEN QUALITY:: ADEQUATE

## 2022-09-22 ENCOUNTER — Telehealth: Payer: Self-pay | Admitting: Family Medicine

## 2022-09-22 NOTE — Telephone Encounter (Signed)
Copied from [place] [id number]. Topic: General - Other >> Sep 22, 2022  8:38 AM Charlotte Sanes J wrote: Reason for CRM: Pts Husband needs to speak with Kristeen Miss about information [employer] needs faxed to them about the pt ( to get care for pt while he is at work due to pt wondering around during the day / please advise / he goes to work at [employer] and would need to speak with her before then

## 2022-09-22 NOTE — Telephone Encounter (Signed)
Patient called insurance he would like for you to place referral about getting in home help like an aid to help take care of her while he is at work.  She is having memory issues and wondering, he states referral needs to come from PCP.  He is in process of getting in touch with Dr. Melrose Nakayama as well

## 2022-09-22 NOTE — Telephone Encounter (Signed)
He states he called his ins and they told him PCP needed to place referral?

## 2022-09-22 NOTE — Telephone Encounter (Signed)
So call and tell him to call his ins and ask for PCA assesment

## 2022-09-23 NOTE — Telephone Encounter (Signed)
Husband notified to contact ins and ask for PCA asses.

## 2022-09-28 ENCOUNTER — Other Ambulatory Visit: Payer: Self-pay

## 2022-09-29 ENCOUNTER — Encounter: Payer: Self-pay | Admitting: Family Medicine

## 2022-10-06 ENCOUNTER — Ambulatory Visit: Payer: Self-pay

## 2022-10-06 NOTE — Telephone Encounter (Signed)
Pt's husband called to give update on pt's status: Pt is in "Depressed mode- tried to leave house and lock herself out"- see psychiatrist Dr. Melrose Nakayama tomorrow- [employer]- provided numbers for pt's spouse to call to make arrangements for sitter. Spouse stated around Thanksgiving, pt  had tied  a cord around throat. Husband called Dr. Melrose Nakayama (psychiatrist). Trazodone prescribed. Not working initially then dose was adjusted and is working better now.   Not taking care of herself as usual. Husband having to remind her. Has been talking about divorce and deaths in family and spouse felt that had a lot to do with her mood.  Last night had bottle of med in pocket and admitted to  taking 2 pills.Called Dr Melrose Nakayama and appt scheduled for tomorrow. Husband having to lock up pills.  Starting to eat again.  Spirits normal but does not trust that she will not get into her clothes and try and leave house. Takes them off and them back. Smoking more.  [employer] provided husband some numbers to call and get sitters 4 hours during 4 hours at night. Pt sees Dr Melrose Nakayama tomorrow at [phone].Spouse added if need to talk to him he is available until 1330 then at work.   Reason for Disposition . NON-URGENT call redirected to PCP's office because it is open  Protocols used: No Contact or Duplicate Contact Call-A-AH

## 2022-10-07 DIAGNOSIS — R251 Tremor, unspecified: Secondary | ICD-10-CM | POA: Diagnosis not present

## 2022-10-07 DIAGNOSIS — R413 Other amnesia: Secondary | ICD-10-CM | POA: Diagnosis not present

## 2022-10-07 DIAGNOSIS — R262 Difficulty in walking, not elsewhere classified: Secondary | ICD-10-CM | POA: Diagnosis not present

## 2022-10-07 DIAGNOSIS — R569 Unspecified convulsions: Secondary | ICD-10-CM | POA: Diagnosis not present

## 2022-10-07 DIAGNOSIS — Z87820 Personal history of traumatic brain injury: Secondary | ICD-10-CM | POA: Diagnosis not present

## 2022-10-14 ENCOUNTER — Other Ambulatory Visit (HOSPITAL_COMMUNITY): Payer: Self-pay

## 2022-10-14 NOTE — Telephone Encounter (Signed)
Spoke to patient spouse, he stated she has already seen Dr.Potter and under control. He was just giving [place] an update. He mentioned he will also be going to Social service to see if he can get any type of help for some nurses to take care of wife.

## 2022-10-15 ENCOUNTER — Other Ambulatory Visit: Payer: Self-pay | Admitting: Family Medicine

## 2022-11-01 ENCOUNTER — Ambulatory Visit: Payer: 59 | Admitting: Nurse Practitioner

## 2022-12-01 ENCOUNTER — Telehealth: Payer: Self-pay

## 2022-12-01 NOTE — Telephone Encounter (Signed)
Called pt spouse to let him know paperwork is ready to be picked up. Mon-Fri from 8am-11:30am or 1:30pm-4pm.

## 2022-12-06 ENCOUNTER — Ambulatory Visit (INDEPENDENT_AMBULATORY_CARE_PROVIDER_SITE_OTHER): Payer: 59 | Admitting: Family Medicine

## 2022-12-06 ENCOUNTER — Encounter: Payer: Self-pay | Admitting: Family Medicine

## 2022-12-06 VITALS — BP 158/76 | HR 61 | Temp 97.9°F | Resp 16 | Ht 65.0 in | Wt 120.8 lb

## 2022-12-06 DIAGNOSIS — R739 Hyperglycemia, unspecified: Secondary | ICD-10-CM

## 2022-12-06 DIAGNOSIS — J449 Chronic obstructive pulmonary disease, unspecified: Secondary | ICD-10-CM

## 2022-12-06 DIAGNOSIS — S069X9S Unspecified intracranial injury with loss of consciousness of unspecified duration, sequela: Secondary | ICD-10-CM

## 2022-12-06 DIAGNOSIS — I10 Essential (primary) hypertension: Secondary | ICD-10-CM

## 2022-12-06 DIAGNOSIS — Z9181 History of falling: Secondary | ICD-10-CM | POA: Diagnosis not present

## 2022-12-06 DIAGNOSIS — R29898 Other symptoms and signs involving the musculoskeletal system: Secondary | ICD-10-CM | POA: Diagnosis not present

## 2022-12-06 DIAGNOSIS — Z789 Other specified health status: Secondary | ICD-10-CM

## 2022-12-06 DIAGNOSIS — I739 Peripheral vascular disease, unspecified: Secondary | ICD-10-CM

## 2022-12-06 DIAGNOSIS — E46 Unspecified protein-calorie malnutrition: Secondary | ICD-10-CM | POA: Diagnosis not present

## 2022-12-06 DIAGNOSIS — G40909 Epilepsy, unspecified, not intractable, without status epilepticus: Secondary | ICD-10-CM

## 2022-12-06 DIAGNOSIS — F3160 Bipolar disorder, current episode mixed, unspecified: Secondary | ICD-10-CM

## 2022-12-06 DIAGNOSIS — I25119 Atherosclerotic heart disease of native coronary artery with unspecified angina pectoris: Secondary | ICD-10-CM

## 2022-12-06 DIAGNOSIS — Z7409 Other reduced mobility: Secondary | ICD-10-CM | POA: Diagnosis not present

## 2022-12-06 DIAGNOSIS — N1831 Chronic kidney disease, stage 3a: Secondary | ICD-10-CM

## 2022-12-06 DIAGNOSIS — G629 Polyneuropathy, unspecified: Secondary | ICD-10-CM | POA: Diagnosis not present

## 2022-12-06 DIAGNOSIS — R269 Unspecified abnormalities of gait and mobility: Secondary | ICD-10-CM

## 2022-12-06 DIAGNOSIS — I480 Paroxysmal atrial fibrillation: Secondary | ICD-10-CM

## 2022-12-06 DIAGNOSIS — S069X9D Unspecified intracranial injury with loss of consciousness of unspecified duration, subsequent encounter: Secondary | ICD-10-CM

## 2022-12-06 DIAGNOSIS — J4550 Severe persistent asthma, uncomplicated: Secondary | ICD-10-CM | POA: Insufficient documentation

## 2022-12-06 DIAGNOSIS — K7030 Alcoholic cirrhosis of liver without ascites: Secondary | ICD-10-CM

## 2022-12-06 NOTE — Assessment & Plan Note (Signed)
Monitoring LFTs, previously managed by specialist

## 2022-12-06 NOTE — Assessment & Plan Note (Signed)
RRR today. Cardiology does not recommend anticoagulation due to fall risk. Continue current regimen and risk modification

## 2022-12-06 NOTE — Assessment & Plan Note (Signed)
She is following with vascular, on statin She may not be taking the Plavix currently  -last vascular office visit note states to continue antiplatelet with both Plavix and aspirin in the note Patient is having some generalized weakness, gait and balance issues but no reported pain/claudication and prior monitoring of ABIs was stable They do have vascular follow-up soon

## 2022-12-06 NOTE — Assessment & Plan Note (Signed)
HH PT/OT to eval - other referrals after that as needed

## 2022-12-06 NOTE — Assessment & Plan Note (Signed)
Currently asx, on statin, ASA, continue to work on smoking cessation

## 2022-12-06 NOTE — Assessment & Plan Note (Signed)
Doing well with current management per pulmonology She has a slight cough today but no increased sputum production, chest pain, wheeze or shortness of breath

## 2022-12-06 NOTE — Assessment & Plan Note (Signed)
Unable to ambulate independently and she is usually left home alone for 8+ hours a day She did have PT eval about a year ago but reports some worsening in her strength function and ability to care for herself or get around.  In the exam room today she could not even walk independently a few steps on her own. Have put in a home health order to see if they can come to her to do assessment, exercises and treatment plan
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
## 2022-12-06 NOTE — Assessment & Plan Note (Addendum)
BP elevated today, husband states she is taking both meds She had 6 cups of coffee right before coming here and smoked Not at goal with recheck Will need to try and monitor at home or recheck in office with less stimulants prior to OV BP Readings from Last 3 Encounters:  12/06/22 (!) 158/76  09/20/22 120/78  09/06/22 (!) 158/74  For now continue losartan 100 mg and amlodipine 10 mg daily

## 2022-12-06 NOTE — Assessment & Plan Note (Signed)
Symptom has been ongoing for many years but has recently worsened (over 9+ months?)  Despite recent vascular follow-up and management -weaknesses not from PAD Home health PT OT ordered May need neuro/Ortho/PM&R follow-up?
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
## 2022-12-06 NOTE — Assessment & Plan Note (Signed)
Per neurology 
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
## 2022-12-06 NOTE — Assessment & Plan Note (Signed)
Does seen neurology and is managed on gabapentin

## 2022-12-06 NOTE — Assessment & Plan Note (Signed)
PHQ 9 and GAD 7 screening today very high, but pt is pleasant, managed by psychiatry with multiple meds, they see them regularly

## 2022-12-06 NOTE — Assessment & Plan Note (Signed)
Wt Readings from Last 5 Encounters:  12/06/22 120 lb 12.8 oz (54.8 kg)  09/20/22 121 lb 1.6 oz (54.9 kg)  09/06/22 126 lb 8 oz (57.4 kg)  09/01/22 128 lb 12.8 oz (58.4 kg)  05/18/22 129 lb 6.4 oz (58.7 kg)   BMI Readings from Last 5 Encounters:  12/06/22 20.10 kg/m  09/20/22 20.15 kg/m  09/06/22 21.05 kg/m  09/01/22 21.43 kg/m  05/18/22 21.53 kg/m   She's lost weight over the past year, but stable over the last 3 months

## 2022-12-06 NOTE — Assessment & Plan Note (Signed)
Worsening ability to care for herself - Functional status screening - pt is completely dependent - cannot dress herself any more - needing different kinds of shoes HH assessment, req eval for PCA and CAP program per insurance

## 2022-12-06 NOTE — Assessment & Plan Note (Signed)
On inhalers per pulm - coughing today but no wheeze or SOB Continue to follow with specialists and smoking cessation encouraged

## 2022-12-07 LAB — BASIC METABOLIC PANEL WITH GFR
BUN/Creatinine Ratio: 12 (calc) (ref 6–22)
BUN: 15 mg/dL (ref 7–25)
CO2: 21 mmol/L (ref 20–32)
Calcium: 10.5 mg/dL — ABNORMAL HIGH (ref 8.6–10.4)
Chloride: 108 mmol/L (ref 98–110)
Creat: 1.3 mg/dL — ABNORMAL HIGH (ref 0.50–1.05)
Glucose, Bld: 97 mg/dL (ref 65–99)
Potassium: 5.1 mmol/L (ref 3.5–5.3)
Sodium: 139 mmol/L (ref 135–146)
eGFR: 45 mL/min/{1.73_m2} — ABNORMAL LOW (ref >=60)

## 2022-12-07 LAB — HEMOGLOBIN A1C
Hgb A1c MFr Bld: 5.1 % of total Hgb (ref <5.7)
Mean Plasma Glucose: 100 mg/dL
eAG (mmol/L): 5.5 mmol/L

## 2022-12-08 NOTE — Addendum Note (Signed)
Addended by: Delsa Grana on: 12/08/2022 08:53 PM   Modules accepted: Orders

## 2022-12-09 ENCOUNTER — Telehealth: Payer: Self-pay | Admitting: Family Medicine

## 2022-12-09 DIAGNOSIS — E43 Unspecified severe protein-calorie malnutrition: Secondary | ICD-10-CM | POA: Diagnosis not present

## 2022-12-09 DIAGNOSIS — H9192 Unspecified hearing loss, left ear: Secondary | ICD-10-CM | POA: Diagnosis not present

## 2022-12-09 DIAGNOSIS — I10 Essential (primary) hypertension: Secondary | ICD-10-CM | POA: Diagnosis not present

## 2022-12-09 DIAGNOSIS — G8929 Other chronic pain: Secondary | ICD-10-CM | POA: Diagnosis not present

## 2022-12-09 DIAGNOSIS — Z7951 Long term (current) use of inhaled steroids: Secondary | ICD-10-CM | POA: Diagnosis not present

## 2022-12-09 DIAGNOSIS — M25559 Pain in unspecified hip: Secondary | ICD-10-CM | POA: Diagnosis not present

## 2022-12-09 DIAGNOSIS — Z87891 Personal history of nicotine dependence: Secondary | ICD-10-CM | POA: Diagnosis not present

## 2022-12-09 DIAGNOSIS — Z7902 Long term (current) use of antithrombotics/antiplatelets: Secondary | ICD-10-CM | POA: Diagnosis not present

## 2022-12-09 DIAGNOSIS — J4489 Other specified chronic obstructive pulmonary disease: Secondary | ICD-10-CM | POA: Diagnosis not present

## 2022-12-09 DIAGNOSIS — S069X9S Unspecified intracranial injury with loss of consciousness of unspecified duration, sequela: Secondary | ICD-10-CM | POA: Diagnosis not present

## 2022-12-09 DIAGNOSIS — I739 Peripheral vascular disease, unspecified: Secondary | ICD-10-CM | POA: Diagnosis not present

## 2022-12-09 DIAGNOSIS — E785 Hyperlipidemia, unspecified: Secondary | ICD-10-CM | POA: Diagnosis not present

## 2022-12-09 DIAGNOSIS — R29898 Other symptoms and signs involving the musculoskeletal system: Secondary | ICD-10-CM | POA: Diagnosis not present

## 2022-12-09 DIAGNOSIS — I480 Paroxysmal atrial fibrillation: Secondary | ICD-10-CM | POA: Diagnosis not present

## 2022-12-09 DIAGNOSIS — Z950 Presence of cardiac pacemaker: Secondary | ICD-10-CM | POA: Diagnosis not present

## 2022-12-09 DIAGNOSIS — G629 Polyneuropathy, unspecified: Secondary | ICD-10-CM | POA: Diagnosis not present

## 2022-12-09 DIAGNOSIS — J4550 Severe persistent asthma, uncomplicated: Secondary | ICD-10-CM | POA: Diagnosis not present

## 2022-12-09 DIAGNOSIS — I2510 Atherosclerotic heart disease of native coronary artery without angina pectoris: Secondary | ICD-10-CM | POA: Diagnosis not present

## 2022-12-09 DIAGNOSIS — Z7982 Long term (current) use of aspirin: Secondary | ICD-10-CM | POA: Diagnosis not present

## 2022-12-09 NOTE — Telephone Encounter (Signed)
Home Health Verbal Orders - Caller/Agency: [facility] Number: [phone] Requesting PT Frequency: 1x7

## 2022-12-12 ENCOUNTER — Telehealth: Payer: Self-pay | Admitting: Family Medicine

## 2022-12-12 DIAGNOSIS — Z7902 Long term (current) use of antithrombotics/antiplatelets: Secondary | ICD-10-CM | POA: Diagnosis not present

## 2022-12-12 DIAGNOSIS — G629 Polyneuropathy, unspecified: Secondary | ICD-10-CM | POA: Diagnosis not present

## 2022-12-12 DIAGNOSIS — E43 Unspecified severe protein-calorie malnutrition: Secondary | ICD-10-CM | POA: Diagnosis not present

## 2022-12-12 DIAGNOSIS — I739 Peripheral vascular disease, unspecified: Secondary | ICD-10-CM | POA: Diagnosis not present

## 2022-12-12 DIAGNOSIS — J4550 Severe persistent asthma, uncomplicated: Secondary | ICD-10-CM | POA: Diagnosis not present

## 2022-12-12 DIAGNOSIS — G8929 Other chronic pain: Secondary | ICD-10-CM | POA: Diagnosis not present

## 2022-12-12 DIAGNOSIS — Z7982 Long term (current) use of aspirin: Secondary | ICD-10-CM | POA: Diagnosis not present

## 2022-12-12 DIAGNOSIS — M25559 Pain in unspecified hip: Secondary | ICD-10-CM | POA: Diagnosis not present

## 2022-12-12 DIAGNOSIS — I2510 Atherosclerotic heart disease of native coronary artery without angina pectoris: Secondary | ICD-10-CM | POA: Diagnosis not present

## 2022-12-12 DIAGNOSIS — S069X9S Unspecified intracranial injury with loss of consciousness of unspecified duration, sequela: Secondary | ICD-10-CM | POA: Diagnosis not present

## 2022-12-12 DIAGNOSIS — Z7951 Long term (current) use of inhaled steroids: Secondary | ICD-10-CM | POA: Diagnosis not present

## 2022-12-12 DIAGNOSIS — J4489 Other specified chronic obstructive pulmonary disease: Secondary | ICD-10-CM | POA: Diagnosis not present

## 2022-12-12 DIAGNOSIS — E785 Hyperlipidemia, unspecified: Secondary | ICD-10-CM | POA: Diagnosis not present

## 2022-12-12 DIAGNOSIS — H9192 Unspecified hearing loss, left ear: Secondary | ICD-10-CM | POA: Diagnosis not present

## 2022-12-12 DIAGNOSIS — Z950 Presence of cardiac pacemaker: Secondary | ICD-10-CM | POA: Diagnosis not present

## 2022-12-12 DIAGNOSIS — I10 Essential (primary) hypertension: Secondary | ICD-10-CM | POA: Diagnosis not present

## 2022-12-12 DIAGNOSIS — I480 Paroxysmal atrial fibrillation: Secondary | ICD-10-CM | POA: Diagnosis not present

## 2022-12-12 DIAGNOSIS — Z87891 Personal history of nicotine dependence: Secondary | ICD-10-CM | POA: Diagnosis not present

## 2022-12-12 DIAGNOSIS — R29898 Other symptoms and signs involving the musculoskeletal system: Secondary | ICD-10-CM | POA: Diagnosis not present

## 2022-12-12 NOTE — Telephone Encounter (Signed)
VO given.

## 2022-12-12 NOTE — Telephone Encounter (Signed)
Home Health Verbal Orders - Caller/Agency: Col[place] [place] [facility] Number: [phone]  Requesting OT Frequency: 1 x wk for 4 weeks, every other week for 2 visits

## 2022-12-13 DIAGNOSIS — Z7902 Long term (current) use of antithrombotics/antiplatelets: Secondary | ICD-10-CM | POA: Diagnosis not present

## 2022-12-13 DIAGNOSIS — Z950 Presence of cardiac pacemaker: Secondary | ICD-10-CM | POA: Diagnosis not present

## 2022-12-13 DIAGNOSIS — H9192 Unspecified hearing loss, left ear: Secondary | ICD-10-CM | POA: Diagnosis not present

## 2022-12-13 DIAGNOSIS — I495 Sick sinus syndrome: Secondary | ICD-10-CM | POA: Diagnosis not present

## 2022-12-13 DIAGNOSIS — Z7951 Long term (current) use of inhaled steroids: Secondary | ICD-10-CM | POA: Diagnosis not present

## 2022-12-13 DIAGNOSIS — Z87891 Personal history of nicotine dependence: Secondary | ICD-10-CM | POA: Diagnosis not present

## 2022-12-13 DIAGNOSIS — Z7982 Long term (current) use of aspirin: Secondary | ICD-10-CM | POA: Diagnosis not present

## 2022-12-13 DIAGNOSIS — R29898 Other symptoms and signs involving the musculoskeletal system: Secondary | ICD-10-CM | POA: Diagnosis not present

## 2022-12-13 DIAGNOSIS — G629 Polyneuropathy, unspecified: Secondary | ICD-10-CM | POA: Diagnosis not present

## 2022-12-13 DIAGNOSIS — I480 Paroxysmal atrial fibrillation: Secondary | ICD-10-CM | POA: Diagnosis not present

## 2022-12-13 DIAGNOSIS — S069X9S Unspecified intracranial injury with loss of consciousness of unspecified duration, sequela: Secondary | ICD-10-CM | POA: Diagnosis not present

## 2022-12-13 DIAGNOSIS — E43 Unspecified severe protein-calorie malnutrition: Secondary | ICD-10-CM | POA: Diagnosis not present

## 2022-12-13 DIAGNOSIS — J4489 Other specified chronic obstructive pulmonary disease: Secondary | ICD-10-CM | POA: Diagnosis not present

## 2022-12-13 DIAGNOSIS — M25559 Pain in unspecified hip: Secondary | ICD-10-CM | POA: Diagnosis not present

## 2022-12-13 DIAGNOSIS — E785 Hyperlipidemia, unspecified: Secondary | ICD-10-CM | POA: Diagnosis not present

## 2022-12-13 DIAGNOSIS — I10 Essential (primary) hypertension: Secondary | ICD-10-CM | POA: Diagnosis not present

## 2022-12-13 DIAGNOSIS — G8929 Other chronic pain: Secondary | ICD-10-CM | POA: Diagnosis not present

## 2022-12-13 DIAGNOSIS — I739 Peripheral vascular disease, unspecified: Secondary | ICD-10-CM | POA: Diagnosis not present

## 2022-12-13 DIAGNOSIS — I2510 Atherosclerotic heart disease of native coronary artery without angina pectoris: Secondary | ICD-10-CM | POA: Diagnosis not present

## 2022-12-13 DIAGNOSIS — J4550 Severe persistent asthma, uncomplicated: Secondary | ICD-10-CM | POA: Diagnosis not present

## 2022-12-14 DIAGNOSIS — G251 Drug-induced tremor: Secondary | ICD-10-CM | POA: Diagnosis not present

## 2022-12-14 DIAGNOSIS — I2510 Atherosclerotic heart disease of native coronary artery without angina pectoris: Secondary | ICD-10-CM | POA: Diagnosis not present

## 2022-12-14 DIAGNOSIS — E782 Mixed hyperlipidemia: Secondary | ICD-10-CM | POA: Diagnosis not present

## 2022-12-14 DIAGNOSIS — R262 Difficulty in walking, not elsewhere classified: Secondary | ICD-10-CM | POA: Diagnosis not present

## 2022-12-14 DIAGNOSIS — I10 Essential (primary) hypertension: Secondary | ICD-10-CM | POA: Diagnosis not present

## 2022-12-14 DIAGNOSIS — Z955 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: Secondary | ICD-10-CM | POA: Diagnosis not present

## 2022-12-14 DIAGNOSIS — I495 Sick sinus syndrome: Secondary | ICD-10-CM | POA: Diagnosis not present

## 2022-12-14 DIAGNOSIS — I38 Endocarditis, valve unspecified: Secondary | ICD-10-CM | POA: Diagnosis not present

## 2022-12-14 DIAGNOSIS — J9690 Respiratory failure, unspecified, unspecified whether with hypoxia or hypercapnia: Secondary | ICD-10-CM | POA: Diagnosis not present

## 2022-12-14 DIAGNOSIS — Z950 Presence of cardiac pacemaker: Secondary | ICD-10-CM | POA: Diagnosis not present

## 2022-12-14 DIAGNOSIS — I739 Peripheral vascular disease, unspecified: Secondary | ICD-10-CM | POA: Diagnosis not present

## 2022-12-14 DIAGNOSIS — R413 Other amnesia: Secondary | ICD-10-CM | POA: Diagnosis not present

## 2022-12-14 DIAGNOSIS — J439 Emphysema, unspecified: Secondary | ICD-10-CM | POA: Diagnosis not present

## 2022-12-14 DIAGNOSIS — Z8673 Personal history of transient ischemic attack (TIA), and cerebral infarction without residual deficits: Secondary | ICD-10-CM | POA: Diagnosis not present

## 2022-12-14 DIAGNOSIS — Z720 Tobacco use: Secondary | ICD-10-CM | POA: Diagnosis not present

## 2022-12-15 ENCOUNTER — Emergency Department: Payer: 59

## 2022-12-15 ENCOUNTER — Inpatient Hospital Stay
Admission: EM | Admit: 2022-12-15 | Discharge: 2022-12-26 | DRG: 917 | Disposition: A | Discharge status: Home / Self Care | Payer: 59 | Attending: Internal Medicine | Admitting: Internal Medicine

## 2022-12-15 ENCOUNTER — Other Ambulatory Visit: Payer: Self-pay

## 2022-12-15 DIAGNOSIS — J9602 Acute respiratory failure with hypercapnia: Secondary | ICD-10-CM | POA: Diagnosis present

## 2022-12-15 DIAGNOSIS — R9431 Abnormal electrocardiogram [ECG] [EKG]: Secondary | ICD-10-CM | POA: Diagnosis not present

## 2022-12-15 DIAGNOSIS — G5601 Carpal tunnel syndrome, right upper limb: Secondary | ICD-10-CM | POA: Diagnosis not present

## 2022-12-15 DIAGNOSIS — R1310 Dysphagia, unspecified: Secondary | ICD-10-CM | POA: Diagnosis not present

## 2022-12-15 DIAGNOSIS — K7460 Unspecified cirrhosis of liver: Secondary | ICD-10-CM | POA: Diagnosis present

## 2022-12-15 DIAGNOSIS — Z8673 Personal history of transient ischemic attack (TIA), and cerebral infarction without residual deficits: Secondary | ICD-10-CM

## 2022-12-15 DIAGNOSIS — Z813 Family history of other psychoactive substance abuse and dependence: Secondary | ICD-10-CM

## 2022-12-15 DIAGNOSIS — F419 Anxiety disorder, unspecified: Secondary | ICD-10-CM | POA: Diagnosis present

## 2022-12-15 DIAGNOSIS — R404 Transient alteration of awareness: Secondary | ICD-10-CM | POA: Diagnosis not present

## 2022-12-15 DIAGNOSIS — R4182 Altered mental status, unspecified: Secondary | ICD-10-CM | POA: Diagnosis present

## 2022-12-15 DIAGNOSIS — R0602 Shortness of breath: Secondary | ICD-10-CM | POA: Diagnosis not present

## 2022-12-15 DIAGNOSIS — I480 Paroxysmal atrial fibrillation: Secondary | ICD-10-CM | POA: Diagnosis present

## 2022-12-15 DIAGNOSIS — R5383 Other fatigue: Secondary | ICD-10-CM | POA: Diagnosis not present

## 2022-12-15 DIAGNOSIS — Z818 Family history of other mental and behavioral disorders: Secondary | ICD-10-CM

## 2022-12-15 DIAGNOSIS — G928 Other toxic encephalopathy: Secondary | ICD-10-CM

## 2022-12-15 DIAGNOSIS — N170 Acute kidney failure with tubular necrosis: Secondary | ICD-10-CM | POA: Diagnosis not present

## 2022-12-15 DIAGNOSIS — N179 Acute kidney failure, unspecified: Secondary | ICD-10-CM | POA: Diagnosis not present

## 2022-12-15 DIAGNOSIS — G8929 Other chronic pain: Secondary | ICD-10-CM | POA: Diagnosis not present

## 2022-12-15 DIAGNOSIS — J449 Chronic obstructive pulmonary disease, unspecified: Secondary | ICD-10-CM | POA: Diagnosis not present

## 2022-12-15 DIAGNOSIS — F319 Bipolar disorder, unspecified: Secondary | ICD-10-CM | POA: Diagnosis present

## 2022-12-15 DIAGNOSIS — J9601 Acute respiratory failure with hypoxia: Secondary | ICD-10-CM | POA: Diagnosis present

## 2022-12-15 DIAGNOSIS — Z8249 Family history of ischemic heart disease and other diseases of the circulatory system: Secondary | ICD-10-CM

## 2022-12-15 DIAGNOSIS — T56892A Toxic effect of other metals, intentional self-harm, initial encounter: Secondary | ICD-10-CM | POA: Diagnosis not present

## 2022-12-15 DIAGNOSIS — E876 Hypokalemia: Secondary | ICD-10-CM | POA: Diagnosis not present

## 2022-12-15 DIAGNOSIS — I739 Peripheral vascular disease, unspecified: Secondary | ICD-10-CM | POA: Diagnosis present

## 2022-12-15 DIAGNOSIS — B182 Chronic viral hepatitis C: Secondary | ICD-10-CM | POA: Diagnosis present

## 2022-12-15 DIAGNOSIS — I10 Essential (primary) hypertension: Secondary | ICD-10-CM | POA: Diagnosis not present

## 2022-12-15 DIAGNOSIS — T56891A Toxic effect of other metals, accidental (unintentional), initial encounter: Secondary | ICD-10-CM | POA: Diagnosis not present

## 2022-12-15 DIAGNOSIS — Z823 Family history of stroke: Secondary | ICD-10-CM

## 2022-12-15 DIAGNOSIS — Z9851 Tubal ligation status: Secondary | ICD-10-CM

## 2022-12-15 DIAGNOSIS — Z7902 Long term (current) use of antithrombotics/antiplatelets: Secondary | ICD-10-CM

## 2022-12-15 DIAGNOSIS — J9690 Respiratory failure, unspecified, unspecified whether with hypoxia or hypercapnia: Secondary | ICD-10-CM | POA: Diagnosis not present

## 2022-12-15 DIAGNOSIS — F3178 Bipolar disorder, in full remission, most recent episode mixed: Secondary | ICD-10-CM | POA: Diagnosis present

## 2022-12-15 DIAGNOSIS — T43591A Poisoning by other antipsychotics and neuroleptics, accidental (unintentional), initial encounter: Secondary | ICD-10-CM | POA: Diagnosis not present

## 2022-12-15 DIAGNOSIS — E872 Acidosis, unspecified: Secondary | ICD-10-CM | POA: Diagnosis present

## 2022-12-15 DIAGNOSIS — I952 Hypotension due to drugs: Secondary | ICD-10-CM | POA: Diagnosis present

## 2022-12-15 DIAGNOSIS — Z91410 Personal history of adult physical and sexual abuse: Secondary | ICD-10-CM

## 2022-12-15 DIAGNOSIS — Z6821 Body mass index (BMI) 21.0-21.9, adult: Secondary | ICD-10-CM

## 2022-12-15 DIAGNOSIS — Z122 Encounter for screening for malignant neoplasm of respiratory organs: Secondary | ICD-10-CM

## 2022-12-15 DIAGNOSIS — T4275XA Adverse effect of unspecified antiepileptic and sedative-hypnotic drugs, initial encounter: Secondary | ICD-10-CM | POA: Diagnosis present

## 2022-12-15 DIAGNOSIS — I2510 Atherosclerotic heart disease of native coronary artery without angina pectoris: Secondary | ICD-10-CM | POA: Diagnosis present

## 2022-12-15 DIAGNOSIS — I6782 Cerebral ischemia: Secondary | ICD-10-CM | POA: Diagnosis not present

## 2022-12-15 DIAGNOSIS — T56891S Toxic effect of other metals, accidental (unintentional), sequela: Secondary | ICD-10-CM | POA: Diagnosis not present

## 2022-12-15 DIAGNOSIS — Z4659 Encounter for fitting and adjustment of other gastrointestinal appliance and device: Secondary | ICD-10-CM | POA: Diagnosis not present

## 2022-12-15 DIAGNOSIS — R410 Disorientation, unspecified: Secondary | ICD-10-CM | POA: Diagnosis not present

## 2022-12-15 DIAGNOSIS — Z79899 Other long term (current) drug therapy: Secondary | ICD-10-CM

## 2022-12-15 DIAGNOSIS — R918 Other nonspecific abnormal finding of lung field: Secondary | ICD-10-CM | POA: Diagnosis not present

## 2022-12-15 DIAGNOSIS — F17210 Nicotine dependence, cigarettes, uncomplicated: Secondary | ICD-10-CM

## 2022-12-15 DIAGNOSIS — J690 Pneumonitis due to inhalation of food and vomit: Secondary | ICD-10-CM | POA: Diagnosis present

## 2022-12-15 DIAGNOSIS — I495 Sick sinus syndrome: Secondary | ICD-10-CM | POA: Diagnosis present

## 2022-12-15 DIAGNOSIS — Z87891 Personal history of nicotine dependence: Secondary | ICD-10-CM

## 2022-12-15 DIAGNOSIS — G40909 Epilepsy, unspecified, not intractable, without status epilepticus: Secondary | ICD-10-CM

## 2022-12-15 DIAGNOSIS — Z950 Presence of cardiac pacemaker: Secondary | ICD-10-CM | POA: Diagnosis present

## 2022-12-15 DIAGNOSIS — J90 Pleural effusion, not elsewhere classified: Secondary | ICD-10-CM | POA: Diagnosis not present

## 2022-12-15 DIAGNOSIS — R58 Hemorrhage, not elsewhere classified: Secondary | ICD-10-CM | POA: Diagnosis not present

## 2022-12-15 DIAGNOSIS — E440 Moderate protein-calorie malnutrition: Secondary | ICD-10-CM | POA: Insufficient documentation

## 2022-12-15 DIAGNOSIS — Z885 Allergy status to narcotic agent status: Secondary | ICD-10-CM

## 2022-12-15 DIAGNOSIS — Z5329 Procedure and treatment not carried out because of patient's decision for other reasons: Secondary | ICD-10-CM | POA: Diagnosis not present

## 2022-12-15 DIAGNOSIS — Z7982 Long term (current) use of aspirin: Secondary | ICD-10-CM

## 2022-12-15 LAB — BASIC METABOLIC PANEL
Anion gap: 6 (ref 5–15)
BUN: 26 mg/dL — ABNORMAL HIGH (ref 8–23)
CO2: 24 mmol/L (ref 22–32)
Calcium: 9.3 mg/dL (ref 8.9–10.3)
Chloride: 103 mmol/L (ref 98–111)
Creatinine, Ser: 1.48 mg/dL — ABNORMAL HIGH (ref 0.44–1.00)
GFR, Estimated: 39 mL/min — ABNORMAL LOW (ref >60)
Glucose, Bld: 125 mg/dL — ABNORMAL HIGH (ref 70–99)
Potassium: 4 mmol/L (ref 3.5–5.1)
Sodium: 133 mmol/L — ABNORMAL LOW (ref 135–145)

## 2022-12-15 LAB — CBC WITH DIFFERENTIAL/PLATELET
Abs Immature Granulocytes: 0.02 10*3/uL (ref 0.00–0.07)
Basophils Absolute: 0.1 10*3/uL (ref 0.0–0.1)
Basophils Relative: 1 %
Eosinophils Absolute: 0.2 10*3/uL (ref 0.0–0.5)
Eosinophils Relative: 3 %
HCT: 41.6 % (ref 36.0–46.0)
Hemoglobin: 13 g/dL (ref 12.0–15.0)
Immature Granulocytes: 0 %
Lymphocytes Relative: 25 %
Lymphs Abs: 1.9 10*3/uL (ref 0.7–4.0)
MCH: 31 pg (ref 26.0–34.0)
MCHC: 31.3 g/dL (ref 30.0–36.0)
MCV: 99.3 fL (ref 80.0–100.0)
Monocytes Absolute: 0.3 10*3/uL (ref 0.1–1.0)
Monocytes Relative: 5 %
Neutro Abs: 4.9 10*3/uL (ref 1.7–7.7)
Neutrophils Relative %: 66 %
Platelets: 171 10*3/uL (ref 150–400)
RBC: 4.19 MIL/uL (ref 3.87–5.11)
RDW: 12.9 % (ref 11.5–15.5)
WBC: 7.4 10*3/uL (ref 4.0–10.5)
nRBC: 0 % (ref 0.0–0.2)

## 2022-12-15 LAB — ETHANOL: Alcohol, Ethyl (B): <10 mg/dL (ref <10)

## 2022-12-15 LAB — TROPONIN I (HIGH SENSITIVITY): Troponin I (High Sensitivity): 6 ng/L (ref <18)

## 2022-12-15 MED ORDER — NALOXONE HCL 2 MG/2ML IJ SOSY
0.4000 mg | PREFILLED_SYRINGE | INTRAMUSCULAR | Status: DC | PRN
  Administered 2022-12-15 – 2022-12-16 (×2): 0.4 mg via INTRAVENOUS
  Filled 2022-12-15 (×2): qty 2

## 2022-12-15 MED ORDER — SODIUM CHLORIDE 0.9 % IV BOLUS
1000.0000 mL | Freq: Once | INTRAVENOUS | Status: AC
  Administered 2022-12-15: 1000 mL via INTRAVENOUS

## 2022-12-15 NOTE — ED Triage Notes (Addendum)
BIBEMS, c/o AMS. Husband stated fatigue and increase in tremors started today @7pm . Pt has baseline tremors. Pupils pinpoint. Drowsy but awakes to verbal stimuli. 281ml of NS given by EMS. PMH: bipolar, pacemaker. Neighbor reported Pt took oxycodone @8pm , unsure of dose. BGL: 135

## 2022-12-15 NOTE — ED Provider Notes (Signed)
[facility] Provider Note    Event Date/Time   First MD Initiated Contact with Patient 12/15/22 2129     (approximate)   History   Altered Mental Status   HPI  Cynthia [name] is a 67 y.o. female past medical history significant for hypertension, PAD, paroxysmal atrial fibrillation, bipolar disorder, memory impairment, chronic pain, history of TBI, who presents to the emergency department with altered mental status.  Report from EMS states that her husband stated that she was having worsening fatigue and tremors that started today around 7 PM.  Went to take her to urgent care but it was closed so he drove back home.  He called 911 for worsening altered mental status.  When EMS arrived patient was drowsy with pinpoint pupils but protecting her airway.  A neighbor came out and said that the patient had took oxycodone at 8 PM.  The patient states that she took her home oxycodone dose that was given to her by her husband.  Denies any chest pain.     Physical Exam   Triage Vital Signs: ED Triage Vitals  Enc Vitals Group     BP      Pulse      Resp      Temp      Temp src      SpO2      Weight      Height      Head Circumference      Peak Flow      Pain Score      Pain Loc      Pain Edu?      Excl. in [place]?     Most recent vital signs: Vitals:   12/15/22 2200 12/15/22 2230  BP: (!) 146/55 (!) 160/63  Pulse: (!) 58 64  Resp: 15 10  Temp:    SpO2: 97% 97%    Physical Exam Constitutional:      Appearance: She is well-developed.  HENT:     Mouth/Throat:     Mouth: Mucous membranes are moist.  Eyes:     Comments: Pinpoint pupils  Cardiovascular:     Rate and Rhythm: Regular rhythm.  Pulmonary:     Breath sounds: No rales.  Abdominal:     General: There is no distension.     Tenderness: There is no abdominal tenderness.  Musculoskeletal:        General: Normal range of motion.     Cervical back: Normal range of motion and neck supple.      Right lower leg: No edema.     Left lower leg: No edema.  Skin:    General: Skin is warm.     Capillary Refill: Capillary refill takes less than 2 seconds.  Neurological:     Mental Status: She is alert. She is disoriented.     Comments: Following simple commands.  No pronator drift.  5/5 strength bilateral lower extremities.  +2 DP pulses.  Sensation intact to upper and lower extremities.     IMPRESSION / MDM / ASSESSMENT AND PLAN / ED COURSE  I reviewed the triage vital signs and the nursing notes.  Differential diagnosis including opioid overdose, intracranial hemorrhage, CVA, electrolyte abnormalities, elevated ammonia, hypercarbia.    EKG  I, Nathaniel Man, the attending physician, personally viewed and interpreted this ECG.   Rate: Normal  Rhythm: Normal sinus  Axis: Normal  Intervals: Normal  ST&T Change: None  No tachycardic or bradycardic dysrhythmias while  on cardiac telemetry.  RADIOLOGY I independently reviewed imaging, my interpretation of imaging: CT scan of the head without signs of intracranial hemorrhage.  LABS (all labs ordered are listed, but only abnormal results are displayed) Labs interpreted as -    Labs Reviewed  BASIC METABOLIC PANEL - Abnormal; Notable for the following components:      Result Value   Sodium 133 (*)    Glucose, Bld 125 (*)    BUN 26 (*)    Creatinine, Ser 1.48 (*)    GFR, Estimated 39 (*)    All other components within normal limits  CBC WITH DIFFERENTIAL/PLATELET  ETHANOL  URINALYSIS, W/ REFLEX TO CULTURE (INFECTION SUSPECTED)  URINE DRUG SCREEN, QUALITATIVE ([facility] ONLY)  AMMONIA  BLOOD GAS, VENOUS  TROPONIN I (HIGH SENSITIVITY)  TROPONIN I (HIGH SENSITIVITY)    TREATMENT  IV Narcan, IV fluid  MDM    Patient was given IV Narcan without any improvement of her altered mental status or pinpoint pupils.  Patient without any signs of meningitis.  Has a nonfocal neurologic exam, have a low suspicion for an acute CVA  and is outside of the window for TNK.  Possibly polypharmacy.  On chart review has a history of tremors.  Lab work without significant electrolyte abnormalities.  Initial troponin negative.  On reevaluation patient continues to have mental status -  Is somnolent.   Consulted hospitalist for admission for altered mental status.  No family members currently available to provide any other further history.  Husband arrived earlier for couple of minutes and told the nurse that she just had worsening tremors and did not know that she took any oxycodone.  Increased dose of gabapentin recently.  Will attempt a second dose of IV Narcan.  Added on ammonia level and VBG to evaluate for hypercarbia.   PROCEDURES:  Critical Care performed: No  Procedures  Patient's presentation is most consistent with acute presentation with potential threat to life or bodily function.   MEDICATIONS ORDERED IN ED: Medications  naloxone [facility]) injection 0.4 mg (0.4 mg Intravenous Given 12/15/22 2151)  sodium chloride 0.9 % bolus 1,000 mL (has no administration in time range)  sodium chloride 0.9 % bolus 1,000 mL (1,000 mLs Intravenous New Bag/Given 12/15/22 2200)    FINAL CLINICAL IMPRESSION(S) / ED DIAGNOSES   Final diagnoses:  Altered mental status, unspecified altered mental status type     Rx / DC Orders   ED Discharge Orders     None        Note:  This document was prepared using Dragon voice recognition software and may include unintentional dictation errors.   Nathaniel Man, MD 12/16/22 [phone]

## 2022-12-16 ENCOUNTER — Emergency Department: Payer: 59

## 2022-12-16 ENCOUNTER — Telehealth: Payer: Self-pay | Admitting: Family Medicine

## 2022-12-16 ENCOUNTER — Inpatient Hospital Stay
Admit: 2022-12-16 | Discharge: 2022-12-16 | Disposition: A | Discharge status: Home / Self Care | Payer: 59 | Attending: Pulmonary Disease | Admitting: Pulmonary Disease

## 2022-12-16 DIAGNOSIS — G928 Other toxic encephalopathy: Secondary | ICD-10-CM | POA: Diagnosis present

## 2022-12-16 DIAGNOSIS — J9601 Acute respiratory failure with hypoxia: Secondary | ICD-10-CM | POA: Diagnosis present

## 2022-12-16 DIAGNOSIS — J9602 Acute respiratory failure with hypercapnia: Secondary | ICD-10-CM | POA: Diagnosis present

## 2022-12-16 DIAGNOSIS — Z4659 Encounter for fitting and adjustment of other gastrointestinal appliance and device: Secondary | ICD-10-CM | POA: Diagnosis not present

## 2022-12-16 DIAGNOSIS — N170 Acute kidney failure with tubular necrosis: Secondary | ICD-10-CM | POA: Diagnosis present

## 2022-12-16 DIAGNOSIS — J449 Chronic obstructive pulmonary disease, unspecified: Secondary | ICD-10-CM | POA: Diagnosis present

## 2022-12-16 DIAGNOSIS — F319 Bipolar disorder, unspecified: Secondary | ICD-10-CM | POA: Diagnosis present

## 2022-12-16 DIAGNOSIS — I739 Peripheral vascular disease, unspecified: Secondary | ICD-10-CM | POA: Diagnosis present

## 2022-12-16 DIAGNOSIS — T56891A Toxic effect of other metals, accidental (unintentional), initial encounter: Secondary | ICD-10-CM | POA: Diagnosis not present

## 2022-12-16 DIAGNOSIS — T4275XA Adverse effect of unspecified antiepileptic and sedative-hypnotic drugs, initial encounter: Secondary | ICD-10-CM | POA: Diagnosis present

## 2022-12-16 DIAGNOSIS — R410 Disorientation, unspecified: Secondary | ICD-10-CM | POA: Diagnosis not present

## 2022-12-16 DIAGNOSIS — T56892A Toxic effect of other metals, intentional self-harm, initial encounter: Secondary | ICD-10-CM | POA: Diagnosis not present

## 2022-12-16 DIAGNOSIS — E872 Acidosis, unspecified: Secondary | ICD-10-CM | POA: Diagnosis present

## 2022-12-16 DIAGNOSIS — I495 Sick sinus syndrome: Secondary | ICD-10-CM | POA: Diagnosis present

## 2022-12-16 DIAGNOSIS — G5601 Carpal tunnel syndrome, right upper limb: Secondary | ICD-10-CM | POA: Diagnosis present

## 2022-12-16 DIAGNOSIS — B182 Chronic viral hepatitis C: Secondary | ICD-10-CM | POA: Diagnosis present

## 2022-12-16 DIAGNOSIS — T43591A Poisoning by other antipsychotics and neuroleptics, accidental (unintentional), initial encounter: Secondary | ICD-10-CM | POA: Diagnosis present

## 2022-12-16 DIAGNOSIS — N179 Acute kidney failure, unspecified: Secondary | ICD-10-CM | POA: Diagnosis not present

## 2022-12-16 DIAGNOSIS — J9690 Respiratory failure, unspecified, unspecified whether with hypoxia or hypercapnia: Secondary | ICD-10-CM | POA: Diagnosis not present

## 2022-12-16 DIAGNOSIS — K7460 Unspecified cirrhosis of liver: Secondary | ICD-10-CM | POA: Diagnosis present

## 2022-12-16 DIAGNOSIS — E876 Hypokalemia: Secondary | ICD-10-CM | POA: Diagnosis present

## 2022-12-16 DIAGNOSIS — I952 Hypotension due to drugs: Secondary | ICD-10-CM | POA: Diagnosis present

## 2022-12-16 DIAGNOSIS — R4182 Altered mental status, unspecified: Secondary | ICD-10-CM | POA: Diagnosis present

## 2022-12-16 DIAGNOSIS — I10 Essential (primary) hypertension: Secondary | ICD-10-CM | POA: Diagnosis present

## 2022-12-16 DIAGNOSIS — R0602 Shortness of breath: Secondary | ICD-10-CM | POA: Diagnosis not present

## 2022-12-16 DIAGNOSIS — R918 Other nonspecific abnormal finding of lung field: Secondary | ICD-10-CM | POA: Diagnosis not present

## 2022-12-16 DIAGNOSIS — E440 Moderate protein-calorie malnutrition: Secondary | ICD-10-CM | POA: Diagnosis present

## 2022-12-16 DIAGNOSIS — F419 Anxiety disorder, unspecified: Secondary | ICD-10-CM | POA: Diagnosis present

## 2022-12-16 DIAGNOSIS — F17210 Nicotine dependence, cigarettes, uncomplicated: Secondary | ICD-10-CM | POA: Diagnosis present

## 2022-12-16 DIAGNOSIS — I480 Paroxysmal atrial fibrillation: Secondary | ICD-10-CM | POA: Diagnosis present

## 2022-12-16 DIAGNOSIS — T56891S Toxic effect of other metals, accidental (unintentional), sequela: Secondary | ICD-10-CM | POA: Diagnosis not present

## 2022-12-16 DIAGNOSIS — I2510 Atherosclerotic heart disease of native coronary artery without angina pectoris: Secondary | ICD-10-CM | POA: Diagnosis present

## 2022-12-16 DIAGNOSIS — G8929 Other chronic pain: Secondary | ICD-10-CM | POA: Diagnosis present

## 2022-12-16 DIAGNOSIS — J90 Pleural effusion, not elsewhere classified: Secondary | ICD-10-CM | POA: Diagnosis not present

## 2022-12-16 DIAGNOSIS — J690 Pneumonitis due to inhalation of food and vomit: Secondary | ICD-10-CM | POA: Diagnosis present

## 2022-12-16 HISTORY — DX: Other toxic encephalopathy: G92.8

## 2022-12-16 HISTORY — DX: Toxic effect of other metals, accidental (unintentional), initial encounter: T56.891A

## 2022-12-16 LAB — CBC
HCT: 41.4 % (ref 36.0–46.0)
Hemoglobin: 12.5 g/dL (ref 12.0–15.0)
MCH: 31.2 pg (ref 26.0–34.0)
MCHC: 30.2 g/dL (ref 30.0–36.0)
MCV: 103.2 fL — ABNORMAL HIGH (ref 80.0–100.0)
Platelets: 177 10*3/uL (ref 150–400)
RBC: 4.01 MIL/uL (ref 3.87–5.11)
RDW: 13 % (ref 11.5–15.5)
WBC: 10.9 10*3/uL — ABNORMAL HIGH (ref 4.0–10.5)
nRBC: 0 % (ref 0.0–0.2)

## 2022-12-16 LAB — BASIC METABOLIC PANEL
Anion gap: 3 — ABNORMAL LOW (ref 5–15)
Anion gap: 5 (ref 5–15)
BUN: 19 mg/dL (ref 8–23)
BUN: 16 mg/dL (ref 8–23)
CO2: 22 mmol/L (ref 22–32)
CO2: 17 mmol/L — ABNORMAL LOW (ref 22–32)
Calcium: 9.1 mg/dL (ref 8.9–10.3)
Calcium: 8.8 mg/dL — ABNORMAL LOW (ref 8.9–10.3)
Chloride: 115 mmol/L — ABNORMAL HIGH (ref 98–111)
Chloride: 117 mmol/L — ABNORMAL HIGH (ref 98–111)
Creatinine, Ser: 1.32 mg/dL — ABNORMAL HIGH (ref 0.44–1.00)
Creatinine, Ser: 1.15 mg/dL — ABNORMAL HIGH (ref 0.44–1.00)
GFR, Estimated: 44 mL/min — ABNORMAL LOW (ref >60)
GFR, Estimated: 52 mL/min — ABNORMAL LOW (ref >60)
Glucose, Bld: 107 mg/dL — ABNORMAL HIGH (ref 70–99)
Glucose, Bld: 89 mg/dL (ref 70–99)
Potassium: 4.3 mmol/L (ref 3.5–5.1)
Potassium: 4.3 mmol/L (ref 3.5–5.1)
Sodium: 138 mmol/L (ref 135–145)
Sodium: 139 mmol/L (ref 135–145)

## 2022-12-16 LAB — URINALYSIS, W/ REFLEX TO CULTURE (INFECTION SUSPECTED)
Bacteria, UA: NONE SEEN
Bilirubin Urine: NEGATIVE
Glucose, UA: NEGATIVE mg/dL
Ketones, ur: NEGATIVE mg/dL
Nitrite: NEGATIVE
Protein, ur: 100 mg/dL — AB
RBC / HPF: >50 RBC/hpf (ref 0–5)
Specific Gravity, Urine: 1.015 (ref 1.005–1.030)
Squamous Epithelial / HPF: NONE SEEN /HPF (ref 0–5)
WBC, UA: >50 WBC/hpf (ref 0–5)
pH: 8.5 — ABNORMAL HIGH (ref 5.0–8.0)

## 2022-12-16 LAB — AMMONIA: Ammonia: 26 umol/L (ref 9–35)

## 2022-12-16 LAB — BLOOD GAS, VENOUS
Acid-base deficit: 0.6 mmol/L (ref 0.0–2.0)
Bicarbonate: 28.1 mmol/L — ABNORMAL HIGH (ref 20.0–28.0)
O2 Saturation: 42.1 %
Patient temperature: 37
pCO2, Ven: 64 mmHg — ABNORMAL HIGH (ref 44–60)
pH, Ven: 7.25 (ref 7.25–7.43)
pO2, Ven: <31 mmHg — CL (ref 32–45)

## 2022-12-16 LAB — ECHOCARDIOGRAM COMPLETE
AR max vel: 3.07 cm2
AV Area VTI: 3.36 cm2
AV Area mean vel: 2.85 cm2
AV Mean grad: 4 mmHg
AV Peak grad: 7 mmHg
Ao pk vel: 1.32 m/s
Area-P 1/2: 2.13 cm2
Height: 65 in
MV VTI: 3.06 cm2
S' Lateral: 2.5 cm
Weight: 2077.62 oz

## 2022-12-16 LAB — LITHIUM LEVEL
Lithium Lvl: 1.94 mmol/L (ref 0.60–1.20)
Lithium Lvl: 1.77 mmol/L (ref 0.60–1.20)

## 2022-12-16 LAB — BLOOD GAS, ARTERIAL
Acid-base deficit: 5.8 mmol/L — ABNORMAL HIGH (ref 0.0–2.0)
Bicarbonate: 20.1 mmol/L (ref 20.0–28.0)
FIO2: 40 %
MECHVT: 400 mL
Mechanical Rate: 20
O2 Saturation: 94.2 %
PEEP: 5 cmH2O
Patient temperature: 37
pCO2 arterial: 40 mmHg (ref 32–48)
pH, Arterial: 7.31 — ABNORMAL LOW (ref 7.35–7.45)
pO2, Arterial: 64 mmHg — ABNORMAL LOW (ref 83–108)

## 2022-12-16 LAB — GLUCOSE, CAPILLARY
Glucose-Capillary: 100 mg/dL — ABNORMAL HIGH (ref 70–99)
Glucose-Capillary: 104 mg/dL — ABNORMAL HIGH (ref 70–99)
Glucose-Capillary: 115 mg/dL — ABNORMAL HIGH (ref 70–99)
Glucose-Capillary: 128 mg/dL — ABNORMAL HIGH (ref 70–99)
Glucose-Capillary: 159 mg/dL — ABNORMAL HIGH (ref 70–99)
Glucose-Capillary: 80 mg/dL (ref 70–99)

## 2022-12-16 LAB — URINE DRUG SCREEN, QUALITATIVE (ARMC ONLY)
Amphetamines, Ur Screen: NOT DETECTED
Barbiturates, Ur Screen: NOT DETECTED
Benzodiazepine, Ur Scrn: NOT DETECTED
Cannabinoid 50 Ng, Ur ~~LOC~~: POSITIVE — AB
Cocaine Metabolite,Ur ~~LOC~~: NOT DETECTED
MDMA (Ecstasy)Ur Screen: NOT DETECTED
Methadone Scn, Ur: NOT DETECTED
Opiate, Ur Screen: NOT DETECTED
Phencyclidine (PCP) Ur S: NOT DETECTED
Tricyclic, Ur Screen: POSITIVE — AB

## 2022-12-16 LAB — HIV ANTIBODY (ROUTINE TESTING W REFLEX): HIV Screen 4th Generation wRfx: NONREACTIVE

## 2022-12-16 LAB — BRAIN NATRIURETIC PEPTIDE: B Natriuretic Peptide: 21.3 pg/mL (ref 0.0–100.0)

## 2022-12-16 LAB — MAGNESIUM: Magnesium: 2.1 mg/dL (ref 1.7–2.4)

## 2022-12-16 LAB — TROPONIN I (HIGH SENSITIVITY): Troponin I (High Sensitivity): 5 ng/L (ref <18)

## 2022-12-16 LAB — LACTIC ACID, PLASMA
Lactic Acid, Venous: 2 mmol/L (ref 0.5–1.9)
Lactic Acid, Venous: 1.7 mmol/L (ref 0.5–1.9)

## 2022-12-16 LAB — PHOSPHORUS: Phosphorus: 3.3 mg/dL (ref 2.5–4.6)

## 2022-12-16 MED ORDER — ORAL CARE MOUTH RINSE
15.0000 mL | OROMUCOSAL | Status: DC
  Administered 2022-12-16 – 2022-12-21 (×64): 15 mL via OROMUCOSAL

## 2022-12-16 MED ORDER — FENTANYL BOLUS VIA INFUSION
25.0000 ug | INTRAVENOUS | Status: DC | PRN
  Administered 2022-12-16 – 2022-12-18 (×9): 100 ug via INTRAVENOUS

## 2022-12-16 MED ORDER — MIDAZOLAM HCL 2 MG/2ML IJ SOLN: 1.0000 mg | INTRAMUSCULAR | Status: DC | PRN

## 2022-12-16 MED ORDER — ETOMIDATE 2 MG/ML IV SOLN
20.0000 mg | Freq: Once | INTRAVENOUS | Status: DC
  Filled 2022-12-16: qty 10

## 2022-12-16 MED ORDER — PROPOFOL 1000 MG/100ML IV EMUL
5.0000 ug/kg/min | INTRAVENOUS | Status: DC
  Administered 2022-12-16: 30 ug/kg/min via INTRAVENOUS
  Administered 2022-12-16: 40 ug/kg/min via INTRAVENOUS
  Administered 2022-12-16: 10 ug/kg/min via INTRAVENOUS
  Administered 2022-12-17 – 2022-12-18 (×6): 50 ug/kg/min via INTRAVENOUS
  Filled 2022-12-16 (×9): qty 100

## 2022-12-16 MED ORDER — POLYETHYLENE GLYCOL 3350 17 G PO PACK
17.0000 g | PACK | Freq: Every day | ORAL | Status: DC
  Administered 2022-12-17 – 2022-12-19 (×3): 17 g
  Filled 2022-12-16 (×4): qty 1

## 2022-12-16 MED ORDER — ETOMIDATE 2 MG/ML IV SOLN
INTRAVENOUS | Status: DC | PRN
  Administered 2022-12-16: 20 mg via INTRAVENOUS

## 2022-12-16 MED ORDER — FREE WATER
30.0000 mL | Status: DC
  Administered 2022-12-16 – 2022-12-21 (×26): 30 mL

## 2022-12-16 MED ORDER — ROCURONIUM BROMIDE 50 MG/5ML IV SOLN
INTRAVENOUS | Status: DC | PRN
  Administered 2022-12-16: 100 mg via INTRAVENOUS

## 2022-12-16 MED ORDER — SODIUM CHLORIDE 0.9 % IV SOLN: INTRAVENOUS | Status: DC

## 2022-12-16 MED ORDER — ROCURONIUM BROMIDE 10 MG/ML (PF) SYRINGE
100.0000 mg | PREFILLED_SYRINGE | Freq: Once | INTRAVENOUS | Status: DC
  Filled 2022-12-16: qty 10

## 2022-12-16 MED ORDER — SODIUM CHLORIDE 0.9 % IV SOLN
3.0000 g | Freq: Four times a day (QID) | INTRAVENOUS | Status: DC
  Filled 2022-12-16: qty 8

## 2022-12-16 MED ORDER — ORAL CARE MOUTH RINSE: 15.0000 mL | OROMUCOSAL | Status: DC | PRN

## 2022-12-16 MED ORDER — VITAL AF 1.2 CAL PO LIQD
1000.0000 mL | ORAL | Status: DC
  Administered 2022-12-16 – 2022-12-20 (×4): 1000 mL

## 2022-12-16 MED ORDER — FAMOTIDINE 20 MG PO TABS: 20.0000 mg | ORAL_TABLET | Freq: Two times a day (BID) | ORAL | Status: DC

## 2022-12-16 MED ORDER — SODIUM CHLORIDE 0.9 % IV BOLUS
1000.0000 mL | Freq: Once | INTRAVENOUS | Status: AC
  Administered 2022-12-16: 1000 mL via INTRAVENOUS

## 2022-12-16 MED ORDER — POLYETHYLENE GLYCOL 3350 17 G PO PACK: 17.0000 g | PACK | Freq: Every day | ORAL | Status: DC | PRN

## 2022-12-16 MED ORDER — FENTANYL 2500MCG IN NS 250ML (10MCG/ML) PREMIX INFUSION
100.0000 ug/h | INTRAVENOUS | Status: DC
  Administered 2022-12-16 – 2022-12-18 (×3): 100 ug/h via INTRAVENOUS
  Filled 2022-12-16 (×3): qty 250

## 2022-12-16 MED ORDER — ENOXAPARIN SODIUM 40 MG/0.4ML IJ SOSY
40.0000 mg | PREFILLED_SYRINGE | INTRAMUSCULAR | Status: DC
  Administered 2022-12-16 – 2022-12-25 (×10): 40 mg via SUBCUTANEOUS
  Filled 2022-12-16 (×10): qty 0.4

## 2022-12-16 MED ORDER — NOREPINEPHRINE 4 MG/250ML-% IV SOLN
2.0000 ug/min | INTRAVENOUS | Status: DC
  Administered 2022-12-16: 2 ug/min via INTRAVENOUS
  Administered 2022-12-17: 3 ug/min via INTRAVENOUS
  Administered 2022-12-18: 2 ug/min via INTRAVENOUS
  Filled 2022-12-16 (×3): qty 250

## 2022-12-16 MED ORDER — DOCUSATE SODIUM 50 MG/5ML PO LIQD: 100.0000 mg | Freq: Two times a day (BID) | ORAL | Status: DC | PRN

## 2022-12-16 MED ORDER — FAMOTIDINE 20 MG PO TABS
20.0000 mg | ORAL_TABLET | Freq: Every day | ORAL | Status: DC
  Administered 2022-12-16 – 2022-12-20 (×5): 20 mg
  Filled 2022-12-16 (×5): qty 1

## 2022-12-16 MED ORDER — CHLORHEXIDINE GLUCONATE CLOTH 2 % EX PADS
6.0000 | MEDICATED_PAD | Freq: Every day | CUTANEOUS | Status: DC
  Administered 2022-12-16 – 2022-12-21 (×6): 6 via TOPICAL

## 2022-12-16 MED ORDER — DOCUSATE SODIUM 50 MG/5ML PO LIQD
100.0000 mg | Freq: Two times a day (BID) | ORAL | Status: DC
  Administered 2022-12-16 – 2022-12-19 (×8): 100 mg
  Filled 2022-12-16 (×8): qty 10

## 2022-12-16 MED ORDER — SODIUM CHLORIDE 0.9 % IV SOLN
250.0000 mL | INTRAVENOUS | Status: DC
  Administered 2022-12-17 (×2): 250 mL via INTRAVENOUS

## 2022-12-16 NOTE — ED Notes (Signed)
Pt is covered in urine and feces, cleaned Pts bed and gown with assistance at this time

## 2022-12-16 NOTE — Sedation Documentation (Signed)
BVM placed on Pt

## 2022-12-16 NOTE — Telephone Encounter (Signed)
Copied from [place] [id number]. Topic: General - Other >> Dec 16, 2022  8:03 AM Carrielelia G wrote: Reason for CRM: Soni from [facility] is requesting a diagnosis on patient

## 2022-12-16 NOTE — Telephone Encounter (Signed)
Spoke to Hutton- he was asking if instead of using HTN diagnosis code could they use A Fib instead since its also listed due to billing purpose. Agreed

## 2022-12-16 NOTE — Progress Notes (Addendum)
Initial Nutrition Assessment  DOCUMENTATION CODES:   Non-severe (moderate) malnutrition in context of chronic illness  INTERVENTION:   Vital 1.2@50ml /hr- Initiate at 99ml/hr and increase by 68ml/hr q 8 hours until goal rate is reached.   Free water flushes 110ml q4 hours to maintain tube patency   Regimen provides 1440kcal/day, 90g/day protein and 119ml/day of fluid.   Pt at high refeed risk; recommend monitor potassium, magnesium and phosphorus labs daily until stable  Daily weights   NUTRITION DIAGNOSIS:   Moderate Malnutrition related to chronic illness (COPD, cirrhosis) as evidenced by mild fat depletion, moderate fat depletion, moderate muscle depletion.  GOAL:   Provide needs based on ASPEN/SCCM guidelines  MONITOR:   Vent status, Labs, Weight trends, TF tolerance, I & O's, Skin  REASON FOR ASSESSMENT:   Ventilator    ASSESSMENT:   67 y/o female with h/o HTN, TBI, COPD, CAD, cirrhosis, bipolar disorder, pacemaker, anxiety, PAD, hep C, PAF, sick sinus syndrome, CHF, seizures, marijuana use and tremors who is admitted with lithium toxicity, AMS and AKI.  Pt sedated and ventilated. OGT in place. Plan is to start tube feeds today. Pt is at high refeed risk. Per chart, pt appears weight stable pta.   Medications reviewed and include: colace, lovenox, pepcid, miralax, NaCl @150ml /hr, levophed, propofol   Labs reviewed: K 4.3 wnl, creat 1.32(H), P 3.3 wnl, Mg 2.1 wnl Cbgs- 80, 100, 115 x 24 hrs AIC 5.1- 3/12  Patient is currently intubated on ventilator support MV: 7.8 L/min Temp (24hrs), Avg:98.9 F (37.2 C), Min:98.4 F (36.9 C), Max:99.3 F (37.4 C)  Propofol: 8.33 ml/hr- provides 220kcal/day   MAP- >42mmHg   UOP- 814ml   NUTRITION - FOCUSED PHYSICAL EXAM:  Flowsheet Row Most Recent Value  Orbital Region No depletion  Upper Arm Region Moderate depletion  Thoracic and Lumbar Region Mild depletion  Buccal Region No depletion  Temple Region Moderate  depletion  Clavicle Bone Region Moderate depletion  Clavicle and Acromion Bone Region Moderate depletion  Scapular Bone Region Mild depletion  Dorsal Hand Unable to assess  Patellar Region Severe depletion  Anterior Thigh Region Severe depletion  Posterior Calf Region Severe depletion  Edema (RD Assessment) None  Hair Reviewed  Eyes Reviewed  Mouth Reviewed  Skin Reviewed  Nails Reviewed   Diet Order:   Diet Order             Diet NPO time specified  Diet effective now                  EDUCATION NEEDS:   No education needs have been identified at this time  Skin:  Skin Assessment: Reviewed RN Assessment (ecchymosis)  Last BM:  PTA  Height:   Ht Readings from Last 1 Encounters:  12/15/22 5\' 5"  (1.651 m)    Weight:   Wt Readings from Last 1 Encounters:  12/16/22 58.9 kg    Ideal Body Weight:  56.8 kg  BMI:  Body mass index is 21.61 kg/m.  Estimated Nutritional Needs:   Kcal:  1360kcal/day  Protein:  85-100g/day  Fluid:  1.4-1.6L/day  Koleen Distance MS, RD, LDN Please refer to [facility] for RD and/or RD on-call/weekend/after hours pager
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
## 2022-12-16 NOTE — Progress Notes (Signed)
Pt transported to ICU 11 on the vent without incident. Pt remains on the vent, report given to ICU RT.

## 2022-12-16 NOTE — Progress Notes (Signed)
eLink Physician-Brief Progress Note Patient Name: Cynthia [name] DOB: May 16, 1956 MRN: [id number]   Date of Service  12/16/2022  HPI/Events of Note  67 year old who presented to the emergency department with increased fatigue, tremors, and pinpoint pupils with difficulty arousing in the setting of baseline bipolar disease, oxycodone use.  Patient was covered in feces and urine.  Eventually intubated in the emergency department and referred to ICU for further management.  eICU Interventions  Chest radiograph reviewed with ET tube in appropriate place.  Lithium level elevated without active tremors.  Propofol and fentanyl in place.  No serotonergic drugs ordered.  Anticipate daily awakening and spontaneous breathing trials.  Gas acceptable, no changes indicated.  For now, pending ground team evaluation, but no acute intervention indicated.  Likely needs continuous IVF.     Intervention Category Evaluation Type: New Patient Evaluation  Genene Kilman 12/16/2022, 5:30 AM

## 2022-12-16 NOTE — Sedation Documentation (Signed)
BVM placed by RT

## 2022-12-16 NOTE — Progress Notes (Signed)
*  PRELIMINARY RESULTS* Echocardiogram 2D Echocardiogram has been performed.  [name] 12/16/2022, 1:00 PM

## 2022-12-16 NOTE — Consult Note (Signed)
PHARMACY CONSULT NOTE - FOLLOW UP  Pharmacy Consult for Electrolyte Monitoring and Replacement   Recent Labs: Potassium (mmol/L)  Date Value  12/16/2022 4.3   Magnesium (mg/dL)  Date Value  12/16/2022 2.1   Calcium (mg/dL)  Date Value  12/16/2022 9.1   Albumin (g/dL)  Date Value  05/06/2021 3.4 (L)  06/25/2019 4.5   Phosphorus (mg/dL)  Date Value  12/16/2022 3.3   Sodium  Date Value  12/16/2022 138 mmol/L  12/10/2021 154 (A)     Assessment: 67 yo F with pertinent PMH pAF not on AC d/t fall risk presents with AMS and tremors ISO lithium toxicity and c/f polypharmacy. Pharmacy consulted to manage electrolytes while patient in the CCU.  Goal of Therapy:  K >/= 4.0 and Mg >/= 2.0  Plan:  No electrolyte replacement warranted today Will monitor electrolytes with AM labs  Delena Bali ,PharmD Clinical Pharmacist 12/16/2022 10:07 AM

## 2022-12-16 NOTE — Sedation Documentation (Signed)
7.5 tube, 20 @tooth . Positive color change on ETT

## 2022-12-16 NOTE — ED Notes (Addendum)
Pt is not as drowsy as before but now having a hard time with secretions, occasional gargling sounds are heard as she is unable to swallow secretions. Sats are 95-99% on RA, Pts HOB at 90 degrees. Provider notified

## 2022-12-16 NOTE — ED Provider Notes (Signed)
Patient received in signout from Dr. Jori Moll pending reassessment after a second dose of Narcan as well as an ammonia level and VBG in the setting of nonfocal encephalopathy and somnolence.  Suspect polypharmacy.  No significant improvement with IV Narcan.  Also add on a lithium level as she is on this medication for her bipolar disorder and this returns elevated at 1.94.  No indications for emergent dialysis but I suspect this is contributing to her encephalopathy.  When I reevaluate the patient, she has gurgling respirations and I am quite concerned about her ability to maintain her airway in the setting of her encephalopathy and pulmonary congestion.  I attempted to call her husband, but he does not answer so I will leave a HIPAA compliant voicemail to ask him to call back to the ER.  I intubate the patient and consult ICU who agrees to admit.  .Critical Care  Performed by: Vladimir Crofts, MD Authorized by: Vladimir Crofts, MD   Critical care provider statement:    Critical care time (minutes):  30   Critical care time was exclusive of:  Separately billable procedures and treating other patients   Critical care was necessary to treat or prevent imminent or life-threatening deterioration of the following conditions:  Respiratory failure and CNS failure or compromise   Critical care was time spent personally by me on the following activities:  Development of treatment plan with patient or surrogate, discussions with consultants, evaluation of patient's response to treatment, examination of patient, ordering and review of laboratory studies, ordering and review of radiographic studies, ordering and performing treatments and interventions, pulse oximetry, re-evaluation of patient's condition and review of old charts Procedure Name: Intubation Date/Time: 12/16/2022 3:47 AM  Performed by: Vladimir Crofts, MDPre-anesthesia Checklist: Patient identified, Patient being monitored, Emergency Drugs available,  Timeout performed and Suction available Oxygen Delivery Method: Non-rebreather mask Preoxygenation: Pre-oxygenation with 100% oxygen Induction Type: Rapid sequence Ventilation: Mask ventilation without difficulty Laryngoscope Size: 3 and Glidescope Tube size: 7.5 mm Number of attempts: 1 Airway Equipment and Method: Rigid stylet Placement Confirmation: ETT inserted through vocal cords under direct vision, CO2 detector and Breath sounds checked- equal and bilateral Secured at: 20 cm Tube secured with: ETT holder    .1-3 Lead EKG Interpretation  Performed by: Vladimir Crofts, MD Authorized by: Vladimir Crofts, MD     Interpretation: normal     ECG rate:  60   ECG rate assessment: normal     Rhythm: sinus rhythm     Ectopy: none     Conduction: normal       Vladimir Crofts, MD 12/16/22 [phone]

## 2022-12-17 ENCOUNTER — Inpatient Hospital Stay: Payer: 59

## 2022-12-17 DIAGNOSIS — T56891A Toxic effect of other metals, accidental (unintentional), initial encounter: Secondary | ICD-10-CM | POA: Diagnosis not present

## 2022-12-17 DIAGNOSIS — J9601 Acute respiratory failure with hypoxia: Secondary | ICD-10-CM | POA: Diagnosis not present

## 2022-12-17 DIAGNOSIS — N179 Acute kidney failure, unspecified: Secondary | ICD-10-CM | POA: Diagnosis not present

## 2022-12-17 DIAGNOSIS — G928 Other toxic encephalopathy: Secondary | ICD-10-CM | POA: Diagnosis not present

## 2022-12-17 DIAGNOSIS — E872 Acidosis, unspecified: Secondary | ICD-10-CM

## 2022-12-17 LAB — CBC
HCT: 40.4 % (ref 36.0–46.0)
Hemoglobin: 12.3 g/dL (ref 12.0–15.0)
MCH: 30.7 pg (ref 26.0–34.0)
MCHC: 30.4 g/dL (ref 30.0–36.0)
MCV: 100.7 fL — ABNORMAL HIGH (ref 80.0–100.0)
Platelets: 206 10*3/uL (ref 150–400)
RBC: 4.01 MIL/uL (ref 3.87–5.11)
RDW: 13.3 % (ref 11.5–15.5)
WBC: 12.8 10*3/uL — ABNORMAL HIGH (ref 4.0–10.5)
nRBC: 0 % (ref 0.0–0.2)

## 2022-12-17 LAB — BLOOD GAS, ARTERIAL
Acid-base deficit: 8.8 mmol/L — ABNORMAL HIGH (ref 0.0–2.0)
Bicarbonate: 17.4 mmol/L — ABNORMAL LOW (ref 20.0–28.0)
FIO2: 40 %
MECHVT: 550 mL
Mechanical Rate: 20
O2 Saturation: 97.7 %
PEEP: 5 cmH2O
Patient temperature: 37
pCO2 arterial: 38 mmHg (ref 32–48)
pH, Arterial: 7.27 — ABNORMAL LOW (ref 7.35–7.45)
pO2, Arterial: 81 mmHg — ABNORMAL LOW (ref 83–108)

## 2022-12-17 LAB — LITHIUM LEVEL: Lithium Lvl: 1.21 mmol/L — ABNORMAL HIGH (ref 0.60–1.20)

## 2022-12-17 LAB — RENAL FUNCTION PANEL
Albumin: 3 g/dL — ABNORMAL LOW (ref 3.5–5.0)
Anion gap: 5 (ref 5–15)
BUN: 13 mg/dL (ref 8–23)
CO2: 17 mmol/L — ABNORMAL LOW (ref 22–32)
Calcium: 8.8 mg/dL — ABNORMAL LOW (ref 8.9–10.3)
Chloride: 117 mmol/L — ABNORMAL HIGH (ref 98–111)
Creatinine, Ser: 1.08 mg/dL — ABNORMAL HIGH (ref 0.44–1.00)
GFR, Estimated: 56 mL/min — ABNORMAL LOW (ref >60)
Glucose, Bld: 162 mg/dL — ABNORMAL HIGH (ref 70–99)
Phosphorus: 3.2 mg/dL (ref 2.5–4.6)
Potassium: 4.2 mmol/L (ref 3.5–5.1)
Sodium: 139 mmol/L (ref 135–145)

## 2022-12-17 LAB — GLUCOSE, CAPILLARY
Glucose-Capillary: 158 mg/dL — ABNORMAL HIGH (ref 70–99)
Glucose-Capillary: 140 mg/dL — ABNORMAL HIGH (ref 70–99)
Glucose-Capillary: 143 mg/dL — ABNORMAL HIGH (ref 70–99)
Glucose-Capillary: 159 mg/dL — ABNORMAL HIGH (ref 70–99)
Glucose-Capillary: 168 mg/dL — ABNORMAL HIGH (ref 70–99)
Glucose-Capillary: 167 mg/dL — ABNORMAL HIGH (ref 70–99)

## 2022-12-17 LAB — LACTIC ACID, PLASMA: Lactic Acid, Venous: 0.9 mmol/L (ref 0.5–1.9)

## 2022-12-17 LAB — URINE CULTURE: Culture: NO GROWTH

## 2022-12-17 LAB — MRSA NEXT GEN BY PCR, NASAL: MRSA by PCR Next Gen: NOT DETECTED

## 2022-12-17 LAB — MAGNESIUM: Magnesium: 1.8 mg/dL (ref 1.7–2.4)

## 2022-12-17 MED ORDER — SODIUM CHLORIDE 0.9 % IV BOLUS
500.0000 mL | Freq: Once | INTRAVENOUS | Status: AC
  Administered 2022-12-17: 500 mL via INTRAVENOUS

## 2022-12-17 MED ORDER — LACTATED RINGERS IV SOLN: INTRAVENOUS | Status: DC

## 2022-12-17 MED ORDER — SODIUM BICARBONATE 8.4 % IV SOLN
50.0000 meq | Freq: Once | INTRAVENOUS | Status: AC
  Administered 2022-12-17: 50 meq via INTRAVENOUS
  Filled 2022-12-17: qty 50

## 2022-12-17 MED ORDER — PIPERACILLIN-TAZOBACTAM 3.375 G IVPB
3.3750 g | Freq: Three times a day (TID) | INTRAVENOUS | Status: AC
  Administered 2022-12-17 – 2022-12-22 (×15): 3.375 g via INTRAVENOUS
  Filled 2022-12-17 (×15): qty 50

## 2022-12-17 MED ORDER — MAGNESIUM SULFATE 2 GM/50ML IV SOLN
2.0000 g | Freq: Once | INTRAVENOUS | Status: AC
  Administered 2022-12-17: 2 g via INTRAVENOUS
  Filled 2022-12-17: qty 50

## 2022-12-17 NOTE — Consult Note (Signed)
[place] for Electrolyte Monitoring and Replacement   Recent Labs: Potassium (mmol/L)  Date Value  12/17/2022 4.2   Magnesium (mg/dL)  Date Value  12/17/2022 1.8   Calcium (mg/dL)  Date Value  12/17/2022 8.8 (L)   Albumin (g/dL)  Date Value  12/17/2022 3.0 (L)  06/25/2019 4.5   Phosphorus (mg/dL)  Date Value  12/17/2022 3.2   Sodium  Date Value  12/17/2022 139 mmol/L  12/10/2021 154 (A)   Assessment: 67 yo F with pertinent PMH pAF not on AC d/t fall risk presents with AMS and tremors ISO lithium toxicity and c/f polypharmacy. Pharmacy consulted to manage electrolytes while patient in the CCU.  Intubated, sedated on mechanical ventilation Nutrition: Tube feeds started 3/22 afternoon MIVF: NS at 150 cc/hr NE at 3 mcg/min  Goal of Therapy:  K >/= 4.0 and Mg >/= 2.0  Plan:  --Mg 1.8, will give magnesium sulfate 2 g IV x 1 --Will monitor electrolytes with AM labs  Benita Gutter 12/17/2022 7:40 AM
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
## 2022-12-18 DIAGNOSIS — J9601 Acute respiratory failure with hypoxia: Secondary | ICD-10-CM | POA: Diagnosis not present

## 2022-12-18 DIAGNOSIS — R4182 Altered mental status, unspecified: Secondary | ICD-10-CM | POA: Diagnosis not present

## 2022-12-18 DIAGNOSIS — T56891A Toxic effect of other metals, accidental (unintentional), initial encounter: Secondary | ICD-10-CM | POA: Diagnosis not present

## 2022-12-18 DIAGNOSIS — G928 Other toxic encephalopathy: Secondary | ICD-10-CM | POA: Diagnosis not present

## 2022-12-18 DIAGNOSIS — N179 Acute kidney failure, unspecified: Secondary | ICD-10-CM | POA: Diagnosis not present

## 2022-12-18 LAB — BLOOD GAS, VENOUS
Acid-base deficit: 6.4 mmol/L — ABNORMAL HIGH (ref 0.0–2.0)
Bicarbonate: 20.2 mmol/L (ref 20.0–28.0)
O2 Saturation: 51.1 %
Patient temperature: 37
pCO2, Ven: 43 mmHg — ABNORMAL LOW (ref 44–60)
pH, Ven: 7.28 (ref 7.25–7.43)
pO2, Ven: 32 mmHg (ref 32–45)

## 2022-12-18 LAB — CBC
HCT: 37.7 % (ref 36.0–46.0)
Hemoglobin: 11.5 g/dL — ABNORMAL LOW (ref 12.0–15.0)
MCH: 31.1 pg (ref 26.0–34.0)
MCHC: 30.5 g/dL (ref 30.0–36.0)
MCV: 101.9 fL — ABNORMAL HIGH (ref 80.0–100.0)
Platelets: 131 10*3/uL — ABNORMAL LOW (ref 150–400)
RBC: 3.7 MIL/uL — ABNORMAL LOW (ref 3.87–5.11)
RDW: 14 % (ref 11.5–15.5)
WBC: 9 10*3/uL (ref 4.0–10.5)
nRBC: 0 % (ref 0.0–0.2)

## 2022-12-18 LAB — GLUCOSE, CAPILLARY
Glucose-Capillary: 156 mg/dL — ABNORMAL HIGH (ref 70–99)
Glucose-Capillary: 150 mg/dL — ABNORMAL HIGH (ref 70–99)
Glucose-Capillary: 190 mg/dL — ABNORMAL HIGH (ref 70–99)
Glucose-Capillary: 163 mg/dL — ABNORMAL HIGH (ref 70–99)
Glucose-Capillary: 139 mg/dL — ABNORMAL HIGH (ref 70–99)
Glucose-Capillary: 148 mg/dL — ABNORMAL HIGH (ref 70–99)

## 2022-12-18 LAB — RENAL FUNCTION PANEL
Albumin: 2.4 g/dL — ABNORMAL LOW (ref 3.5–5.0)
Anion gap: 8 (ref 5–15)
BUN: 11 mg/dL (ref 8–23)
CO2: 18 mmol/L — ABNORMAL LOW (ref 22–32)
Calcium: 8.8 mg/dL — ABNORMAL LOW (ref 8.9–10.3)
Chloride: 116 mmol/L — ABNORMAL HIGH (ref 98–111)
Creatinine, Ser: 1.01 mg/dL — ABNORMAL HIGH (ref 0.44–1.00)
GFR, Estimated: >60 mL/min (ref >60)
Glucose, Bld: 183 mg/dL — ABNORMAL HIGH (ref 70–99)
Phosphorus: 2.9 mg/dL (ref 2.5–4.6)
Potassium: 3.7 mmol/L (ref 3.5–5.1)
Sodium: 142 mmol/L (ref 135–145)

## 2022-12-18 LAB — LITHIUM LEVEL: Lithium Lvl: 0.68 mmol/L (ref 0.60–1.20)

## 2022-12-18 MED ORDER — LABETALOL HCL 5 MG/ML IV SOLN
10.0000 mg | INTRAVENOUS | Status: DC | PRN
  Administered 2022-12-18 (×2): 10 mg via INTRAVENOUS
  Filled 2022-12-18 (×2): qty 4

## 2022-12-18 MED ORDER — DEXMEDETOMIDINE HCL IN NACL 400 MCG/100ML IV SOLN
0.0000 ug/kg/h | INTRAVENOUS | Status: DC
  Administered 2022-12-18: 0.4 ug/kg/h via INTRAVENOUS
  Administered 2022-12-19: 0.5 ug/kg/h via INTRAVENOUS
  Administered 2022-12-19: 0.2 ug/kg/h via INTRAVENOUS
  Administered 2022-12-19 – 2022-12-20 (×2): 0.7 ug/kg/h via INTRAVENOUS
  Administered 2022-12-20: 0.6 ug/kg/h via INTRAVENOUS
  Administered 2022-12-21: 0.8 ug/kg/h via INTRAVENOUS
  Filled 2022-12-18 (×6): qty 100

## 2022-12-18 MED ORDER — POTASSIUM CHLORIDE 20 MEQ PO PACK
40.0000 meq | PACK | Freq: Once | ORAL | Status: AC
  Administered 2022-12-18: 40 meq
  Filled 2022-12-18: qty 2

## 2022-12-18 NOTE — Progress Notes (Signed)
SBT at 0905 by RT at bedside. Sedation paused prior to SBT - pt unable to follow commands at this time. 0920-Dr Dgayli to bedside, SBP >210 on both arms. Plan for Labetalol. Pt not showing other signs of agitation, and tolerating SBT well. Pt remains unable to follow commands. 59- Dr Genia Harold to bedside for evaluation after update given. Pt having periods of agitation - plan to leave Propofol and Fentanyl off and to start Precedex should agitation continue. Pt remains unable to follow commands and continues to have large amounts of oral and inline secretions.  1330-Family at bedside and given update on pt condition. 1406-RT called for update on pt condition. Pt having decreased RR - plan to place pt on previous ventilator settings.
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
## 2022-12-18 NOTE — Consult Note (Signed)
[place] for Electrolyte Monitoring and Replacement   Recent Labs: Potassium (mmol/L)  Date Value  12/18/2022 3.7   Magnesium (mg/dL)  Date Value  12/17/2022 1.8   Calcium (mg/dL)  Date Value  12/18/2022 8.8 (L)   Albumin (g/dL)  Date Value  12/18/2022 2.4 (L)  06/25/2019 4.5   Phosphorus (mg/dL)  Date Value  12/18/2022 2.9   Sodium  Date Value  12/18/2022 142 mmol/L  12/10/2021 154 (A)   Assessment: 67 yo F with pertinent PMH pAF not on AC d/t fall risk presents with AMS and tremors ISO lithium toxicity and c/f polypharmacy. Pharmacy consulted to manage electrolytes while patient in the CCU.  Intubated, sedated on mechanical ventilation Nutrition: Tube feeds started 3/22 afternoon MIVF: NS at 150 cc/hr - stopped. > LR @ 100 ml/hr.  NE at 2 mcg/min  Goal of Therapy:  K >/= 4.0 and Mg >/= 2.0  Plan:  --KCL 40 mEq x 1 via tube.  --f/u with AM labs.   Oswald Hillock, PharmD, BCPS 12/18/2022 8:29 AM

## 2022-12-18 NOTE — Progress Notes (Addendum)
Dr Genia Harold given update on pt condition, eyes having downward gaze with slight nystagmus. Neurology to be consulted, with plans to potentially start [place]. [place] initiated at approximately 1815.

## 2022-12-18 NOTE — Procedures (Incomplete)
Patient Name: Cynthia [name]  MRN: [id number]  Epilepsy Attending: Lora Havens  Referring Physician/Provider: Armando Reichert, MD  Date: 12/18/2022 Duration: 12/18/2022   Patient history: 67yo F with ams. EEG to evaluate for seizure  Level of alertness: comatose  AEDs during EEG study: Propofol  Technical aspects:This EEG was obtained using a 10 lead EEG system positioned circumferentially without any parasagittal coverage (rapid EEG). Computer selected EEG is reviewed as  well as background features and all clinically significant events.  Description: EEG showed continuous generalized 3 to 6 Hz theta-delta slowing, at times with triphasic morphology. Hyperventilation and photic stimulation were not performed.     ABNORMALITY - Continuous slow, generalized  IMPRESSION: This limited ceribell EEG is suggestive of moderate diffuse encephalopathy, nonspecific etiology but could be secondary to toxic-metabolic causes. No seizures or epileptiform discharges were seen throughout the recording.  If suspicion for ictal-interictal activity remains a concern, a conventional EEG can be considered.   [name]

## 2022-12-19 ENCOUNTER — Inpatient Hospital Stay: Payer: 59

## 2022-12-19 DIAGNOSIS — T56892A Toxic effect of other metals, intentional self-harm, initial encounter: Secondary | ICD-10-CM

## 2022-12-19 DIAGNOSIS — R4182 Altered mental status, unspecified: Secondary | ICD-10-CM | POA: Diagnosis not present

## 2022-12-19 DIAGNOSIS — E440 Moderate protein-calorie malnutrition: Secondary | ICD-10-CM | POA: Insufficient documentation

## 2022-12-19 DIAGNOSIS — F17210 Nicotine dependence, cigarettes, uncomplicated: Secondary | ICD-10-CM

## 2022-12-19 LAB — RENAL FUNCTION PANEL
Albumin: 2.5 g/dL — ABNORMAL LOW (ref 3.5–5.0)
Anion gap: 5 (ref 5–15)
BUN: 13 mg/dL (ref 8–23)
CO2: 18 mmol/L — ABNORMAL LOW (ref 22–32)
Calcium: 8.7 mg/dL — ABNORMAL LOW (ref 8.9–10.3)
Chloride: 116 mmol/L — ABNORMAL HIGH (ref 98–111)
Creatinine, Ser: 0.92 mg/dL (ref 0.44–1.00)
GFR, Estimated: >60 mL/min (ref >60)
Glucose, Bld: 202 mg/dL — ABNORMAL HIGH (ref 70–99)
Phosphorus: 3.4 mg/dL (ref 2.5–4.6)
Potassium: 4.4 mmol/L (ref 3.5–5.1)
Sodium: 139 mmol/L (ref 135–145)

## 2022-12-19 LAB — CBC
HCT: 34.2 % — ABNORMAL LOW (ref 36.0–46.0)
Hemoglobin: 10.4 g/dL — ABNORMAL LOW (ref 12.0–15.0)
MCH: 31.3 pg (ref 26.0–34.0)
MCHC: 30.4 g/dL (ref 30.0–36.0)
MCV: 103 fL — ABNORMAL HIGH (ref 80.0–100.0)
Platelets: 96 10*3/uL — ABNORMAL LOW (ref 150–400)
RBC: 3.32 MIL/uL — ABNORMAL LOW (ref 3.87–5.11)
RDW: 14 % (ref 11.5–15.5)
WBC: 5.4 10*3/uL (ref 4.0–10.5)
nRBC: 0 % (ref 0.0–0.2)

## 2022-12-19 LAB — MAGNESIUM: Magnesium: 1.9 mg/dL (ref 1.7–2.4)

## 2022-12-19 LAB — GLUCOSE, CAPILLARY
Glucose-Capillary: 175 mg/dL — ABNORMAL HIGH (ref 70–99)
Glucose-Capillary: 173 mg/dL — ABNORMAL HIGH (ref 70–99)
Glucose-Capillary: 176 mg/dL — ABNORMAL HIGH (ref 70–99)
Glucose-Capillary: 181 mg/dL — ABNORMAL HIGH (ref 70–99)
Glucose-Capillary: 172 mg/dL — ABNORMAL HIGH (ref 70–99)
Glucose-Capillary: 157 mg/dL — ABNORMAL HIGH (ref 70–99)
Glucose-Capillary: 166 mg/dL — ABNORMAL HIGH (ref 70–99)

## 2022-12-19 LAB — TRIGLYCERIDES: Triglycerides: 87 mg/dL (ref <150)

## 2022-12-19 MED ORDER — INSULIN ASPART 100 UNIT/ML IJ SOLN
0.0000 [IU] | Freq: Four times a day (QID) | INTRAMUSCULAR | Status: DC
  Administered 2022-12-19 – 2022-12-20 (×3): 2 [IU] via SUBCUTANEOUS
  Administered 2022-12-20: 3 [IU] via SUBCUTANEOUS
  Administered 2022-12-20: 1 [IU] via SUBCUTANEOUS
  Administered 2022-12-20: 3 [IU] via SUBCUTANEOUS
  Filled 2022-12-19 (×6): qty 1

## 2022-12-19 MED ORDER — GLYCOPYRROLATE 0.2 MG/ML IJ SOLN
0.1000 mg | Freq: Three times a day (TID) | INTRAMUSCULAR | Status: DC | PRN
  Administered 2022-12-19 – 2022-12-21 (×5): 0.1 mg via INTRAVENOUS
  Filled 2022-12-19 (×5): qty 1

## 2022-12-19 MED ORDER — PROPOFOL 1000 MG/100ML IV EMUL: 5.0000 ug/kg/min | INTRAVENOUS | Status: DC
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
## 2022-12-19 NOTE — Progress Notes (Signed)
Eeg done 

## 2022-12-19 NOTE — Consult Note (Signed)
[place] for Electrolyte Monitoring and Replacement   Recent Labs: Potassium (mmol/L)  Date Value  12/19/2022 4.4   Magnesium (mg/dL)  Date Value  12/19/2022 1.9   Calcium (mg/dL)  Date Value  12/19/2022 8.7 (L)   Albumin (g/dL)  Date Value  12/19/2022 2.5 (L)  06/25/2019 4.5   Phosphorus (mg/dL)  Date Value  12/19/2022 3.4   Sodium  Date Value  12/19/2022 139 mmol/L  12/10/2021 154 (A)   Assessment: 67 yo F with pertinent PMH pAF not on AC d/t fall risk presents with AMS and tremors ISO lithium toxicity and c/f polypharmacy. Pharmacy consulted to manage electrolytes while patient in the CCU.  Intubated, sedated on mechanical ventilation Nutrition: Tube feeds started 3/22 afternoon MIVF: NS at 150 cc/hr - stopped. > LR @ 100 ml/hr.  NE at 2 mcg/min  Goal of Therapy:  K >/= 4.0 and Mg >/= 2.0  Plan:  --No electrolyte replacement currently warranted --f/u with AM labs.   Pearla Dubonnet, PharmD 12/19/2022 8:01 AM

## 2022-12-19 NOTE — Procedures (Signed)
Routine EEG Report  Cynthia [name] is a 67 y.o. female with a history of altered mental status who is undergoing an EEG to evaluate for seizures.  Report: This EEG was acquired with electrodes placed according to the International 10-20 electrode system (including Fp1, Fp2, F3, F4, C3, C4, P3, P4, O1, O2, T3, T4, T5, T6, A1, A2, Fz, Cz, Pz). The following electrodes were missing or displaced: none.  The background consisted of diffuse slowing at 3-6 Hz. Frequent triphasic waves were noted which at times became briefly periodic at 1 Hz. These did not appear epileptiform. There was no clear waking rhythm; deeper stages of sleep were identified by K complexes and sleep spindles. There was no focal slowing. There were no interictal epileptiform discharges. There were no electrographic seizures identified. Photic stimulation and hyperventilation were not performed.  Impression and clinical correlation: This EEG was obtained while sedated on precedex and is abnormal due to moderate-to-severe diffuse slowing indicative of global cerebral dysfunction, medication effect, or both. Triphasic waves are indicative of metabolic encephalopathy. Epileptiform abnormalities were not seen during this recording.  Su Monks, MD [facility] [phone]  If 7pm- 7am, please page neurology on call as listed in [place].

## 2022-12-20 ENCOUNTER — Inpatient Hospital Stay: Payer: 59

## 2022-12-20 ENCOUNTER — Telehealth: Payer: Self-pay | Admitting: Family Medicine

## 2022-12-20 DIAGNOSIS — T56891S Toxic effect of other metals, accidental (unintentional), sequela: Secondary | ICD-10-CM

## 2022-12-20 DIAGNOSIS — R4182 Altered mental status, unspecified: Secondary | ICD-10-CM | POA: Diagnosis not present

## 2022-12-20 DIAGNOSIS — F17210 Nicotine dependence, cigarettes, uncomplicated: Secondary | ICD-10-CM | POA: Diagnosis not present

## 2022-12-20 LAB — RENAL FUNCTION PANEL
Albumin: 2.4 g/dL — ABNORMAL LOW (ref 3.5–5.0)
Anion gap: 3 — ABNORMAL LOW (ref 5–15)
BUN: 17 mg/dL (ref 8–23)
CO2: 21 mmol/L — ABNORMAL LOW (ref 22–32)
Calcium: 8.6 mg/dL — ABNORMAL LOW (ref 8.9–10.3)
Chloride: 115 mmol/L — ABNORMAL HIGH (ref 98–111)
Creatinine, Ser: 0.85 mg/dL (ref 0.44–1.00)
GFR, Estimated: >60 mL/min (ref >60)
Glucose, Bld: 177 mg/dL — ABNORMAL HIGH (ref 70–99)
Phosphorus: 3.6 mg/dL (ref 2.5–4.6)
Potassium: 4.2 mmol/L (ref 3.5–5.1)
Sodium: 139 mmol/L (ref 135–145)

## 2022-12-20 LAB — MAGNESIUM: Magnesium: 1.9 mg/dL (ref 1.7–2.4)

## 2022-12-20 LAB — CBC
HCT: 34.3 % — ABNORMAL LOW (ref 36.0–46.0)
Hemoglobin: 10.3 g/dL — ABNORMAL LOW (ref 12.0–15.0)
MCH: 31.1 pg (ref 26.0–34.0)
MCHC: 30 g/dL (ref 30.0–36.0)
MCV: 103.6 fL — ABNORMAL HIGH (ref 80.0–100.0)
Platelets: 117 10*3/uL — ABNORMAL LOW (ref 150–400)
RBC: 3.31 MIL/uL — ABNORMAL LOW (ref 3.87–5.11)
RDW: 14 % (ref 11.5–15.5)
WBC: 4.7 10*3/uL (ref 4.0–10.5)
nRBC: 0 % (ref 0.0–0.2)

## 2022-12-20 LAB — CULTURE, RESPIRATORY W GRAM STAIN: Culture: NORMAL

## 2022-12-20 LAB — GLUCOSE, CAPILLARY
Glucose-Capillary: 222 mg/dL — ABNORMAL HIGH (ref 70–99)
Glucose-Capillary: 134 mg/dL — ABNORMAL HIGH (ref 70–99)
Glucose-Capillary: 145 mg/dL — ABNORMAL HIGH (ref 70–99)
Glucose-Capillary: 201 mg/dL — ABNORMAL HIGH (ref 70–99)
Glucose-Capillary: 214 mg/dL — ABNORMAL HIGH (ref 70–99)

## 2022-12-20 LAB — VOLATILES,BLD-ACETONE,ETHANOL,ISOPROP,METHANOL
Acetone, blood: <0.01 g/dL (ref 0.000–0.010)
Ethanol, blood: <0.01 g/dL (ref 0.000–0.010)
Isopropanol, blood: <0.01 g/dL (ref 0.000–0.010)
Methanol, blood: <0.01 g/dL (ref 0.000–0.010)

## 2022-12-20 MED ORDER — ALBUMIN HUMAN 25 % IV SOLN
25.0000 g | Freq: Once | INTRAVENOUS | Status: AC
  Administered 2022-12-20: 25 g via INTRAVENOUS
  Filled 2022-12-20: qty 100

## 2022-12-20 MED ORDER — FUROSEMIDE 10 MG/ML IJ SOLN
40.0000 mg | Freq: Once | INTRAMUSCULAR | Status: AC
  Administered 2022-12-20: 40 mg via INTRAVENOUS
  Filled 2022-12-20: qty 4

## 2022-12-20 MED ORDER — INSULIN ASPART 100 UNIT/ML IJ SOLN
0.0000 [IU] | INTRAMUSCULAR | Status: DC
  Administered 2022-12-20: 5 [IU] via SUBCUTANEOUS
  Administered 2022-12-21: 2 [IU] via SUBCUTANEOUS
  Administered 2022-12-21: 3 [IU] via SUBCUTANEOUS
  Administered 2022-12-21: 5 [IU] via SUBCUTANEOUS
  Administered 2022-12-21 (×4): 3 [IU] via SUBCUTANEOUS
  Administered 2022-12-22 – 2022-12-23 (×4): 2 [IU] via SUBCUTANEOUS
  Filled 2022-12-20 (×13): qty 1

## 2022-12-20 MED ORDER — DEXAMETHASONE SODIUM PHOSPHATE 4 MG/ML IJ SOLN
4.0000 mg | Freq: Four times a day (QID) | INTRAMUSCULAR | Status: AC
  Administered 2022-12-20 – 2022-12-21 (×4): 4 mg via INTRAVENOUS
  Filled 2022-12-20 (×4): qty 1

## 2022-12-20 NOTE — Telephone Encounter (Signed)
Copied from [place] [id number]. Topic: General - Other >> Dec 20, 2022  9:41 AM [place] B wrote: Reason for CRM: Patients husband called in wanting to speak with Dr Lenoard Aden nurse, he wouldn't go into details

## 2022-12-20 NOTE — Progress Notes (Signed)
ICU reports patient's mental status is significantly improved and they will cancel the neurology consult.  Su Monks, MD [facility] [phone]  If 7pm- 7am, please page neurology on call as listed in [place].

## 2022-12-20 NOTE — Telephone Encounter (Signed)
Pt spouse wanted to let you know she is at [employer]

## 2022-12-20 NOTE — Consult Note (Signed)
[place] for Electrolyte Monitoring and Replacement   Recent Labs: Potassium (mmol/L)  Date Value  12/20/2022 4.2   Magnesium (mg/dL)  Date Value  12/20/2022 1.9   Calcium (mg/dL)  Date Value  12/20/2022 8.6 (L)   Albumin (g/dL)  Date Value  12/20/2022 2.4 (L)  06/25/2019 4.5   Phosphorus (mg/dL)  Date Value  12/20/2022 3.6   Sodium  Date Value  12/20/2022 139 mmol/L  12/10/2021 154 (A)   Assessment: 67 yo F with pertinent PMH pAF not on AC d/t fall risk presents with AMS and tremors ISO lithium toxicity and c/f polypharmacy. Pharmacy consulted to manage electrolytes while patient in the CCU.  Intubated, sedated on mechanical ventilation Nutrition: Tube feeds started 3/22 afternoon MIVF: NS at 150 cc/hr - stopped. > LR @ 100 ml/hr.  NE at 2 mcg/min  Goal of Therapy:  K >/= 4.0 and Mg >/= 2.0  Plan:  --No electrolyte replacement currently warranted --f/u with AM labs.   Pearla Dubonnet, PharmD 12/20/2022 7:55 AM

## 2022-12-20 NOTE — IPAL (Signed)
  Interdisciplinary Goals of Care Family Meeting   Date carried out: 12/20/2022  Location of the meeting: Bedside  Member's involved: Physician, Bedside Registered Nurse, and Family Member or next of kin     GOALS OF CARE DISCUSSION  The Clinical status was relayed to family in [name] and Husband at Bedside   Updated and notified of patients medical condition- Patient remains unresponsive and will not open eyes to command.   Patient is having a weak cough and struggling to remove secretions.    Explained to family course of therapy and the modalities   Patient with Progressive multiorgan failure with a very high probablity of a very minimal chance of meaningful recovery despite all aggressive and optimal medical therapy.   PATIENT REMAINS FULL CODE Patient has been failing weaning trials, will plan for weaning trial tomorrow AM Patient seems to be more interactive with family   Family understands the situation.  Family are satisfied with Plan of action and management. All questions answered  Additional CC time 25 mins   Cynthia [name] Cynthia [name], M.D.  [facility]  Medical Director [place] Director [facility] Cardio-Pulmonary Department

## 2022-12-20 NOTE — Progress Notes (Signed)
NAME:  Nihira Routt, MRN:  [id number], DOB:  25-Oct-1955, LOS: 4 ADMISSION DATE:  12/15/2022, CHIEF COMPLAINT:  Altered mental status   History of Present Illness:   Chaunda Wassink is a 67 y.o. female with medical history significant for bipolar disorder, sick sinus syndrome status post pacemaker, chronic hepatitis C, COPD, hypertension, TBI, seizure disorder, paroxysmal atrial fibrillation, CVA, CAD on Plavix, polysubstance abuse ( marijuana), who presents to the ED with c/o altered mental status.   Per ED reports, patient's husband reported to EMS that patient was having worsening fatigue and tremors that started around 7 PM last night.  He attempted to take her to urgent care but it was closed so he drove back home.  Due to worsening altered mental status EMS was called and on arrival patient was found to be drowsy with pinpoint pupils.  There was concerns that patient may have taken a dose of oxycodone at around 8 PM.     ED Course: Initial vital signs showed HR of 58 beats/minute, BP 146/55 mm Hg, the RR 15 breaths/minute, and the oxygen saturation 97% on  NRB and a temperature of 98.36F (36.9C). Patient was lethargic, moaned intermittently, and responded with one-word answers; symmetric movement in the arms and legs was observed.  She had received Narcan IV 0.5 mg and 1L bolus of NS.  Patient remained altered even with a second dose of IV Narcan.  On reassessment she was noted with gurgling respirations due to concerns for inability to protect her airway and pulmonary congestion she was intubated for airway protection.  PCCM consulted   Pertinent Labs/Diagnostics Findings: Na+/ K+: 133/4.0 glucose: 125 BUN/Cr.:  26/1.4 Unremarkable CBC Ethanol negative, ammonia negative, lithium elevated at 1.94 UDS positive for TCA and cannabinoid VBG :pO2 <31; pCO2 64; pH 7.25;  HCO3 28.1, %O2 Sat 42 point.  Imaging:CTH>Negative  Pertinent  Medical History  bipolar disorder, sick sinus syndrome status  post pacemaker, chronic hepatitis C, COPD, hypertension, TBI, seizure disorder, paroxysmal atrial fibrillation, CVA, CAD on Plavix, polysubstance abuse ( marijuana)  Significant Hospital Events: Including procedures, antibiotic start and stop dates in addition to other pertinent events   3/22: Admit to ICU with acute toxic metabolic encephalopathy secondary to suspected drug overdose (Lithium overdose) requiring mechanical ventilation 3/25 remains on Vent, severe encephalopathy 3/26 remains encepaholopathic  Interim History / Subjective:  Remains intubated Remains encephalopathic  I am concerned of residual brain injury from Li Toxicity EEG no seizures Less oral  secretions Minimal precedex Plan for trial of extubation Vent Mode: PRVC FiO2 (%):  [35 %-40 %] 35 % Set Rate:  [20 bmp] 20 bmp Vt Set:  [500 mL] 500 mL PEEP:  [5 cmH20] 5 cmH20 Plateau Pressure:  [22 cmH20-24 cmH20] 24 cmH20    Objective   Blood pressure (!) 117/56, pulse (!) 55, temperature 98.8 F (37.1 C), resp. rate 20, height 5\' 5"  (1.651 m), weight 68.7 kg, SpO2 100 %.    Vent Mode: PRVC FiO2 (%):  [35 %-40 %] 35 % Set Rate:  [20 bmp] 20 bmp Vt Set:  [500 mL] 500 mL PEEP:  [5 cmH20] 5 cmH20 Plateau Pressure:  [22 cmH20-24 cmH20] 24 cmH20   Intake/Output Summary (Last 24 hours) at 12/20/2022 0724 Last data filed at 12/20/2022 0600 Gross per 24 hour  Intake 4158.77 ml  Output 1230 ml  Net 2928.77 ml    Filed Weights   12/17/22 0707 12/18/22 0355 12/20/22 0500  Weight: 58.9 kg 63.4 kg 68.7 kg  REVIEW OF SYSTEMS  PATIENT IS UNABLE TO PROVIDE COMPLETE REVIEW OF SYSTEMS DUE TO SEVERE CRITICAL ILLNESS   PHYSICAL EXAMINATION:  GENERAL:critically ill appearing, +resp distress EYES: Pupils equal, round, reactive to light.  No scleral icterus.  MOUTH: Moist mucosal membrane. INTUBATED NECK: Supple.  PULMONARY: Lungs clear to auscultation, +rhonchi, CARDIOVASCULAR: S1 and S2.  Regular rate and  rhythm GASTROINTESTINAL: Soft, nontender, -distended. Positive bowel sounds.  MUSCULOSKELETAL: No swelling, clubbing, or edema.  NEUROLOGIC: sedated SKIN:normal, warm to touch, Capillary refill delayed  Pulses present bilaterally   Assessment & Plan:   67 year old female with a history of bipolar disorder, DDD pacemaker, COPD, seizure disorder, Afib, CVA and CAD who presents to the hospital with altered mental status with severe metabolic encephalopathy She was found to have Lithium toxicity and was intubated for airway protection highly suggestive of aspiration pneumonitis.  Severe ACUTE Hypoxic and Hypercapnic Respiratory Failure -continue Mechanical Ventilator support -Wean Fio2 and PEEP as tolerated -VAP/VENT bundle implementation - Wean PEEP & FiO2 as tolerated, maintain SpO2 > 88% - Head of bed elevated 30 degrees, VAP protocol in place - Plateau pressures less than 30 cm H20  - Intermittent chest x-ray & ABG PRN - Ensure adequate pulmonary hygiene  -will perform SAT/SBT when respiratory parameters are met    NEUROLOGY ACUTE METABOLIC ENCEPHALOPATHY due to Nicoletta Dress toxicity MRI-unable to obtain Neuro consultation pending Precedex as needed   Cardiovascular CAD Afib SSS s/p Pacemaker -peripheral nor-epi for goal MAP > 65    ENDO - ICU hypoglycemic\Hyperglycemia protocol -check FSBS per protocol   GI GI PROPHYLAXIS as indicated  NUTRITIONAL STATUS DIET-->Place NGT Constipation protocol as indicated   ELECTROLYTES -follow labs as needed -replace as needed -pharmacy consultation and following  INFECTIOUS DISEASE -continue antibiotics as prescribed for aspiration -follow up cultures    Best Practice (right click and "Reselect all SmartList Selections" daily)   Diet/type: tubefeeds DVT prophylaxis: LMWH GI prophylaxis: H2B Lines: N/A Foley:  Yes, and it is still needed Code Status:  full code   Labs   CBC: Recent Labs  Lab 12/15/22 2148  12/16/22 1457 12/17/22 0356 12/18/22 0429 12/19/22 0328 12/20/22 0408  WBC 7.4 10.9* 12.8* 9.0 5.4 4.7  NEUTROABS 4.9  --   --   --   --   --   HGB 13.0 12.5 12.3 11.5* 10.4* 10.3*  HCT 41.6 41.4 40.4 37.7 34.2* 34.3*  MCV 99.3 103.2* 100.7* 101.9* 103.0* 103.6*  PLT 171 177 206 131* 96* 117*     Basic Metabolic Panel: Recent Labs  Lab 12/16/22 0747 12/16/22 1457 12/17/22 0356 12/18/22 0429 12/19/22 0328 12/20/22 0408  NA 138 139 139 142 139 139  K 4.3 4.3 4.2 3.7 4.4 4.2  CL 115* 117* 117* 116* 116* 115*  CO2 22 17* 17* 18* 18* 21*  GLUCOSE 107* 89 162* 183* 202* 177*  BUN 19 16 13 11 13 17   CREATININE 1.32* 1.15* 1.08* 1.01* 0.92 0.85  CALCIUM 9.1 8.8* 8.8* 8.8* 8.7* 8.6*  MG 2.1  --  1.8  --  1.9 1.9  PHOS 3.3  --  3.2 2.9 3.4 3.6    GFR: Estimated Creatinine Clearance: 62.6 mL/min (by C-G formula based on SCr of 0.85 mg/dL). Recent Labs  Lab 12/16/22 1000 12/16/22 1233 12/16/22 1457 12/17/22 0356 12/17/22 1001 12/18/22 0429 12/19/22 0328 12/20/22 0408  WBC  --   --    < > 12.8*  --  9.0 5.4 4.7  LATICACIDVEN 2.0* 1.7  --   --  0.9  --   --   --    < > = values in this interval not displayed.     Liver Function Tests: Recent Labs  Lab 12/17/22 0356 12/18/22 0429 12/19/22 0328 12/20/22 0408  ALBUMIN 3.0* 2.4* 2.5* 2.4*    No results for input(s): "LIPASE", "AMYLASE" in the last 168 hours. Recent Labs  Lab 12/16/22 0038  AMMONIA 26     ABG    Component Value Date/Time   PHART 7.27 (L) 12/17/2022 0837   PCO2ART 38 12/17/2022 0837   PO2ART 81 (L) 12/17/2022 0837   HCO3 20.2 12/18/2022 0855   ACIDBASEDEF 6.4 (H) 12/18/2022 0855   O2SAT 51.1 12/18/2022 0855     Coagulation Profile: No results for input(s): "INR", "PROTIME" in the last 168 hours.  Cardiac Enzymes: No results for input(s): "CKTOTAL", "CKMB", "CKMBINDEX", "TROPONINI" in the last 168 hours.  HbA1C: Hemoglobin A1C  Date/Time Value Ref Range Status  12/10/2021 12:00  AM 5.2  Final  09/22/2020 12:00 AM 5.1  Final   Hgb A1c MFr Bld  Date/Time Value Ref Range Status  12/06/2022 11:17 AM 5.1 <5.7 % of total Hgb Final    Comment:    For the purpose of screening for the presence of diabetes: . <5.7%       Consistent with the absence of diabetes 5.7-6.4%    Consistent with increased risk for diabetes             (prediabetes) > or =6.5%  Consistent with diabetes . This assay result is consistent with a decreased risk of diabetes. . Currently, no consensus exists regarding use of hemoglobin A1c for diagnosis of diabetes in children. . According to American Diabetes Association (ADA) guidelines, hemoglobin A1c <7.0% represents optimal control in non-pregnant diabetic patients. Different metrics may apply to specific patient populations.  Standards of Medical Care in Diabetes(ADA). .     CBG: Recent Labs  Lab 12/19/22 1131 12/19/22 1736 12/19/22 1914 12/19/22 2306 12/20/22 0616  GLUCAP 181* 172* 157* 166* 222*      DVT/GI PRX  assessed I Assessed the need for Labs I Assessed the need for Foley I Assessed the need for Central Venous Line Family Discussion when available I Assessed the need for Mobilization I made an Assessment of medications to be adjusted accordingly Safety Risk assessment completed  CASE DISCUSSED IN MULTIDISCIPLINARY ROUNDS WITH ICU TEAM     Critical Care Time devoted to patient care services described in this note is 60 minutes.  Critical care was necessary to treat /prevent imminent and life-threatening deterioration. Overall, patient is critically ill, prognosis is guarded.  Patient with Multiorgan failure and at high risk for cardiac arrest and death.    Corrin Parker, M.D.  [facility]  Medical Director [place] Director [facility] Cardio-Pulmonary Department

## 2022-12-21 DIAGNOSIS — R4182 Altered mental status, unspecified: Secondary | ICD-10-CM | POA: Diagnosis not present

## 2022-12-21 DIAGNOSIS — T56891A Toxic effect of other metals, accidental (unintentional), initial encounter: Secondary | ICD-10-CM | POA: Diagnosis not present

## 2022-12-21 DIAGNOSIS — F17210 Nicotine dependence, cigarettes, uncomplicated: Secondary | ICD-10-CM | POA: Diagnosis not present

## 2022-12-21 LAB — GLUCOSE, CAPILLARY
Glucose-Capillary: 147 mg/dL — ABNORMAL HIGH (ref 70–99)
Glucose-Capillary: 160 mg/dL — ABNORMAL HIGH (ref 70–99)
Glucose-Capillary: 161 mg/dL — ABNORMAL HIGH (ref 70–99)
Glucose-Capillary: 171 mg/dL — ABNORMAL HIGH (ref 70–99)
Glucose-Capillary: 184 mg/dL — ABNORMAL HIGH (ref 70–99)
Glucose-Capillary: 199 mg/dL — ABNORMAL HIGH (ref 70–99)
Glucose-Capillary: 204 mg/dL — ABNORMAL HIGH (ref 70–99)

## 2022-12-21 LAB — RENAL FUNCTION PANEL
Albumin: 3.3 g/dL — ABNORMAL LOW (ref 3.5–5.0)
Anion gap: 7 (ref 5–15)
BUN: 27 mg/dL — ABNORMAL HIGH (ref 8–23)
CO2: 23 mmol/L (ref 22–32)
Calcium: 9.6 mg/dL (ref 8.9–10.3)
Chloride: 111 mmol/L (ref 98–111)
Creatinine, Ser: 1 mg/dL (ref 0.44–1.00)
GFR, Estimated: >60 mL/min (ref >60)
Glucose, Bld: 178 mg/dL — ABNORMAL HIGH (ref 70–99)
Phosphorus: 3.8 mg/dL (ref 2.5–4.6)
Potassium: 4.5 mmol/L (ref 3.5–5.1)
Sodium: 141 mmol/L (ref 135–145)

## 2022-12-21 LAB — CBC
HCT: 34.1 % — ABNORMAL LOW (ref 36.0–46.0)
Hemoglobin: 10.4 g/dL — ABNORMAL LOW (ref 12.0–15.0)
MCH: 30.8 pg (ref 26.0–34.0)
MCHC: 30.5 g/dL (ref 30.0–36.0)
MCV: 100.9 fL — ABNORMAL HIGH (ref 80.0–100.0)
Platelets: 134 10*3/uL — ABNORMAL LOW (ref 150–400)
RBC: 3.38 MIL/uL — ABNORMAL LOW (ref 3.87–5.11)
RDW: 13.3 % (ref 11.5–15.5)
WBC: 4.8 10*3/uL (ref 4.0–10.5)
nRBC: 0 % (ref 0.0–0.2)

## 2022-12-21 LAB — MAGNESIUM: Magnesium: 2.1 mg/dL (ref 1.7–2.4)

## 2022-12-21 MED ORDER — ORAL CARE MOUTH RINSE: 15.0000 mL | OROMUCOSAL | Status: DC | PRN

## 2022-12-21 MED ORDER — FLUCONAZOLE IN SODIUM CHLORIDE 200-0.9 MG/100ML-% IV SOLN
200.0000 mg | Freq: Once | INTRAVENOUS | Status: AC
  Administered 2022-12-21: 200 mg via INTRAVENOUS
  Filled 2022-12-21: qty 100

## 2022-12-21 MED ORDER — HYDRALAZINE HCL 20 MG/ML IJ SOLN
10.0000 mg | INTRAMUSCULAR | Status: DC | PRN
  Administered 2022-12-22: 10 mg via INTRAVENOUS
  Filled 2022-12-21: qty 1

## 2022-12-21 MED ORDER — OSMOLITE 1.2 CAL PO LIQD
1000.0000 mL | ORAL | Status: DC
  Administered 2022-12-21: 1000 mL

## 2022-12-21 MED ORDER — FREE WATER
30.0000 mL | Status: DC
  Administered 2022-12-21: 30 mL

## 2022-12-21 MED ORDER — OSMOLITE 1.2 CAL PO LIQD: 1000.0000 mL | ORAL | Status: DC

## 2022-12-21 MED ORDER — ORAL CARE MOUTH RINSE
15.0000 mL | OROMUCOSAL | Status: DC
  Administered 2022-12-21 – 2022-12-23 (×7): 15 mL via OROMUCOSAL

## 2022-12-21 MED ORDER — PANTOPRAZOLE SODIUM 40 MG IV SOLR
40.0000 mg | Freq: Every day | INTRAVENOUS | Status: DC
  Administered 2022-12-21 – 2022-12-22 (×2): 40 mg via INTRAVENOUS
  Filled 2022-12-21 (×2): qty 10

## 2022-12-21 NOTE — Telephone Encounter (Signed)
I saw and reviewed ER and admission notes and what was happening  I will get an alert when she is discharged and review everything again and they would then come for HFU OV

## 2022-12-21 NOTE — Procedures (Signed)
Extubation Procedure Note  Patient Details:   Name: Cynthia [name] DOB: 11/26/1955 MRN: [id number]   Airway Documentation:    Vent end date: 12/21/22 Vent end time: 0800   Evaluation  O2 sats: stable throughout Complications: No apparent complications Patient did tolerate procedure well. Bilateral Breath Sounds: Rhonchi   Yes able to cough, but does not speak or follow commands.  Placed on 4lpm.  Will continue to monitor  Marlane Mingle 12/21/2022, 8:08 AM

## 2022-12-21 NOTE — Evaluation (Signed)
Physical Therapy Evaluation Patient Details Name: Cynthia [name] MRN: [id number] DOB: 1956-08-10 Today's Date: 12/21/2022  History of Present Illness  Pt admitted for AMS with history including bipolar, pacemaker, COPD, HTN, TBI, seizures, and polysubstance abuse. Pt intubated 3/22-3/27.  Clinical Impression  Pt is a pleasantly confused 67 year old female who was admitted for AMS. Pt performs bed mobility with min assist with poor tolerance this date. Pt is very fidgety and restless in bed. Heavy secretions noted with productive cough in seated position.  Pt demonstrates deficits with strength/mobility/cognition. Would benefit from skilled PT to address above deficits and promote optimal return to PLOF. Recommend continued therapy services post hospital discharge. Will defer disposition to treatment team and TOC.      Recommendations for follow up therapy are one component of a multi-disciplinary discharge planning process, led by the attending physician.  Recommendations may be updated based on patient status, additional functional criteria and insurance authorization.  Follow Up Recommendations Can patient physically be transported by private vehicle: No     Assistance Recommended at Discharge Frequent or constant Supervision/Assistance  Patient can return home with the following  A lot of help with walking and/or transfers;A lot of help with bathing/dressing/bathroom;Help with stairs or ramp for entrance    Equipment Recommendations  (TBD)  Recommendations for Other Services       Functional Status Assessment Patient has had a recent decline in their functional status and demonstrates the ability to make significant improvements in function in a reasonable and predictable amount of time.     Precautions / Restrictions Precautions Precautions: Fall Restrictions Weight Bearing Restrictions: No      Mobility  Bed Mobility Overal bed mobility: Needs Assistance Bed Mobility:  Supine to Sit, Sit to Supine     Supine to sit: Min assist Sit to supine: Min assist   General bed mobility comments: once seated at EOB, poor trunkal control noted with sway and constant min assist to maintain upright posture. Poor tolerance and requested to return back to bed. All mobility performed on 2L of O2    Transfers                   General transfer comment: not able to tolerate    Ambulation/Gait                  Stairs            Wheelchair Mobility    Modified Rankin (Stroke Patients Only)       Balance Overall balance assessment: Needs assistance Sitting-balance support: Feet supported Sitting balance-Leahy Scale: Poor                                       Pertinent Vitals/Pain Pain Assessment Pain Assessment: No/denies pain    Home Living Family/patient expects to be discharged to:: Private residence Living Arrangements: Spouse/significant other Available Help at Discharge: Family;Available 24 hours/day Type of Home: House Home Access: Stairs to enter Entrance Stairs-Rails: None Entrance Stairs-Number of Steps: 3   Home Layout: One level Home Equipment: Conservation officer, nature (2 wheels) Additional Comments: pt is unreliable historian, unsure of accuracy of information.    Prior Function Prior Level of Function : Needs assist;History of Falls (last six months);Patient poor historian/Family not available       Physical Assist : Mobility (physical)     Mobility Comments: reports she uses walker  at baseline and has had multiple falls ADLs Comments: reports husband assist with all ADLs     Hand Dominance        Extremity/Trunk Assessment   Upper Extremity Assessment Upper Extremity Assessment: Generalized weakness (B UE 4/5 grip, 3+/5 elbow flex/ext)    Lower Extremity Assessment Lower Extremity Assessment: Generalized weakness (B LE grossly 3/5)       Communication   Communication: No difficulties   Cognition Arousal/Alertness: Awake/alert Behavior During Therapy: Restless Overall Cognitive Status: Within Functional Limits for tasks assessed                                 General Comments: pleasant, however very restless in bed despite repositioning. Confused to place/situation        General Comments      Exercises Other Exercises Other Exercises: supine ther-ex performed on B LE including marching and SLRs. Poor attention to task, needs cues for facilitation. 10 reps   Assessment/Plan    PT Assessment Patient needs continued PT services  PT Problem List Decreased strength;Decreased activity tolerance;Decreased balance;Decreased mobility       PT Treatment Interventions Gait training;DME instruction;Therapeutic activities;Therapeutic exercise;Balance training    PT Goals (Current goals can be found in the Care Plan section)  Acute Rehab PT Goals Patient Stated Goal: to get stronger PT Goal Formulation: With patient Time For Goal Achievement: 01/04/23 Potential to Achieve Goals: Good    Frequency Min 2X/week     Co-evaluation               AM-PAC PT "6 Clicks" Mobility  Outcome Measure Help needed turning from your back to your side while in a flat bed without using bedrails?: A Little Help needed moving from lying on your back to sitting on the side of a flat bed without using bedrails?: A Little Help needed moving to and from a bed to a chair (including a wheelchair)?: A Lot Help needed standing up from a chair using your arms (e.g., wheelchair or bedside chair)?: A Lot Help needed to walk in hospital room?: A Lot Help needed climbing 3-5 steps with a railing? : Total 6 Click Score: 13    End of Session Equipment Utilized During Treatment: Oxygen Activity Tolerance: Patient tolerated treatment well Patient left: in bed Nurse Communication: Mobility status PT Visit Diagnosis: Unsteadiness on feet (R26.81);Muscle weakness (generalized)  (M62.81);Difficulty in walking, not elsewhere classified (R26.2)    Time: [id number] PT Time Calculation (min) (ACUTE ONLY): 13 min   Charges:   PT Evaluation $PT Eval Low Complexity: 1 Low          Greggory Stallion, PT, DPT, GCS [phone]   Cynthia [name] 12/21/2022, 4:19 PM

## 2022-12-21 NOTE — Consult Note (Signed)
[place] for Electrolyte Monitoring and Replacement   Recent Labs: Potassium (mmol/L)  Date Value  12/21/2022 4.5   Magnesium (mg/dL)  Date Value  12/21/2022 2.1   Calcium (mg/dL)  Date Value  12/21/2022 9.6   Albumin (g/dL)  Date Value  12/21/2022 3.3 (L)  06/25/2019 4.5   Phosphorus (mg/dL)  Date Value  12/21/2022 3.8   Sodium  Date Value  12/21/2022 141 mmol/L  12/10/2021 154 (A)   Assessment: 67 yo F with pertinent PMH pAF not on AC d/t fall risk presents with AMS and tremors ISO lithium toxicity and c/f polypharmacy. Pharmacy consulted to manage electrolytes while patient in the CCU.  Intubated, sedated on mechanical ventilation Nutrition: Tube feeds started 3/22 afternoon MIVF: NS at 150 cc/hr - stopped. > LR @ 100 ml/hr.  NE at 2 mcg/min  Goal of Therapy:  K >/= 4.0 and Mg >/= 2.0  Plan:  --No electrolyte replacement currently warranted. --f/u with AM labs.   Pearla Dubonnet, PharmD 12/21/2022 9:39 AM

## 2022-12-21 NOTE — Progress Notes (Signed)
SLP Cancellation Note  Patient Details Name: Cynthia [name] MRN: [id number] DOB: 1956/07/22   Cancelled treatment:       Reason Eval/Treat Not Completed: Medical issues which prohibited therapy;Patient not medically ready (chart reviewed; consulted NSG/MD re: pt's status today)  Per chart, pt was extubated this morning at ~8am; ~6 hours ago post being orally intubated on vent support for ~5 days. Pt has a h/o Bipolar Dis on several Psychiatric medications and was admitted w/ lithium toxicity and worsening functioning per Husband.  Also noted h/o TBI and memory deficits w/ confusion.   In the setting of lengthy oral intubation and Baseline Comorbidities/TBI/Confusion, recommend pt remain NPO today d/t increased for aspiration. ST services will f/u w/ swallow assessment tomorrow (post 24 hours extubation) and when pt can be fully awake/alert. MD agreed.  Pt was previously recommended to be on a dysphagia level 2 w/ thins per ST services 03/2020.     Cynthia Kenner, MS, CCC-SLP Speech Language Pathologist Rehab Services; [facility] [phone] (ascom) Reginia Battie 12/21/2022, 2:21 PM

## 2022-12-21 NOTE — TOC Progression Note (Signed)
Transition of Care [facility]) - Progression Note    Patient Details  Name: Cynthia [name] MRN: [id number] Date of Birth: [birth year]  Transition of Care [facility] CM/SW [place], [place] Phone Number: 12/21/2022, 11:14 AM  Clinical Narrative:     TOC notes patient is from home with husband, presented to ER with AMS, tremors. Intubated on 3/22. Extubated today 3/27.  PCP: Delsa Grana, PA   TOC will follow for dispo planning needs pending medical improvements.  Expected Discharge Plan:  (TBD) Barriers to Discharge: Continued Medical Work up  Expected Discharge Plan and Services       Living arrangements for the past 2 months: Single Family Home                                       Social Determinants of Health (SDOH) Interventions SDOH Screenings   Food Insecurity: No Food Insecurity (06/21/2022)  Housing: Low Risk  (06/21/2022)  Transportation Needs: No Transportation Needs (06/21/2022)  Utilities: Not At Risk (06/21/2022)  Alcohol Screen: Low Risk  (09/20/2022)  Depression (PHQ2-9): High Risk (12/06/2022)  Financial Resource Strain: Low Risk  (06/21/2022)  Physical Activity: Insufficiently Active (06/21/2022)  Social Connections: Moderately Isolated (06/21/2022)  Stress: No Stress Concern Present (06/21/2022)  Tobacco Use: High Risk (12/06/2022)    Readmission Risk Interventions     No data to display

## 2022-12-21 NOTE — Progress Notes (Signed)
Nutrition Follow Up Note   DOCUMENTATION CODES:   Non-severe (moderate) malnutrition in context of chronic illness  INTERVENTION:   Change to Osmolite 1.2@60ml /hr continuous   Free water flushes 9ml q4 hours to maintain tube patency   Regimen provides 1728kcal/day, 80g/day protein and 1353ml/day of fluid.   NUTRITION DIAGNOSIS:   Moderate Malnutrition related to chronic illness (COPD, cirrhosis) as evidenced by mild fat depletion, moderate fat depletion, moderate muscle depletion. -ongoing   GOAL:   Patient will meet greater than or equal to 90% of their needs -met with tube feeds  MONITOR:   Diet advancement, Labs, Weight trends, TF tolerance, I & O's, Skin  ASSESSMENT:   67 y/o female with h/o HTN, TBI, COPD, CAD, cirrhosis, bipolar disorder, pacemaker, anxiety, PAD, hep C, PAF, sick sinus syndrome, CHF, seizures, marijuana use and tremors who is admitted with lithium toxicity, AMS and AKI.  Pt extubated this morning. NGT remains in place. SLP evaluation pending. Will plan to continue tube feeds via NGT until pt is able to be placed on an oral diet and eat a substantial amount. Pt with thrush noted. Per chart, pt noted to be up ~30lbs from her admit weight yesterday. Pt received lasix yesterday with good UOP overnight. Pt has not been weighed today. Pt remains +8.2L on her I & Os.   Medications reviewed and include: lovenox, pepcid, insulin, diflucan, levophed, zosyn  Labs reviewed: K 4.5 wnl, BUN 27(H), P 3.8 wnl, Mg 2.1 wnl Hgb 10.4(L), Hct 34.1(L) Cbgs- 160, 204, 199 x 24 hrs  UOP- 3020ml  Diet Order:    Diet Order             Diet NPO time specified  Diet effective now                  EDUCATION NEEDS:   No education needs have been identified at this time  Skin:  Skin Assessment: Reviewed RN Assessment (ecchymosis)  Last BM:  3/27- 244ml via rectal tube  Height:   Ht Readings from Last 1 Encounters:  12/15/22 5\' 5"  (1.651 m)    Weight:    Wt Readings from Last 1 Encounters:  12/20/22 68.7 kg    Ideal Body Weight:  56.8 kg  BMI:  Body mass index is 25.2 kg/m.  Estimated Nutritional Needs:   Kcal:  1500-1700kcal/day  Protein:  75-85g/day  Fluid:  1.4-1.6L/day  Koleen Distance MS, RD, LDN Please refer to [facility] for RD and/or RD on-call/weekend/after hours pager

## 2022-12-21 NOTE — Progress Notes (Signed)
NAME:  Cynthia [name], MRN:  [id number], DOB:  14-May-1956, LOS: 5 ADMISSION DATE:  12/15/2022, CHIEF COMPLAINT:  Altered mental status   History of Present Illness:   Cynthia [name] is a 67 y.o. female with medical history significant for bipolar disorder, sick sinus syndrome status post pacemaker, chronic hepatitis C, COPD, hypertension, TBI, seizure disorder, paroxysmal atrial fibrillation, CVA, CAD on Plavix, polysubstance abuse ( marijuana), who presents to the ED with c/o altered mental status.   Per ED reports, patient's husband reported to EMS that patient was having worsening fatigue and tremors that started around 7 PM last night.  He attempted to take her to urgent care but it was closed so he drove back home.  Due to worsening altered mental status EMS was called and on arrival patient was found to be drowsy with pinpoint pupils.  There was concerns that patient may have taken a dose of oxycodone at around 8 PM.     ED Course: Initial vital signs showed HR of 58 beats/minute, BP 146/55 mm Hg, the RR 15 breaths/minute, and the oxygen saturation 97% on  NRB and a temperature of 98.28F (36.9C). Patient was lethargic, moaned intermittently, and responded with one-word answers; symmetric movement in the arms and legs was observed.  She had received Narcan IV 0.5 mg and 1L bolus of NS.  Patient remained altered even with a second dose of IV Narcan.  On reassessment she was noted with gurgling respirations due to concerns for inability to protect her airway and pulmonary congestion she was intubated for airway protection.  PCCM consulted   Pertinent Labs/Diagnostics Findings: Na+/ K+: 133/4.0 glucose: 125 BUN/Cr.:  26/1.4 Unremarkable CBC Ethanol negative, ammonia negative, lithium elevated at 1.94 UDS positive for TCA and cannabinoid VBG :pO2 <31; pCO2 64; pH 7.25;  HCO3 28.1, %O2 Sat 42 point.  Imaging:CTH>Negative  Pertinent  Medical History  bipolar disorder, sick sinus syndrome status  post pacemaker, chronic hepatitis C, COPD, hypertension, TBI, seizure disorder, paroxysmal atrial fibrillation, CVA, CAD on Plavix, polysubstance abuse ( marijuana)  Significant Hospital Events: Including procedures, antibiotic start and stop dates in addition to other pertinent events   3/22: Admit to ICU with acute toxic metabolic encephalopathy secondary to suspected drug overdose (Lithium overdose) requiring mechanical ventilation 3/25 remains on Vent, severe encephalopathy 3/26 remains encephalopathic, started steroids for tongue swelling  Interim History / Subjective:  Remains intubated Encephalopathy seems to be improving residual brain injury from Li Toxicity EEG no seizures Less oral  secretions Minimal precedex Plan for trial of extubation today when family arrives Vent Mode: PRVC FiO2 (%):  [30 %-35 %] 30 % Set Rate:  [20 bmp] 20 bmp Vt Set:  [500 mL] 500 mL PEEP:  [5 cmH20] 5 cmH20 Plateau Pressure:  [20 cmH20] 20 cmH20    Objective   Blood pressure (!) 167/71, pulse (!) 55, temperature 99.5 F (37.5 C), resp. rate 18, height 5\' 5"  (1.651 m), weight 68.7 kg, SpO2 99 %.    Vent Mode: PRVC FiO2 (%):  [30 %-35 %] 30 % Set Rate:  [20 bmp] 20 bmp Vt Set:  [500 mL] 500 mL PEEP:  [5 cmH20] 5 cmH20 Plateau Pressure:  [20 cmH20] 20 cmH20   Intake/Output Summary (Last 24 hours) at 12/21/2022 0717 Last data filed at 12/21/2022 0700 Gross per 24 hour  Intake 1493.82 ml  Output 3280 ml  Net -1786.18 ml    Filed Weights   12/17/22 0707 12/18/22 0355 12/20/22 0500  Weight: 58.9 kg  63.4 kg 68.7 kg     REVIEW OF SYSTEMS  PATIENT IS UNABLE TO PROVIDE COMPLETE REVIEW OF SYSTEMS DUE TO SEVERE CRITICAL ILLNESS   PHYSICAL EXAMINATION:  GENERAL:critically ill appearing, +resp distress EYES: Pupils equal, round, reactive to light.  No scleral icterus.  MOUTH: Moist mucosal membrane. INTUBATED NECK: Supple.  PULMONARY: Lungs clear to auscultation, +rhonchi,   CARDIOVASCULAR: S1 and S2.  Regular rate and rhythm GASTROINTESTINAL: Soft, nontender, -distended. Positive bowel sounds.  MUSCULOSKELETAL: No swelling, clubbing, or edema.  NEUROLOGIC: obtunded,sedated SKIN:normal, warm to touch, Capillary refill delayed  Pulses present bilaterally  Assessment & Plan:   67 year old female with a history of bipolar disorder, DDD pacemaker, COPD, seizure disorder, Afib, CVA and CAD who presents to the hospital with altered mental status with severe metabolic encephalopathy She was found to have Lithium toxicity and was intubated for airway protection highly suggestive of aspiration pneumonitis.  Severe ACUTE Hypoxic and Hypercapnic Respiratory Failure -continue Mechanical Ventilator support -Wean Fio2 and PEEP as tolerated -VAP/VENT bundle implementation - Wean PEEP & FiO2 as tolerated, maintain SpO2 > 88% - Head of bed elevated 30 degrees, VAP protocol in place - Plateau pressures less than 30 cm H20  - Intermittent chest x-ray & ABG PRN - Ensure adequate pulmonary hygiene  -will perform SAT/SBT when respiratory parameters are met   NEUROLOGY ACUTE METABOLIC ENCEPHALOPATHY due to Nicoletta Dress toxicity MRI-unable to obtain Precedex as needed   ENDO - ICU hypoglycemic\Hyperglycemia protocol -check FSBS per protocol   GI GI PROPHYLAXIS as indicated  NUTRITIONAL STATUS DIET-->TF's as tolerated Constipation protocol as indicated   ELECTROLYTES -follow labs as needed -replace as needed -pharmacy consultation and following ELECTROLYTES  INFECTIOUS DISEASE Infected PANUS -continue antibiotics as prescribed  -follow up cultures    Best Practice (right click and "Reselect all SmartList Selections" daily)   Diet/type: tubefeeds DVT prophylaxis: LMWH GI prophylaxis: H2B Lines: N/A Foley:  Yes, and it is still needed Code Status:  full code   Labs   CBC: Recent Labs  Lab 12/15/22 2148 12/16/22 1457 12/17/22 0356 12/18/22 0429  12/19/22 0328 12/20/22 0408  WBC 7.4 10.9* 12.8* 9.0 5.4 4.7  NEUTROABS 4.9  --   --   --   --   --   HGB 13.0 12.5 12.3 11.5* 10.4* 10.3*  HCT 41.6 41.4 40.4 37.7 34.2* 34.3*  MCV 99.3 103.2* 100.7* 101.9* 103.0* 103.6*  PLT 171 177 206 131* 96* 117*     Basic Metabolic Panel: Recent Labs  Lab 12/16/22 0747 12/16/22 1457 12/17/22 0356 12/18/22 0429 12/19/22 0328 12/20/22 0408  NA 138 139 139 142 139 139  K 4.3 4.3 4.2 3.7 4.4 4.2  CL 115* 117* 117* 116* 116* 115*  CO2 22 17* 17* 18* 18* 21*  GLUCOSE 107* 89 162* 183* 202* 177*  BUN 19 16 13 11 13 17   CREATININE 1.32* 1.15* 1.08* 1.01* 0.92 0.85  CALCIUM 9.1 8.8* 8.8* 8.8* 8.7* 8.6*  MG 2.1  --  1.8  --  1.9 1.9  PHOS 3.3  --  3.2 2.9 3.4 3.6    GFR: Estimated Creatinine Clearance: 62.6 mL/min (by C-G formula based on SCr of 0.85 mg/dL). Recent Labs  Lab 12/16/22 1000 12/16/22 1233 12/16/22 1457 12/17/22 0356 12/17/22 1001 12/18/22 0429 12/19/22 0328 12/20/22 0408  WBC  --   --    < > 12.8*  --  9.0 5.4 4.7  LATICACIDVEN 2.0* 1.7  --   --  0.9  --   --   --    < > =  values in this interval not displayed.     Liver Function Tests: Recent Labs  Lab 12/17/22 0356 12/18/22 0429 12/19/22 0328 12/20/22 0408  ALBUMIN 3.0* 2.4* 2.5* 2.4*    No results for input(s): "LIPASE", "AMYLASE" in the last 168 hours. Recent Labs  Lab 12/16/22 0038  AMMONIA 26     ABG    Component Value Date/Time   PHART 7.27 (L) 12/17/2022 0837   PCO2ART 38 12/17/2022 0837   PO2ART 81 (L) 12/17/2022 0837   HCO3 20.2 12/18/2022 0855   ACIDBASEDEF 6.4 (H) 12/18/2022 0855   O2SAT 51.1 12/18/2022 0855      DVT/GI PRX  assessed I Assessed the need for Labs I Assessed the need for Foley I Assessed the need for Central Venous Line Family Discussion when available I Assessed the need for Mobilization I made an Assessment of medications to be adjusted accordingly Safety Risk assessment completed  CASE DISCUSSED IN  MULTIDISCIPLINARY ROUNDS WITH ICU TEAM     Critical Care Time devoted to patient care services described in this note is 55 minutes.  Critical care was necessary to treat /prevent imminent and life-threatening deterioration. Overall, patient is critically ill, prognosis is guarded.  Patient with Multiorgan failure and at high risk for cardiac arrest and death.    Corrin Parker, M.D.  [facility]  Medical Director [place] Director [facility] Cardio-Pulmonary Department

## 2022-12-22 ENCOUNTER — Inpatient Hospital Stay: Payer: 59

## 2022-12-22 DIAGNOSIS — T56891A Toxic effect of other metals, accidental (unintentional), initial encounter: Secondary | ICD-10-CM | POA: Diagnosis not present

## 2022-12-22 LAB — GLUCOSE, CAPILLARY
Glucose-Capillary: 92 mg/dL (ref 70–99)
Glucose-Capillary: 130 mg/dL — ABNORMAL HIGH (ref 70–99)
Glucose-Capillary: 116 mg/dL — ABNORMAL HIGH (ref 70–99)
Glucose-Capillary: 140 mg/dL — ABNORMAL HIGH (ref 70–99)
Glucose-Capillary: 125 mg/dL — ABNORMAL HIGH (ref 70–99)

## 2022-12-22 LAB — CBC
HCT: 33.4 % — ABNORMAL LOW (ref 36.0–46.0)
Hemoglobin: 9.9 g/dL — ABNORMAL LOW (ref 12.0–15.0)
MCH: 30.9 pg (ref 26.0–34.0)
MCHC: 29.6 g/dL — ABNORMAL LOW (ref 30.0–36.0)
MCV: 104.4 fL — ABNORMAL HIGH (ref 80.0–100.0)
Platelets: 160 10*3/uL (ref 150–400)
RBC: 3.2 MIL/uL — ABNORMAL LOW (ref 3.87–5.11)
RDW: 13.7 % (ref 11.5–15.5)
WBC: 7.4 10*3/uL (ref 4.0–10.5)
nRBC: 0 % (ref 0.0–0.2)

## 2022-12-22 LAB — BASIC METABOLIC PANEL
Anion gap: 10 (ref 5–15)
BUN: 29 mg/dL — ABNORMAL HIGH (ref 8–23)
CO2: 23 mmol/L (ref 22–32)
Calcium: 9.3 mg/dL (ref 8.9–10.3)
Chloride: 112 mmol/L — ABNORMAL HIGH (ref 98–111)
Creatinine, Ser: 0.93 mg/dL (ref 0.44–1.00)
GFR, Estimated: >60 mL/min (ref >60)
Glucose, Bld: 156 mg/dL — ABNORMAL HIGH (ref 70–99)
Potassium: 4.1 mmol/L (ref 3.5–5.1)
Sodium: 145 mmol/L (ref 135–145)

## 2022-12-22 LAB — MAGNESIUM: Magnesium: 2 mg/dL (ref 1.7–2.4)

## 2022-12-22 LAB — PHOSPHORUS: Phosphorus: 4 mg/dL (ref 2.5–4.6)

## 2022-12-22 MED ORDER — ASPIRIN 81 MG PO TBEC
81.0000 mg | DELAYED_RELEASE_TABLET | Freq: Every day | ORAL | Status: DC
  Administered 2022-12-22 – 2022-12-26 (×5): 81 mg via ORAL
  Filled 2022-12-22 (×5): qty 1

## 2022-12-22 MED ORDER — ADULT MULTIVITAMIN W/MINERALS CH
1.0000 | ORAL_TABLET | Freq: Every day | ORAL | Status: DC
  Administered 2022-12-23 – 2022-12-26 (×3): 1 via ORAL
  Filled 2022-12-22 (×3): qty 1

## 2022-12-22 MED ORDER — NEPRO/CARBSTEADY PO LIQD
237.0000 mL | Freq: Three times a day (TID) | ORAL | Status: DC
  Administered 2022-12-22 – 2022-12-26 (×3): 237 mL via ORAL

## 2022-12-22 MED ORDER — ATORVASTATIN CALCIUM 20 MG PO TABS
20.0000 mg | ORAL_TABLET | Freq: Every day | ORAL | Status: DC
  Administered 2022-12-22 – 2022-12-25 (×4): 20 mg via ORAL
  Filled 2022-12-22 (×4): qty 1

## 2022-12-22 MED ORDER — UMECLIDINIUM BROMIDE 62.5 MCG/ACT IN AEPB
1.0000 | INHALATION_SPRAY | Freq: Every day | RESPIRATORY_TRACT | Status: DC
  Administered 2022-12-22 – 2022-12-26 (×4): 1 via RESPIRATORY_TRACT
  Filled 2022-12-22 (×2): qty 7

## 2022-12-22 MED ORDER — ONDANSETRON HCL 4 MG/2ML IJ SOLN
INTRAMUSCULAR | Status: AC
  Administered 2022-12-22: 4 mg via INTRAVENOUS
  Filled 2022-12-22: qty 2

## 2022-12-22 MED ORDER — AMLODIPINE BESYLATE 10 MG PO TABS
10.0000 mg | ORAL_TABLET | Freq: Every day | ORAL | Status: DC
  Administered 2022-12-22 – 2022-12-26 (×5): 10 mg via ORAL
  Filled 2022-12-22 (×5): qty 1

## 2022-12-22 MED ORDER — ONDANSETRON HCL 4 MG/2ML IJ SOLN: 4.0000 mg | Freq: Once | INTRAMUSCULAR | Status: AC

## 2022-12-22 MED ORDER — MIRTAZAPINE 15 MG PO TABS
7.5000 mg | ORAL_TABLET | Freq: Every day | ORAL | Status: DC
  Administered 2022-12-22 – 2022-12-25 (×4): 7.5 mg via ORAL
  Filled 2022-12-22 (×4): qty 1

## 2022-12-22 MED ORDER — DEXMEDETOMIDINE HCL IN NACL 400 MCG/100ML IV SOLN
0.0000 ug/kg/h | INTRAVENOUS | Status: DC
  Administered 2022-12-22: 0.4 ug/kg/h via INTRAVENOUS
  Filled 2022-12-22: qty 100

## 2022-12-22 MED ORDER — FLUTICASONE FUROATE-VILANTEROL 100-25 MCG/ACT IN AEPB
1.0000 | INHALATION_SPRAY | Freq: Every day | RESPIRATORY_TRACT | Status: DC
  Administered 2022-12-22 – 2022-12-26 (×5): 1 via RESPIRATORY_TRACT
  Filled 2022-12-22 (×2): qty 28

## 2022-12-22 MED ORDER — CLOPIDOGREL BISULFATE 75 MG PO TABS
75.0000 mg | ORAL_TABLET | Freq: Every day | ORAL | Status: DC
  Administered 2022-12-22 – 2022-12-26 (×5): 75 mg via ORAL
  Filled 2022-12-22 (×5): qty 1

## 2022-12-22 NOTE — Progress Notes (Signed)
Nutrition Follow Up Note   DOCUMENTATION CODES:   Non-severe (moderate) malnutrition in context of chronic illness  INTERVENTION:   Nepro Shake po TID, each supplement provides 425 kcal and 19 grams protein  Magic cup TID with meals, each supplement provides 290 kcal and 9 grams of protein  MVI po daily   NUTRITION DIAGNOSIS:   Moderate Malnutrition related to chronic illness (COPD, cirrhosis) as evidenced by mild fat depletion, moderate fat depletion, moderate muscle depletion. -ongoing   GOAL:   Patient will meet greater than or equal to 90% of their needs -previously met with tube feeds  MONITOR:   PO intake, Supplement acceptance, Labs, Weight trends, I & O's, Skin  ASSESSMENT:   67 y/o female with h/o HTN, TBI, COPD, CAD, cirrhosis, bipolar disorder, pacemaker, anxiety, PAD, hep C, PAF, sick sinus syndrome, CHF, seizures, marijuana use and tremors who is admitted with lithium toxicity, AMS and AKI.  Pt seen by SLP today and initiated on a dysphagia 1/nectar thick diet. NGT removed. Per RN, pt ate ~25% of her lunch today. RD will add supplements and MVI to help pt meet her estimated needs. Per chart, volume status improved; pt remains up ~23lbs from her UBW. Pt +8.0L on her I & Os.   Medications reviewed and include: aspirin, plavix, lovenox, insulin, protonix  Labs reviewed: K 4.1 wnl, BUN 29(H), P 4.0 wnl, Mg 2.0 wnl Hgb 9.9(L), Hct 33.4(L) Cbgs- 116, 130, 92 x 24 hrs  UOP- 2918ml  Diet Order:    Diet Order             DIET - DYS 1 Room service appropriate? Yes; Fluid consistency: Nectar Thick  Diet effective now                  EDUCATION NEEDS:   No education needs have been identified at this time  Skin:  Skin Assessment: Reviewed RN Assessment (ecchymosis)  Last BM:  3/27- 245ml via rectal tube  Height:   Ht Readings from Last 1 Encounters:  12/15/22 5\' 5"  (1.651 m)    Weight:   Wt Readings from Last 1 Encounters:  12/21/22 65.2 kg     Ideal Body Weight:  56.8 kg  BMI:  Body mass index is 23.92 kg/m.  Estimated Nutritional Needs:   Kcal:  1500-1700kcal/day  Protein:  75-85g/day  Fluid:  1.4-1.6L/day  Koleen Distance MS, RD, LDN Please refer to [facility] for RD and/or RD on-call/weekend/after hours pager

## 2022-12-22 NOTE — Progress Notes (Signed)
Mobility Specialist - Progress Note   12/22/22 1600  Mobility  Activity Ambulated with assistance to bathroom;Ambulated with assistance in room  Level of Assistance Maximum assist, patient does 25-49%  Assistive Device Front wheel walker  Distance Ambulated (ft) 15 ft  Activity Response Tolerated fair  $Mobility charge 1 Mobility     Pt lying in bed upon arrival, utilizing RA. Pt requesting assistance to [facility] however once OOB pt declined BSC and opted for ambulation to bathroom. Pt completed bed mobility with minA; fair sitting balance this date. STS minA and ambulation with maxA +2. Poor standing balance and narrow BOS. Pt requires heavy VC for sequencing gait steps and directional cueing. Assist for facilitating RW---pt pushes RW out too far in front of her and requires extra time and cueing to return to midline BOS. Pt tends to 'freeze' during ambulation with difficulty processing next step. Assist for peri-care. Pt ambulated back to bed with assist to return supine. Fatigues quickly. Alarm set and needs in reach, family at bedside. No complaints. RN notified.    Kathee Delton Mobility Specialist 12/22/22, 4:31 PM

## 2022-12-22 NOTE — Progress Notes (Signed)
PROGRESS NOTE    Cynthia [name]  [id number] DOB: [birth year] DOA: 12/15/2022 PCP: Delsa Grana, PA-C     Brief Narrative:   Cynthia [name] is a 67 y.o. female with medical history significant for bipolar disorder, sick sinus syndrome status post pacemaker, chronic hepatitis C, COPD, hypertension, TBI, seizure disorder, paroxysmal atrial fibrillation, CVA, CAD on Plavix, polysubstance abuse ( marijuana), who presents to the ED with c/o altered mental status.   Per ED reports, patient's husband reported to EMS that patient was having worsening fatigue and tremors that started around 7 PM last night.  He attempted to take her to urgent care but it was closed so he drove back home.  Due to worsening altered mental status EMS was called and on arrival patient was found to be drowsy with pinpoint pupils.  There was concerns that patient may have taken a dose of oxycodone at around 8 PM.     ED Course: Initial vital signs showed HR of 58 beats/minute, BP 146/55 mm Hg, the RR 15 breaths/minute, and the oxygen saturation 97% on  NRB and a temperature of 98.8F (36.9C). Patient was lethargic, moaned intermittently, and responded with one-word answers; symmetric movement in the arms and legs was observed.  She had received Narcan IV 0.5 mg and 1L bolus of NS.  Patient remained altered even with a second dose of IV Narcan.  On reassessment she was noted with gurgling respirations due to concerns for inability to protect her airway and pulmonary congestion she was intubated for airway protection.  PCCM consulted  3/22: Admit to ICU with acute toxic metabolic encephalopathy secondary to suspected drug overdose (Lithium overdose) requiring mechanical ventilation 3/25 remains on Vent, severe encephalopathy 3/26 remains encephalopathic, started steroids for tongue swelling  Assessment & Plan:   Principal Problem:   Lithium toxicity Active Problems:   Essential hypertension, benign   COPD (chronic  obstructive pulmonary disease) (HCC)   Artificial cardiac pacemaker   Coronary artery disease   Cirrhosis of liver (HCC)   PAD (peripheral artery disease) (HCC)   Bipolar 1 disorder, mixed, full remission ([place])   AKI (acute kidney injury) ([place])   Acute hypoxic respiratory failure (HCC)   Paroxysmal A-fib (HCC)   Epilepsy, unspecified, not intractable, without status epilepticus ([place])   Altered mental status   Toxic metabolic encephalopathy   Malnutrition of moderate degree  # Bipolar disorder # Lithium overdose Psych consult today, will f/u those recs. Was on lithium, oxcarbazepine, seroquel  # Acute toxic metabolic encephalopathy 2/2 suspected lithium overdose. Mentation is clearing - monitor  # s/p intubation Now weaned to nasal cannula -  O2, wean as able  # Aspiration pneumonia S/p 5 days of zosyn - d/c abx and monitor  # Dysphagia S/p tube feeds. SLP and RD following - dysphagia 1 diet, advance as tolerated  # Debility 2/2 intubation and ICU stay - PT/OT following, advising SNF, TOC is aware  # CAD Asymptomatic - home aspirin and atorvastatin  # Pacemaker status # history heart block Pacemaker exchange last year  # PAD Extremities warm - home aspirin, plavix, statin  # HTN Mild bp elevation today - re-start home amlodipine - home losartan on hold  # COPD - breo/incruse for home trelegy  # Lines/tubes - will ask nursing to remove rectal tube today      DVT prophylaxis: lovenox Code Status: full Family Communication: son updated @ bedside 3/28  Level of care: Stepdown Status is: Inpatient Remains inpatient appropriate because: severity of  illness    Consultants:  none  Procedures: none  Antimicrobials:  S/p zosyn    Subjective: No complaints, says feeling weak overall  Objective: Vitals:   12/22/22 0900 12/22/22 1000 12/22/22 1100 12/22/22 1200  BP: 132/60 (!) 150/72 (!) 152/68 (!) 145/63  Pulse: (!) 55 (!) 55 (!) 55 (!)  55  Resp: 18 17 14 13   Temp:    97.6 F (36.4 C)  TempSrc:    Oral  SpO2: 100% 99% 99% 99%  Weight:      Height:        Intake/Output Summary (Last 24 hours) at 12/22/2022 1353 Last data filed at 12/22/2022 1300 Gross per 24 hour  Intake 1484.34 ml  Output 1860 ml  Net -375.66 ml   Filed Weights   12/18/22 0355 12/20/22 0500 12/21/22 1200  Weight: 63.4 kg 68.7 kg 65.2 kg    Examination:  General exam: Appears calm and comfortable  Respiratory system: scattered rhonchi Cardiovascular system: S1 & S2 heard, RRR. No JVD, murmurs, rubs, gallops or clicks. No pedal edema. Gastrointestinal system: Abdomen is nondistended, soft and nontender. No organomegaly or masses felt. Normal bowel sounds heard. Central nervous system: Alert and oriented. Mild confusion. Moving all 4 Extremities: Symmetric 5 x 5 power. Skin: No rashes, lesions or ulcers Psychiatry: mild confusion   Data Reviewed: I have personally reviewed following labs and imaging studies  CBC: Recent Labs  Lab 12/15/22 2148 12/16/22 1457 12/18/22 0429 12/19/22 0328 12/20/22 0408 12/21/22 0712 12/22/22 0517  WBC 7.4   < > 9.0 5.4 4.7 4.8 7.4  NEUTROABS 4.9  --   --   --   --   --   --   HGB 13.0   < > 11.5* 10.4* 10.3* 10.4* 9.9*  HCT 41.6   < > 37.7 34.2* 34.3* 34.1* 33.4*  MCV 99.3   < > 101.9* 103.0* 103.6* 100.9* 104.4*  PLT 171   < > 131* 96* 117* 134* 160   < > = values in this interval not displayed.   Basic Metabolic Panel: Recent Labs  Lab 12/17/22 0356 12/18/22 0429 12/19/22 0328 12/20/22 0408 12/21/22 0712 12/21/22 0713 12/22/22 0517  NA 139 142 139 139  --  141 145  K 4.2 3.7 4.4 4.2  --  4.5 4.1  CL 117* 116* 116* 115*  --  111 112*  CO2 17* 18* 18* 21*  --  23 23  GLUCOSE 162* 183* 202* 177*  --  178* 156*  BUN 13 11 13 17   --  27* 29*  CREATININE 1.08* 1.01* 0.92 0.85  --  1.00 0.93  CALCIUM 8.8* 8.8* 8.7* 8.6*  --  9.6 9.3  MG 1.8  --  1.9 1.9 2.1  --  2.0  PHOS 3.2 2.9 3.4 3.6   --  3.8 4.0   GFR: Estimated Creatinine Clearance: 52.1 mL/min (by C-G formula based on SCr of 0.93 mg/dL). Liver Function Tests: Recent Labs  Lab 12/17/22 0356 12/18/22 0429 12/19/22 0328 12/20/22 0408 12/21/22 0713  ALBUMIN 3.0* 2.4* 2.5* 2.4* 3.3*   No results for input(s): "LIPASE", "AMYLASE" in the last 168 hours. Recent Labs  Lab 12/16/22 0038  AMMONIA 26   Coagulation Profile: No results for input(s): "INR", "PROTIME" in the last 168 hours. Cardiac Enzymes: No results for input(s): "CKTOTAL", "CKMB", "CKMBINDEX", "TROPONINI" in the last 168 hours. BNP (last 3 results) No results for input(s): "PROBNP" in the last 8760 hours. HbA1C: No results for input(s): "HGBA1C"  in the last 72 hours. CBG: Recent Labs  Lab 12/21/22 1924 12/21/22 2342 12/22/22 0412 12/22/22 0738 12/22/22 1117  GLUCAP 184* 171* 92 130* 116*   Lipid Profile: No results for input(s): "CHOL", "HDL", "LDLCALC", "TRIG", "CHOLHDL", "LDLDIRECT" in the last 72 hours. Thyroid Function Tests: No results for input(s): "TSH", "T4TOTAL", "FREET4", "T3FREE", "THYROIDAB" in the last 72 hours. Anemia Panel: No results for input(s): "VITAMINB12", "FOLATE", "FERRITIN", "TIBC", "IRON", "RETICCTPCT" in the last 72 hours. Urine analysis:    Component Value Date/Time   COLORURINE YELLOW (A) 12/16/2022 0310   APPEARANCEUR CLEAR (A) 12/16/2022 0310   APPEARANCEUR Cloudy (A) 02/08/2016 1546   LABSPEC 1.015 12/16/2022 0310   PHURINE 8.5 (H) 12/16/2022 0310   GLUCOSEU NEGATIVE 12/16/2022 0310   HGBUR LARGE (A) 12/16/2022 0310   BILIRUBINUR NEGATIVE 12/16/2022 0310   BILIRUBINUR negative 09/20/2022 1300   BILIRUBINUR Negative 02/08/2016 1546   KETONESUR NEGATIVE 12/16/2022 0310   PROTEINUR 100 (A) 12/16/2022 0310   UROBILINOGEN 0.2 09/20/2022 1300   NITRITE NEGATIVE 12/16/2022 0310   LEUKOCYTESUR SMALL (A) 12/16/2022 0310   Sepsis Labs: @LABRCNTIP (procalcitonin:4,lacticidven:4)  ) Recent Results (from  the past 240 hour(s))  Urine Culture     Status: None   Collection Time: 12/16/22  3:10 AM   Specimen: Urine, Random  Result Value Ref Range Status   Specimen Description   Final    URINE, RANDOM Performed at [facility], [address] [place], [place] [address]    Special Requests URINE, CLEAN CATCH  Final   Culture   Final    NO GROWTH Performed at [facility], [place] [address], [place], [place] [address]    Report Status 12/17/2022 FINAL  Final  Culture, Respiratory w Gram Stain     Status: None   Collection Time: 12/17/22  8:41 AM   Specimen: Tracheal Aspirate; Respiratory  Result Value Ref Range Status   Specimen Description   Final    TRACHEAL ASPIRATE Performed at [facility], [address], [place], [place] [address]    Special Requests   Final    NONE Performed at [facility], [address] [place], [place] [address]    Gram Stain   Final    MODERATE WBC PRESENT, PREDOMINANTLY PMN FEW GRAM NEGATIVE RODS FEW GRAM POSITIVE COCCI    Culture   Final    RARE Normal respiratory flora-no Staph aureus or Pseudomonas seen Performed at [facility] [address], [place], [place] [address]    Report Status 12/20/2022 FINAL  Final  MRSA Next Gen by PCR, Nasal     Status: None   Collection Time: 12/17/22 11:20 AM   Specimen: Nasal Mucosa; Nasal Swab  Result Value Ref Range Status   MRSA by PCR Next Gen NOT DETECTED NOT DETECTED Final    Comment: (NOTE) The GeneXpert MRSA Assay (FDA approved for NASAL specimens only), is one component of a comprehensive MRSA colonization surveillance program. It is not intended to diagnose MRSA infection nor to guide or monitor treatment for MRSA infections. Test performance is not FDA approved in patients less than 84 years old. Performed at [facility], [address] [place], [place] [address]          Radiology Studies: [facility] Chest [place] 1 View  Result Date:  12/22/2022 CLINICAL DATA:  Shortness of breath. EXAM: PORTABLE CHEST 1 VIEW COMPARISON:  12/17/2022 FINDINGS: LEFT-sided transvenous pacemaker leads overlie the RIGHT atrium and RIGHT ventricle. The  heart size is normal. There are mild patchy opacities at the lung bases, RIGHT greater than LEFT and slightly increased compared to prior study. Small bilateral pleural effusions are present. IMPRESSION: 1. Slight increase in bibasilar opacities, RIGHT greater than LEFT. 2. Small bilateral pleural effusions. Electronically Signed   By: Nolon Nations M.D.   On: 12/22/2022 07:42        Scheduled Meds:  amLODipine  10 mg Oral Daily   aspirin EC  81 mg Oral Daily   atorvastatin  20 mg Oral QHS   Chlorhexidine Gluconate Cloth  6 each Topical Daily   clopidogrel  75 mg Oral Daily   enoxaparin (LOVENOX) injection  40 mg Subcutaneous Q24H   fluticasone furoate-vilanterol  1 puff Inhalation Daily   And   umeclidinium bromide  1 puff Inhalation Daily   insulin aspart  0-15 Units Subcutaneous Q4H   mouth rinse  15 mL Mouth Rinse 4 times per day   pantoprazole (PROTONIX) IV  40 mg Intravenous Daily   Continuous Infusions:  sodium chloride 10 mL/hr at 12/22/22 0959     LOS: 6 days     Desma Maxim, MD [facility]   If 7PM-7AM, please contact night-coverage [email or web address] Password [facility] 12/22/2022, 1:53 PM

## 2022-12-22 NOTE — Progress Notes (Signed)
Physical Therapy Treatment Patient Details Name: Lashiya Penado MRN: [id number] DOB: Aug 24, 1956 Today's Date: 12/22/2022   History of Present Illness Pt admitted for AMS with history including bipolar, pacemaker, COPD, HTN, TBI, seizures, and polysubstance abuse. Pt intubated 3/22-3/27.    PT Comments    Pt resting in bed upon PT arrival; even with a lot of encouragement, pt refusing any OOB mobility (even sitting on edge of bed) but initially agreeable to LE ex's in bed.  After performing minimal LE ex's, pt then refusing anymore ex's or any activity in general so session ended.  Will attempt to progress functional mobility as able.    Recommendations for follow up therapy are one component of a multi-disciplinary discharge planning process, led by the attending physician.  Recommendations may be updated based on patient status, additional functional criteria and insurance authorization.  Follow Up Recommendations  Can patient physically be transported by private vehicle: No    Assistance Recommended at Discharge Frequent or constant Supervision/Assistance  Patient can return home with the following A lot of help with walking and/or transfers;A lot of help with bathing/dressing/bathroom;Help with stairs or ramp for entrance   Equipment Recommendations   (TBD)    Recommendations for Other Services       Precautions / Restrictions Precautions Precautions: Fall Restrictions Weight Bearing Restrictions: No     Mobility  Bed Mobility Overal bed mobility:  (pt refused any OOB mobility)                  Transfers                        Ambulation/Gait                   Stairs             Wheelchair Mobility    Modified Rankin (Stroke Patients Only)       Balance                                            Cognition Arousal/Alertness: Awake/alert Behavior During Therapy: Flat affect Overall Cognitive Status: No  family/caregiver present to determine baseline cognitive functioning                                 General Comments: Oriented to at least self; confused to place/situation        Exercises General Exercises - Lower Extremity Ankle Circles/Pumps: AROM, Strengthening, Both, 5 reps, Supine Heel Slides: AAROM, Strengthening, Both, 10 reps, Supine Hip ABduction/ADduction: AAROM, Strengthening, Right, Supine (2 reps (then pt refused to do anymore ex's))    General Comments        Pertinent Vitals/Pain Pain Assessment Pain Assessment: No/denies pain HR around 55 bpm and O2 sats WFL on room air during sessions activities.    Home Living                          Prior Function            PT Goals (current goals can now be found in the care plan section) Acute Rehab PT Goals Patient Stated Goal: to get stronger PT Goal Formulation: With patient Time For Goal Achievement: 01/04/23 Potential to Achieve Goals: Fair Additional  Goals Additional Goal #1: Pt will be able to perform bed mobility/transfers with supervision and safe technique in order to improve functional independence Progress towards PT goals: Not progressing toward goals - comment (limited per pt's participation level)    Frequency    Min 2X/week      PT Plan Current plan remains appropriate    Co-evaluation              AM-PAC PT "6 Clicks" Mobility   Outcome Measure  Help needed turning from your back to your side while in a flat bed without using bedrails?: A Little Help needed moving from lying on your back to sitting on the side of a flat bed without using bedrails?: A Little Help needed moving to and from a bed to a chair (including a wheelchair)?: A Lot Help needed standing up from a chair using your arms (e.g., wheelchair or bedside chair)?: A Lot Help needed to walk in hospital room?: A Lot Help needed climbing 3-5 steps with a railing? : Total 6 Click Score: 13     End of Session   Activity Tolerance: Patient tolerated treatment well Patient left: in bed;with call bell/phone within reach;with bed alarm set Nurse Communication: Other (comment) (pt declining to do much with therapist) PT Visit Diagnosis: Unsteadiness on feet (R26.81);Muscle weakness (generalized) (M62.81);Difficulty in walking, not elsewhere classified (R26.2)     Time: [id number] PT Time Calculation (min) (ACUTE ONLY): 12 min  Charges:  $Therapeutic Exercise: 8-22 mins                     Leitha Bleak, PT 12/22/22, 3:47 PM
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
## 2022-12-23 DIAGNOSIS — T56891A Toxic effect of other metals, accidental (unintentional), initial encounter: Secondary | ICD-10-CM | POA: Diagnosis not present

## 2022-12-23 LAB — PHOSPHORUS: Phosphorus: 3.9 mg/dL (ref 2.5–4.6)

## 2022-12-23 LAB — COMPREHENSIVE METABOLIC PANEL
ALT: 42 U/L (ref 0–44)
AST: 34 U/L (ref 15–41)
Albumin: 3.4 g/dL — ABNORMAL LOW (ref 3.5–5.0)
Alkaline Phosphatase: 65 U/L (ref 38–126)
Anion gap: 11 (ref 5–15)
BUN: 27 mg/dL — ABNORMAL HIGH (ref 8–23)
CO2: 23 mmol/L (ref 22–32)
Calcium: 9.4 mg/dL (ref 8.9–10.3)
Chloride: 108 mmol/L (ref 98–111)
Creatinine, Ser: 1.01 mg/dL — ABNORMAL HIGH (ref 0.44–1.00)
GFR, Estimated: >60 mL/min (ref >60)
Glucose, Bld: 135 mg/dL — ABNORMAL HIGH (ref 70–99)
Potassium: 3.8 mmol/L (ref 3.5–5.1)
Sodium: 142 mmol/L (ref 135–145)
Total Bilirubin: 0.7 mg/dL (ref 0.3–1.2)
Total Protein: 7.2 g/dL (ref 6.5–8.1)

## 2022-12-23 LAB — GLUCOSE, CAPILLARY
Glucose-Capillary: 118 mg/dL — ABNORMAL HIGH (ref 70–99)
Glucose-Capillary: 127 mg/dL — ABNORMAL HIGH (ref 70–99)
Glucose-Capillary: 141 mg/dL — ABNORMAL HIGH (ref 70–99)
Glucose-Capillary: 144 mg/dL — ABNORMAL HIGH (ref 70–99)
Glucose-Capillary: 171 mg/dL — ABNORMAL HIGH (ref 70–99)
Glucose-Capillary: 90 mg/dL (ref 70–99)

## 2022-12-23 LAB — MAGNESIUM: Magnesium: 2.1 mg/dL (ref 1.7–2.4)

## 2022-12-23 MED ORDER — LOSARTAN POTASSIUM 50 MG PO TABS
100.0000 mg | ORAL_TABLET | Freq: Every day | ORAL | Status: DC
  Administered 2022-12-23 – 2022-12-26 (×4): 100 mg via ORAL
  Filled 2022-12-23 (×4): qty 2

## 2022-12-23 MED ORDER — DOCUSATE SODIUM 100 MG PO CAPS: 100.0000 mg | ORAL_CAPSULE | Freq: Two times a day (BID) | ORAL | Status: DC | PRN

## 2022-12-23 MED ORDER — METHOCARBAMOL 500 MG PO TABS
500.0000 mg | ORAL_TABLET | Freq: Once | ORAL | Status: AC
  Administered 2022-12-23: 500 mg via ORAL
  Filled 2022-12-23: qty 1

## 2022-12-23 MED ORDER — POLYETHYLENE GLYCOL 3350 17 G PO PACK
34.0000 g | PACK | Freq: Every day | ORAL | Status: DC
  Administered 2022-12-24 – 2022-12-26 (×3): 34 g via ORAL
  Filled 2022-12-23 (×3): qty 2

## 2022-12-23 MED ORDER — POLYETHYLENE GLYCOL 3350 17 G PO PACK: 17.0000 g | PACK | Freq: Every day | ORAL | Status: DC | PRN

## 2022-12-23 MED ORDER — ACETAMINOPHEN 325 MG PO TABS
650.0000 mg | ORAL_TABLET | Freq: Four times a day (QID) | ORAL | Status: DC | PRN
  Administered 2022-12-23 – 2022-12-26 (×5): 650 mg via ORAL
  Filled 2022-12-23 (×5): qty 2

## 2022-12-23 MED ORDER — PANTOPRAZOLE SODIUM 40 MG PO TBEC
40.0000 mg | DELAYED_RELEASE_TABLET | Freq: Every day | ORAL | Status: DC
  Administered 2022-12-23 – 2022-12-26 (×4): 40 mg via ORAL
  Filled 2022-12-23 (×4): qty 1

## 2022-12-23 MED ORDER — ACETAMINOPHEN 500 MG PO TABS
1000.0000 mg | ORAL_TABLET | Freq: Once | ORAL | Status: AC
  Administered 2022-12-23: 1000 mg via ORAL
  Filled 2022-12-23: qty 2

## 2022-12-23 MED ORDER — SENNA 8.6 MG PO TABS
1.0000 | ORAL_TABLET | Freq: Every day | ORAL | Status: DC
  Administered 2022-12-25 – 2022-12-26 (×2): 8.6 mg via ORAL
  Filled 2022-12-23 (×2): qty 1

## 2022-12-23 NOTE — Care Management Important Message (Signed)
Important Message  Patient Details  Name: Cynthia [name] MRN: [id number] Date of Birth: 1956/01/15   Medicare Important Message Given:  Yes     [name] 12/23/2022, 12:58 PM

## 2022-12-23 NOTE — Progress Notes (Signed)
Speech Language Pathology Treatment: Dysphagia  Patient Details Name: Cynthia [name] MRN: [id number] DOB: 07-12-1956 Today's Date: 12/23/2022 Time: [id number] SLP Time Calculation (min) (ACUTE ONLY): 22 min  Assessment / Plan / Recommendation Clinical Impression  Pt seen for ongoing dysphagia management and for advanced PO trials. Pt is known to this Probation officer from previous hospitalization (2021). Pt presents as cooperative, confused and easily distracted. Skilled observation was provided of pt consuming graham crackers with thin liquids via straw. Pt able to feed herself with support and moderate to minimal cues for attention to task. Overall, she presents with increased ability over previous session. When consuming softened graham crackers her oral phase appeared more efficient and timely than with solid dry graham crackers. Given her tendency to talk with food in her mouth, would recommend dysphagia 2 diet for more timely oral phase in light of edentulous status to reduce risk of potential aspiration. When consuming thin liquids via straw, she was free of any overt s/s of aspiration. Her pharyngeal swallow appeared swift, her vocal quality remained clear and all vitals continued to be within normal limits. At this time, recommend dysphagia 3 diet with thin liquids via cup or straw, medicine whole iin puree (d/t cognitive deficits). Further skilled St intervention is not indicated as pt unsafe to progress beyond current recommendation d/t cognitive deficits.    HPI HPI: 67yo female admitted 12/15/22 with AMS, worsening fatigue and tremors. Pt intubated in ED. Extubated 12/21/22. PMH: Polysubstance abuse, HTN, PAD, PAFib, bipolar disorder, memory impairment, chronic pain, TBI.      SLP Plan  All goals met      Recommendations for follow up therapy are one component of a multi-disciplinary discharge planning process, led by the attending physician.  Recommendations may be updated based on patient status,  additional functional criteria and insurance authorization.    Recommendations  Diet recommendations: Dysphagia 2 (fine chop);Thin liquid Liquids provided via: Cup;Straw Medication Administration: Whole meds with puree Supervision: Staff to assist with self feeding;Full supervision/cueing for compensatory strategies;Trained caregiver to feed patient Compensations: Minimize environmental distractions;Slow rate;Small sips/bites Postural Changes and/or Swallow Maneuvers: Seated upright 90 degrees;Upright 30-60 min after meal                  Oral care BID     Dysphagia, unspecified (R13.10)     All goals met  Dorsie Burich B. Rutherford Nail, M.S., CCC-SLP, Mining engineer Certified Brain Injury [place]  [place] Office [phone] Ascom [phone] Fax [phone]

## 2022-12-23 NOTE — Progress Notes (Signed)
Physical Therapy Treatment Patient Details Name: Cynthia [name] MRN: [id number] DOB: [birth year] Today's Date: 12/23/2022   History of Present Illness Pt admitted for AMS with history including bipolar, pacemaker, COPD, HTN, TBI, seizures, and polysubstance abuse. Pt intubated 3/22-3/27.    PT Comments    Pt resting in bed upon PT arrival; pt's husband and son present during session; pt agreeable to therapy with some encouragement.  During session pt mod assist with bed mobility; max assist progressing to mod assist progressing to min to mod assist (2nd assist for safety) standing with RW use; and min assist x2 to ambulate 10 feet and then 5 feet with RW use (limited distance d/t pt fatigue; chair follow).  Pt requiring encouragement to keep walking/to take steps and not to sit too early.  Will continue to focus on strengthening, balance, and progressive functional mobility during hospitalization.    Recommendations for follow up therapy are one component of a multi-disciplinary discharge planning process, led by the attending physician.  Recommendations may be updated based on patient status, additional functional criteria and insurance authorization.  Follow Up Recommendations  Can patient physically be transported by private vehicle: No    Assistance Recommended at Discharge Frequent or constant Supervision/Assistance  Patient can return home with the following A lot of help with bathing/dressing/bathroom;Help with stairs or ramp for entrance;Two people to help with walking and/or transfers;Assist for transportation;Direct supervision/assist for financial management;Direct supervision/assist for medications management;Assistance with cooking/housework   Equipment Recommendations  Rolling walker (2 wheels);BSC/3in1;Wheelchair (measurements PT);Wheelchair cushion (measurements PT)    Recommendations for Other Services       Precautions / Restrictions Precautions Precautions:  Fall Restrictions Weight Bearing Restrictions: No     Mobility  Bed Mobility Overal bed mobility: Needs Assistance Bed Mobility: Supine to Sit, Sit to Supine     Supine to sit: Mod assist Sit to supine: Mod assist   General bed mobility comments: assist to initiate movement; assist for trunk and B LE's; vc's for technique    Transfers Overall transfer level: Needs assistance Equipment used: Rolling walker (2 wheels) Transfers: Sit to/from Stand Sit to Stand: Min assist, Mod assist, Max assist, +2 safety/equipment           General transfer comment: max assist 1st 2 trials standing from bed but mod assist 3rd trial; min to mod assist to stand from recliner x1 trial; vc's for UE placement; assist to initiate stand and control descent sitting    Ambulation/Gait Ambulation/Gait assistance: Min assist, +2 physical assistance Gait Distance (Feet):  (10 feet; 5 feet) Assistive device: Rolling walker (2 wheels)   Gait velocity: decreased     General Gait Details: R knee mildly flexed; increased effort to take steps; cueing to take steps; decreased B LE step length   Stairs             Wheelchair Mobility    Modified Rankin (Stroke Patients Only)       Balance Overall balance assessment: Needs assistance Sitting-balance support: Bilateral upper extremity supported, Feet supported Sitting balance-Leahy Scale: Fair Sitting balance - Comments: steady static sitting but pt intermittently leaning forward requiring assist for safety   Standing balance support: Bilateral upper extremity supported, During functional activity, Reliant on assistive device for balance Standing balance-Leahy Scale: Poor Standing balance comment: assist for balance in standing                            Cognition Arousal/Alertness:  Awake/alert Behavior During Therapy: Flat affect, Impulsive Overall Cognitive Status: Impaired/Different from baseline                                  General Comments: Oriented to at least self; confused to place/situation        Exercises      General Comments  Nursing cleared pt for participation in physical therapy.      Pertinent Vitals/Pain Pain Assessment Pain Assessment: Faces Faces Pain Scale: Hurts little more Pain Location: abdomen Pain Descriptors / Indicators: Discomfort Pain Intervention(s): Limited activity within patient's tolerance, Monitored during session, Repositioned (Pt's husband reports recent medications for abdominal discomfort) Vitals (HR and O2 on room air) stable and WFL throughout treatment session.    Home Living                          Prior Function            PT Goals (current goals can now be found in the care plan section) Acute Rehab PT Goals Patient Stated Goal: to get stronger PT Goal Formulation: With patient Time For Goal Achievement: 01/04/23 Potential to Achieve Goals: Fair Additional Goals Additional Goal #1: Pt will be able to perform bed mobility/transfers with supervision and safe technique in order to improve functional independence Progress towards PT goals: Progressing toward goals    Frequency    Min 2X/week      PT Plan Current plan remains appropriate    Co-evaluation              AM-PAC PT "6 Clicks" Mobility   Outcome Measure  Help needed turning from your back to your side while in a flat bed without using bedrails?: A Little Help needed moving from lying on your back to sitting on the side of a flat bed without using bedrails?: A Lot Help needed moving to and from a bed to a chair (including a wheelchair)?: A Lot Help needed standing up from a chair using your arms (e.g., wheelchair or bedside chair)?: A Lot Help needed to walk in hospital room?: Total Help needed climbing 3-5 steps with a railing? : Total 6 Click Score: 11    End of Session Equipment Utilized During Treatment: Gait belt Activity Tolerance:  Patient limited by fatigue Patient left: in bed;with call bell/phone within reach;with bed alarm set;with family/visitor present Nurse Communication: Mobility status;Precautions PT Visit Diagnosis: Unsteadiness on feet (R26.81);Muscle weakness (generalized) (M62.81);Difficulty in walking, not elsewhere classified (R26.2)     Time: [id number] PT Time Calculation (min) (ACUTE ONLY): 27 min  Charges:  $Gait Training: 8-22 mins $Therapeutic Activity: 8-22 mins                     Leitha Bleak, PT 12/23/22, 10:55 AM

## 2022-12-23 NOTE — Progress Notes (Signed)
PHARMACIST - PHYSICIAN COMMUNICATION  DR: Si Raider  CONCERNING: IV to Oral Route Change Policy  RECOMMENDATION: This patient is receiving pantoprazole by the intravenous route.  Based on criteria approved by the Pharmacy and Therapeutics Committee, the intravenous medication is being converted to the equivalent oral dose form.   DESCRIPTION: These criteria include: The patient is eating (either orally or via tube) and/or has been taking other orally administered medications for a least 24 hours The patient has no evidence of active gastrointestinal bleeding or impaired GI absorption (gastrectomy, short bowel, patient on TNA or NPO).  If you have questions about this conversion, please contact the Pharmacy Department  []   [phone] )  [facility] [x]   [phone] )  [facility] []   [phone] )  [facility] []   [phone] )  [facility] []   [phone] )  [place], PharmD, BCPS Clinical Pharmacist  12/23/2022 7:10 AM

## 2022-12-23 NOTE — Progress Notes (Signed)
PROGRESS NOTE    Cynthia [name]  [id number] DOB: 1956/02/07 DOA: 12/15/2022 PCP: Delsa Grana, PA-C     Brief Narrative:   Cynthia [name] is a 67 y.o. female with medical history significant for bipolar disorder, sick sinus syndrome status post pacemaker, chronic hepatitis C, COPD, hypertension, TBI, seizure disorder, paroxysmal atrial fibrillation, CVA, CAD on Plavix, polysubstance abuse ( marijuana), who presents to the ED with c/o altered mental status.   Per ED reports, patient's husband reported to EMS that patient was having worsening fatigue and tremors that started around 7 PM last night.  He attempted to take her to urgent care but it was closed so he drove back home.  Due to worsening altered mental status EMS was called and on arrival patient was found to be drowsy with pinpoint pupils.  There was concerns that patient may have taken a dose of oxycodone at around 8 PM.     ED Course: Initial vital signs showed HR of 58 beats/minute, BP 146/55 mm Hg, the RR 15 breaths/minute, and the oxygen saturation 97% on  NRB and a temperature of 98.55F (36.9C). Patient was lethargic, moaned intermittently, and responded with one-word answers; symmetric movement in the arms and legs was observed.  She had received Narcan IV 0.5 mg and 1L bolus of NS.  Patient remained altered even with a second dose of IV Narcan.  On reassessment she was noted with gurgling respirations due to concerns for inability to protect her airway and pulmonary congestion she was intubated for airway protection.  PCCM consulted  ICU course as follows 3/22: Admit to ICU with acute toxic metabolic encephalopathy secondary to suspected drug overdose (Lithium overdose) requiring mechanical ventilation 3/25 remains on Vent, severe encephalopathy 3/26 remains encephalopathic, started steroids for tongue swelling 3/27 extubated, transfer to hospitalist service tomorrow   Assessment & Plan:   Principal Problem:   Lithium  toxicity Active Problems:   Essential hypertension, benign   COPD (chronic obstructive pulmonary disease) ([place])   Artificial cardiac pacemaker   Coronary artery disease   Cirrhosis of liver (HCC)   PAD (peripheral artery disease) (HCC)   Bipolar 1 disorder, mixed, full remission ([place])   AKI (acute kidney injury) ([place])   Acute hypoxic respiratory failure (HCC)   Paroxysmal A-fib (HCC)   Epilepsy, unspecified, not intractable, without status epilepticus ([place])   Altered mental status   Toxic metabolic encephalopathy   Malnutrition of moderate degree  # Bipolar disorder # Lithium overdose Psych consulted today, advises starting mirtazapine which is now ordered. Was on lithium, oxcarbazepine, seroquel at home  # Acute toxic metabolic encephalopathy 2/2 suspected lithium overdose. Now appears to be mostly resolved. - monitor  # s/p intubation Now weaned to room air - monitor  # Aspiration pneumonia Completed 5 days of zosyn, weaned to room air - monitor  # Dysphagia S/p tube feeds. SLP and RD following.  - dysphagia 1 diet, advance as tolerated  # Debility 2/2 intubation and ICU stay - PT/OT following, advising SNF, TOC is aware  # CAD Asymptomatic - home aspirin and atorvastatin  # Pacemaker status # history heart block Pacemaker exchange last year  # PAD Extremities warm - home aspirin, plavix, statin  # HTN moderate bp elevation today - re-start home amlodipine - home losartan re-started today  # COPD - breo/incruse for home trelegy    DVT prophylaxis: lovenox Code Status: full Family Communication: son updated @ bedside 3/29  Level of care: Med-Surg Status is: Inpatient Remains inpatient  appropriate because: severity of illness    Consultants:  none  Procedures: none  Antimicrobials:  S/p zosyn    Subjective: No complaints, ate some breakfast  Objective: Vitals:   12/22/22 2023 12/23/22 0447 12/23/22 0500 12/23/22 0825  BP: (!)  153/61 (!) 151/64  (!) 166/65  Pulse: (!) 56 (!) 56  (!) 55  Resp: 20 18  16   Temp: 97.6 F (36.4 C) 98.3 F (36.8 C)  98.1 F (36.7 C)  TempSrc:  Oral  Oral  SpO2: 92% 94%  95%  Weight:   65.2 kg   Height:        Intake/Output Summary (Last 24 hours) at 12/23/2022 1305 Last data filed at 12/23/2022 1044 Gross per 24 hour  Intake 360 ml  Output 1100 ml  Net -740 ml   Filed Weights   12/20/22 0500 12/21/22 1200 12/23/22 0500  Weight: 68.7 kg 65.2 kg 65.2 kg    Examination:  General exam: Appears calm and comfortable  Respiratory system: scattered rhonchi Cardiovascular system: S1 & S2 heard, RRR. No JVD, murmurs, rubs, gallops or clicks. No pedal edema. Gastrointestinal system: Abdomen is nondistended, soft and nontender. No organomegaly or masses felt. Normal bowel sounds heard. Central nervous system: Alert and oriented. Mild confusion. Moving all 4 Extremities: Symmetric 5 x 5 power. Skin: No rashes, lesions or ulcers Psychiatry: mild confusion   Data Reviewed: I have personally reviewed following labs and imaging studies  CBC: Recent Labs  Lab 12/18/22 0429 12/19/22 0328 12/20/22 0408 12/21/22 0712 12/22/22 0517  WBC 9.0 5.4 4.7 4.8 7.4  HGB 11.5* 10.4* 10.3* 10.4* 9.9*  HCT 37.7 34.2* 34.3* 34.1* 33.4*  MCV 101.9* 103.0* 103.6* 100.9* 104.4*  PLT 131* 96* 117* 134* 0000000   Basic Metabolic Panel: Recent Labs  Lab 12/19/22 0328 12/20/22 0408 12/21/22 0712 12/21/22 0713 12/22/22 0517 12/23/22 0922  NA 139 139  --  141 145 142  K 4.4 4.2  --  4.5 4.1 3.8  CL 116* 115*  --  111 112* 108  CO2 18* 21*  --  23 23 23   GLUCOSE 202* 177*  --  178* 156* 135*  BUN 13 17  --  27* 29* 27*  CREATININE 0.92 0.85  --  1.00 0.93 1.01*  CALCIUM 8.7* 8.6*  --  9.6 9.3 9.4  MG 1.9 1.9 2.1  --  2.0 2.1  PHOS 3.4 3.6  --  3.8 4.0 3.9   GFR: Estimated Creatinine Clearance: 48 mL/min (A) (by C-G formula based on SCr of 1.01 mg/dL (H)). Liver Function Tests: Recent  Labs  Lab 12/18/22 0429 12/19/22 0328 12/20/22 0408 12/21/22 0713 12/23/22 0922  AST  --   --   --   --  34  ALT  --   --   --   --  42  ALKPHOS  --   --   --   --  65  BILITOT  --   --   --   --  0.7  PROT  --   --   --   --  7.2  ALBUMIN 2.4* 2.5* 2.4* 3.3* 3.4*   No results for input(s): "LIPASE", "AMYLASE" in the last 168 hours. No results for input(s): "AMMONIA" in the last 168 hours.  Coagulation Profile: No results for input(s): "INR", "PROTIME" in the last 168 hours. Cardiac Enzymes: No results for input(s): "CKTOTAL", "CKMB", "CKMBINDEX", "TROPONINI" in the last 168 hours. BNP (last 3 results) No results for input(s): "PROBNP"  in the last 8760 hours. HbA1C: No results for input(s): "HGBA1C" in the last 72 hours. CBG: Recent Labs  Lab 12/22/22 2019 12/23/22 0003 12/23/22 0407 12/23/22 0828 12/23/22 1137  GLUCAP 125* 90 127* 118* 144*   Lipid Profile: No results for input(s): "CHOL", "HDL", "LDLCALC", "TRIG", "CHOLHDL", "LDLDIRECT" in the last 72 hours. Thyroid Function Tests: No results for input(s): "TSH", "T4TOTAL", "FREET4", "T3FREE", "THYROIDAB" in the last 72 hours. Anemia Panel: No results for input(s): "VITAMINB12", "FOLATE", "FERRITIN", "TIBC", "IRON", "RETICCTPCT" in the last 72 hours. Urine analysis:    Component Value Date/Time   COLORURINE YELLOW (A) 12/16/2022 0310   APPEARANCEUR CLEAR (A) 12/16/2022 0310   APPEARANCEUR Cloudy (A) 02/08/2016 1546   LABSPEC 1.015 12/16/2022 0310   PHURINE 8.5 (H) 12/16/2022 0310   GLUCOSEU NEGATIVE 12/16/2022 0310   HGBUR LARGE (A) 12/16/2022 0310   BILIRUBINUR NEGATIVE 12/16/2022 0310   BILIRUBINUR negative 09/20/2022 1300   BILIRUBINUR Negative 02/08/2016 1546   KETONESUR NEGATIVE 12/16/2022 0310   PROTEINUR 100 (A) 12/16/2022 0310   UROBILINOGEN 0.2 09/20/2022 1300   NITRITE NEGATIVE 12/16/2022 0310   LEUKOCYTESUR SMALL (A) 12/16/2022 0310   Sepsis  Labs: @LABRCNTIP (procalcitonin:4,lacticidven:4)  ) Recent Results (from the past 240 hour(s))  Urine Culture     Status: None   Collection Time: 12/16/22  3:10 AM   Specimen: Urine, Random  Result Value Ref Range Status   Specimen Description   Final    URINE, RANDOM Performed at [facility], [address], [place], [place] [address]    Special Requests URINE, CLEAN CATCH  Final   Culture   Final    NO GROWTH Performed at [facility], [place] [address], [place], [place] [address]    Report Status 12/17/2022 FINAL  Final  Culture, Respiratory w Gram Stain     Status: None   Collection Time: 12/17/22  8:41 AM   Specimen: Tracheal Aspirate; Respiratory  Result Value Ref Range Status   Specimen Description   Final    TRACHEAL ASPIRATE Performed at [facility], [address], [place], [place] [address]    Special Requests   Final    NONE Performed at [facility], [address] [place], [place] [address]    Gram Stain   Final    MODERATE WBC PRESENT, PREDOMINANTLY PMN FEW GRAM NEGATIVE RODS FEW GRAM POSITIVE COCCI    Culture   Final    RARE Normal respiratory flora-no Staph aureus or Pseudomonas seen Performed at [facility] [address], [place], [place] [address]    Report Status 12/20/2022 FINAL  Final  MRSA Next Gen by PCR, Nasal     Status: None   Collection Time: 12/17/22 11:20 AM   Specimen: Nasal Mucosa; Nasal Swab  Result Value Ref Range Status   MRSA by PCR Next Gen NOT DETECTED NOT DETECTED Final    Comment: (NOTE) The GeneXpert MRSA Assay (FDA approved for NASAL specimens only), is one component of a comprehensive MRSA colonization surveillance program. It is not intended to diagnose MRSA infection nor to guide or monitor treatment for MRSA infections. Test performance is not FDA approved in patients less than 60 years old. Performed at [facility], [address], [place],  [address]           Radiology Studies: [facility] Chest [place] 1 View  Result Date: 12/22/2022 CLINICAL DATA:  Shortness of breath. EXAM: PORTABLE CHEST 1 VIEW COMPARISON:  12/17/2022 FINDINGS: LEFT-sided  transvenous pacemaker leads overlie the RIGHT atrium and RIGHT ventricle. The heart size is normal. There are mild patchy opacities at the lung bases, RIGHT greater than LEFT and slightly increased compared to prior study. Small bilateral pleural effusions are present. IMPRESSION: 1. Slight increase in bibasilar opacities, RIGHT greater than LEFT. 2. Small bilateral pleural effusions. Electronically Signed   By: Nolon Nations M.D.   On: 12/22/2022 07:42        Scheduled Meds:  amLODipine  10 mg Oral Daily   aspirin EC  81 mg Oral Daily   atorvastatin  20 mg Oral QHS   Chlorhexidine Gluconate Cloth  6 each Topical Daily   clopidogrel  75 mg Oral Daily   enoxaparin (LOVENOX) injection  40 mg Subcutaneous Q24H   feeding supplement (NEPRO CARB STEADY)  237 mL Oral TID BM   fluticasone furoate-vilanterol  1 puff Inhalation Daily   And   umeclidinium bromide  1 puff Inhalation Daily   losartan  100 mg Oral Daily   mirtazapine  7.5 mg Oral QHS   multivitamin with minerals  1 tablet Oral Daily   mouth rinse  15 mL Mouth Rinse 4 times per day   pantoprazole  40 mg Oral Daily   Continuous Infusions:  sodium chloride 10 mL/hr at 12/22/22 0959     LOS: 7 days     Desma Maxim, MD [facility]   If 7PM-7AM, please contact night-coverage [email or web address] Password [email or web address] 12/23/2022, 1:05 PM

## 2022-12-24 DIAGNOSIS — R410 Disorientation, unspecified: Secondary | ICD-10-CM

## 2022-12-24 DIAGNOSIS — T56891A Toxic effect of other metals, accidental (unintentional), initial encounter: Secondary | ICD-10-CM | POA: Diagnosis not present

## 2022-12-24 LAB — PHOSPHORUS: Phosphorus: 3.5 mg/dL (ref 2.5–4.6)

## 2022-12-24 LAB — GLUCOSE, CAPILLARY
Glucose-Capillary: 100 mg/dL — ABNORMAL HIGH (ref 70–99)
Glucose-Capillary: 108 mg/dL — ABNORMAL HIGH (ref 70–99)
Glucose-Capillary: 119 mg/dL — ABNORMAL HIGH (ref 70–99)
Glucose-Capillary: 138 mg/dL — ABNORMAL HIGH (ref 70–99)
Glucose-Capillary: 156 mg/dL — ABNORMAL HIGH (ref 70–99)

## 2022-12-24 LAB — COMPREHENSIVE METABOLIC PANEL
ALT: 32 U/L (ref 0–44)
AST: 23 U/L (ref 15–41)
Albumin: 3.5 g/dL (ref 3.5–5.0)
Alkaline Phosphatase: 62 U/L (ref 38–126)
Anion gap: 16 — ABNORMAL HIGH (ref 5–15)
BUN: 25 mg/dL — ABNORMAL HIGH (ref 8–23)
CO2: 22 mmol/L (ref 22–32)
Calcium: 9.5 mg/dL (ref 8.9–10.3)
Chloride: 105 mmol/L (ref 98–111)
Creatinine, Ser: 0.91 mg/dL (ref 0.44–1.00)
GFR, Estimated: >60 mL/min (ref >60)
Glucose, Bld: 124 mg/dL — ABNORMAL HIGH (ref 70–99)
Potassium: 4.3 mmol/L (ref 3.5–5.1)
Sodium: 143 mmol/L (ref 135–145)
Total Bilirubin: 0.7 mg/dL (ref 0.3–1.2)
Total Protein: 7 g/dL (ref 6.5–8.1)

## 2022-12-24 LAB — MAGNESIUM: Magnesium: 2.1 mg/dL (ref 1.7–2.4)

## 2022-12-24 MED ORDER — NICOTINE POLACRILEX 2 MG MT GUM: 2.0000 mg | CHEWING_GUM | OROMUCOSAL | Status: DC | PRN

## 2022-12-24 MED ORDER — NICOTINE 14 MG/24HR TD PT24
14.0000 mg | MEDICATED_PATCH | Freq: Every day | TRANSDERMAL | Status: DC
  Administered 2022-12-24 – 2022-12-26 (×3): 14 mg via TRANSDERMAL
  Filled 2022-12-24 (×3): qty 1

## 2022-12-24 MED ORDER — OLANZAPINE 10 MG IM SOLR
5.0000 mg | Freq: Two times a day (BID) | INTRAMUSCULAR | Status: DC | PRN
  Filled 2022-12-24: qty 10

## 2022-12-24 MED ORDER — OLANZAPINE 5 MG PO TBDP
5.0000 mg | ORAL_TABLET | Freq: Two times a day (BID) | ORAL | Status: DC | PRN
  Administered 2022-12-24 – 2022-12-26 (×5): 5 mg via ORAL
  Filled 2022-12-24 (×8): qty 1

## 2022-12-24 NOTE — TOC Progression Note (Signed)
Transition of Care [facility]) - Progression Note    Patient Details  Name: Cynthia [name] MRN: [id number] Date of Birth: 1955-12-28  Transition of Care [facility] CM/SW [place], [place] Phone Number: 12/24/2022, 11:14 AM  Clinical Narrative:     CSW spoke with pt's spouse concerning PT recommendations for SNF, spouse states pt will be returning home. Spouse states pt is already set up with [facility] through [facility] He states pt was active with them prior to admission and he has been keeping them updated on pt status. TOC will continue to follow.   Expected Discharge Plan:  (TBD) Barriers to Discharge: Continued Medical Work up  Expected Discharge Plan and Services       Living arrangements for the past 2 months: Single Family Home                                       Social Determinants of Health (SDOH) Interventions SDOH Screenings   Food Insecurity: No Food Insecurity (06/21/2022)  Housing: Low Risk  (06/21/2022)  Transportation Needs: No Transportation Needs (06/21/2022)  Utilities: Not At Risk (06/21/2022)  Alcohol Screen: Low Risk  (09/20/2022)  Depression (PHQ2-9): High Risk (12/06/2022)  Financial Resource Strain: Low Risk  (06/21/2022)  Physical Activity: Insufficiently Active (06/21/2022)  Social Connections: Moderately Isolated (06/21/2022)  Stress: No Stress Concern Present (06/21/2022)  Tobacco Use: High Risk (12/06/2022)    Readmission Risk Interventions     No data to display

## 2022-12-24 NOTE — Progress Notes (Signed)
Mobility Specialist - Progress Note    12/24/22 1300  Mobility  Activity Ambulated with assistance to bathroom;Stood at bedside;Dangled on edge of bed  Level of Assistance Contact guard assist, steadying assist  Assistive Device Front wheel walker  Distance Ambulated (ft) 10 ft  Range of Motion/Exercises Active  Activity Response Tolerated well  Mobility Referral Yes  $Mobility charge 1 Mobility   Pt resting in bed on RA upon entry. Pt STS and ambulates to bathroom CGA with AD. Pt given manual and VC for redirection/alignment during ambulation. Pt easily distracted. Pt returned to bed and left with needs in reach and bed alarm activated.   Loma Sender Mobility Specialist 12/24/22, 1:41 PM

## 2022-12-24 NOTE — Plan of Care (Signed)
  Problem: Clinical Measurements: Goal: Respiratory complications will improve Outcome: Progressing Goal: Cardiovascular complication will be avoided Outcome: Progressing   Problem: Activity: Goal: Risk for activity intolerance will decrease Outcome: Progressing   Problem: Nutrition: Goal: Adequate nutrition will be maintained Outcome: Progressing   Problem: Safety: Goal: Ability to remain free from injury will improve Outcome: Progressing   

## 2022-12-24 NOTE — Progress Notes (Signed)
PROGRESS NOTE    Cynthia [name]  [id number]  DOB: 12-14-1955  DOA: 12/15/2022 PCP: Cynthia Grana, PA-C Outpatient Specialists:   Hospital course:  67 year old female with bipolar 1 disorder, TBI, seizure disorder, cirrhosis, H/oh CVA, PAF and HTN with ongoing MJ use was admitted with altered mental status thought to be secondary to lithium overdose.  She has had a prolonged and complicated course requiring admission to the ICU with mechanical ventilation for prolonged encephalopathy, treatment for aspiration pneumonia, initiation of steroids for tongue swelling.  Patient was extubated and transferred to hospitalist service 12/22/2022.  Subjective:  Patient was seen with 2 nurses at bedside.  Nurses were trying to give patient Zyprexa per treatment protocol.  Patient however refused the Zyprexa.  On conversation with me, patient was initially cooperative.  However she would frequently say "you do that" when I asked her if she wanted to eat eat or take her meds.  Patient does not feel that she is confused however does not know how she got here or why and how long she has been here.   Objective: Vitals:   12/23/22 1545 12/23/22 1916 12/24/22 0442 12/24/22 0700  BP: (!) 141/64 (!) 165/54 (!) 160/59 (!) 188/76  Pulse: (!) 53 81 64 69  Resp: 18 16 20 20   Temp: 98.8 F (37.1 C) 97.7 F (36.5 C) 99.1 F (37.3 C)   TempSrc:  Oral Oral   SpO2: 100% 100% 98% 94%  Weight:   46 kg   Height:        Intake/Output Summary (Last 24 hours) at 12/24/2022 1358 Last data filed at 12/24/2022 1043 Gross per 24 hour  Intake 700 ml  Output 2050 ml  Net -1350 ml   Filed Weights   12/21/22 1200 12/23/22 0500 12/24/22 0442  Weight: 65.2 kg 65.2 kg 46 kg     Exam:  General: Extremely thin female sitting on side of bed with dinner plate in front of her.  Very unsteady of gait when she wanted to walk to the bathroom, RN was essentially holding her and preventing her from falling. Eyes: sclera  anicteric, conjuctiva mild injection bilaterally CVS: S1-S2, regular  Respiratory:  decreased air entry bilaterally secondary to decreased inspiratory effort, rales at bases  GI: NABS, soft, NT  LE: Warm and well-perfused   Data Reviewed:  Basic Metabolic Panel: Recent Labs  Lab 12/20/22 0408 12/21/22 0712 12/21/22 0713 12/22/22 0517 12/23/22 0922 12/24/22 0305  NA 139  --  141 145 142 143  K 4.2  --  4.5 4.1 3.8 4.3  CL 115*  --  111 112* 108 105  CO2 21*  --  23 23 23 22   GLUCOSE 177*  --  178* 156* 135* 124*  BUN 17  --  27* 29* 27* 25*  CREATININE 0.85  --  1.00 0.93 1.01* 0.91  CALCIUM 8.6*  --  9.6 9.3 9.4 9.5  MG 1.9 2.1  --  2.0 2.1 2.1  PHOS 3.6  --  3.8 4.0 3.9 3.5    CBC: Recent Labs  Lab 12/18/22 0429 12/19/22 0328 12/20/22 0408 12/21/22 0712 12/22/22 0517  WBC 9.0 5.4 4.7 4.8 7.4  HGB 11.5* 10.4* 10.3* 10.4* 9.9*  HCT 37.7 34.2* 34.3* 34.1* 33.4*  MCV 101.9* 103.0* 103.6* 100.9* 104.4*  PLT 131* 96* 117* 134* 160     Scheduled Meds:  amLODipine  10 mg Oral Daily   aspirin EC  81 mg Oral Daily   atorvastatin  20  mg Oral QHS   clopidogrel  75 mg Oral Daily   enoxaparin (LOVENOX) injection  40 mg Subcutaneous Q24H   feeding supplement (NEPRO CARB STEADY)  237 mL Oral TID BM   fluticasone furoate-vilanterol  1 puff Inhalation Daily   And   umeclidinium bromide  1 puff Inhalation Daily   losartan  100 mg Oral Daily   mirtazapine  7.5 mg Oral QHS   multivitamin with minerals  1 tablet Oral Daily   nicotine  14 mg Transdermal Daily   pantoprazole  40 mg Oral Daily   polyethylene glycol  34 g Oral Daily   senna  1 tablet Oral Daily   Continuous Infusions:  sodium chloride 10 mL/hr at 12/22/22 0959     Assessment & Plan:   Toxic/metabolic encephalopathy Bipolar 1 disorder Encephalopathy seems resolved in that she does not have a waxing and waning mental status However she does clearly have some memory deficits, she seems somewhat paranoid  she is clearly not making coherent decisions. Very much appreciate psychiatric ongoing consultation Continue treatment with mirtazapine and as needed Seroquel Lithium and oxcarbazepine have been discontinued, encephalopathy was thought to be secondary to lithium overdose  HTN BP is increased over the course of the day It is unclear to me if this is because she is refusing her medications or she needs her medications titrated, will discuss with nurse and follow Meanwhile we will continue amlodipine and losartan as ordered  Debility Patient clearly has protein calorie malnutrition And she was intubated in the ICU for several days Patient is extremely unstable on her feet She does not want to go to rehab Will need to discuss with her husband as she clearly does not have capacity to make this decision  PAF Patient has pacemaker in place Does not appear to be on anticoagulation per home doses  PAD Continue aspirin, Plavix and statin  COPD No evidence for acute flare, continue maintenance inhaled bronchodilators    DVT prophylaxis: Lovenox Code Status: Full     Studies: No results found.  Principal Problem:   Lithium toxicity Active Problems:   Essential hypertension, benign   COPD (chronic obstructive pulmonary disease) (HCC)   Artificial cardiac pacemaker   Coronary artery disease   Cirrhosis of liver (HCC)   PAD (peripheral artery disease) (HCC)   Bipolar 1 disorder, mixed, full remission (HCC)   AKI (acute kidney injury) ([place])   Acute hypoxic respiratory failure (HCC)   Paroxysmal A-fib (HCC)   Epilepsy, unspecified, not intractable, without status epilepticus ([place])   Altered mental status   Toxic metabolic encephalopathy   Malnutrition of moderate degree     Cynthia [name], [facility]  If 7PM-7AM, please contact night-coverage [email or web address]   LOS: 8 days

## 2022-12-24 NOTE — Plan of Care (Signed)

## 2022-12-24 NOTE — Progress Notes (Signed)
Mobility Specialist - Progress Note    12/24/22 1334  Mobility  Activity Ambulated with assistance in hallway;Stood at bedside;Dangled on edge of bed  Level of Assistance Contact guard assist, steadying assist  Assistive Device Front wheel walker  Distance Ambulated (ft) 40 ft  Range of Motion/Exercises Active  Activity Response Tolerated well  Mobility Referral Yes  $Mobility charge 1 Mobility   Pt resting in bed on RA upon entry. Pt agreeable to ambulation with motivation. Pt STS and ambulates to hallway with close CGA due to instability +2 with chair follow. Pt given manual and verbal directional cues to avoid running into the wall and objects. Pt given constant redirection during session. Pt returned to bed and left with needs in reach and bed alarm activated. Pt sitter present and husband present during session.   Loma Sender Mobility Specialist 12/24/22, 2:26 PM

## 2022-12-25 DIAGNOSIS — T56891A Toxic effect of other metals, accidental (unintentional), initial encounter: Secondary | ICD-10-CM | POA: Diagnosis not present

## 2022-12-25 LAB — COMPREHENSIVE METABOLIC PANEL
ALT: 27 U/L (ref 0–44)
AST: 21 U/L (ref 15–41)
Albumin: 4 g/dL (ref 3.5–5.0)
Alkaline Phosphatase: 70 U/L (ref 38–126)
Anion gap: 11 (ref 5–15)
BUN: 25 mg/dL — ABNORMAL HIGH (ref 8–23)
CO2: 24 mmol/L (ref 22–32)
Calcium: 9.6 mg/dL (ref 8.9–10.3)
Chloride: 106 mmol/L (ref 98–111)
Creatinine, Ser: 1.02 mg/dL — ABNORMAL HIGH (ref 0.44–1.00)
GFR, Estimated: 60 mL/min — ABNORMAL LOW (ref >60)
Glucose, Bld: 114 mg/dL — ABNORMAL HIGH (ref 70–99)
Potassium: 4.2 mmol/L (ref 3.5–5.1)
Sodium: 141 mmol/L (ref 135–145)
Total Bilirubin: 0.7 mg/dL (ref 0.3–1.2)
Total Protein: 7.8 g/dL (ref 6.5–8.1)

## 2022-12-25 LAB — PHOSPHORUS: Phosphorus: 4.4 mg/dL (ref 2.5–4.6)

## 2022-12-25 LAB — MAGNESIUM: Magnesium: 2.3 mg/dL (ref 1.7–2.4)

## 2022-12-25 MED ORDER — HYDROCHLOROTHIAZIDE 25 MG PO TABS
25.0000 mg | ORAL_TABLET | Freq: Every day | ORAL | Status: DC
  Administered 2022-12-25: 25 mg via ORAL
  Filled 2022-12-25 (×2): qty 1

## 2022-12-25 NOTE — Progress Notes (Signed)
PROGRESS NOTE    Cynthia [name]  [id number]  DOB: February 27, 1956  DOA: 12/15/2022 PCP: Delsa Grana, PA-C Outpatient Specialists:   Hospital course:  67 year old female with bipolar 1 disorder, TBI, seizure disorder, cirrhosis, H/oh CVA, PAF and HTN with ongoing MJ use was admitted with altered mental status thought to be secondary to lithium overdose.  She has had a prolonged and complicated course requiring admission to the ICU with mechanical ventilation for prolonged encephalopathy, treatment for aspiration pneumonia, initiation of steroids for tongue swelling.  Patient was extubated and transferred to hospitalist service 12/22/2022.  Subjective:  Patient states she is tired of the food here.  Is going to try to eat but is not hungry.  I noted that her hair was wet and asked if she had it washed.  She said no she had not but RN noted that she had had it washed last night.  Patient did not remember that.   Objective: Vitals:   12/24/22 1601 12/24/22 1936 12/25/22 0357 12/25/22 0720  BP: (!) 151/87 (!) 179/64 (!) 156/80 (!) 170/63  Pulse: (!) 58 70 (!) 57 (!) 59  Resp: 18 16 16 16   Temp: (!) 97.5 F (36.4 C) 98.6 F (37 C) 98.6 F (37 C) 97.9 F (36.6 C)  TempSrc: Oral Oral Oral Oral  SpO2: 100% 100% 97% 98%  Weight:      Height:        Intake/Output Summary (Last 24 hours) at 12/25/2022 1550 Last data filed at 12/25/2022 1440 Gross per 24 hour  Intake 360 ml  Output --  Net 360 ml    Filed Weights   12/21/22 1200 12/23/22 0500 12/24/22 0442  Weight: 65.2 kg 65.2 kg 46 kg     Exam:  General: Thin female sitting up in bed with breakfast tray in front of her NAD Eyes: sclera anicteric, conjuctiva mild injection bilaterally CVS: S1-S2, regular  Respiratory:  decreased air entry bilaterally secondary to decreased inspiratory effort, rales at bases  GI: NABS, soft, NT  LE: Warm and well-perfused   Data Reviewed:  Basic Metabolic Panel: Recent Labs  Lab  12/21/22 0712 12/21/22 0713 12/22/22 0517 12/23/22 0922 12/24/22 0305 12/25/22 0319  NA  --  141 145 142 143 141  K  --  4.5 4.1 3.8 4.3 4.2  CL  --  111 112* 108 105 106  CO2  --  23 23 23 22 24   GLUCOSE  --  178* 156* 135* 124* 114*  BUN  --  27* 29* 27* 25* 25*  CREATININE  --  1.00 0.93 1.01* 0.91 1.02*  CALCIUM  --  9.6 9.3 9.4 9.5 9.6  MG 2.1  --  2.0 2.1 2.1 2.3  PHOS  --  3.8 4.0 3.9 3.5 4.4     CBC: Recent Labs  Lab 12/19/22 0328 12/20/22 0408 12/21/22 0712 12/22/22 0517  WBC 5.4 4.7 4.8 7.4  HGB 10.4* 10.3* 10.4* 9.9*  HCT 34.2* 34.3* 34.1* 33.4*  MCV 103.0* 103.6* 100.9* 104.4*  PLT 96* 117* 134* 160      Scheduled Meds:  amLODipine  10 mg Oral Daily   aspirin EC  81 mg Oral Daily   atorvastatin  20 mg Oral QHS   clopidogrel  75 mg Oral Daily   enoxaparin (LOVENOX) injection  40 mg Subcutaneous Q24H   feeding supplement (NEPRO CARB STEADY)  237 mL Oral TID BM   fluticasone furoate-vilanterol  1 puff Inhalation Daily   And  umeclidinium bromide  1 puff Inhalation Daily   losartan  100 mg Oral Daily   mirtazapine  7.5 mg Oral QHS   multivitamin with minerals  1 tablet Oral Daily   nicotine  14 mg Transdermal Daily   pantoprazole  40 mg Oral Daily   polyethylene glycol  34 g Oral Daily   senna  1 tablet Oral Daily   Continuous Infusions:  sodium chloride 10 mL/hr at 12/22/22 0959     Assessment & Plan:   Toxic/metabolic encephalopathy Bipolar 1 disorder Encephalopathy seems resolved in that she does not have a waxing and waning mental status However she does clearly have some memory deficits--did not remember getting her hair washed last night. She remains somewhat paranoid and she is clearly not making coherent decisions.. Very much appreciate psychiatric ongoing consultation Continue treatment with mirtazapine and as needed Seroquel Lithium and oxcarbazepine have been discontinued, encephalopathy was thought to be secondary to lithium  overdose  HTN BP remains elevated,  Per MAR it looks like she is in fact taking her meds Patient already on 10 mg of amlodipine and 100 mg of losartan She has baseline bradycardia so will not add a beta-blocker Now that she is off lithium, will add HCTZ 25 mg today and follow BP  Debility Patient clearly has protein calorie malnutrition And she was intubated in the ICU for several days Patient is extremely unstable on her feet She does not want to go to rehab Will need to discuss with her husband as she clearly does not have capacity to make this decision  PAF Patient has pacemaker in place Does not appear to be on anticoagulation per home doses  PAD Continue aspirin, Plavix and statin  COPD No evidence for acute flare, continue maintenance inhaled bronchodilators    DVT prophylaxis: Lovenox Code Status: Full     Studies: No results found.  Principal Problem:   Lithium toxicity Active Problems:   Essential hypertension, benign   COPD (chronic obstructive pulmonary disease) (HCC)   Artificial cardiac pacemaker   Coronary artery disease   Cirrhosis of liver (HCC)   PAD (peripheral artery disease) (HCC)   Bipolar 1 disorder, mixed, full remission (HCC)   AKI (acute kidney injury) ([place])   Acute hypoxic respiratory failure (HCC)   Paroxysmal A-fib (HCC)   Epilepsy, unspecified, not intractable, without status epilepticus ([place])   Altered mental status   Toxic metabolic encephalopathy   Malnutrition of moderate degree     Daun Rens Tublu Bodhi Moradi, [facility]  If 7PM-7AM, please contact night-coverage [email or web address]   LOS: 9 days

## 2022-12-25 NOTE — Plan of Care (Signed)
  Problem: Elimination: Goal: Will not experience complications related to urinary retention Outcome: Progressing   Problem: Safety: Goal: Ability to remain free from injury will improve Outcome: Progressing   Problem: Skin Integrity: Goal: Risk for impaired skin integrity will decrease Outcome: Progressing   

## 2022-12-26 DIAGNOSIS — T56891A Toxic effect of other metals, accidental (unintentional), initial encounter: Secondary | ICD-10-CM | POA: Diagnosis not present

## 2022-12-26 LAB — BASIC METABOLIC PANEL
Anion gap: 7 (ref 5–15)
BUN: 28 mg/dL — ABNORMAL HIGH (ref 8–23)
CO2: 24 mmol/L (ref 22–32)
Calcium: 9.4 mg/dL (ref 8.9–10.3)
Chloride: 107 mmol/L (ref 98–111)
Creatinine, Ser: 1.34 mg/dL — ABNORMAL HIGH (ref 0.44–1.00)
GFR, Estimated: 43 mL/min — ABNORMAL LOW (ref >60)
Glucose, Bld: 115 mg/dL — ABNORMAL HIGH (ref 70–99)
Potassium: 4.4 mmol/L (ref 3.5–5.1)
Sodium: 138 mmol/L (ref 135–145)

## 2022-12-26 LAB — CBC
HCT: 41.2 % (ref 36.0–46.0)
Hemoglobin: 12.9 g/dL (ref 12.0–15.0)
MCH: 30.6 pg (ref 26.0–34.0)
MCHC: 31.3 g/dL (ref 30.0–36.0)
MCV: 97.9 fL (ref 80.0–100.0)
Platelets: 234 10*3/uL (ref 150–400)
RBC: 4.21 MIL/uL (ref 3.87–5.11)
RDW: 13.2 % (ref 11.5–15.5)
WBC: 6.7 10*3/uL (ref 4.0–10.5)
nRBC: 0 % (ref 0.0–0.2)

## 2022-12-26 LAB — PHOSPHORUS: Phosphorus: 4.8 mg/dL — ABNORMAL HIGH (ref 2.5–4.6)

## 2022-12-26 LAB — MAGNESIUM: Magnesium: 2.4 mg/dL (ref 1.7–2.4)

## 2022-12-26 MED ORDER — MIRTAZAPINE 7.5 MG PO TABS: 7.5000 mg | ORAL_TABLET | Freq: Every day | ORAL | 0 refills | Status: DC

## 2022-12-26 MED ORDER — HYDROCHLOROTHIAZIDE 12.5 MG PO TABS
12.5000 mg | ORAL_TABLET | Freq: Every day | ORAL | Status: DC
  Administered 2022-12-26: 12.5 mg via ORAL

## 2022-12-26 NOTE — Discharge Summary (Signed)
Cynthia [name] [id number] DOB: 23-Sep-1956 DOA: 12/15/2022  PCP: Delsa Grana, PA-C  Admit date: 12/15/2022  Discharge date: 12/26/2022  Admitted From: Home   disposition: Home   Recommendations for Outpatient Follow-up:   Follow up with Psychiatry--Julie at [facility] in the next 3-5 days.  Follow up with  PCP in the next 5-7 days for BP and BMP check  Home Health: PT/OT/home health/ Equipment/Devices: Patient has rolling walker at home Consultations: Psychiatry, neurology, pulmonary/critical care Discharge Condition: Improved CODE STATUS: Full Diet Recommendation: Regular  Diet Order             DIET DYS 2 Room service appropriate? Yes; Fluid consistency: Thin  Diet effective now                    Chief Complaint  Patient presents with   Altered Mental Status     Brief history of present illness from the day of admission and additional interim summary     Cynthia [name] is a 67 y.o. female with medical history significant for bipolar disorder, sick sinus syndrome status post pacemaker, chronic hepatitis C, COPD, hypertension, TBI, seizure disorder, paroxysmal atrial fibrillation, CVA, CAD on Plavix, polysubstance abuse ( marijuana), who presents to the ED with c/o altered mental status.   Per ED reports, patient's husband reported to EMS that patient was having worsening fatigue and tremors that started around 7 PM last night.  He attempted to take her to urgent care but it was closed so he drove back home.  Due to worsening altered mental status EMS was called and on arrival patient was found to be drowsy with pinpoint pupils.  There was concerns that patient may have taken a dose of oxycodone at around 8 PM.     ED Course: Initial vital signs showed HR of 58 beats/minute, BP 146/55 mm Hg,  the RR 15 breaths/minute, and the oxygen saturation 97% on  NRB and a temperature of 98.19F (36.9C). Patient was lethargic, moaned intermittently, and responded with one-word answers; symmetric movement in the arms and legs was observed.  She had received Narcan IV 0.5 mg and 1L bolus of NS.  Patient remained altered even with a second dose of IV Narcan.  On reassessment she was noted with gurgling respirations due to concerns for inability to protect her airway and pulmonary congestion she was intubated for airway protection.                                                                   Hospital Course   Patient was admitted to the ICU you with acute toxic metabolic encephalopathy secondary to suspected drug overdose including lithium and or oxycodone.  Patient was intubated for airway protection.  She was however subsequently diagnosed with an aspiration  pneumonia and was treated with 5 days of Zosyn.  2 days after intubation her course was complicated by severe's tongue swelling which was treated with steroids.  Patient was extubated on 12/21/2022 however remained encephalopathic.  Husband noted that at baseline she has a history of TBI and memory deficits with some intermittent confusion.  Patient was subsequently transferred to the floor to complete her treatment for aspiration pneumonia.    Psychiatry was consulted about resumption of her medications.  NP Richardson Landry spoke with Almyra Free at patient's outpatient psychiatric clinic [facility].  After discussion about inpatient events, Almyra Free who is patient's outpatient provider, recommended discontinuation of lithium, oxcarbazepine and Seroquel.  Instead she was started on mirtazapine.  Almyra Free stated she would follow-up with patient as an outpatient on the mirtazapine and titrate medications as needed.  Once off the major tranquilizers and on the mirtazapine, patient slowly improved and encephalopathy resolved.  She was noted to have a gait instability  upon evaluation by PT and was initially recommended to go to rehab/SNF.  However with ongoing inpatient PT, patient was able to get back to her baseline gait.  Patient was discharged home to be accompanied by her husband and son.  Home health services including PT/OT/RN were reinstituted.   Toxic/metabolic encephalopathy Bipolar 1 disorder Encephalopathy seems resolved in that she does not have a waxing and waning mental status However she does clearly have some memory deficits--which are apparently baseline given TBI Continue treatment with mirtazapine. Lithium and oxcarbazepine have been discontinued, encephalopathy was thought to be secondary to lithium overdose   HTN BP remains elevated,  Per MAR it looks like she is in fact taking her meds Patient already on 10 mg of amlodipine and 100 mg of losartan She has baseline bradycardia so will not add a beta-blocker HCTZ started however it was discontinued after mild rise in creatinine. Further management HTN per her outpatient PCP.  Debility Patient stability has improved significantly with inpatient PT and better nutrition On day of discharge patient is back to her baseline and able to walk with minimal support although still listing to the left which is apparently also her baseline.   PAF Patient has pacemaker in place Does not appear to be on anticoagulation per home doses   PAD Continue aspirin, Plavix and statin   COPD No evidence for acute flare, continue maintenance inhaled bronchodilators  Discharge diagnosis     Principal Problem:   Lithium toxicity Active Problems:   Essential hypertension, benign   COPD (chronic obstructive pulmonary disease)   Artificial cardiac pacemaker   Coronary artery disease   Cirrhosis of liver   PAD (peripheral artery disease)   Bipolar 1 disorder, mixed, full remission   AKI (acute kidney injury)   Acute hypoxic respiratory failure   Paroxysmal A-fib   Epilepsy, unspecified, not  intractable, without status epilepticus   Altered mental status   Toxic metabolic encephalopathy   Malnutrition of moderate degree    Discharge instructions    Discharge Instructions     Discharge instructions   Complete by: As directed    Because you came in so confused, a lot of your medications have changed.  Our psychiatrists have spoken with Almyra Free, your outpatient provider at [facility] in [place].  She wanted you to stop Lithium, Seroquel and Trazodone.  She wanted you to start on Mirtazapine, and I am giving you a 30-day prescription for this.  She wants to see you after your discharge. Make sure  you call her when you get home and let her know that you have been discharged and that you need to see her in the next couple of days.  She knows that you have been in the hospital and she is looking forward to seeing you.   Increase activity slowly   Complete by: As directed        Discharge Medications   Allergies as of 12/26/2022       Reactions   Morphine Anaphylaxis   Cardiac arrest        Medication List     STOP taking these medications    gabapentin 100 MG capsule Commonly known as: NEURONTIN   hydrOXYzine 25 MG tablet Commonly known as: ATARAX   lithium carbonate 300 MG capsule   Oxcarbazepine 300 MG tablet Commonly known as: TRILEPTAL   QUEtiapine 100 MG tablet Commonly known as: SEROQUEL   traZODone 50 MG tablet Commonly known as: DESYREL       TAKE these medications    albuterol 108 (90 Base) MCG/ACT inhaler Commonly known as: VENTOLIN HFA INHALE 2 PUFFS INTO THE LUNGS EVERY 6 HOURS AS NEEDED FOR WHEEZING OR SHORTNESS OF BREATH   amLODipine 10 MG tablet Commonly known as: NORVASC TAKE 1 TABLET BY MOUTH ONCE DAILY   aspirin EC 81 MG tablet Take 1 tablet (81 mg total) by mouth daily.   atorvastatin 20 MG tablet Commonly known as: LIPITOR TAKE 1 TABLET BY MOUTH AT BEDTIME   clopidogrel 75 MG tablet Commonly known as:  PLAVIX TAKE 1 TABLET BY MOUTH ONCE DAILY   Glycopyrrolate-Formoterol 9-4.8 MCG/ACT Aero Inhale 2 puffs into the lungs 2 (two) times daily.   losartan 100 MG tablet Commonly known as: COZAAR Take 1 tablet (100 mg total) by mouth daily.   mirtazapine 7.5 MG tablet Commonly known as: REMERON Take 1 tablet (7.5 mg total) by mouth at bedtime.   nitroGLYCERIN 0.4 MG SL tablet Commonly known as: NITROSTAT Place 1 tablet (0.4 mg total) under the tongue every 5 (five) minutes as needed for chest pain. Maximum of 3 pills; call 911 at first sign of chest pain   Trelegy Ellipta 100-62.5-25 MCG/ACT Aepb Generic drug: Fluticasone-Umeclidin-Vilant INHALE 1 PUFF INTO THE LUNGS DAILY   triamcinolone cream 0.1 % Commonly known as: KENALOG Apply 1 Application topically 2 (two) times daily.          Major procedures and Radiology Reports - PLEASE review detailed and final reports thoroughly  -      DG Chest Port 1 View  Result Date: 12/22/2022 CLINICAL DATA:  Shortness of breath. EXAM: PORTABLE CHEST 1 VIEW COMPARISON:  12/17/2022 FINDINGS: LEFT-sided transvenous pacemaker leads overlie the RIGHT atrium and RIGHT ventricle. The heart size is normal. There are mild patchy opacities at the lung bases, RIGHT greater than LEFT and slightly increased compared to prior study. Small bilateral pleural effusions are present. IMPRESSION: 1. Slight increase in bibasilar opacities, RIGHT greater than LEFT. 2. Small bilateral pleural effusions. Electronically Signed   By: Nolon Nations M.D.   On: 12/22/2022 07:42   DG Abd 1 View  Result Date: 12/20/2022 CLINICAL DATA:  Nasogastric placement EXAM: ABDOMEN - 1 VIEW COMPARISON:  None Available. FINDINGS: Two tubes are in place, 1 with the tip at the body antral junction in the other with the tip in the fundus. Pacemaker leads are evident. Arterial stents are present in the iliac regions. Amount of intestinal gas is not outside of the range of normal as  seen  on this limited film. IMPRESSION: Two tubes, 1 with the tip at the body antral junction in the other with the tip in the fundus. Electronically Signed   By: Nelson Chimes M.D.   On: 12/20/2022 08:50   EEG adult  Result Date: 12/19/2022 Derek Jack, MD     12/19/2022  7:59 PM Routine EEG Report Cynthia [name] is a 67 y.o. female with a history of altered mental status who is undergoing an EEG to evaluate for seizures. Report: This EEG was acquired with electrodes placed according to the International 10-20 electrode system (including Fp1, Fp2, F3, F4, C3, C4, P3, P4, O1, O2, T3, T4, T5, T6, A1, A2, Fz, Cz, Pz). The following electrodes were missing or displaced: none. The background consisted of diffuse slowing at 3-6 Hz. Frequent triphasic waves were noted which at times became briefly periodic at 1 Hz. These did not appear epileptiform. There was no clear waking rhythm; deeper stages of sleep were identified by K complexes and sleep spindles. There was no focal slowing. There were no interictal epileptiform discharges. There were no electrographic seizures identified. Photic stimulation and hyperventilation were not performed. Impression and clinical correlation: This EEG was obtained while sedated on precedex and is abnormal due to moderate-to-severe diffuse slowing indicative of global cerebral dysfunction, medication effect, or both. Triphasic waves are indicative of metabolic encephalopathy. Epileptiform abnormalities were not seen during this recording. Su Monks, MD [facility] [phone] If 7pm- 7am, please page neurology on call as listed in [facility].   Rapid EEG  Result Date: 12/18/2022 Lora Havens, MD     12/19/2022  2:29 PM Patient Name: Cynthia [name] MRN: [id number] Epilepsy Attending: Lora Havens Referring Physician/Provider: Armando Reichert, MD   Duration: 12/18/2022 1811 to 12/19/2022 1243 Patient history: 67yo F with ams. EEG to evaluate for seizure Level of  alertness: comatose AEDs during EEG study: Propofol Technical aspects:This EEG was obtained using a 10 lead EEG system positioned circumferentially without any parasagittal coverage (rapid EEG). Computer selected EEG is reviewed as  well as background features and all clinically significant events. Description: EEG showed continuous generalized 3 to 6 Hz theta-delta slowing. At times, generalized periodic discharges with triphasic morphology were also noted at 1hz . Hyperventilation and photic stimulation were not performed.   ABNORMALITY - Periodic discharges, generalized - Continuous slow, generalized IMPRESSION: This limited ceribell EEG is suggestive of moderate diffuse encephalopathy, nonspecific etiology but could be secondary to toxic-metabolic causes. No seizures or definite epileptiform discharges were seen throughout the recording. If suspicion for ictal-interictal activity remains a concern, a conventional EEG can be considered. Lora Havens   DG Chest Port 1 View  Result Date: 12/17/2022 CLINICAL DATA:  Increased tracheal secretions EXAM: PORTABLE CHEST - 1 VIEW COMPARISON:  the previous day's study FINDINGS: Endotracheal tube and gastric tube stable in position. Left subclavian pacing leads stable. Mild pulmonary interstitial prominence as before. Some patchy airspace opacities at the left lung base slightly more conspicuous. Heart size and mediastinal contours are within normal limits. Aortic Atherosclerosis (ICD10-170.0). Blunting of the left lateral costophrenic angle. Visualized bones unremarkable. IMPRESSION: 1. Slight increase in left lower lobe airspace disease. 2. Stable support hardware. Electronically Signed   By: Lucrezia Europe M.D.   On: 12/17/2022 08:55   ECHOCARDIOGRAM COMPLETE  Result Date: 12/16/2022    ECHOCARDIOGRAM REPORT   Patient Name:   FRANCYS GUAGENTI Date of Exam: 12/16/2022 Medical Rec #:  [id number]      Height:  65.0 in Accession #:    [id number]     Weight:        129.9 lb Date of Birth:  May 03, 1956      BSA:          1.646 m Patient Age:    5 years       BP:           100/54 mmHg Patient Gender: F              HR:           55 bpm. Exam Location:  [facility] Procedure: 2D Echo, Cardiac Doppler and Color Doppler Indications:     Abnormal ECG R 94.31  History:         Patient has prior history of Echocardiogram examinations, most                  recent 03/24/2020. Pacemaker, COPD; Risk Factors:Hypertension.                  Tobacco use.  Sonographer:     [name] Referring Phys:  [id number] Bradly Bienenstock Diagnosing Phys: Isaias Cowman MD IMPRESSIONS  1. Left ventricular ejection fraction, by estimation, is 60 to 65%. The left ventricle has normal function. The left ventricle has no regional wall motion abnormalities. There is mild left ventricular hypertrophy. Left ventricular diastolic parameters were normal.  2. Right ventricular systolic function is normal. The right ventricular size is normal.  3. The mitral valve is normal in structure. Mild to moderate mitral valve regurgitation. No evidence of mitral stenosis.  4. The aortic valve is normal in structure. Aortic valve regurgitation is not visualized. No aortic stenosis is present.  5. The inferior vena cava is normal in size with greater than 50% respiratory variability, suggesting right atrial pressure of 3 mmHg. FINDINGS  Left Ventricle: Left ventricular ejection fraction, by estimation, is 60 to 65%. The left ventricle has normal function. The left ventricle has no regional wall motion abnormalities. The left ventricular internal cavity size was normal in size. There is  mild left ventricular hypertrophy. Left ventricular diastolic parameters were normal. Right Ventricle: The right ventricular size is normal. No increase in right ventricular wall thickness. Right ventricular systolic function is normal. Left Atrium: Left atrial size was normal in size. Right Atrium: Right atrial size was normal in size.  Pericardium: There is no evidence of pericardial effusion. Mitral Valve: The mitral valve is normal in structure. Mild to moderate mitral valve regurgitation. No evidence of mitral valve stenosis. MV peak gradient, 3.4 mmHg. The mean mitral valve gradient is 1.0 mmHg. Tricuspid Valve: The tricuspid valve is normal in structure. Tricuspid valve regurgitation is mild . No evidence of tricuspid stenosis. Aortic Valve: The aortic valve is normal in structure. Aortic valve regurgitation is not visualized. No aortic stenosis is present. Aortic valve mean gradient measures 4.0 mmHg. Aortic valve peak gradient measures 7.0 mmHg. Aortic valve area, by VTI measures 3.36 cm. Pulmonic Valve: The pulmonic valve was normal in structure. Pulmonic valve regurgitation is not visualized. No evidence of pulmonic stenosis. Aorta: The aortic root is normal in size and structure. Venous: The inferior vena cava is normal in size with greater than 50% respiratory variability, suggesting right atrial pressure of 3 mmHg. IAS/Shunts: No atrial level shunt detected by color flow Doppler. Additional Comments: A device lead is visualized.  LEFT VENTRICLE PLAX 2D LVIDd:         3.70 cm  Diastology LVIDs:         2.50 cm   LV e' medial:    8.16 cm/s LV PW:         1.40 cm   LV E/e' medial:  7.6 LV IVS:        1.40 cm   LV e' lateral:   10.10 cm/s LVOT diam:     2.00 cm   LV E/e' lateral: 6.1 LV SV:         84 LV SV Index:   51 LVOT Area:     3.14 cm  RIGHT VENTRICLE RV Basal diam:  4.30 cm RV Mid diam:    2.80 cm RV S prime:     11.60 cm/s TAPSE (M-mode): 2.0 cm LEFT ATRIUM           Index        RIGHT ATRIUM           Index LA diam:      3.40 cm 2.07 cm/m   RA Area:     15.80 cm LA Vol (A2C): 25.5 ml 15.49 ml/m  RA Volume:   38.70 ml  23.51 ml/m LA Vol (A4C): 16.0 ml 9.72 ml/m  AORTIC VALVE AV Area (Vmax):    3.07 cm AV Area (Vmean):   2.85 cm AV Area (VTI):     3.36 cm AV Vmax:           132.00 cm/s AV Vmean:          93.900 cm/s AV  VTI:            0.250 m AV Peak Grad:      7.0 mmHg AV Mean Grad:      4.0 mmHg LVOT Vmax:         129.00 cm/s LVOT Vmean:        85.200 cm/s LVOT VTI:          0.267 m LVOT/AV VTI ratio: 1.07  AORTA Ao Root diam: 2.80 cm MITRAL VALVE               TRICUSPID VALVE MV Area (PHT): 2.13 cm    TR Peak grad:   25.0 mmHg MV Area VTI:   3.06 cm    TR Vmax:        250.00 cm/s MV Peak grad:  3.4 mmHg MV Mean grad:  1.0 mmHg    SHUNTS MV Vmax:       0.92 m/s    Systemic VTI:  0.27 m MV Vmean:      44.0 cm/s   Systemic Diam: 2.00 cm MV Decel Time: 356 msec MV E velocity: 61.80 cm/s MV A velocity: 58.20 cm/s MV E/A ratio:  1.06 Isaias Cowman MD Electronically signed by Isaias Cowman MD Signature Date/Time: 12/16/2022/3:35:43 PM    Final    DG Chest Portable 1 View  Result Date: 12/16/2022 CLINICAL DATA:  Ventilator dependent respiratory failure. EXAM: PORTABLE CHEST 1 VIEW COMPARISON:  Portable chest earlier today at 12:24 a.m. FINDINGS: 2:52 a.m. ETT interval insertion with tip 3.1 cm from the carina. NGT adequately intragastric abutting the far wall of the mid gastric body. Left chest dual lead pacing system and wire insertions are unaltered. The heart is slightly enlarged. Central vascular prominence has improved. There is still mild interstitial edema in the lung bases but it has regressed from the upper and central lungs, with improvement. No substantial pleural effusion is seen and no acute airspace infiltrate. Mild background chronic  change again noted. The mediastinum is normally outlined. There is moderate calcification of the transverse aorta. No acute osseous finding. IMPRESSION: 1. Support apparatus in satisfactory position. 2. Improved central vascular prominence and interstitial edema. 3. No acute airspace infiltrate or substantial pleural effusion. Mild background chronic change. 4. Aortic atherosclerosis. Electronically Signed   By: Telford Nab M.D.   On: 12/16/2022 03:14   DG Chest  Portable 1 View  Result Date: 12/16/2022 CLINICAL DATA:  Shortness of breath. EXAM: PORTABLE CHEST 1 VIEW COMPARISON:  Chest radiograph dated 07/08/2021 and CT dated 01/13/2022. FINDINGS: Mild central vascular congestion. Diffuse interstitial and peribronchial densities may be chronic. Atypical infection is not excluded. No focal consolidation, pleural effusion, or pneumothorax. The cardiac silhouette is within limits. Atherosclerotic calcification of the left pectoral pacemaker device. No acute osseous pathology. IMPRESSION: Mild central vascular congestion.  No focal consolidation. Electronically Signed   By: Anner Crete M.D.   On: 12/16/2022 00:51   CT Head Wo Contrast  Result Date: 12/15/2022 CLINICAL DATA:  Fatigue and tremors. EXAM: CT HEAD WITHOUT CONTRAST TECHNIQUE: Contiguous axial images were obtained from the base of the skull through the vertex without intravenous contrast. RADIATION DOSE REDUCTION: This exam was performed according to the departmental dose-optimization program which includes automated exposure control, adjustment of the mA and/or kV according to patient size and/or use of iterative reconstruction technique. COMPARISON:  April 02, 2020 FINDINGS: Brain: There is generalized cerebral atrophy with widening of the extra-axial spaces and ventricular dilatation. There are areas of decreased attenuation within the white matter tracts of the supratentorial brain, consistent with microvascular disease changes. Vascular: No hyperdense vessel or unexpected calcification. Skull: Normal. Negative for fracture or focal lesion. Sinuses/Orbits: There is mild bilateral ethmoid sinus mucosal thickening. A stable frontal sinus osteoma is noted. Other: None. IMPRESSION: 1. No acute intracranial abnormality. 2. Generalized cerebral atrophy with chronic white matter small vessel ischemic changes. Electronically Signed   By: Virgina Norfolk M.D.   On: 12/15/2022 22:23    Micro Results    Recent  Results (from the past 240 hour(s))  Culture, Respiratory w Gram Stain     Status: None   Collection Time: 12/17/22  8:41 AM   Specimen: Tracheal Aspirate; Respiratory  Result Value Ref Range Status   Specimen Description   Final    TRACHEAL ASPIRATE Performed at [facility], [address] [place], [place] [address]    Special Requests   Final    NONE Performed at [facility], [address] [place], [place] [address]    Gram Stain   Final    MODERATE WBC PRESENT, PREDOMINANTLY PMN FEW GRAM NEGATIVE RODS FEW GRAM POSITIVE COCCI    Culture   Final    RARE Normal respiratory flora-no Staph aureus or Pseudomonas seen Performed at [facility] [address], [place], [place] [address]    Report Status 12/20/2022 FINAL  Final  MRSA Next Gen by PCR, Nasal     Status: None   Collection Time: 12/17/22 11:20 AM   Specimen: Nasal Mucosa; Nasal Swab  Result Value Ref Range Status   MRSA by PCR Next Gen NOT DETECTED NOT DETECTED Final    Comment: (NOTE) The GeneXpert MRSA Assay (FDA approved for NASAL specimens only), is one component of a comprehensive MRSA colonization surveillance program. It is not intended to diagnose MRSA infection nor to guide or monitor treatment for MRSA infections. Test performance is not FDA approved in patients less  than 15 years old. Performed at [facility], [address] [place], [place] [address]     Today   Subjective    Cynthia [name] feels much improved since admission.  Feels ready to go home.  Denies chest pain, shortness of breath or abdominal pain.  Feels they can take care of themselves with the resources they have at home.  Objective   Blood pressure (!) 140/63, pulse 62, temperature 97.8 F (36.6 C), temperature source Oral, resp. rate 18, height 5\' 5"  (1.651 m), weight 47 kg, SpO2 96 %.   Intake/Output Summary (Last 24 hours) at 12/26/2022 1438 Last data filed at 12/25/2022 1800 Gross per  24 hour  Intake 540 ml  Output --  Net 540 ml    Exam General: Patient appears well and in good spirits sitting up in bed in no acute distress.  Eyes: sclera anicteric, conjuctiva mild injection bilaterally CVS: S1-S2, regular  Respiratory:  decreased air entry bilaterally secondary to decreased inspiratory effort, rales at bases  GI: NABS, soft, NT  LE: No edema.  Neuro: A/O x 3, Moving all extremities equally with normal strength, CN 3-12 intact, grossly nonfocal.  Psych: patient is logical and coherent, judgement and insight appear normal, mood and affect appropriate to situation.    Data Review   CBC w Diff:  Lab Results  Component Value Date   WBC 6.7 12/26/2022   HGB 12.9 12/26/2022   HGB 16.1 (H) 09/17/2015   HCT 41.2 12/26/2022   HCT 47.1 (H) 09/17/2015   PLT 234 12/26/2022   PLT 167 09/17/2015   LYMPHOPCT 25 [id number]   BANDSPCT DUPLICATE REQUEST [id number]   MONOPCT 5 12/15/2022   EOSPCT 3 12/15/2022   BASOPCT 1 12/15/2022    CMP:  Lab Results  Component Value Date   NA 138 12/26/2022   NA 154 (A) 12/10/2021   K 4.4 12/26/2022   CL 107 12/26/2022   CO2 24 12/26/2022   BUN 28 (H) 12/26/2022   BUN 17 12/10/2021   CREATININE 1.34 (H) 12/26/2022   CREATININE 1.30 (H) 12/06/2022   GLU 87 12/10/2021   PROT 7.8 12/25/2022   PROT 7.7 06/25/2019   ALBUMIN 4.0 12/25/2022   ALBUMIN 4.5 06/25/2019   BILITOT 0.7 12/25/2022   BILITOT 0.5 06/25/2019   ALKPHOS 70 12/25/2022   AST 21 12/25/2022   ALT 27 12/25/2022  .   Total Time in preparing paper work, data evaluation and todays exam - 35 minutes  Vashti Hey M.D on 12/26/2022 at 2:38 PM  [facility]

## 2022-12-26 NOTE — TOC Progression Note (Signed)
Transition of Care [facility]) - Progression Note    Patient Details  Name: Cynthia [name] MRN: [id number] Date of Birth: 01-03-1956  Transition of Care [facility]) CM/SW Contact  Beverly Sessions, RN Phone Number: 12/26/2022, 10:01 AM  Clinical Narrative:    Merleen Nicely with [facility] confirmed patient is open with PT, OT, SW and aide  Messages sent to PT to obtained recommendations for any home DME after next session    Expected Discharge Plan:  (TBD) Barriers to Discharge: Continued Medical Work up  Expected Discharge Plan and Services       Living arrangements for the past 2 months: Single Family Home                                       Social Determinants of Health (SDOH) Interventions SDOH Screenings   Food Insecurity: No Food Insecurity (06/21/2022)  Housing: Low Risk  (06/21/2022)  Transportation Needs: No Transportation Needs (06/21/2022)  Utilities: Not At Risk (06/21/2022)  Alcohol Screen: Low Risk  (09/20/2022)  Depression (PHQ2-9): High Risk (12/06/2022)  Financial Resource Strain: Low Risk  (06/21/2022)  Physical Activity: Insufficiently Active (06/21/2022)  Social Connections: Moderately Isolated (06/21/2022)  Stress: No Stress Concern Present (06/21/2022)  Tobacco Use: High Risk (12/06/2022)    Readmission Risk Interventions     No data to display

## 2022-12-26 NOTE — Progress Notes (Signed)
Discharge instructions reviewed with the patient and her husband. Patient sent out via wheelchair with belongings to waiting car

## 2022-12-26 NOTE — Care Management Important Message (Signed)
Important Message  Patient Details  Name: Cynthia [name] MRN: [id number] Date of Birth: 04/09/1956   Medicare Important Message Given:  Yes     [name] 12/26/2022, 12:43 PM

## 2022-12-26 NOTE — TOC Transition Note (Signed)
Transition of Care [facility]) - CM/SW Discharge Note   Patient Details  Name: Cynthia [name] MRN: [id number] Date of Birth: Oct 02, 1955  Transition of Care [facility]) CM/SW Contact:  Beverly Sessions, RN Phone Number: 12/26/2022, 3:15 PM   Clinical Narrative:      Notified patient to discharge today and husband to transport Requested MD to order resumption of home health referrals.  Merleen Nicely with [facility] notified of discharge    Barriers to Discharge: Continued Medical Work up   Patient Goals and CMS Choice CMS Medicare.gov Compare Post Acute Care list provided to:: Patient Represenative (must comment) (husband)    Discharge Placement                         Discharge Plan and Services Additional resources added to the After Visit Summary for                                       Social Determinants of Health (SDOH) Interventions SDOH Screenings   Food Insecurity: No Food Insecurity (06/21/2022)  Housing: Low Risk  (06/21/2022)  Transportation Needs: No Transportation Needs (06/21/2022)  Utilities: Not At Risk (06/21/2022)  Alcohol Screen: Low Risk  (09/20/2022)  Depression (PHQ2-9): High Risk (12/06/2022)  Financial Resource Strain: Low Risk  (06/21/2022)  Physical Activity: Insufficiently Active (06/21/2022)  Social Connections: Moderately Isolated (06/21/2022)  Stress: No Stress Concern Present (06/21/2022)  Tobacco Use: High Risk (12/06/2022)     Readmission Risk Interventions     No data to display

## 2022-12-26 NOTE — Progress Notes (Signed)
Physical Therapy Treatment Patient Details Name: Cynthia [name] MRN: [id number] DOB: [birth year] Today's Date: 12/26/2022   History of Present Illness Pt admitted for AMS with history including bipolar, pacemaker, COPD, HTN, TBI, seizures, and polysubstance abuse. Pt intubated 3/22-3/27.    PT Comments    Pt seen for PT tx with pt requiring encouragement/education for participation. Pt requires assistance to initiate bed mobility but is able to complete sit>supine with supervision. Pt performs STS from EOB & toilet with CGA<>Min assist and ambulates 1 lap around nurses station with RW & up to min assist. Pt constantly veering L during gait despite verbal/visual cuing from PT for linear path; pt's husband present & reports this is baseline. Due to pt's improving progress, have updated DME recommendations (no DME needs as pt has RW & rollator at home).    Recommendations for follow up therapy are one component of a multi-disciplinary discharge planning process, led by the attending physician.  Recommendations may be updated based on patient status, additional functional criteria and insurance authorization.  Follow Up Recommendations  Can patient physically be transported by private vehicle: Yes    Assistance Recommended at Discharge Frequent or constant Supervision/Assistance  Patient can return home with the following A little help with walking and/or transfers;A little help with bathing/dressing/bathroom;Assistance with cooking/housework;Assist for transportation;Help with stairs or ramp for entrance   Equipment Recommendations  None recommended by PT (pt has RW & rollator at home)    Recommendations for Other Services       Precautions / Restrictions Precautions Precautions: Fall Restrictions Weight Bearing Restrictions: No     Mobility  Bed Mobility Overal bed mobility: Needs Assistance Bed Mobility: Supine to Sit     Supine to sit: Min assist (assistance to initiate) Sit to  supine: Supervision, HOB elevated        Transfers Overall transfer level: Needs assistance Equipment used: Rolling walker (2 wheels) Transfers: Sit to/from Stand Sit to Stand: Min assist, Min guard           General transfer comment: STS from EOB & toilet in    Ambulation/Gait Ambulation/Gait assistance: Min guard, Min assist Gait Distance (Feet): 175 Feet   Gait Pattern/deviations: Decreased step length - right, Decreased step length - left, Decreased stride length Gait velocity: slightly decreased     General Gait Details: Pt ambulates in room/bathroom with RW & CGA, then ambulates 1 lap around nurses station with up to min assist with RW. Pt constantly veering L during gait despite PT providing verbal/visual cuing for linear path. Pt with decreased awareness of veering to L, requiring up to min assist at times for RW management.   Stairs             Wheelchair Mobility    Modified Rankin (Stroke Patients Only)       Balance Overall balance assessment: Needs assistance Sitting-balance support: Bilateral upper extremity supported, Feet supported Sitting balance-Leahy Scale: Good Sitting balance - Comments: Pt performs peri hygiene on toilet with supervision.   Standing balance support: No upper extremity supported, During functional activity Standing balance-Leahy Scale: Fair Standing balance comment: static standing at sink to perform hand hygiene with close supervision                            Cognition Arousal/Alertness: Awake/alert Behavior During Therapy: Impulsive Overall Cognitive Status: Impaired/Different from baseline  General Comments: Pt follows simple commands with extra time during session, requires encouragement/education for participation, pt endorsing fatigue/sleepiness during session. Decreased overall safety awareness.        Exercises      General Comments General  comments (skin integrity, edema, etc.): Pt with continent void on toilet with extra time      Pertinent Vitals/Pain Pain Assessment Pain Assessment: No/denies pain    Home Living                          Prior Function            PT Goals (current goals can now be found in the care plan section) Acute Rehab PT Goals Patient Stated Goal: to get stronger PT Goal Formulation: With patient Time For Goal Achievement: 01/04/23 Potential to Achieve Goals: Fair Additional Goals Additional Goal #1: Pt will be able to perform bed mobility/transfers with supervision and safe technique in order to improve functional independence Progress towards PT goals: Progressing toward goals    Frequency    Min 2X/week      PT Plan Discharge plan needs to be updated;Equipment recommendations need to be updated    Co-evaluation              AM-PAC PT "6 Clicks" Mobility   Outcome Measure  Help needed turning from your back to your side while in a flat bed without using bedrails?: None Help needed moving from lying on your back to sitting on the side of a flat bed without using bedrails?: A Little Help needed moving to and from a bed to a chair (including a wheelchair)?: A Little Help needed standing up from a chair using your arms (e.g., wheelchair or bedside chair)?: A Little Help needed to walk in hospital room?: A Little Help needed climbing 3-5 steps with a railing? : A Little 6 Click Score: 19    End of Session   Activity Tolerance: Patient limited by fatigue;Patient tolerated treatment well Patient left: in bed;with call bell/phone within reach;with bed alarm set;with family/visitor present;with nursing/sitter in room   PT Visit Diagnosis: Unsteadiness on feet (R26.81);Muscle weakness (generalized) (M62.81);Difficulty in walking, not elsewhere classified (R26.2)     Time: [id number] PT Time Calculation (min) (ACUTE ONLY): 17 min  Charges:  $Therapeutic Activity:  8-22 mins                     Lavone Nian, PT, DPT 12/26/22, 10:49 AM   Waunita Schooner 12/26/2022, 10:48 AM

## 2022-12-27 ENCOUNTER — Telehealth: Payer: Self-pay | Admitting: *Deleted

## 2022-12-27 ENCOUNTER — Ambulatory Visit: Payer: Self-pay

## 2022-12-27 ENCOUNTER — Other Ambulatory Visit: Payer: Self-pay | Admitting: Acute Care

## 2022-12-27 DIAGNOSIS — I739 Peripheral vascular disease, unspecified: Secondary | ICD-10-CM | POA: Diagnosis not present

## 2022-12-27 DIAGNOSIS — R29898 Other symptoms and signs involving the musculoskeletal system: Secondary | ICD-10-CM | POA: Diagnosis not present

## 2022-12-27 DIAGNOSIS — I10 Essential (primary) hypertension: Secondary | ICD-10-CM | POA: Diagnosis not present

## 2022-12-27 DIAGNOSIS — E785 Hyperlipidemia, unspecified: Secondary | ICD-10-CM | POA: Diagnosis not present

## 2022-12-27 DIAGNOSIS — E43 Unspecified severe protein-calorie malnutrition: Secondary | ICD-10-CM | POA: Diagnosis not present

## 2022-12-27 DIAGNOSIS — Z122 Encounter for screening for malignant neoplasm of respiratory organs: Secondary | ICD-10-CM

## 2022-12-27 DIAGNOSIS — G8929 Other chronic pain: Secondary | ICD-10-CM | POA: Diagnosis not present

## 2022-12-27 DIAGNOSIS — G629 Polyneuropathy, unspecified: Secondary | ICD-10-CM | POA: Diagnosis not present

## 2022-12-27 DIAGNOSIS — Z87891 Personal history of nicotine dependence: Secondary | ICD-10-CM

## 2022-12-27 DIAGNOSIS — S069X9S Unspecified intracranial injury with loss of consciousness of unspecified duration, sequela: Secondary | ICD-10-CM | POA: Diagnosis not present

## 2022-12-27 DIAGNOSIS — J4489 Other specified chronic obstructive pulmonary disease: Secondary | ICD-10-CM | POA: Diagnosis not present

## 2022-12-27 DIAGNOSIS — F17210 Nicotine dependence, cigarettes, uncomplicated: Secondary | ICD-10-CM

## 2022-12-27 DIAGNOSIS — Z7982 Long term (current) use of aspirin: Secondary | ICD-10-CM | POA: Diagnosis not present

## 2022-12-27 DIAGNOSIS — I480 Paroxysmal atrial fibrillation: Secondary | ICD-10-CM | POA: Diagnosis not present

## 2022-12-27 DIAGNOSIS — I2510 Atherosclerotic heart disease of native coronary artery without angina pectoris: Secondary | ICD-10-CM | POA: Diagnosis not present

## 2022-12-27 DIAGNOSIS — J4550 Severe persistent asthma, uncomplicated: Secondary | ICD-10-CM | POA: Diagnosis not present

## 2022-12-27 DIAGNOSIS — H9192 Unspecified hearing loss, left ear: Secondary | ICD-10-CM | POA: Diagnosis not present

## 2022-12-27 DIAGNOSIS — Z7951 Long term (current) use of inhaled steroids: Secondary | ICD-10-CM | POA: Diagnosis not present

## 2022-12-27 DIAGNOSIS — Z7902 Long term (current) use of antithrombotics/antiplatelets: Secondary | ICD-10-CM | POA: Diagnosis not present

## 2022-12-27 DIAGNOSIS — M25559 Pain in unspecified hip: Secondary | ICD-10-CM | POA: Diagnosis not present

## 2022-12-27 DIAGNOSIS — Z950 Presence of cardiac pacemaker: Secondary | ICD-10-CM | POA: Diagnosis not present

## 2022-12-27 NOTE — Transitions of Care (Post Inpatient/ED Visit) (Signed)
   12/27/2022  Name: Cynthia [name] MRN: [id number] DOB: [birth year]  Today's TOC FU Call Status: Today's TOC FU Call Status:: Successful TOC FU Call Competed TOC FU Call Complete Date: 12/27/22  Transition Care Management Follow-up Telephone Call Date of Discharge: 12/26/22 Discharge Facility: [facility]) Type of Discharge: Inpatient Admission Primary Inpatient Discharge Diagnosis:: lithium toxicity How have you been since you were released from the hospital?: Better Any questions or concerns?: No  Items Reviewed: Did you receive and understand the discharge instructions provided?: Yes Medications obtained and verified?: Yes (Medications Reviewed) Any new allergies since your discharge?: No Dietary orders reviewed?: No Do you have support at home?: Yes People in Home: spouse Name of Support/Comfort Primary Source: [facility] and Equipment/Supplies: [place] Ordered?: No Any new equipment or medical supplies ordered?: No  Functional Questionnaire: Do you need assistance with bathing/showering or dressing?: No Do you need assistance with meal preparation?: Yes Do you need assistance with eating?: No Do you have difficulty maintaining continence: No Do you need assistance with getting out of bed/getting out of a chair/moving?: No Do you have difficulty managing or taking your medications?: No  Follow up appointments reviewed: PCP Follow-up appointment confirmed?: Yes Date of PCP follow-up appointment?: 01/04/23 Follow-up Provider: [facility] Follow-up appointment confirmed?: Yes Date of Specialist follow-up appointment?: 01/11/23 Follow-Up Specialty Provider:: Neuro 10:30. Per husband he has set an appointment up with the therapist Do you need transportation to your follow-up appointment?: No Do you understand care options if your condition(s) worsen?: Yes-patient verbalized understanding  SDOH  Interventions Today    Flowsheet Row Most Recent Value  SDOH Interventions   Food Insecurity Interventions Intervention Not Indicated  Housing Interventions Intervention Not Indicated  Transportation Interventions Intervention Not Indicated      Interventions Today    Flowsheet Row Most Recent Value  General Interventions   General Interventions Discussed/Reviewed General Interventions Discussed, General Interventions Reviewed, Doctor Visits  Doctor Visits Discussed/Reviewed Doctor Visits Discussed, Doctor Visits Reviewed, PCP  PCP/Specialist Visits Compliance with follow-up visit  [place] Discussed, [facility]  [Patient husband has called and set up follow up [place] Management [phone]

## 2022-12-27 NOTE — Chronic Care Management (AMB) (Signed)
   12/27/2022  Cynthia [name] Apr 15, 1956 [id number]   Reason for Encounter: Patient is not currently enrolled in the CCM program. CCM status changed to previously enrolled.   Horris Latino RN Care Manager/Chronic Care Management [phone]

## 2022-12-27 NOTE — Transitions of Care (Post Inpatient/ED Visit) (Signed)
   12/27/2022  Name: Cynthia [name] MRN: [id number] DOB: Nov 14, 1955  Today's TOC FU Call Status: Today's TOC FU Call Status:: Unsuccessul Call (1st Attempt) Unsuccessful Call (1st Attempt) Date: 12/27/22  Attempted to reach the patient regarding the most recent Inpatient/ED visit.  Follow Up Plan: Additional outreach attempts will be made to reach the patient to complete the Transitions of Care (Post Inpatient/ED visit) call.   [facility] [phone]

## 2022-12-28 ENCOUNTER — Other Ambulatory Visit: Payer: Self-pay | Admitting: Internal Medicine

## 2022-12-28 DIAGNOSIS — G8929 Other chronic pain: Secondary | ICD-10-CM | POA: Diagnosis not present

## 2022-12-28 DIAGNOSIS — S069X9S Unspecified intracranial injury with loss of consciousness of unspecified duration, sequela: Secondary | ICD-10-CM | POA: Diagnosis not present

## 2022-12-28 DIAGNOSIS — Z950 Presence of cardiac pacemaker: Secondary | ICD-10-CM | POA: Diagnosis not present

## 2022-12-28 DIAGNOSIS — I10 Essential (primary) hypertension: Secondary | ICD-10-CM

## 2022-12-28 DIAGNOSIS — Z87891 Personal history of nicotine dependence: Secondary | ICD-10-CM | POA: Diagnosis not present

## 2022-12-28 DIAGNOSIS — M25559 Pain in unspecified hip: Secondary | ICD-10-CM | POA: Diagnosis not present

## 2022-12-28 DIAGNOSIS — J4550 Severe persistent asthma, uncomplicated: Secondary | ICD-10-CM | POA: Diagnosis not present

## 2022-12-28 DIAGNOSIS — Z7982 Long term (current) use of aspirin: Secondary | ICD-10-CM | POA: Diagnosis not present

## 2022-12-28 DIAGNOSIS — G629 Polyneuropathy, unspecified: Secondary | ICD-10-CM | POA: Diagnosis not present

## 2022-12-28 DIAGNOSIS — H9192 Unspecified hearing loss, left ear: Secondary | ICD-10-CM | POA: Diagnosis not present

## 2022-12-28 DIAGNOSIS — I739 Peripheral vascular disease, unspecified: Secondary | ICD-10-CM | POA: Diagnosis not present

## 2022-12-28 DIAGNOSIS — E43 Unspecified severe protein-calorie malnutrition: Secondary | ICD-10-CM | POA: Diagnosis not present

## 2022-12-28 DIAGNOSIS — J4489 Other specified chronic obstructive pulmonary disease: Secondary | ICD-10-CM | POA: Diagnosis not present

## 2022-12-28 DIAGNOSIS — Z7951 Long term (current) use of inhaled steroids: Secondary | ICD-10-CM | POA: Diagnosis not present

## 2022-12-28 DIAGNOSIS — I480 Paroxysmal atrial fibrillation: Secondary | ICD-10-CM | POA: Diagnosis not present

## 2022-12-28 DIAGNOSIS — R29898 Other symptoms and signs involving the musculoskeletal system: Secondary | ICD-10-CM | POA: Diagnosis not present

## 2022-12-28 DIAGNOSIS — I2510 Atherosclerotic heart disease of native coronary artery without angina pectoris: Secondary | ICD-10-CM | POA: Diagnosis not present

## 2022-12-28 DIAGNOSIS — E785 Hyperlipidemia, unspecified: Secondary | ICD-10-CM | POA: Diagnosis not present

## 2022-12-28 DIAGNOSIS — Z7902 Long term (current) use of antithrombotics/antiplatelets: Secondary | ICD-10-CM | POA: Diagnosis not present

## 2022-12-28 NOTE — Telephone Encounter (Signed)
Requested Prescriptions  Pending Prescriptions Disp Refills   amLODipine (NORVASC) 10 MG tablet [Pharmacy Med Name: AMLODIPINE BESYLATE 10 MG TAB] 90 tablet 0    Sig: TAKE 1 TABLET BY MOUTH ONCE DAILY     Cardiovascular: Calcium Channel Blockers 2 Failed - 12/28/2022 11:13 AM      Failed - Last BP in normal range    BP Readings from Last 1 Encounters:  12/26/22 (!) 86/41         Passed - Last Heart Rate in normal range    Pulse Readings from Last 1 Encounters:  12/26/22 (!) 55         Passed - Valid encounter within last 6 months    Recent Outpatient Visits           3 weeks ago Essential hypertension, benign   [facility] Delsa Grana, PA-C   3 months ago [facility], FNP   3 months ago Essential hypertension, benign   [facility] Delsa Grana, PA-C   3 months ago Urinary frequency   [facility] Delsa Grana, PA-C   9 months ago Encounter for screening mammogram for malignant neoplasm of breast   [facility] Myles Gip, DO       Future Appointments             In 1 week Delsa Grana, [facility], [place]   In 2 months Delsa Grana, [facility], [place]   In 6 months  [place]

## 2022-12-29 ENCOUNTER — Telehealth: Payer: Self-pay

## 2022-12-29 NOTE — Telephone Encounter (Signed)
Copied from [place] [phone]. Topic: General - Other >> Dec 28, 2022  4:13 PM Ludger Nutting wrote: Home Health Verbal Orders - Caller/Agency: [place] Number: [phone] Requesting OT/PT/Skilled Nursing/Social Work/Speech Therapy: OT Frequency: 1w5

## 2022-12-29 NOTE — Telephone Encounter (Signed)
VO given.

## 2022-12-30 ENCOUNTER — Telehealth: Payer: Self-pay | Admitting: Family Medicine

## 2022-12-30 NOTE — Telephone Encounter (Signed)
Copied from CRM [id number]. Topic: Quick Communication - Home Health Verbal Orders >> Dec 30, 2022  1:45 PM Everette C wrote: Caller/Agency: Tressie Stalker Callback Number: [phone] Requesting OT/PT/Skilled Nursing/Social Work/Speech Therapy: PT  Has called to notify the practice of a delay in care and the patient's family is requesting start of care 01/02/23

## 2022-12-30 NOTE — Telephone Encounter (Signed)
FYI

## 2023-01-02 DIAGNOSIS — E43 Unspecified severe protein-calorie malnutrition: Secondary | ICD-10-CM | POA: Diagnosis not present

## 2023-01-02 DIAGNOSIS — E785 Hyperlipidemia, unspecified: Secondary | ICD-10-CM | POA: Diagnosis not present

## 2023-01-02 DIAGNOSIS — R29898 Other symptoms and signs involving the musculoskeletal system: Secondary | ICD-10-CM | POA: Diagnosis not present

## 2023-01-02 DIAGNOSIS — G629 Polyneuropathy, unspecified: Secondary | ICD-10-CM | POA: Diagnosis not present

## 2023-01-02 DIAGNOSIS — Z7951 Long term (current) use of inhaled steroids: Secondary | ICD-10-CM | POA: Diagnosis not present

## 2023-01-02 DIAGNOSIS — I480 Paroxysmal atrial fibrillation: Secondary | ICD-10-CM | POA: Diagnosis not present

## 2023-01-02 DIAGNOSIS — Z87891 Personal history of nicotine dependence: Secondary | ICD-10-CM | POA: Diagnosis not present

## 2023-01-02 DIAGNOSIS — Z7982 Long term (current) use of aspirin: Secondary | ICD-10-CM | POA: Diagnosis not present

## 2023-01-02 DIAGNOSIS — J4489 Other specified chronic obstructive pulmonary disease: Secondary | ICD-10-CM | POA: Diagnosis not present

## 2023-01-02 DIAGNOSIS — M25559 Pain in unspecified hip: Secondary | ICD-10-CM | POA: Diagnosis not present

## 2023-01-02 DIAGNOSIS — I739 Peripheral vascular disease, unspecified: Secondary | ICD-10-CM | POA: Diagnosis not present

## 2023-01-02 DIAGNOSIS — G8929 Other chronic pain: Secondary | ICD-10-CM | POA: Diagnosis not present

## 2023-01-02 DIAGNOSIS — S069X9S Unspecified intracranial injury with loss of consciousness of unspecified duration, sequela: Secondary | ICD-10-CM | POA: Diagnosis not present

## 2023-01-02 DIAGNOSIS — I10 Essential (primary) hypertension: Secondary | ICD-10-CM | POA: Diagnosis not present

## 2023-01-02 DIAGNOSIS — J4550 Severe persistent asthma, uncomplicated: Secondary | ICD-10-CM | POA: Diagnosis not present

## 2023-01-02 DIAGNOSIS — I2510 Atherosclerotic heart disease of native coronary artery without angina pectoris: Secondary | ICD-10-CM | POA: Diagnosis not present

## 2023-01-02 DIAGNOSIS — Z950 Presence of cardiac pacemaker: Secondary | ICD-10-CM | POA: Diagnosis not present

## 2023-01-02 DIAGNOSIS — Z7902 Long term (current) use of antithrombotics/antiplatelets: Secondary | ICD-10-CM | POA: Diagnosis not present

## 2023-01-02 DIAGNOSIS — H9192 Unspecified hearing loss, left ear: Secondary | ICD-10-CM | POA: Diagnosis not present

## 2023-01-03 ENCOUNTER — Other Ambulatory Visit (INDEPENDENT_AMBULATORY_CARE_PROVIDER_SITE_OTHER): Payer: Self-pay | Admitting: Nurse Practitioner

## 2023-01-03 DIAGNOSIS — I70223 Atherosclerosis of native arteries of extremities with rest pain, bilateral legs: Secondary | ICD-10-CM
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
## 2023-01-04 ENCOUNTER — Encounter: Payer: Self-pay | Admitting: Family Medicine

## 2023-01-04 ENCOUNTER — Ambulatory Visit (INDEPENDENT_AMBULATORY_CARE_PROVIDER_SITE_OTHER): Payer: 59 | Admitting: Family Medicine

## 2023-01-04 VITALS — BP 134/72 | HR 87 | Temp 97.6°F | Resp 16 | Ht 65.0 in | Wt 125.0 lb

## 2023-01-04 DIAGNOSIS — R0600 Dyspnea, unspecified: Secondary | ICD-10-CM | POA: Diagnosis not present

## 2023-01-04 DIAGNOSIS — I10 Essential (primary) hypertension: Secondary | ICD-10-CM | POA: Diagnosis not present

## 2023-01-04 DIAGNOSIS — Z09 Encounter for follow-up examination after completed treatment for conditions other than malignant neoplasm: Secondary | ICD-10-CM | POA: Diagnosis not present

## 2023-01-04 DIAGNOSIS — J4489 Other specified chronic obstructive pulmonary disease: Secondary | ICD-10-CM | POA: Diagnosis not present

## 2023-01-04 DIAGNOSIS — Z87891 Personal history of nicotine dependence: Secondary | ICD-10-CM | POA: Diagnosis not present

## 2023-01-04 DIAGNOSIS — G8929 Other chronic pain: Secondary | ICD-10-CM | POA: Diagnosis not present

## 2023-01-04 DIAGNOSIS — M25559 Pain in unspecified hip: Secondary | ICD-10-CM | POA: Diagnosis not present

## 2023-01-04 DIAGNOSIS — E440 Moderate protein-calorie malnutrition: Secondary | ICD-10-CM

## 2023-01-04 DIAGNOSIS — T56891A Toxic effect of other metals, accidental (unintentional), initial encounter: Secondary | ICD-10-CM | POA: Diagnosis not present

## 2023-01-04 DIAGNOSIS — Z7902 Long term (current) use of antithrombotics/antiplatelets: Secondary | ICD-10-CM | POA: Diagnosis not present

## 2023-01-04 DIAGNOSIS — I480 Paroxysmal atrial fibrillation: Secondary | ICD-10-CM | POA: Diagnosis not present

## 2023-01-04 DIAGNOSIS — Z9181 History of falling: Secondary | ICD-10-CM | POA: Diagnosis not present

## 2023-01-04 DIAGNOSIS — I2510 Atherosclerotic heart disease of native coronary artery without angina pectoris: Secondary | ICD-10-CM | POA: Diagnosis not present

## 2023-01-04 DIAGNOSIS — G928 Other toxic encephalopathy: Secondary | ICD-10-CM

## 2023-01-04 DIAGNOSIS — E43 Unspecified severe protein-calorie malnutrition: Secondary | ICD-10-CM | POA: Diagnosis not present

## 2023-01-04 DIAGNOSIS — R413 Other amnesia: Secondary | ICD-10-CM | POA: Diagnosis not present

## 2023-01-04 DIAGNOSIS — S069X9S Unspecified intracranial injury with loss of consciousness of unspecified duration, sequela: Secondary | ICD-10-CM | POA: Diagnosis not present

## 2023-01-04 DIAGNOSIS — E785 Hyperlipidemia, unspecified: Secondary | ICD-10-CM | POA: Diagnosis not present

## 2023-01-04 DIAGNOSIS — G629 Polyneuropathy, unspecified: Secondary | ICD-10-CM | POA: Diagnosis not present

## 2023-01-04 DIAGNOSIS — J441 Chronic obstructive pulmonary disease with (acute) exacerbation: Secondary | ICD-10-CM | POA: Diagnosis not present

## 2023-01-04 DIAGNOSIS — Z950 Presence of cardiac pacemaker: Secondary | ICD-10-CM | POA: Diagnosis not present

## 2023-01-04 DIAGNOSIS — G40909 Epilepsy, unspecified, not intractable, without status epilepticus: Secondary | ICD-10-CM

## 2023-01-04 DIAGNOSIS — F3160 Bipolar disorder, current episode mixed, unspecified: Secondary | ICD-10-CM

## 2023-01-04 DIAGNOSIS — J690 Pneumonitis due to inhalation of food and vomit: Secondary | ICD-10-CM

## 2023-01-04 DIAGNOSIS — I739 Peripheral vascular disease, unspecified: Secondary | ICD-10-CM | POA: Diagnosis not present

## 2023-01-04 DIAGNOSIS — Z7982 Long term (current) use of aspirin: Secondary | ICD-10-CM | POA: Diagnosis not present

## 2023-01-04 DIAGNOSIS — Z7951 Long term (current) use of inhaled steroids: Secondary | ICD-10-CM | POA: Diagnosis not present

## 2023-01-04 DIAGNOSIS — Z789 Other specified health status: Secondary | ICD-10-CM

## 2023-01-04 DIAGNOSIS — J4550 Severe persistent asthma, uncomplicated: Secondary | ICD-10-CM | POA: Diagnosis not present

## 2023-01-04 DIAGNOSIS — H9192 Unspecified hearing loss, left ear: Secondary | ICD-10-CM | POA: Diagnosis not present

## 2023-01-04 DIAGNOSIS — R29898 Other symptoms and signs involving the musculoskeletal system: Secondary | ICD-10-CM | POA: Diagnosis not present

## 2023-01-04 MED ORDER — PREDNISONE 20 MG PO TABS: 40.0000 mg | ORAL_TABLET | Freq: Every day | ORAL | 0 refills | Status: DC

## 2023-01-04 MED ORDER — IPRATROPIUM-ALBUTEROL 0.5-2.5 (3) MG/3ML IN SOLN: 3.0000 mL | Freq: Three times a day (TID) | RESPIRATORY_TRACT | 1 refills | Status: DC | PRN

## 2023-01-04 NOTE — Assessment & Plan Note (Signed)
Off lithium now - per psych and neurology

## 2023-01-04 NOTE — Patient Instructions (Signed)
Keep follow up appointment with Raynelle Fanning at [facility] at 01/20/2023 at 9:40

## 2023-01-04 NOTE — Assessment & Plan Note (Signed)
Gait/mobility better than last OV, continue to be vigilant about avoiding falls, doing [facility] PT/OT

## 2023-01-04 NOTE — Assessment & Plan Note (Signed)
Close f/up in office for montioring weight and adding consults or Rx for nutritional supplements as needed Mood down and multiple med changes that affect gut/GI and appetite Expect things to settle down in a few weeks Today weight is improved from the day of discharge Wt Readings from Last 5 Encounters:  01/04/23 125 lb (56.7 kg)  12/26/22 103 lb 9.9 oz (47 kg)  12/06/22 120 lb 12.8 oz (54.8 kg)  09/20/22 121 lb 1.6 oz (54.9 kg)  09/06/22 126 lb 8 oz (57.4 kg)   BMI Readings from Last 5 Encounters:  01/04/23 20.80 kg/m  12/26/22 17.24 kg/m  12/06/22 20.10 kg/m  09/20/22 20.15 kg/m  09/06/22 21.05 kg/m

## 2023-01-04 NOTE — Assessment & Plan Note (Signed)
Pt reports feeling ill with worse breathing, cough, productive, SOB She did have aspiration pneumonia inpt treated with zosyn and last CXR reviewed She has rhonchi and wheeze with congestion noted to left mid to upper lung fields with dimished BS b/l at bases Will do CXR outpt, start steroid burst tomorrow am and start nebs at home on top of normal maintenance inhaler She is generally fatigued, worse dypnea and coughing, possible chills but she and her husband do not believe she has had fever, night sweats and she denies pleuritic CP

## 2023-01-04 NOTE — Assessment & Plan Note (Signed)
they have f/up with neurology - not sure if additional seizure meds will need to be added No seizures inpt and none since discharge Her tremor and gait continue to be improved with d/c of multiple meds from psych

## 2023-01-04 NOTE — Assessment & Plan Note (Signed)
She continues to be back at baseline mental status with chronic memory loss Any therapeutic drug dosing or monitoring will need to be done carefully by prescribing specialists

## 2023-01-04 NOTE — Assessment & Plan Note (Signed)
baseline

## 2023-01-04 NOTE — Assessment & Plan Note (Signed)
BP was elevated while inpt, renal function declined when they added HCTZ inpt Today on norvasc 10 and losartan 100 her BP is near goal No med changes Continue norvasc 10 and losartan 100 mg daily BP Readings from Last 3 Encounters:  01/04/23 134/72  12/26/22 (!) 86/41  12/06/22 (!) 158/76   Recheck again in 1 month
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
## 2023-01-04 NOTE — Assessment & Plan Note (Signed)
Doing [facility] PT/OT, may need new order again to outpt PT when done with [facility]

## 2023-01-05 DIAGNOSIS — Z87891 Personal history of nicotine dependence: Secondary | ICD-10-CM | POA: Diagnosis not present

## 2023-01-05 DIAGNOSIS — I10 Essential (primary) hypertension: Secondary | ICD-10-CM | POA: Diagnosis not present

## 2023-01-05 DIAGNOSIS — J4489 Other specified chronic obstructive pulmonary disease: Secondary | ICD-10-CM | POA: Diagnosis not present

## 2023-01-05 DIAGNOSIS — Z7902 Long term (current) use of antithrombotics/antiplatelets: Secondary | ICD-10-CM | POA: Diagnosis not present

## 2023-01-05 DIAGNOSIS — E43 Unspecified severe protein-calorie malnutrition: Secondary | ICD-10-CM | POA: Diagnosis not present

## 2023-01-05 DIAGNOSIS — I2510 Atherosclerotic heart disease of native coronary artery without angina pectoris: Secondary | ICD-10-CM | POA: Diagnosis not present

## 2023-01-05 DIAGNOSIS — E785 Hyperlipidemia, unspecified: Secondary | ICD-10-CM | POA: Diagnosis not present

## 2023-01-05 DIAGNOSIS — G8929 Other chronic pain: Secondary | ICD-10-CM | POA: Diagnosis not present

## 2023-01-05 DIAGNOSIS — I739 Peripheral vascular disease, unspecified: Secondary | ICD-10-CM | POA: Diagnosis not present

## 2023-01-05 DIAGNOSIS — Z7982 Long term (current) use of aspirin: Secondary | ICD-10-CM | POA: Diagnosis not present

## 2023-01-05 DIAGNOSIS — M25559 Pain in unspecified hip: Secondary | ICD-10-CM | POA: Diagnosis not present

## 2023-01-05 DIAGNOSIS — Z950 Presence of cardiac pacemaker: Secondary | ICD-10-CM | POA: Diagnosis not present

## 2023-01-05 DIAGNOSIS — Z7951 Long term (current) use of inhaled steroids: Secondary | ICD-10-CM | POA: Diagnosis not present

## 2023-01-05 DIAGNOSIS — R29898 Other symptoms and signs involving the musculoskeletal system: Secondary | ICD-10-CM | POA: Diagnosis not present

## 2023-01-05 DIAGNOSIS — S069X9S Unspecified intracranial injury with loss of consciousness of unspecified duration, sequela: Secondary | ICD-10-CM | POA: Diagnosis not present

## 2023-01-05 DIAGNOSIS — G629 Polyneuropathy, unspecified: Secondary | ICD-10-CM | POA: Diagnosis not present

## 2023-01-05 DIAGNOSIS — H9192 Unspecified hearing loss, left ear: Secondary | ICD-10-CM | POA: Diagnosis not present

## 2023-01-05 DIAGNOSIS — J4550 Severe persistent asthma, uncomplicated: Secondary | ICD-10-CM | POA: Diagnosis not present

## 2023-01-05 DIAGNOSIS — I480 Paroxysmal atrial fibrillation: Secondary | ICD-10-CM | POA: Diagnosis not present

## 2023-01-06 ENCOUNTER — Ambulatory Visit
Admission: RE | Admit: 2023-01-06 | Discharge: 2023-01-06 | Disposition: A | Discharge status: Home / Self Care | Payer: 59 | Source: Ambulatory Visit | Attending: Family Medicine | Admitting: Family Medicine

## 2023-01-06 ENCOUNTER — Ambulatory Visit
Admission: RE | Admit: 2023-01-06 | Discharge: 2023-01-06 | Disposition: A | Discharge status: Home / Self Care | Payer: 59 | Attending: Family Medicine | Admitting: Family Medicine

## 2023-01-06 DIAGNOSIS — R0600 Dyspnea, unspecified: Secondary | ICD-10-CM

## 2023-01-06 DIAGNOSIS — J690 Pneumonitis due to inhalation of food and vomit: Secondary | ICD-10-CM

## 2023-01-06 DIAGNOSIS — J441 Chronic obstructive pulmonary disease with (acute) exacerbation: Secondary | ICD-10-CM | POA: Insufficient documentation

## 2023-01-06 DIAGNOSIS — J811 Chronic pulmonary edema: Secondary | ICD-10-CM | POA: Diagnosis not present

## 2023-01-06 DIAGNOSIS — R0602 Shortness of breath: Secondary | ICD-10-CM | POA: Diagnosis not present

## 2023-01-09 DIAGNOSIS — J4489 Other specified chronic obstructive pulmonary disease: Secondary | ICD-10-CM | POA: Diagnosis not present

## 2023-01-09 DIAGNOSIS — G8929 Other chronic pain: Secondary | ICD-10-CM | POA: Diagnosis not present

## 2023-01-09 DIAGNOSIS — R29898 Other symptoms and signs involving the musculoskeletal system: Secondary | ICD-10-CM | POA: Diagnosis not present

## 2023-01-09 DIAGNOSIS — I739 Peripheral vascular disease, unspecified: Secondary | ICD-10-CM | POA: Diagnosis not present

## 2023-01-09 DIAGNOSIS — I480 Paroxysmal atrial fibrillation: Secondary | ICD-10-CM | POA: Diagnosis not present

## 2023-01-09 DIAGNOSIS — E785 Hyperlipidemia, unspecified: Secondary | ICD-10-CM | POA: Diagnosis not present

## 2023-01-09 DIAGNOSIS — E43 Unspecified severe protein-calorie malnutrition: Secondary | ICD-10-CM | POA: Diagnosis not present

## 2023-01-09 DIAGNOSIS — J4550 Severe persistent asthma, uncomplicated: Secondary | ICD-10-CM | POA: Diagnosis not present

## 2023-01-09 DIAGNOSIS — G629 Polyneuropathy, unspecified: Secondary | ICD-10-CM | POA: Diagnosis not present

## 2023-01-09 DIAGNOSIS — M25559 Pain in unspecified hip: Secondary | ICD-10-CM | POA: Diagnosis not present

## 2023-01-09 DIAGNOSIS — Z7951 Long term (current) use of inhaled steroids: Secondary | ICD-10-CM | POA: Diagnosis not present

## 2023-01-09 DIAGNOSIS — I10 Essential (primary) hypertension: Secondary | ICD-10-CM | POA: Diagnosis not present

## 2023-01-09 DIAGNOSIS — I2510 Atherosclerotic heart disease of native coronary artery without angina pectoris: Secondary | ICD-10-CM | POA: Diagnosis not present

## 2023-01-09 DIAGNOSIS — S069X9S Unspecified intracranial injury with loss of consciousness of unspecified duration, sequela: Secondary | ICD-10-CM | POA: Diagnosis not present

## 2023-01-09 DIAGNOSIS — Z950 Presence of cardiac pacemaker: Secondary | ICD-10-CM | POA: Diagnosis not present

## 2023-01-09 DIAGNOSIS — Z7902 Long term (current) use of antithrombotics/antiplatelets: Secondary | ICD-10-CM | POA: Diagnosis not present

## 2023-01-09 DIAGNOSIS — H9192 Unspecified hearing loss, left ear: Secondary | ICD-10-CM | POA: Diagnosis not present

## 2023-01-09 DIAGNOSIS — Z87891 Personal history of nicotine dependence: Secondary | ICD-10-CM | POA: Diagnosis not present

## 2023-01-09 DIAGNOSIS — Z7982 Long term (current) use of aspirin: Secondary | ICD-10-CM | POA: Diagnosis not present

## 2023-01-11 DIAGNOSIS — M25559 Pain in unspecified hip: Secondary | ICD-10-CM | POA: Diagnosis not present

## 2023-01-11 DIAGNOSIS — Z7982 Long term (current) use of aspirin: Secondary | ICD-10-CM | POA: Diagnosis not present

## 2023-01-11 DIAGNOSIS — E785 Hyperlipidemia, unspecified: Secondary | ICD-10-CM | POA: Diagnosis not present

## 2023-01-11 DIAGNOSIS — I480 Paroxysmal atrial fibrillation: Secondary | ICD-10-CM | POA: Diagnosis not present

## 2023-01-11 DIAGNOSIS — Z87891 Personal history of nicotine dependence: Secondary | ICD-10-CM | POA: Diagnosis not present

## 2023-01-11 DIAGNOSIS — Z7902 Long term (current) use of antithrombotics/antiplatelets: Secondary | ICD-10-CM | POA: Diagnosis not present

## 2023-01-11 DIAGNOSIS — I10 Essential (primary) hypertension: Secondary | ICD-10-CM | POA: Diagnosis not present

## 2023-01-11 DIAGNOSIS — G629 Polyneuropathy, unspecified: Secondary | ICD-10-CM | POA: Diagnosis not present

## 2023-01-11 DIAGNOSIS — S069X9S Unspecified intracranial injury with loss of consciousness of unspecified duration, sequela: Secondary | ICD-10-CM | POA: Diagnosis not present

## 2023-01-11 DIAGNOSIS — E43 Unspecified severe protein-calorie malnutrition: Secondary | ICD-10-CM | POA: Diagnosis not present

## 2023-01-11 DIAGNOSIS — J4550 Severe persistent asthma, uncomplicated: Secondary | ICD-10-CM | POA: Diagnosis not present

## 2023-01-11 DIAGNOSIS — Z7951 Long term (current) use of inhaled steroids: Secondary | ICD-10-CM | POA: Diagnosis not present

## 2023-01-11 DIAGNOSIS — H9192 Unspecified hearing loss, left ear: Secondary | ICD-10-CM | POA: Diagnosis not present

## 2023-01-11 DIAGNOSIS — I2510 Atherosclerotic heart disease of native coronary artery without angina pectoris: Secondary | ICD-10-CM | POA: Diagnosis not present

## 2023-01-11 DIAGNOSIS — I739 Peripheral vascular disease, unspecified: Secondary | ICD-10-CM | POA: Diagnosis not present

## 2023-01-11 DIAGNOSIS — R29898 Other symptoms and signs involving the musculoskeletal system: Secondary | ICD-10-CM | POA: Diagnosis not present

## 2023-01-11 DIAGNOSIS — Z950 Presence of cardiac pacemaker: Secondary | ICD-10-CM | POA: Diagnosis not present

## 2023-01-11 DIAGNOSIS — J4489 Other specified chronic obstructive pulmonary disease: Secondary | ICD-10-CM | POA: Diagnosis not present

## 2023-01-11 DIAGNOSIS — G8929 Other chronic pain: Secondary | ICD-10-CM | POA: Diagnosis not present
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
## 2023-01-12 ENCOUNTER — Ambulatory Visit (INDEPENDENT_AMBULATORY_CARE_PROVIDER_SITE_OTHER): Payer: 59 | Admitting: Vascular Surgery

## 2023-01-12 ENCOUNTER — Encounter (INDEPENDENT_AMBULATORY_CARE_PROVIDER_SITE_OTHER): Payer: Medicare Other

## 2023-01-12 DIAGNOSIS — J4550 Severe persistent asthma, uncomplicated: Secondary | ICD-10-CM | POA: Diagnosis not present

## 2023-01-12 DIAGNOSIS — E785 Hyperlipidemia, unspecified: Secondary | ICD-10-CM | POA: Diagnosis not present

## 2023-01-12 DIAGNOSIS — Z87891 Personal history of nicotine dependence: Secondary | ICD-10-CM | POA: Diagnosis not present

## 2023-01-12 DIAGNOSIS — E43 Unspecified severe protein-calorie malnutrition: Secondary | ICD-10-CM | POA: Diagnosis not present

## 2023-01-12 DIAGNOSIS — I480 Paroxysmal atrial fibrillation: Secondary | ICD-10-CM

## 2023-01-12 DIAGNOSIS — I10 Essential (primary) hypertension: Secondary | ICD-10-CM | POA: Diagnosis not present

## 2023-01-12 DIAGNOSIS — I739 Peripheral vascular disease, unspecified: Secondary | ICD-10-CM

## 2023-01-12 DIAGNOSIS — I25119 Atherosclerotic heart disease of native coronary artery with unspecified angina pectoris: Secondary | ICD-10-CM

## 2023-01-12 DIAGNOSIS — S069X9S Unspecified intracranial injury with loss of consciousness of unspecified duration, sequela: Secondary | ICD-10-CM | POA: Diagnosis not present

## 2023-01-12 DIAGNOSIS — G8929 Other chronic pain: Secondary | ICD-10-CM | POA: Diagnosis not present

## 2023-01-12 DIAGNOSIS — I2510 Atherosclerotic heart disease of native coronary artery without angina pectoris: Secondary | ICD-10-CM | POA: Diagnosis not present

## 2023-01-12 DIAGNOSIS — G629 Polyneuropathy, unspecified: Secondary | ICD-10-CM | POA: Diagnosis not present

## 2023-01-12 DIAGNOSIS — H9192 Unspecified hearing loss, left ear: Secondary | ICD-10-CM | POA: Diagnosis not present

## 2023-01-12 DIAGNOSIS — Z7902 Long term (current) use of antithrombotics/antiplatelets: Secondary | ICD-10-CM | POA: Diagnosis not present

## 2023-01-12 DIAGNOSIS — Z7982 Long term (current) use of aspirin: Secondary | ICD-10-CM | POA: Diagnosis not present

## 2023-01-12 DIAGNOSIS — E782 Mixed hyperlipidemia: Secondary | ICD-10-CM

## 2023-01-12 DIAGNOSIS — R29898 Other symptoms and signs involving the musculoskeletal system: Secondary | ICD-10-CM | POA: Diagnosis not present

## 2023-01-12 DIAGNOSIS — M25559 Pain in unspecified hip: Secondary | ICD-10-CM | POA: Diagnosis not present

## 2023-01-12 DIAGNOSIS — J4489 Other specified chronic obstructive pulmonary disease: Secondary | ICD-10-CM | POA: Diagnosis not present

## 2023-01-12 DIAGNOSIS — Z7951 Long term (current) use of inhaled steroids: Secondary | ICD-10-CM | POA: Diagnosis not present

## 2023-01-12 DIAGNOSIS — Z950 Presence of cardiac pacemaker: Secondary | ICD-10-CM | POA: Diagnosis not present

## 2023-01-16 ENCOUNTER — Ambulatory Visit: Payer: 59

## 2023-01-16 DIAGNOSIS — M25559 Pain in unspecified hip: Secondary | ICD-10-CM | POA: Diagnosis not present

## 2023-01-16 DIAGNOSIS — E43 Unspecified severe protein-calorie malnutrition: Secondary | ICD-10-CM | POA: Diagnosis not present

## 2023-01-16 DIAGNOSIS — J4550 Severe persistent asthma, uncomplicated: Secondary | ICD-10-CM | POA: Diagnosis not present

## 2023-01-16 DIAGNOSIS — E785 Hyperlipidemia, unspecified: Secondary | ICD-10-CM | POA: Diagnosis not present

## 2023-01-16 DIAGNOSIS — I10 Essential (primary) hypertension: Secondary | ICD-10-CM | POA: Diagnosis not present

## 2023-01-16 DIAGNOSIS — Z7982 Long term (current) use of aspirin: Secondary | ICD-10-CM | POA: Diagnosis not present

## 2023-01-16 DIAGNOSIS — Z950 Presence of cardiac pacemaker: Secondary | ICD-10-CM | POA: Diagnosis not present

## 2023-01-16 DIAGNOSIS — R29898 Other symptoms and signs involving the musculoskeletal system: Secondary | ICD-10-CM | POA: Diagnosis not present

## 2023-01-16 DIAGNOSIS — S069X9S Unspecified intracranial injury with loss of consciousness of unspecified duration, sequela: Secondary | ICD-10-CM | POA: Diagnosis not present

## 2023-01-16 DIAGNOSIS — H9192 Unspecified hearing loss, left ear: Secondary | ICD-10-CM | POA: Diagnosis not present

## 2023-01-16 DIAGNOSIS — Z7951 Long term (current) use of inhaled steroids: Secondary | ICD-10-CM | POA: Diagnosis not present

## 2023-01-16 DIAGNOSIS — I2510 Atherosclerotic heart disease of native coronary artery without angina pectoris: Secondary | ICD-10-CM | POA: Diagnosis not present

## 2023-01-16 DIAGNOSIS — J4489 Other specified chronic obstructive pulmonary disease: Secondary | ICD-10-CM | POA: Diagnosis not present

## 2023-01-16 DIAGNOSIS — Z87891 Personal history of nicotine dependence: Secondary | ICD-10-CM | POA: Diagnosis not present

## 2023-01-16 DIAGNOSIS — Z7902 Long term (current) use of antithrombotics/antiplatelets: Secondary | ICD-10-CM | POA: Diagnosis not present

## 2023-01-16 DIAGNOSIS — G8929 Other chronic pain: Secondary | ICD-10-CM | POA: Diagnosis not present

## 2023-01-16 DIAGNOSIS — I480 Paroxysmal atrial fibrillation: Secondary | ICD-10-CM | POA: Diagnosis not present

## 2023-01-16 DIAGNOSIS — G629 Polyneuropathy, unspecified: Secondary | ICD-10-CM | POA: Diagnosis not present

## 2023-01-16 DIAGNOSIS — I739 Peripheral vascular disease, unspecified: Secondary | ICD-10-CM | POA: Diagnosis not present

## 2023-01-21 DIAGNOSIS — I2510 Atherosclerotic heart disease of native coronary artery without angina pectoris: Secondary | ICD-10-CM | POA: Diagnosis not present

## 2023-01-21 DIAGNOSIS — J9690 Respiratory failure, unspecified, unspecified whether with hypoxia or hypercapnia: Secondary | ICD-10-CM | POA: Diagnosis not present

## 2023-01-21 DIAGNOSIS — Z955 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: Secondary | ICD-10-CM | POA: Diagnosis not present

## 2023-01-21 DIAGNOSIS — J439 Emphysema, unspecified: Secondary | ICD-10-CM | POA: Diagnosis not present

## 2023-01-21 DIAGNOSIS — I495 Sick sinus syndrome: Secondary | ICD-10-CM | POA: Diagnosis not present

## 2023-01-21 DIAGNOSIS — Z8673 Personal history of transient ischemic attack (TIA), and cerebral infarction without residual deficits: Secondary | ICD-10-CM | POA: Diagnosis not present

## 2023-01-21 DIAGNOSIS — I739 Peripheral vascular disease, unspecified: Secondary | ICD-10-CM | POA: Diagnosis not present

## 2023-01-21 DIAGNOSIS — F17218 Nicotine dependence, cigarettes, with other nicotine-induced disorders: Secondary | ICD-10-CM | POA: Diagnosis not present

## 2023-01-21 DIAGNOSIS — I38 Endocarditis, valve unspecified: Secondary | ICD-10-CM | POA: Diagnosis not present

## 2023-01-21 DIAGNOSIS — Z950 Presence of cardiac pacemaker: Secondary | ICD-10-CM | POA: Diagnosis not present

## 2023-01-21 DIAGNOSIS — E782 Mixed hyperlipidemia: Secondary | ICD-10-CM | POA: Diagnosis not present

## 2023-01-21 DIAGNOSIS — Z720 Tobacco use: Secondary | ICD-10-CM | POA: Diagnosis not present

## 2023-01-23 DIAGNOSIS — R29898 Other symptoms and signs involving the musculoskeletal system: Secondary | ICD-10-CM | POA: Diagnosis not present

## 2023-01-23 DIAGNOSIS — Z7982 Long term (current) use of aspirin: Secondary | ICD-10-CM | POA: Diagnosis not present

## 2023-01-23 DIAGNOSIS — J4489 Other specified chronic obstructive pulmonary disease: Secondary | ICD-10-CM | POA: Diagnosis not present

## 2023-01-23 DIAGNOSIS — G629 Polyneuropathy, unspecified: Secondary | ICD-10-CM | POA: Diagnosis not present

## 2023-01-23 DIAGNOSIS — E43 Unspecified severe protein-calorie malnutrition: Secondary | ICD-10-CM | POA: Diagnosis not present

## 2023-01-23 DIAGNOSIS — M25559 Pain in unspecified hip: Secondary | ICD-10-CM | POA: Diagnosis not present

## 2023-01-23 DIAGNOSIS — Z87891 Personal history of nicotine dependence: Secondary | ICD-10-CM | POA: Diagnosis not present

## 2023-01-23 DIAGNOSIS — Z7902 Long term (current) use of antithrombotics/antiplatelets: Secondary | ICD-10-CM | POA: Diagnosis not present

## 2023-01-23 DIAGNOSIS — H9192 Unspecified hearing loss, left ear: Secondary | ICD-10-CM | POA: Diagnosis not present

## 2023-01-23 DIAGNOSIS — Z7951 Long term (current) use of inhaled steroids: Secondary | ICD-10-CM | POA: Diagnosis not present

## 2023-01-23 DIAGNOSIS — G8929 Other chronic pain: Secondary | ICD-10-CM | POA: Diagnosis not present

## 2023-01-23 DIAGNOSIS — E785 Hyperlipidemia, unspecified: Secondary | ICD-10-CM | POA: Diagnosis not present

## 2023-01-23 DIAGNOSIS — S069X9S Unspecified intracranial injury with loss of consciousness of unspecified duration, sequela: Secondary | ICD-10-CM | POA: Diagnosis not present

## 2023-01-23 DIAGNOSIS — I2510 Atherosclerotic heart disease of native coronary artery without angina pectoris: Secondary | ICD-10-CM | POA: Diagnosis not present

## 2023-01-23 DIAGNOSIS — I480 Paroxysmal atrial fibrillation: Secondary | ICD-10-CM | POA: Diagnosis not present

## 2023-01-23 DIAGNOSIS — Z950 Presence of cardiac pacemaker: Secondary | ICD-10-CM | POA: Diagnosis not present

## 2023-01-23 DIAGNOSIS — I739 Peripheral vascular disease, unspecified: Secondary | ICD-10-CM | POA: Diagnosis not present

## 2023-01-23 DIAGNOSIS — J4550 Severe persistent asthma, uncomplicated: Secondary | ICD-10-CM | POA: Diagnosis not present

## 2023-01-23 DIAGNOSIS — I10 Essential (primary) hypertension: Secondary | ICD-10-CM | POA: Diagnosis not present

## 2023-01-30 DIAGNOSIS — I2510 Atherosclerotic heart disease of native coronary artery without angina pectoris: Secondary | ICD-10-CM | POA: Diagnosis not present

## 2023-01-30 DIAGNOSIS — G629 Polyneuropathy, unspecified: Secondary | ICD-10-CM | POA: Diagnosis not present

## 2023-01-30 DIAGNOSIS — H9192 Unspecified hearing loss, left ear: Secondary | ICD-10-CM | POA: Diagnosis not present

## 2023-01-30 DIAGNOSIS — J4550 Severe persistent asthma, uncomplicated: Secondary | ICD-10-CM | POA: Diagnosis not present

## 2023-01-30 DIAGNOSIS — G8929 Other chronic pain: Secondary | ICD-10-CM | POA: Diagnosis not present

## 2023-01-30 DIAGNOSIS — M25559 Pain in unspecified hip: Secondary | ICD-10-CM | POA: Diagnosis not present

## 2023-01-30 DIAGNOSIS — E43 Unspecified severe protein-calorie malnutrition: Secondary | ICD-10-CM | POA: Diagnosis not present

## 2023-01-30 DIAGNOSIS — E785 Hyperlipidemia, unspecified: Secondary | ICD-10-CM | POA: Diagnosis not present

## 2023-01-30 DIAGNOSIS — R29898 Other symptoms and signs involving the musculoskeletal system: Secondary | ICD-10-CM | POA: Diagnosis not present

## 2023-01-30 DIAGNOSIS — I739 Peripheral vascular disease, unspecified: Secondary | ICD-10-CM | POA: Diagnosis not present

## 2023-01-30 DIAGNOSIS — Z7982 Long term (current) use of aspirin: Secondary | ICD-10-CM | POA: Diagnosis not present

## 2023-01-30 DIAGNOSIS — I480 Paroxysmal atrial fibrillation: Secondary | ICD-10-CM | POA: Diagnosis not present

## 2023-01-30 DIAGNOSIS — Z87891 Personal history of nicotine dependence: Secondary | ICD-10-CM | POA: Diagnosis not present

## 2023-01-30 DIAGNOSIS — I10 Essential (primary) hypertension: Secondary | ICD-10-CM | POA: Diagnosis not present

## 2023-01-30 DIAGNOSIS — J4489 Other specified chronic obstructive pulmonary disease: Secondary | ICD-10-CM | POA: Diagnosis not present

## 2023-01-30 DIAGNOSIS — Z7902 Long term (current) use of antithrombotics/antiplatelets: Secondary | ICD-10-CM | POA: Diagnosis not present

## 2023-01-30 DIAGNOSIS — Z7951 Long term (current) use of inhaled steroids: Secondary | ICD-10-CM | POA: Diagnosis not present

## 2023-01-30 DIAGNOSIS — Z950 Presence of cardiac pacemaker: Secondary | ICD-10-CM | POA: Diagnosis not present

## 2023-01-30 DIAGNOSIS — S069X9S Unspecified intracranial injury with loss of consciousness of unspecified duration, sequela: Secondary | ICD-10-CM | POA: Diagnosis not present

## 2023-01-31 ENCOUNTER — Telehealth (INDEPENDENT_AMBULATORY_CARE_PROVIDER_SITE_OTHER): Payer: Self-pay | Admitting: Vascular Surgery

## 2023-01-31 NOTE — Telephone Encounter (Signed)
Returned pt's husband's VM stating that they need to R/S appt for pt. LVM for someone to call back and schedule appt.

## 2023-02-01 DIAGNOSIS — H9192 Unspecified hearing loss, left ear: Secondary | ICD-10-CM | POA: Diagnosis not present

## 2023-02-01 DIAGNOSIS — E785 Hyperlipidemia, unspecified: Secondary | ICD-10-CM | POA: Diagnosis not present

## 2023-02-01 DIAGNOSIS — G629 Polyneuropathy, unspecified: Secondary | ICD-10-CM | POA: Diagnosis not present

## 2023-02-01 DIAGNOSIS — S069X9S Unspecified intracranial injury with loss of consciousness of unspecified duration, sequela: Secondary | ICD-10-CM | POA: Diagnosis not present

## 2023-02-01 DIAGNOSIS — Z7982 Long term (current) use of aspirin: Secondary | ICD-10-CM | POA: Diagnosis not present

## 2023-02-01 DIAGNOSIS — J4489 Other specified chronic obstructive pulmonary disease: Secondary | ICD-10-CM | POA: Diagnosis not present

## 2023-02-01 DIAGNOSIS — R29898 Other symptoms and signs involving the musculoskeletal system: Secondary | ICD-10-CM | POA: Diagnosis not present

## 2023-02-01 DIAGNOSIS — I480 Paroxysmal atrial fibrillation: Secondary | ICD-10-CM | POA: Diagnosis not present

## 2023-02-01 DIAGNOSIS — Z7902 Long term (current) use of antithrombotics/antiplatelets: Secondary | ICD-10-CM | POA: Diagnosis not present

## 2023-02-01 DIAGNOSIS — J4550 Severe persistent asthma, uncomplicated: Secondary | ICD-10-CM | POA: Diagnosis not present

## 2023-02-01 DIAGNOSIS — Z7951 Long term (current) use of inhaled steroids: Secondary | ICD-10-CM | POA: Diagnosis not present

## 2023-02-01 DIAGNOSIS — E43 Unspecified severe protein-calorie malnutrition: Secondary | ICD-10-CM | POA: Diagnosis not present

## 2023-02-01 DIAGNOSIS — I10 Essential (primary) hypertension: Secondary | ICD-10-CM | POA: Diagnosis not present

## 2023-02-01 DIAGNOSIS — M25559 Pain in unspecified hip: Secondary | ICD-10-CM | POA: Diagnosis not present

## 2023-02-01 DIAGNOSIS — Z950 Presence of cardiac pacemaker: Secondary | ICD-10-CM | POA: Diagnosis not present

## 2023-02-01 DIAGNOSIS — I2510 Atherosclerotic heart disease of native coronary artery without angina pectoris: Secondary | ICD-10-CM | POA: Diagnosis not present

## 2023-02-01 DIAGNOSIS — Z87891 Personal history of nicotine dependence: Secondary | ICD-10-CM | POA: Diagnosis not present

## 2023-02-01 DIAGNOSIS — G8929 Other chronic pain: Secondary | ICD-10-CM | POA: Diagnosis not present

## 2023-02-01 DIAGNOSIS — I739 Peripheral vascular disease, unspecified: Secondary | ICD-10-CM | POA: Diagnosis not present
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
## 2023-02-03 ENCOUNTER — Encounter: Payer: Self-pay | Admitting: Family Medicine

## 2023-02-03 ENCOUNTER — Ambulatory Visit (INDEPENDENT_AMBULATORY_CARE_PROVIDER_SITE_OTHER): Payer: 59 | Admitting: Family Medicine

## 2023-02-03 VITALS — BP 114/68 | HR 87 | Temp 97.6°F | Resp 16 | Ht 65.0 in | Wt 120.2 lb

## 2023-02-03 DIAGNOSIS — I480 Paroxysmal atrial fibrillation: Secondary | ICD-10-CM

## 2023-02-03 DIAGNOSIS — I25119 Atherosclerotic heart disease of native coronary artery with unspecified angina pectoris: Secondary | ICD-10-CM

## 2023-02-03 DIAGNOSIS — F17200 Nicotine dependence, unspecified, uncomplicated: Secondary | ICD-10-CM | POA: Diagnosis not present

## 2023-02-03 DIAGNOSIS — I10 Essential (primary) hypertension: Secondary | ICD-10-CM | POA: Diagnosis not present

## 2023-02-03 DIAGNOSIS — Z780 Asymptomatic menopausal state: Secondary | ICD-10-CM | POA: Diagnosis not present

## 2023-02-03 DIAGNOSIS — Z8701 Personal history of pneumonia (recurrent): Secondary | ICD-10-CM

## 2023-02-03 DIAGNOSIS — Z5181 Encounter for therapeutic drug level monitoring: Secondary | ICD-10-CM | POA: Diagnosis not present

## 2023-02-03 DIAGNOSIS — N1831 Chronic kidney disease, stage 3a: Secondary | ICD-10-CM

## 2023-02-03 DIAGNOSIS — N179 Acute kidney failure, unspecified: Secondary | ICD-10-CM

## 2023-02-03 DIAGNOSIS — Z1231 Encounter for screening mammogram for malignant neoplasm of breast: Secondary | ICD-10-CM | POA: Diagnosis not present

## 2023-02-03 DIAGNOSIS — J441 Chronic obstructive pulmonary disease with (acute) exacerbation: Secondary | ICD-10-CM | POA: Diagnosis not present

## 2023-02-03 DIAGNOSIS — Z8709 Personal history of other diseases of the respiratory system: Secondary | ICD-10-CM

## 2023-02-03 MED ORDER — TRELEGY ELLIPTA 100-62.5-25 MCG/ACT IN AEPB: 1.0000 | INHALATION_SPRAY | Freq: Every day | RESPIRATORY_TRACT | 1 refills | Status: DC

## 2023-02-03 NOTE — Patient Instructions (Signed)
Call [place] to schedule your mammogram and bone density - in August  [facility] [address], [place], [place] [address] Scheduling phone #: [phone]  Health Maintenance  Topic Date Due   DEXA scan (bone density measurement)  Never done   COVID-19 Vaccine (4 - 2023-24 season) 05/27/2022   Screening for Lung Cancer  01/14/2023   Zoster (Shingles) Vaccine (2 of 2) 03/08/2023*   DTaP/Tdap/Td vaccine (2 - Td or Tdap) 04/11/2023   Flu Shot  04/27/2023   Mammogram  05/06/2023   Medicare Annual Wellness Visit  06/22/2023   Colon Cancer Screening  12/15/2025   Pneumonia Vaccine  Completed   Hepatitis C Screening: USPSTF Recommendation to screen - Ages 18-79 yo.  Completed   HPV Vaccine  Aged Out  *Topic was postponed. The date shown is not the original due date.   You are up to date on colonoscopy and colon cancer screening   Talk to pulmonology about what daily maintenance inhaler they want you on  And talk to them about when you should do your Lung Cancer screening CT

## 2023-02-04 LAB — COMPLETE METABOLIC PANEL WITH GFR
AG Ratio: 1.3 (calc) (ref 1.0–2.5)
ALT: 6 U/L (ref 6–29)
AST: 9 U/L — ABNORMAL LOW (ref 10–35)
Albumin: 4 g/dL (ref 3.6–5.1)
Alkaline phosphatase (APISO): 68 U/L (ref 37–153)
BUN/Creatinine Ratio: 13 (calc) (ref 6–22)
BUN: 14 mg/dL (ref 7–25)
CO2: 24 mmol/L (ref 20–32)
Calcium: 9.7 mg/dL (ref 8.6–10.4)
Chloride: 108 mmol/L (ref 98–110)
Creat: 1.06 mg/dL — ABNORMAL HIGH (ref 0.50–1.05)
Globulin: 3 g/dL (calc) (ref 1.9–3.7)
Glucose, Bld: 129 mg/dL — ABNORMAL HIGH (ref 65–99)
Potassium: 3.6 mmol/L (ref 3.5–5.3)
Sodium: 142 mmol/L (ref 135–146)
Total Bilirubin: 0.3 mg/dL (ref 0.2–1.2)
Total Protein: 7 g/dL (ref 6.1–8.1)
eGFR: 57 mL/min/{1.73_m2} — ABNORMAL LOW (ref >=60)

## 2023-02-04 LAB — CBC WITH DIFFERENTIAL/PLATELET
Absolute Monocytes: 511 cells/uL (ref 200–950)
Basophils Absolute: 72 cells/uL (ref 0–200)
Basophils Relative: 1 %
Eosinophils Absolute: 86 cells/uL (ref 15–500)
Eosinophils Relative: 1.2 %
HCT: 39.1 % (ref 35.0–45.0)
Hemoglobin: 12.7 g/dL (ref 11.7–15.5)
Lymphs Abs: 1685 cells/uL (ref 850–3900)
MCH: 29.9 pg (ref 27.0–33.0)
MCHC: 32.5 g/dL (ref 32.0–36.0)
MCV: 92 fL (ref 80.0–100.0)
MPV: 10.4 fL (ref 7.5–12.5)
Monocytes Relative: 7.1 %
Neutro Abs: 4846 cells/uL (ref 1500–7800)
Neutrophils Relative %: 67.3 %
Platelets: 227 10*3/uL (ref 140–400)
RBC: 4.25 10*6/uL (ref 3.80–5.10)
RDW: 12.4 % (ref 11.0–15.0)
Total Lymphocyte: 23.4 %
WBC: 7.2 10*3/uL (ref 3.8–10.8)

## 2023-02-06 ENCOUNTER — Other Ambulatory Visit: Payer: Self-pay | Admitting: Physician Assistant

## 2023-02-07 NOTE — Telephone Encounter (Signed)
Requested Prescriptions  Pending Prescriptions Disp Refills   clopidogrel (PLAVIX) 75 MG tablet [Pharmacy Med Name: CLOPIDOGREL BISULFATE 75 MG TAB] 90 tablet 0    Sig: TAKE 1 TABLET BY MOUTH ONCE DAILY     Hematology: Antiplatelets - clopidogrel Failed - 02/06/2023  9:23 AM      Failed - Cr in normal range and within 360 days    Creat  Date Value Ref Range Status  02/03/2023 1.06 (H) 0.50 - 1.05 mg/dL Final   Creatinine, Urine  Date Value Ref Range Status  03/30/2017 176 20 - 320 mg/dL Final         Passed - HCT in normal range and within 180 days    HCT  Date Value Ref Range Status  02/03/2023 39.1 35.0 - 45.0 % Final   Hematocrit  Date Value Ref Range Status  09/17/2015 47.1 (H) 34.0 - 46.6 % Final         Passed - HGB in normal range and within 180 days    Hemoglobin  Date Value Ref Range Status  02/03/2023 12.7 11.7 - 15.5 g/dL Final  25/36/6440 34.7 (H) 11.1 - 15.9 g/dL Final         Passed - PLT in normal range and within 180 days    Platelets  Date Value Ref Range Status  02/03/2023 227 140 - 400 Thousand/uL Final  09/17/2015 167 150 - 379 x10E3/uL Final         Passed - Valid encounter within last 6 months    Recent Outpatient Visits           4 days ago Chronic obstructive pulmonary disease with acute exacerbation [facility])   [facility] Danelle Berry, PA-C   1 month ago Encounter for examination following treatment at hospital   [facility] Danelle Berry, PA-C   2 months ago Essential hypertension, benign   [facility] Danelle Berry, PA-C   4 months ago Dysuria   [facility] Berniece Salines, FNP   5 months ago Essential hypertension, benign   [facility] Danelle Berry, PA-C       Future Appointments             In 1 month Danelle Berry, PA-C [facility]   In 4 months  [facility]

## 2023-02-28 ENCOUNTER — Encounter: Payer: Self-pay | Admitting: Nurse Practitioner

## 2023-02-28 ENCOUNTER — Ambulatory Visit (INDEPENDENT_AMBULATORY_CARE_PROVIDER_SITE_OTHER): Payer: 59 | Admitting: Nurse Practitioner

## 2023-02-28 VITALS — BP 110/72 | HR 66 | Temp 97.3°F | Ht 65.0 in | Wt 118.8 lb

## 2023-02-28 DIAGNOSIS — F17210 Nicotine dependence, cigarettes, uncomplicated: Secondary | ICD-10-CM | POA: Diagnosis not present

## 2023-02-28 DIAGNOSIS — Z720 Tobacco use: Secondary | ICD-10-CM

## 2023-02-28 DIAGNOSIS — J418 Mixed simple and mucopurulent chronic bronchitis: Secondary | ICD-10-CM
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
## 2023-02-28 NOTE — Patient Instructions (Addendum)
Continue Albuterol inhaler 2 puffs or duoneb 3 mL neb every 6 hours as needed for shortness of breath or wheezing. Notify if symptoms persist despite rescue inhaler/neb use.  Continue Trelegy 1 puff daily. Brush tongue and rinse mouth afterwards  Overnight oxygen study - someone will contact you to schedule this Outpatient arterial blood gas to look at your CO2 levels   Attend lung cancer screening CT once scheduled - someone should contact you in the next week or so to get this scheduled  Follow up after PFTs in 4-6 weeks with Dr. Craige Cotta or Katie France Noyce,NP. If symptoms do not improve or worsen, please contact office for sooner follow up or seek emergency care.
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
## 2023-03-03 ENCOUNTER — Encounter: Payer: Self-pay | Admitting: Nurse Practitioner

## 2023-03-03 DIAGNOSIS — Z720 Tobacco use: Secondary | ICD-10-CM | POA: Insufficient documentation

## 2023-03-03 NOTE — Assessment & Plan Note (Addendum)
Active smoker. 147 pack year hx. Missed LDCT chest this year for lung cancer screening. Message sent to program coordinator to get her rescheduled. Smoking cessation strongly advised.   The patient's current tobacco use: 0.5-1 ppd The patient was advised to quit and impact of smoking on their health.  I assessed the patient's willingness to attempt to quit. I provided methods and skills for cessation. We reviewed medication management of smoking session drugs if appropriate. Resources to help quit smoking were provided. A smoking cessation quit date was set: 03/30/2023 Follow-up was arranged in our clinic.  The amount of time spent counseling patient was 4 mins

## 2023-03-03 NOTE — Assessment & Plan Note (Signed)
Clinically improved followed hospitalization in March 2024 for lithium toxicity and acute encephalopathy. She appears relatively compensated on current regimen; however, still having more trouble with AM grogginess and DOE. Possibly due to psychiatric medications. Given her lung disease, warrants further workup to rule out nocturnal hypoxia or hypercarbia as potential causes. Will plan for ONO on room air and outpatient ABG on room air. Repeat PFTs. Continue triple therapy regimen. Action plan in place.  Patient Instructions  Continue Albuterol inhaler 2 puffs or duoneb 3 mL neb every 6 hours as needed for shortness of breath or wheezing. Notify if symptoms persist despite rescue inhaler/neb use.  Continue Trelegy 1 puff daily. Brush tongue and rinse mouth afterwards  Overnight oxygen study - someone will contact you to schedule this Outpatient arterial blood gas to look at your CO2 levels   Attend lung cancer screening CT once scheduled - someone should contact you in the next week or so to get this scheduled  Follow up after PFTs in 4-6 weeks with Dr. Craige Cotta or Katie Rosielee Corporan,NP. If symptoms do not improve or worsen, please contact office for sooner follow up or seek emergency care.

## 2023-03-03 NOTE — Progress Notes (Signed)
Reviewed and agree with assessment/plan.   Coralyn Helling, MD [facility] Pulmonary/Critical Care 03/03/2023, 2:34 PM Pager:  [phone]

## 2023-03-06 ENCOUNTER — Encounter (INDEPENDENT_AMBULATORY_CARE_PROVIDER_SITE_OTHER): Payer: Medicare Other

## 2023-03-06 ENCOUNTER — Ambulatory Visit (INDEPENDENT_AMBULATORY_CARE_PROVIDER_SITE_OTHER): Payer: 59 | Admitting: Vascular Surgery

## 2023-03-07 ENCOUNTER — Ambulatory Visit: Payer: 59 | Attending: Nurse Practitioner

## 2023-03-09 ENCOUNTER — Other Ambulatory Visit: Payer: Self-pay | Admitting: Family Medicine

## 2023-03-09 ENCOUNTER — Ambulatory Visit: Payer: 59 | Admitting: Family Medicine

## 2023-03-09 DIAGNOSIS — J441 Chronic obstructive pulmonary disease with (acute) exacerbation: Secondary | ICD-10-CM

## 2023-03-10 ENCOUNTER — Telehealth: Payer: Self-pay | Admitting: *Deleted

## 2023-03-10 NOTE — Telephone Encounter (Signed)
Left message at both home and mobile number for pt to call back to reschedule lung screening CT.

## 2023-03-14 ENCOUNTER — Other Ambulatory Visit: Payer: Self-pay | Admitting: Family Medicine

## 2023-03-14 DIAGNOSIS — I10 Essential (primary) hypertension: Secondary | ICD-10-CM

## 2023-04-05 ENCOUNTER — Ambulatory Visit (INDEPENDENT_AMBULATORY_CARE_PROVIDER_SITE_OTHER): Payer: 59 | Admitting: Family Medicine

## 2023-04-05 ENCOUNTER — Ambulatory Visit: Payer: 59 | Admitting: Nurse Practitioner

## 2023-04-05 ENCOUNTER — Encounter: Payer: Self-pay | Admitting: Nurse Practitioner

## 2023-04-05 ENCOUNTER — Encounter: Payer: Self-pay | Admitting: Family Medicine

## 2023-04-05 VITALS — BP 142/82 | HR 87 | Temp 97.9°F | Resp 16 | Ht 65.0 in | Wt 115.5 lb

## 2023-04-05 VITALS — BP 140/70 | HR 82 | Temp 97.6°F | Ht 66.0 in | Wt 116.6 lb

## 2023-04-05 DIAGNOSIS — I10 Essential (primary) hypertension: Secondary | ICD-10-CM | POA: Diagnosis not present

## 2023-04-05 DIAGNOSIS — N1831 Chronic kidney disease, stage 3a: Secondary | ICD-10-CM | POA: Diagnosis not present

## 2023-04-05 DIAGNOSIS — F17210 Nicotine dependence, cigarettes, uncomplicated: Secondary | ICD-10-CM | POA: Diagnosis not present

## 2023-04-05 DIAGNOSIS — F3160 Bipolar disorder, current episode mixed, unspecified: Secondary | ICD-10-CM

## 2023-04-05 DIAGNOSIS — S069X9S Unspecified intracranial injury with loss of consciousness of unspecified duration, sequela: Secondary | ICD-10-CM | POA: Diagnosis not present

## 2023-04-05 DIAGNOSIS — E785 Hyperlipidemia, unspecified: Secondary | ICD-10-CM

## 2023-04-05 DIAGNOSIS — E782 Mixed hyperlipidemia: Secondary | ICD-10-CM

## 2023-04-05 DIAGNOSIS — J441 Chronic obstructive pulmonary disease with (acute) exacerbation: Secondary | ICD-10-CM | POA: Diagnosis not present

## 2023-04-05 DIAGNOSIS — R269 Unspecified abnormalities of gait and mobility: Secondary | ICD-10-CM

## 2023-04-05 DIAGNOSIS — I70223 Atherosclerosis of native arteries of extremities with rest pain, bilateral legs: Secondary | ICD-10-CM | POA: Diagnosis not present

## 2023-04-05 DIAGNOSIS — I25119 Atherosclerotic heart disease of native coronary artery with unspecified angina pectoris: Secondary | ICD-10-CM

## 2023-04-05 DIAGNOSIS — I739 Peripheral vascular disease, unspecified: Secondary | ICD-10-CM

## 2023-04-05 DIAGNOSIS — I480 Paroxysmal atrial fibrillation: Secondary | ICD-10-CM | POA: Diagnosis not present

## 2023-04-05 DIAGNOSIS — Z87891 Personal history of nicotine dependence: Secondary | ICD-10-CM

## 2023-04-05 DIAGNOSIS — J449 Chronic obstructive pulmonary disease, unspecified: Secondary | ICD-10-CM

## 2023-04-05 DIAGNOSIS — E7849 Other hyperlipidemia: Secondary | ICD-10-CM

## 2023-04-05 MED ORDER — ASPIRIN 81 MG PO TBEC: 81.0000 mg | DELAYED_RELEASE_TABLET | Freq: Every day | ORAL | 5 refills | Status: DC

## 2023-04-05 MED ORDER — AMLODIPINE BESYLATE 10 MG PO TABS
10.0000 mg | ORAL_TABLET | Freq: Every day | ORAL | 5 refills | Status: DC
Start: 2023-04-05 — End: 2023-10-12

## 2023-04-05 MED ORDER — LOSARTAN POTASSIUM 100 MG PO TABS: 100.0000 mg | ORAL_TABLET | Freq: Every morning | ORAL | 5 refills | Status: DC

## 2023-04-05 MED ORDER — ATORVASTATIN CALCIUM 20 MG PO TABS: 20.0000 mg | ORAL_TABLET | Freq: Every day | ORAL | 5 refills | Status: DC

## 2023-04-05 MED ORDER — CLOPIDOGREL BISULFATE 75 MG PO TABS
75.0000 mg | ORAL_TABLET | Freq: Every day | ORAL | 5 refills | Status: DC
Start: 2023-04-05 — End: 2024-02-06
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
## 2023-04-05 NOTE — Telephone Encounter (Unsigned)
Copied from CRM [id number]. Topic: General - Other >> Apr 05, 2023 11:37 AM Alfred Levins wrote: Kem Kays with [facility] needs patients RXs list faxed to them at fax #[phone] please make Attention to : [facility]

## 2023-04-05 NOTE — Assessment & Plan Note (Signed)
Clinically improved. She appears relatively compensated on current regimen; however, still having more trouble with AM grogginess and DOE. Possibly due to psychiatric medications. She was supposed to have ONO to rule out nocturnal hypoxia and ABG to assess for hypercarbia. She has yet to do these. Discussed the importance today. Verbalized understanding. We will reschedule her for these as well as repeat PFTs. Continue triple therapy regimen. Action plan in place. Smoking cessation advised.   Patient Instructions  Continue Albuterol inhaler 2 puffs or duoneb 3 mL neb every 6 hours as needed for shortness of breath or wheezing. Notify if symptoms persist despite rescue inhaler/neb use.  Continue Trelegy 1 puff daily. Brush tongue and rinse mouth afterwards   Overnight oxygen study - complete tonight and return  Outpatient arterial blood gas to look at your CO2 levels    Attend CT of the chest on 7/16  Work on quitting smoking. Goal quit date 05/06/2023   Follow up after PFTs in 4-6 weeks with Dr. Craige Cotta or Philis Nettle. If symptoms do not improve or worsen, please contact office for sooner follow up or seek emergency care.

## 2023-04-05 NOTE — Patient Instructions (Addendum)
Continue Albuterol inhaler 2 puffs or duoneb 3 mL neb every 6 hours as needed for shortness of breath or wheezing. Notify if symptoms persist despite rescue inhaler/neb use.  Continue Trelegy 1 puff daily. Brush tongue and rinse mouth afterwards   Overnight oxygen study - complete tonight and return  Outpatient arterial blood gas to look at your CO2 levels    Attend CT of the chest on 7/16  Work on quitting smoking. Goal quit date 05/06/2023   Follow up after PFTs in 4-6 weeks with Dr. Craige Cotta or Philis Nettle. If symptoms do not improve or worsen, please contact office for sooner follow up or seek emergency care.
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
## 2023-04-05 NOTE — Assessment & Plan Note (Signed)
Active heavy smoker. 174 pack year history. Lung cancer screening CT scheduled for 7/16.   The patient's current tobacco use: 1 ppd The patient was advised to quit and impact of smoking on their health.  I assessed the patient's willingness to attempt to quit. I provided methods and skills for cessation. We reviewed medication management of smoking session drugs if appropriate. Resources to help quit smoking were provided. A smoking cessation quit date was set: 05/06/2023 Follow-up was arranged in our clinic.  The amount of time spent counseling patient was 4 mins

## 2023-04-05 NOTE — Telephone Encounter (Signed)
Faxed med list today.

## 2023-04-07 ENCOUNTER — Telehealth: Payer: Self-pay | Admitting: Family Medicine

## 2023-04-07 NOTE — Telephone Encounter (Signed)
FYI: Cynthia [name] (husband) wanted to inform [name] that pt did her at home sleep study last night and he is in the process of trying to return the equipment to the facility that sent it.

## 2023-04-07 NOTE — Progress Notes (Signed)
Reviewed and agree with assessment/plan.   Coralyn Helling, MD [facility] Pulmonary/Critical Care 04/07/2023, 8:52 AM Pager:  [phone]

## 2023-04-07 NOTE — Telephone Encounter (Signed)
Patient's husband Mellody Dance called and wanted to let you know that he did get equipment mailed back this morning at the post office. Per husband, call his number when the results of patient comes back @ [phone].

## 2023-04-07 NOTE — Telephone Encounter (Signed)
Per Cassandra- she helped him out by letting him know to call the number attached. Pt left stating he was going to do that

## 2023-04-11 ENCOUNTER — Ambulatory Visit: Payer: 59

## 2023-04-13 ENCOUNTER — Encounter: Payer: Self-pay | Admitting: Family Medicine

## 2023-04-13 DIAGNOSIS — I70223 Atherosclerosis of native arteries of extremities with rest pain, bilateral legs: Secondary | ICD-10-CM | POA: Insufficient documentation

## 2023-04-13 DIAGNOSIS — N1831 Chronic kidney disease, stage 3a: Secondary | ICD-10-CM | POA: Insufficient documentation

## 2023-04-13 NOTE — Assessment & Plan Note (Signed)
Managing/following with vascular On statin and ASA

## 2023-04-13 NOTE — Assessment & Plan Note (Signed)
Lab Results  Component Value Date   EGFR 57 (L) 02/03/2023   EGFR 45 (L) 12/06/2022   EGFR 52 (L) 09/06/2022   EGFR 54 (L) 07/08/2021  Renal function has been stable in CKD stage 3a range on losartan and norvasc Not currently est with nephro, will continue to monitor

## 2023-04-13 NOTE — Assessment & Plan Note (Signed)
On statin, good compliance, no SE or concerns Lab Results  Component Value Date   CHOL 119 09/06/2022   HDL 56 09/06/2022   LDLCALC 36 09/06/2022   TRIG 87 12/19/2022   CHOLHDL 2.1 09/06/2022

## 2023-04-13 NOTE — Assessment & Plan Note (Signed)
Bp had improved on norvasc and losartan, but she didn't take meds today and BP slightly elevated No med changes Encourage pt to take medications and promised her I would try to make it as few of pills as possible but she does require meds BP Readings from Last 3 Encounters:  04/05/23 (!) 140/70  04/05/23 (!) 142/82  02/28/23 110/72

## 2023-04-13 NOTE — Assessment & Plan Note (Signed)
Implanted pacemaker defib per cardiology, not able to be anticoagulated due to high risk of falls/bleed
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
## 2023-04-13 NOTE — Assessment & Plan Note (Signed)
Managing with pulmonology, lungs clear today

## 2023-04-13 NOTE — Assessment & Plan Note (Signed)
Mood continues to be worse that prior baseline Cynthia [name] at [employer]

## 2023-04-13 NOTE — Assessment & Plan Note (Signed)
On statin ASA and following with vascular

## 2023-04-13 NOTE — Assessment & Plan Note (Signed)
Per specialists 

## 2023-04-14 ENCOUNTER — Ambulatory Visit: Admission: RE | Admit: 2023-04-14 | Payer: 59 | Source: Ambulatory Visit

## 2023-04-17 DIAGNOSIS — J418 Mixed simple and mucopurulent chronic bronchitis: Secondary | ICD-10-CM | POA: Diagnosis not present

## 2023-04-19 DIAGNOSIS — I70219 Atherosclerosis of native arteries of extremities with intermittent claudication, unspecified extremity: Secondary | ICD-10-CM | POA: Insufficient documentation
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
## 2023-04-20 ENCOUNTER — Ambulatory Visit (INDEPENDENT_AMBULATORY_CARE_PROVIDER_SITE_OTHER): Payer: 59 | Admitting: Vascular Surgery

## 2023-04-20 ENCOUNTER — Encounter (INDEPENDENT_AMBULATORY_CARE_PROVIDER_SITE_OTHER): Payer: Self-pay | Admitting: Vascular Surgery

## 2023-04-20 ENCOUNTER — Telehealth: Payer: Self-pay | Admitting: Nurse Practitioner

## 2023-04-20 ENCOUNTER — Ambulatory Visit (INDEPENDENT_AMBULATORY_CARE_PROVIDER_SITE_OTHER): Payer: 59

## 2023-04-20 VITALS — BP 120/62 | HR 60 | Resp 18 | Ht 66.0 in | Wt 113.8 lb

## 2023-04-20 DIAGNOSIS — I70213 Atherosclerosis of native arteries of extremities with intermittent claudication, bilateral legs: Secondary | ICD-10-CM

## 2023-04-20 DIAGNOSIS — I10 Essential (primary) hypertension: Secondary | ICD-10-CM | POA: Diagnosis not present

## 2023-04-20 DIAGNOSIS — I480 Paroxysmal atrial fibrillation: Secondary | ICD-10-CM | POA: Diagnosis not present

## 2023-04-20 DIAGNOSIS — I70223 Atherosclerosis of native arteries of extremities with rest pain, bilateral legs: Secondary | ICD-10-CM

## 2023-04-20 DIAGNOSIS — J441 Chronic obstructive pulmonary disease with (acute) exacerbation: Secondary | ICD-10-CM

## 2023-04-20 DIAGNOSIS — I25119 Atherosclerotic heart disease of native coronary artery with unspecified angina pectoris: Secondary | ICD-10-CM | POA: Diagnosis not present

## 2023-04-21 NOTE — Telephone Encounter (Signed)
I have notified the patient and her husband.  Nothing further needed.

## 2023-04-23 ENCOUNTER — Encounter (INDEPENDENT_AMBULATORY_CARE_PROVIDER_SITE_OTHER): Payer: Self-pay | Admitting: Vascular Surgery

## 2023-04-25 ENCOUNTER — Ambulatory Visit
Admission: RE | Admit: 2023-04-25 | Discharge: 2023-04-25 | Disposition: A | Discharge status: Home / Self Care | Payer: 59 | Source: Ambulatory Visit | Attending: Acute Care | Admitting: Acute Care

## 2023-04-25 DIAGNOSIS — F17210 Nicotine dependence, cigarettes, uncomplicated: Secondary | ICD-10-CM | POA: Diagnosis not present

## 2023-04-25 DIAGNOSIS — Z122 Encounter for screening for malignant neoplasm of respiratory organs: Secondary | ICD-10-CM | POA: Insufficient documentation

## 2023-04-25 DIAGNOSIS — Z87891 Personal history of nicotine dependence: Secondary | ICD-10-CM | POA: Insufficient documentation

## 2023-04-28 ENCOUNTER — Ambulatory Visit (INDEPENDENT_AMBULATORY_CARE_PROVIDER_SITE_OTHER): Payer: 59

## 2023-04-28 VITALS — BP 104/62 | Ht 66.0 in | Wt 118.9 lb

## 2023-04-28 DIAGNOSIS — Z0000 Encounter for general adult medical examination without abnormal findings: Secondary | ICD-10-CM | POA: Diagnosis not present

## 2023-04-28 DIAGNOSIS — Z13820 Encounter for screening for osteoporosis: Secondary | ICD-10-CM

## 2023-04-28 DIAGNOSIS — Z0100 Encounter for examination of eyes and vision without abnormal findings: Secondary | ICD-10-CM
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
## 2023-04-28 NOTE — Patient Instructions (Signed)
Ms. Cynthia [name] , Thank you for taking time to come for your Medicare Wellness Visit. I appreciate your ongoing commitment to your health goals. Please review the following plan we discussed and let me know if I can assist you in the future.   Referrals/Orders/Follow-Ups/Clinician Recommendations: Ophthalmology, Bone Density referral  This is a list of the screening recommended for you and due dates:  Health Maintenance  Topic Date Due   DEXA scan (bone density measurement)  Never done   COVID-19 Vaccine (4 - 2023-24 season) 05/27/2022   Screening for Lung Cancer  01/14/2023   DTaP/Tdap/Td vaccine (2 - Td or Tdap) 04/11/2023   Flu Shot  04/27/2023   Zoster (Shingles) Vaccine (2 of 2) 07/06/2023*   Mammogram  05/06/2023   Medicare Annual Wellness Visit  04/27/2024   Colon Cancer Screening  12/15/2025   Pneumonia Vaccine  Completed   Hepatitis C Screening  Completed   HPV Vaccine  Aged Out  *Topic was postponed. The date shown is not the original due date.    Advanced directives: (Copy Requested) Please bring a copy of your health care power of attorney and living will to the office to be added to your chart at your convenience.  Next Medicare Annual Wellness Visit scheduled for next year: Yes 05/03/24 @ 10:45am telephone  Preventive Care 65 Years and Older, Female Preventive care refers to lifestyle choices and visits with your health care provider that can promote health and wellness. What does preventive care include? A yearly physical exam. This is also called an annual well check. Dental exams once or twice a year. Routine eye exams. Ask your health care provider how often you should have your eyes checked. Personal lifestyle choices, including: Daily care of your teeth and gums. Regular physical activity. Eating a healthy diet. Avoiding tobacco and drug use. Limiting alcohol use. Practicing safe sex. Taking low-dose aspirin every day. Taking vitamin and mineral supplements as  recommended by your health care provider. What happens during an annual well check? The services and screenings done by your health care provider during your annual well check will depend on your age, overall health, lifestyle risk factors, and family history of disease. Counseling  Your health care provider may ask you questions about your: Alcohol use. Tobacco use. Drug use. Emotional well-being. Home and relationship well-being. Sexual activity. Eating habits. History of falls. Memory and ability to understand (cognition). Work and work Astronomer. Reproductive health. Screening  You may have the following tests or measurements: Height, weight, and BMI. Blood pressure. Lipid and cholesterol levels. These may be checked every 5 years, or more frequently if you are over 80 years old. Skin check. Lung cancer screening. You may have this screening every year starting at age 68 if you have a 30-pack-year history of smoking and currently smoke or have quit within the past 15 years. Fecal occult blood test (FOBT) of the stool. You may have this test every year starting at age 68. Flexible sigmoidoscopy or colonoscopy. You may have a sigmoidoscopy every 5 years or a colonoscopy every 10 years starting at age 68. Hepatitis C blood test. Hepatitis B blood test. Sexually transmitted disease (STD) testing. Diabetes screening. This is done by checking your blood sugar (glucose) after you have not eaten for a while (fasting). You may have this done every 1-3 years. Bone density scan. This is done to screen for osteoporosis. You may have this done starting at age 68. Mammogram. This may be done every 1-2 years.  Talk to your health care provider about how often you should have regular mammograms. Talk with your health care provider about your test results, treatment options, and if necessary, the need for more tests. Vaccines  Your health care provider may recommend certain vaccines, such  as: Influenza vaccine. This is recommended every year. Tetanus, diphtheria, and acellular pertussis (Tdap, Td) vaccine. You may need a Td booster every 10 years. Zoster vaccine. You may need this after age 68. Pneumococcal 13-valent conjugate (PCV13) vaccine. One dose is recommended after age 68. Pneumococcal polysaccharide (PPSV23) vaccine. One dose is recommended after age 68. Talk to your health care provider about which screenings and vaccines you need and how often you need them. This information is not intended to replace advice given to you by your health care provider. Make sure you discuss any questions you have with your health care provider. Document Released: 10/09/2015 Document Revised: 06/01/2016 Document Reviewed: 07/14/2015 Elsevier Interactive Patient Education  2017 [employer].  Fall Prevention in the Home Falls can cause injuries. They can happen to people of all ages. There are many things you can do to make your home safe and to help prevent falls. What can I do on the outside of my home? Regularly fix the edges of walkways and driveways and fix any cracks. Remove anything that might make you trip as you walk through a door, such as a raised step or threshold. Trim any bushes or trees on the path to your home. Use bright outdoor lighting. Clear any walking paths of anything that might make someone trip, such as rocks or tools. Regularly check to see if handrails are loose or broken. Make sure that both sides of any steps have handrails. Any raised decks and porches should have guardrails on the edges. Have any leaves, snow, or ice cleared regularly. Use sand or salt on walking paths during winter. Clean up any spills in your garage right away. This includes oil or grease spills. What can I do in the bathroom? Use night lights. Install grab bars by the toilet and in the tub and shower. Do not use towel bars as grab bars. Use non-skid mats or decals in the tub or  shower. If you need to sit down in the shower, use a plastic, non-slip stool. Keep the floor dry. Clean up any water that spills on the floor as soon as it happens. Remove soap buildup in the tub or shower regularly. Attach bath mats securely with double-sided non-slip rug tape. Do not have throw rugs and other things on the floor that can make you trip. What can I do in the bedroom? Use night lights. Make sure that you have a light by your bed that is easy to reach. Do not use any sheets or blankets that are too big for your bed. They should not hang down onto the floor. Have a firm chair that has side arms. You can use this for support while you get dressed. Do not have throw rugs and other things on the floor that can make you trip. What can I do in the kitchen? Clean up any spills right away. Avoid walking on wet floors. Keep items that you use a lot in easy-to-reach places. If you need to reach something above you, use a strong step stool that has a grab bar. Keep electrical cords out of the way. Do not use floor polish or wax that makes floors slippery. If you must use wax, use non-skid floor wax.  Do not have throw rugs and other things on the floor that can make you trip. What can I do with my stairs? Do not leave any items on the stairs. Make sure that there are handrails on both sides of the stairs and use them. Fix handrails that are broken or loose. Make sure that handrails are as long as the stairways. Check any carpeting to make sure that it is firmly attached to the stairs. Fix any carpet that is loose or worn. Avoid having throw rugs at the top or bottom of the stairs. If you do have throw rugs, attach them to the floor with carpet tape. Make sure that you have a light switch at the top of the stairs and the bottom of the stairs. If you do not have them, ask someone to add them for you. What else can I do to help prevent falls? Wear shoes that: Do not have high heels. Have  rubber bottoms. Are comfortable and fit you well. Are closed at the toe. Do not wear sandals. If you use a stepladder: Make sure that it is fully opened. Do not climb a closed stepladder. Make sure that both sides of the stepladder are locked into place. Ask someone to hold it for you, if possible. Clearly mark and make sure that you can see: Any grab bars or handrails. First and last steps. Where the edge of each step is. Use tools that help you move around (mobility aids) if they are needed. These include: Canes. Walkers. Scooters. Crutches. Turn on the lights when you go into a dark area. Replace any light bulbs as soon as they burn out. Set up your furniture so you have a clear path. Avoid moving your furniture around. If any of your floors are uneven, fix them. If there are any pets around you, be aware of where they are. Review your medicines with your doctor. Some medicines can make you feel dizzy. This can increase your chance of falling. Ask your doctor what other things that you can do to help prevent falls. This information is not intended to replace advice given to you by your health care provider. Make sure you discuss any questions you have with your health care provider. Document Released: 07/09/2009 Document Revised: 02/18/2016 Document Reviewed: 10/17/2014 Elsevier Interactive Patient Education  2017 [employer].

## 2023-05-03 ENCOUNTER — Other Ambulatory Visit: Payer: Self-pay | Admitting: Acute Care

## 2023-05-03 ENCOUNTER — Other Ambulatory Visit: Payer: Self-pay

## 2023-05-03 DIAGNOSIS — Z0100 Encounter for examination of eyes and vision without abnormal findings: Secondary | ICD-10-CM

## 2023-05-03 DIAGNOSIS — Z122 Encounter for screening for malignant neoplasm of respiratory organs: Secondary | ICD-10-CM

## 2023-05-03 DIAGNOSIS — Z87891 Personal history of nicotine dependence: Secondary | ICD-10-CM

## 2023-05-03 DIAGNOSIS — F17210 Nicotine dependence, cigarettes, uncomplicated: Secondary | ICD-10-CM

## 2023-05-08 ENCOUNTER — Ambulatory Visit
Admission: RE | Admit: 2023-05-08 | Discharge: 2023-05-08 | Disposition: A | Discharge status: Home / Self Care | Payer: 59 | Source: Ambulatory Visit | Attending: Family Medicine | Admitting: Family Medicine

## 2023-05-08 DIAGNOSIS — Z1231 Encounter for screening mammogram for malignant neoplasm of breast: Secondary | ICD-10-CM | POA: Insufficient documentation

## 2023-05-08 DIAGNOSIS — J449 Chronic obstructive pulmonary disease, unspecified: Secondary | ICD-10-CM | POA: Insufficient documentation

## 2023-05-09 ENCOUNTER — Ambulatory Visit (INDEPENDENT_AMBULATORY_CARE_PROVIDER_SITE_OTHER): Payer: 59

## 2023-05-09 DIAGNOSIS — Z1231 Encounter for screening mammogram for malignant neoplasm of breast: Secondary | ICD-10-CM | POA: Diagnosis not present

## 2023-05-09 DIAGNOSIS — J449 Chronic obstructive pulmonary disease, unspecified: Secondary | ICD-10-CM | POA: Diagnosis not present

## 2023-05-09 LAB — PULMONARY FUNCTION TEST ARMC ONLY
DL/VA % pred: 58 %
DL/VA: 2.41 ml/min/mmHg/L
DLCO unc % pred: 45 %
DLCO unc: 9.51 ml/min/mmHg
FEF 25-75 Post: 0.98 L/sec
FEF 25-75 Pre: 0.55 L/sec
FEF2575-%Change-Post: 77 %
FEF2575-%Pred-Post: 46 %
FEF2575-%Pred-Pre: 26 %
FEV1-%Change-Post: 8 %
FEV1-%Pred-Post: 53 %
FEV1-%Pred-Pre: 49 %
FEV1-Post: 1.35 L
FEV1-Pre: 1.24 L
FEV1FVC-%Change-Post: -8 %
FEV1FVC-%Pred-Pre: 89 %
FEV6-%Change-Post: 17 %
FEV6-%Pred-Post: 66 %
FEV6-%Pred-Pre: 56 %
FEV6-Post: 2.12 L
FEV6-Pre: 1.8 L
FEV6FVC-%Change-Post: -1 %
FEV6FVC-%Pred-Post: 103 %
FEV6FVC-%Pred-Pre: 104 %
FVC-%Change-Post: 18 %
FVC-%Pred-Post: 64 %
FVC-%Pred-Pre: 54 %
FVC-Post: 2.14 L
FVC-Pre: 1.8 L
Post FEV1/FVC ratio: 63 %
Post FEV6/FVC ratio: 99 %
Pre FEV1/FVC ratio: 69 %
Pre FEV6/FVC Ratio: 100 %
RV % pred: 138 %
RV: 3.12 L
TLC % pred: 97 %
TLC: 5.23 L

## 2023-05-09 MED ORDER — ALBUTEROL SULFATE (2.5 MG/3ML) 0.083% IN NEBU
2.5000 mg | INHALATION_SOLUTION | Freq: Once | RESPIRATORY_TRACT | Status: AC
  Administered 2023-05-09: 2.5 mg via RESPIRATORY_TRACT
  Filled 2023-05-09: qty 3

## 2023-05-17 DIAGNOSIS — Z950 Presence of cardiac pacemaker: Secondary | ICD-10-CM | POA: Diagnosis not present

## 2023-05-17 DIAGNOSIS — I441 Atrioventricular block, second degree: Secondary | ICD-10-CM | POA: Diagnosis not present

## 2023-05-17 DIAGNOSIS — I70229 Atherosclerosis of native arteries of extremities with rest pain, unspecified extremity: Secondary | ICD-10-CM | POA: Diagnosis not present

## 2023-05-19 ENCOUNTER — Ambulatory Visit: Payer: 59 | Admitting: Nurse Practitioner

## 2023-05-19 ENCOUNTER — Encounter: Payer: Self-pay | Admitting: Nurse Practitioner

## 2023-05-19 VITALS — BP 138/78 | HR 66 | Temp 97.8°F | Ht 66.0 in | Wt 119.0 lb

## 2023-05-19 DIAGNOSIS — R918 Other nonspecific abnormal finding of lung field: Secondary | ICD-10-CM

## 2023-05-19 DIAGNOSIS — J441 Chronic obstructive pulmonary disease with (acute) exacerbation: Secondary | ICD-10-CM

## 2023-05-19 DIAGNOSIS — J411 Mucopurulent chronic bronchitis: Secondary | ICD-10-CM | POA: Diagnosis not present

## 2023-05-19 DIAGNOSIS — Z87891 Personal history of nicotine dependence: Secondary | ICD-10-CM | POA: Diagnosis not present

## 2023-05-19 MED ORDER — TRELEGY ELLIPTA 100-62.5-25 MCG/ACT IN AEPB
1.0000 | INHALATION_SPRAY | Freq: Every day | RESPIRATORY_TRACT | 5 refills | Status: DC
Start: 2023-05-19 — End: 2024-02-16

## 2023-05-19 NOTE — Progress Notes (Signed)
Reviewed and agree with assessment/plan.   Coralyn Helling, MD [facility] 05/19/2023, 12:21 PM Pager:  [phone]

## 2023-05-19 NOTE — Patient Instructions (Addendum)
Continue Albuterol inhaler 2 puffs or duoneb 3 mL neb every 6 hours as needed for shortness of breath or wheezing. Notify if symptoms persist despite rescue inhaler/neb use.  Continue Trelegy 1 puff daily. Brush tongue and rinse mouth afterwards    Repeat CT chest 04/2024    Follow up in 4 months with one of the new pulmonologists. If symptoms do not improve or worsen, please contact office for sooner follow up or seek emergency care.

## 2023-05-19 NOTE — Assessment & Plan Note (Signed)
Stable on recent imaging.  Next CT due August 2025 with lung cancer screening program.
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
## 2023-05-19 NOTE — Assessment & Plan Note (Signed)
Heavy smoker.  Working to cut back.  Smoking cessation again strongly advised.

## 2023-05-19 NOTE — Assessment & Plan Note (Signed)
Severe COPD with moderate symptom burden.  She is compensated on current regimen.  Strongly encouraged to quit smoking.  She is currently working on cutting back.  She will continue triple therapy and as needed [place].  Action plan in place.  Small frequent, high-protein meals encouraged.  Patient Instructions  Continue Albuterol inhaler 2 puffs or duoneb 3 mL neb every 6 hours as needed for shortness of breath or wheezing. Notify if symptoms persist despite rescue inhaler/neb use.  Continue Trelegy 1 puff daily. Brush tongue and rinse mouth afterwards    Repeat CT chest 04/2024    Follow up in 4 months with one of the new pulmonologists. If symptoms do not improve or worsen, please contact office for sooner follow up or seek emergency care.

## 2023-05-22 ENCOUNTER — Ambulatory Visit (INDEPENDENT_AMBULATORY_CARE_PROVIDER_SITE_OTHER): Payer: Medicare Other | Admitting: Vascular Surgery

## 2023-05-22 ENCOUNTER — Encounter (INDEPENDENT_AMBULATORY_CARE_PROVIDER_SITE_OTHER): Payer: Medicare Other

## 2023-05-31 DIAGNOSIS — H527 Unspecified disorder of refraction: Secondary | ICD-10-CM | POA: Diagnosis not present

## 2023-05-31 DIAGNOSIS — H2513 Age-related nuclear cataract, bilateral: Secondary | ICD-10-CM | POA: Diagnosis not present

## 2023-06-27 ENCOUNTER — Ambulatory Visit: Payer: Medicare Other

## 2023-06-30 ENCOUNTER — Ambulatory Visit: Payer: Medicare Other

## 2023-07-06 ENCOUNTER — Ambulatory Visit: Payer: 59 | Admitting: Family Medicine

## 2023-07-11 ENCOUNTER — Ambulatory Visit: Payer: 59 | Admitting: Physician Assistant

## 2023-07-18 DIAGNOSIS — R531 Weakness: Secondary | ICD-10-CM | POA: Diagnosis not present

## 2023-07-18 DIAGNOSIS — G251 Drug-induced tremor: Secondary | ICD-10-CM | POA: Diagnosis not present

## 2023-07-18 DIAGNOSIS — R202 Paresthesia of skin: Secondary | ICD-10-CM | POA: Diagnosis not present

## 2023-07-18 DIAGNOSIS — R262 Difficulty in walking, not elsewhere classified: Secondary | ICD-10-CM | POA: Diagnosis not present

## 2023-07-18 DIAGNOSIS — R519 Headache, unspecified: Secondary | ICD-10-CM | POA: Diagnosis not present

## 2023-07-18 DIAGNOSIS — R569 Unspecified convulsions: Secondary | ICD-10-CM | POA: Diagnosis not present

## 2023-07-18 DIAGNOSIS — R404 Transient alteration of awareness: Secondary | ICD-10-CM | POA: Diagnosis not present

## 2023-07-18 DIAGNOSIS — R413 Other amnesia: Secondary | ICD-10-CM | POA: Diagnosis not present

## 2023-07-18 DIAGNOSIS — Z87820 Personal history of traumatic brain injury: Secondary | ICD-10-CM | POA: Diagnosis not present

## 2023-07-18 DIAGNOSIS — R200 Anesthesia of skin: Secondary | ICD-10-CM | POA: Diagnosis not present
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
## 2023-07-21 ENCOUNTER — Ambulatory Visit: Payer: 59 | Admitting: Internal Medicine
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
## 2023-07-24 ENCOUNTER — Encounter: Payer: Self-pay | Admitting: Internal Medicine

## 2023-07-24 ENCOUNTER — Other Ambulatory Visit: Payer: Self-pay

## 2023-07-24 ENCOUNTER — Ambulatory Visit (INDEPENDENT_AMBULATORY_CARE_PROVIDER_SITE_OTHER): Payer: 59 | Admitting: Internal Medicine

## 2023-07-24 VITALS — BP 136/64 | HR 82 | Temp 98.2°F | Resp 18 | Ht 65.0 in | Wt 120.7 lb

## 2023-07-24 DIAGNOSIS — E782 Mixed hyperlipidemia: Secondary | ICD-10-CM | POA: Diagnosis not present

## 2023-07-24 DIAGNOSIS — G2581 Restless legs syndrome: Secondary | ICD-10-CM | POA: Diagnosis not present

## 2023-07-24 DIAGNOSIS — Z23 Encounter for immunization: Secondary | ICD-10-CM | POA: Diagnosis not present

## 2023-07-24 DIAGNOSIS — I10 Essential (primary) hypertension: Secondary | ICD-10-CM | POA: Diagnosis not present

## 2023-07-24 DIAGNOSIS — I480 Paroxysmal atrial fibrillation: Secondary | ICD-10-CM

## 2023-07-24 DIAGNOSIS — J449 Chronic obstructive pulmonary disease, unspecified: Secondary | ICD-10-CM

## 2023-07-24 DIAGNOSIS — R7309 Other abnormal glucose: Secondary | ICD-10-CM

## 2023-07-24 DIAGNOSIS — F3160 Bipolar disorder, current episode mixed, unspecified: Secondary | ICD-10-CM

## 2023-07-24 LAB — POCT GLYCOSYLATED HEMOGLOBIN (HGB A1C): Hemoglobin A1C: 5.5 % (ref 4.0–5.6)

## 2023-07-24 MED ORDER — ALBUTEROL SULFATE HFA 108 (90 BASE) MCG/ACT IN AERS: INHALATION_SPRAY | RESPIRATORY_TRACT | 5 refills | Status: DC

## 2023-07-24 MED ORDER — IPRATROPIUM-ALBUTEROL 0.5-2.5 (3) MG/3ML IN SOLN: 3.0000 mL | Freq: Three times a day (TID) | RESPIRATORY_TRACT | 1 refills | Status: DC | PRN

## 2023-07-24 MED ORDER — COMIRNATY 30 MCG/0.3ML IM SUSY
PREFILLED_SYRINGE | INTRAMUSCULAR | 0 refills | Status: DC
  Filled 2023-07-24: qty 0.3, 1d supply, fill #0

## 2023-07-24 MED ORDER — AREXVY 120 MCG/0.5ML IM SUSR
INTRAMUSCULAR | 0 refills | Status: DC
  Filled 2023-07-24: qty 0.5, 1d supply, fill #0

## 2023-07-24 NOTE — Patient Instructions (Addendum)
It was great seeing you today!  Plan discussed at today's visit: -Recommend RSV vaccine and COVID booster - recommend going to either [facility] in the hospital or the health department  -Flu vaccine given today -Continue Trelegy daily, can use Albuterol/Nebulizer every 4-6 hours as needed -A1c 5.5% which is good  Follow up in: 6 months   Take care and let us know if you have any questions or concerns prior to your next visit.  Dr. Caralee Ates

## 2023-08-22 ENCOUNTER — Telehealth: Payer: Self-pay | Admitting: Family Medicine

## 2023-08-22 NOTE — Telephone Encounter (Signed)
Called pt spouse back and advised we do not take care of dental referrals. They can call insurance and ask what office they can go to and if they offer that service to them. Pt and pt spouse verified understanding

## 2023-08-22 NOTE — Telephone Encounter (Signed)
Copied from CRM [id number]. Topic: General - Other >> Aug 22, 2023 12:22 PM Turkey B wrote: Reason for CRM: pt called in for referral/list of dentists for dentures

## 2023-08-22 NOTE — Telephone Encounter (Signed)
p

## 2023-10-10 ENCOUNTER — Other Ambulatory Visit: Payer: Self-pay | Admitting: Family Medicine

## 2023-10-10 DIAGNOSIS — N1831 Chronic kidney disease, stage 3a: Secondary | ICD-10-CM

## 2023-10-10 DIAGNOSIS — I10 Essential (primary) hypertension: Secondary | ICD-10-CM

## 2023-10-12 ENCOUNTER — Other Ambulatory Visit: Payer: Self-pay | Admitting: Family Medicine

## 2023-10-12 DIAGNOSIS — N1831 Chronic kidney disease, stage 3a: Secondary | ICD-10-CM

## 2023-10-12 DIAGNOSIS — I10 Essential (primary) hypertension: Secondary | ICD-10-CM

## 2023-10-12 MED ORDER — AMLODIPINE BESYLATE 10 MG PO TABS: 10.0000 mg | ORAL_TABLET | Freq: Every day | ORAL | 0 refills | Status: DC

## 2023-10-12 NOTE — Telephone Encounter (Signed)
Medication Refill -  Most Recent Primary Care Visit:  Provider: Margarita Mail  Department: [facility]  Visit Type: OFFICE VISIT  Date: 07/24/2023  Medication: amLODipine (NORVASC) 10 MG tablet  Mandy from [facility] requesting refill for precription.  Has the patient contacted their pharmacy? Yes   Is this the correct pharmacy for this prescription? Yes  This is the patient's preferred pharmacy:  [facility] - [place], [place] - [address] [place] [address] Phone: [phone] Fax: [phone]  Has the prescription been filled recently? No  Is the patient out of the medication? Unsure  Has the patient been seen for an appointment in the last year OR does the patient have an upcoming appointment? Yes  Can we respond through MyChart? Yes  Agent: Please be advised that Rx refills may take up to 3 business days. We ask that you follow-up with your pharmacy.

## 2023-10-12 NOTE — Telephone Encounter (Signed)
Requested Prescriptions  Pending Prescriptions Disp Refills   amLODipine (NORVASC) 10 MG tablet 90 tablet 0    Sig: Take 1 tablet (10 mg total) by mouth daily.     Cardiovascular: Calcium Channel Blockers 2 Passed - 10/12/2023 12:14 PM      Passed - Last BP in normal range    BP Readings from Last 1 Encounters:  07/24/23 136/64         Passed - Last Heart Rate in normal range    Pulse Readings from Last 1 Encounters:  07/24/23 82         Passed - Valid encounter within last 6 months    Recent Outpatient Visits           2 months ago Essential hypertension, benign   [facility] Margarita Mail, DO   6 months ago Essential hypertension, benign   [facility] Danelle Berry, PA-C   8 months ago Chronic obstructive pulmonary disease with acute exacerbation [facility])   [facility] Danelle Berry, PA-C   9 months ago Encounter for examination following treatment at hospital   [facility] Danelle Berry, PA-C   10 months ago Essential hypertension, benign   [facility] Danelle Berry, PA-C       Future Appointments             In 3 months Danelle Berry, PA-C [facility]
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
## 2023-10-23 ENCOUNTER — Ambulatory Visit (INDEPENDENT_AMBULATORY_CARE_PROVIDER_SITE_OTHER): Payer: 59 | Admitting: Vascular Surgery

## 2023-10-23 ENCOUNTER — Ambulatory Visit (INDEPENDENT_AMBULATORY_CARE_PROVIDER_SITE_OTHER): Payer: 59

## 2023-10-23 ENCOUNTER — Encounter (INDEPENDENT_AMBULATORY_CARE_PROVIDER_SITE_OTHER): Payer: Self-pay | Admitting: Vascular Surgery

## 2023-10-23 VITALS — BP 156/84 | HR 76 | Resp 18 | Ht 69.0 in | Wt 114.5 lb

## 2023-10-23 DIAGNOSIS — I25119 Atherosclerotic heart disease of native coronary artery with unspecified angina pectoris: Secondary | ICD-10-CM | POA: Diagnosis not present

## 2023-10-23 DIAGNOSIS — J411 Mucopurulent chronic bronchitis: Secondary | ICD-10-CM

## 2023-10-23 DIAGNOSIS — I70213 Atherosclerosis of native arteries of extremities with intermittent claudication, bilateral legs: Secondary | ICD-10-CM | POA: Diagnosis not present

## 2023-10-23 DIAGNOSIS — I10 Essential (primary) hypertension: Secondary | ICD-10-CM

## 2023-10-23 DIAGNOSIS — I480 Paroxysmal atrial fibrillation: Secondary | ICD-10-CM

## 2023-10-23 LAB — VAS US ABI WITH/WO TBI
Left ABI: 1.15
Right ABI: 1.12

## 2023-10-24 ENCOUNTER — Encounter (INDEPENDENT_AMBULATORY_CARE_PROVIDER_SITE_OTHER): Payer: Self-pay | Admitting: Vascular Surgery

## 2023-10-31 DIAGNOSIS — I495 Sick sinus syndrome: Secondary | ICD-10-CM | POA: Diagnosis not present

## 2023-11-07 ENCOUNTER — Other Ambulatory Visit: Payer: Self-pay | Admitting: Family Medicine

## 2023-11-07 DIAGNOSIS — I739 Peripheral vascular disease, unspecified: Secondary | ICD-10-CM

## 2023-11-07 DIAGNOSIS — I25119 Atherosclerotic heart disease of native coronary artery with unspecified angina pectoris: Secondary | ICD-10-CM

## 2024-01-24 ENCOUNTER — Ambulatory Visit (INDEPENDENT_AMBULATORY_CARE_PROVIDER_SITE_OTHER): Payer: Self-pay | Admitting: Family Medicine

## 2024-01-24 NOTE — Progress Notes (Signed)
 Name: Cynthia [name]   MRN: [id number]    DOB: [birth year]   Date:01/24/2024       Progress Note Pt no showed to appt - saving note and orders but no appt or eval done today    Subjective:   Cynthia [name] is a 68 y.o. female, presents to clinic for routine follow up on chronic conditions  Here for routine f/up Last seen in PCP office Oct 6+ months ago by Dr. Bud Care Since has been seen by vascular and cardiology (Sees psych as well but no access to records)  HTN- hx of afib, SSS BP Readings from Last 3 Encounters:  10/23/23 (!) 156/84  07/24/23 136/64  05/19/23 138/78   HLD with CAD and PAD On aspirin , plavix  statin  Lab Results  Component Value Date   CHOL 119 09/06/2022   HDL 56 09/06/2022   LDLCALC 36 09/06/2022   TRIG 87 12/19/2022   CHOLHDL 2.1 09/06/2022   CKD stage 3 Lab Results  Component Value Date   EGFR 57 (L) 02/03/2023   EGFR 45 (L) 12/06/2022   EGFR 52 (L) 09/06/2022   EGFR 54 (L) 07/08/2021   Lung disease, heavy current smoker, hx of respiratory failure many times Sees pulmonology  Neurology follows for hx of seizures and tremor      Current Outpatient Medications:    albuterol  (VENTOLIN  HFA) 108 (90 Base) MCG/ACT inhaler, INHALE 2 PUFFS INTO THE LUNGS EVERY 6 HOURS AS NEEDED FOR WHEEZING OR SHORTNESS OF BREATH, Disp: 8.5 g, Rfl: 5   amLODipine  (NORVASC ) 10 MG tablet, TAKE 1 TABLET BY MOUTH ONCE DAILY, Disp: 90 tablet, Rfl: 0   ARIPiprazole (ABILIFY) 20 MG tablet, Take 10 mg by mouth daily., Disp: , Rfl:    aspirin  EC 81 MG tablet, Take 1 tablet (81 mg total) by mouth daily. Swallow whole., Disp: 30 tablet, Rfl: 5   atorvastatin  (LIPITOR) 20 MG tablet, TAKE 1 TABLET BY MOUTH AT BEDTIME, Disp: 30 tablet, Rfl: 1   clopidogrel  (PLAVIX ) 75 MG tablet, Take 1 tablet (75 mg total) by mouth daily., Disp: 30 tablet, Rfl: 5   COVID-19 mRNA vaccine, Pfizer, (COMIRNATY ) syringe, Inject into the muscle., Disp: 0.3 mL, Rfl: 0   Fluticasone -Umeclidin-Vilant  (TRELEGY ELLIPTA ) 100-62.5-25 MCG/ACT AEPB, Inhale 1 puff into the lungs daily., Disp: 60 each, Rfl: 5   gabapentin  (NEURONTIN ) 300 MG capsule, Take 300 mg by mouth 2 (two) times daily., Disp: , Rfl:    ipratropium-albuterol  (DUONEB) 0.5-2.5 (3) MG/3ML SOLN, Take 3 mLs by nebulization 3 (three) times daily as needed (for coughing, wheeze, shortness of breath, COPD exacerbation)., Disp: 180 mL, Rfl: 1   losartan  (COZAAR ) 100 MG tablet, Take 1 tablet (100 mg total) by mouth in the morning., Disp: 30 tablet, Rfl: 5   mirtazapine  (REMERON ) 7.5 MG tablet, Take 1 tablet (7.5 mg total) by mouth at bedtime., Disp: 30 tablet, Rfl: 0   nitroGLYCERIN  (NITROSTAT ) 0.4 MG SL tablet, Place 1 tablet (0.4 mg total) under the tongue every 5 (five) minutes as needed for chest pain. Maximum of 3 pills; call 911 at first sign of chest pain, Disp: 25 tablet, Rfl: 1   Oxcarbazepine  (TRILEPTAL ) 300 MG tablet, Take 300 mg by mouth daily. (Patient not taking: Reported on 10/23/2023), Disp: , Rfl:    QUEtiapine  (SEROQUEL ) 100 MG tablet, Take 100 mg by mouth daily., Disp: , Rfl:    RSV vaccine recomb adjuvanted (AREXVY ) 120 MCG/0.5ML injection, Inject into the muscle., Disp: 0.5 mL, Rfl: 0  traZODone (DESYREL) 150 MG tablet, Take 150-300 mg by mouth at bedtime as needed., Disp: , Rfl:    triamcinolone  cream (KENALOG ) 0.1 %, Apply 1 Application topically 2 (two) times daily., Disp: , Rfl:   Patient Active Problem List   Diagnosis Date Noted   Atherosclerosis of native arteries of extremity with intermittent claudication (HCC) 04/19/2023   Atherosclerosis of native artery of both lower extremities with rest pain (HCC) 04/13/2023   Stage 3a chronic kidney disease (HCC) 04/13/2023   Tobacco use 03/03/2023   Malnutrition of moderate degree (HCC) 12/19/2022   Altered mental status 12/16/2022   Lithium  toxicity 12/16/2022   At high risk for falls 12/06/2022   Impaired mobility and ADLs 12/06/2022   Asthmatic bronchitis, severe  persistent, uncomplicated 12/06/2022   Paroxysmal A-fib (HCC) 07/22/2020   Other sequelae following unspecified cerebrovascular disease 04/28/2020   Acute hypoxic respiratory failure (HCC) 03/20/2020   Bipolar 1 disorder, mixed, full remission (HCC) 12/06/2019   PAD (peripheral artery disease) (HCC) 10/14/2019   Cannabis use, unspecified, uncomplicated 09/27/2019   Epilepsy, unspecified, not intractable, without status epilepticus (HCC) 09/27/2019   Headache, unspecified 09/27/2019   Other reduced mobility 09/27/2019   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 09/27/2019   Prsnl hx of TIA (TIA), and cereb infrc w/o resid deficits 09/27/2019   Unspecified protein-calorie malnutrition (HCC) 09/27/2019   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 09/27/2019   Bipolar I disorder, most recent episode mixed (HCC) 07/22/2019   Cirrhosis of liver (HCC) 11/26/2018   Lung nodule, multiple 11/26/2018   Mixed hyperlipidemia 04/04/2018   Bilateral leg weakness 11/14/2016   Memory impairment 09/23/2016   Hearing loss in left ear 06/24/2016   Chronic hip pain 03/17/2016   HSV infection 01/05/2016   Cervical dysplasia, mild 01/05/2016   Coronary artery disease 11/10/2015   Low grade squamous intraepithelial lesion (LGSIL) on Papanicolaou smear of cervix 10/25/2015   Generalized anxiety disorder 10/19/2015   COPD (chronic obstructive pulmonary disease) (HCC) 09/21/2015   Essential hypertension, benign 09/17/2015   Hx of tobacco use, presenting hazards to health 09/17/2015   Traumatic brain injury (HCC) 09/17/2015   Neuropathy 09/17/2015   Abnormality of gait and mobility 09/17/2015   Second degree heart block 09/17/2015   Hx of hepatitis C 05/04/2012   Bipolar affective disorder (HCC) 04/02/2011    Past Surgical History:  Procedure Laterality Date   COLONOSCOPY  07/31/13   done at [facility], Dr. Rachel Budds   LOWER EXTREMITY ANGIOGRAPHY Left 10/29/2019   Procedure: LOWER EXTREMITY  ANGIOGRAPHY;  Surgeon: Jackquelyn Mass, MD;  Location: [facility] INVASIVE CV LAB;  Service: Cardiovascular;  Laterality: Left;   PACEMAKER PLACEMENT  07/2009   PPM GENERATOR CHANGEOUT N/A 04/19/2022   Procedure: PPM GENERATOR CHANGEOUT;  Surgeon: Percival Brace, MD;  Location: [facility] INVASIVE CV LAB;  Service: Cardiovascular;  Laterality: N/A;   TUBAL LIGATION      Family History  Problem Relation Age of Onset   Heart disease Mother    Hypertension Mother    Cancer Mother        breast   Anxiety disorder Mother    Breast cancer Mother 3   Cancer Father        multiple myeloma   Hypertension Father    Alcohol abuse Father    Mental illness Sister    Schizophrenia Sister    Cancer Sister        breast   Alcohol abuse Brother    Drug abuse  Brother    Bipolar disorder Brother    Alcohol abuse Sister    Drug abuse Sister    Bipolar disorder Sister    Cancer Maternal Aunt    Heart disease Maternal Aunt    Stroke Maternal Aunt    Heart disease Maternal Grandmother    Hypertension Maternal Grandmother    Hypertension Maternal Grandfather    Hypertension Paternal Grandmother    Cancer Paternal Grandfather    Hypertension Paternal Grandfather    Stroke Paternal Grandfather    COPD Neg Hx    Diabetes Neg Hx     Social History   Tobacco Use   Smoking status: Every Day    Current packs/day: 1.00    Average packs/day: 2.9 packs/day for 52.8 years (150.8 ttl pk-yrs)    Types: Cigarettes    Start date: 03/21/1971    Last attempt to quit: 03/20/2020   Smokeless tobacco: Never   Tobacco comments:    1PPD 04/05/2023 - not sure when she restarted - she has been smoking at max 1 ppd for the past 4 years that I've known her and prior to that 1ppd     1PPD- 05/19/2023 khj  Vaping Use   Vaping status: Never Used  Substance Use Topics   Alcohol use: Not Currently    Comment: Occassional    Drug use: Yes    Types: Marijuana    Comment: reports smokes every night "if i got it":  last used 7/24 PM     Allergies  Allergen Reactions   Morphine Anaphylaxis    Cardiac arrest    Health Maintenance  Topic Date Due   DEXA SCAN  Never done   Zoster Vaccines- Shingrix (2 of 2) 01/04/2022   COVID-19 Vaccine (5 - 2024-25 season) 01/22/2024   DTaP/Tdap/Td (2 - Td or Tdap) 07/23/2024 (Originally 04/11/2023)   Lung Cancer Screening  04/24/2024   INFLUENZA VACCINE  04/26/2024   Medicare Annual Wellness (AWV)  04/27/2024   MAMMOGRAM  05/07/2024   Colonoscopy  12/15/2025   Pneumonia Vaccine 47+ Years old  Completed   Hepatitis C Screening  Completed   HPV VACCINES  Aged Out   Meningococcal B Vaccine  Aged Out    Chart Review Today:  Review of Systems   Objective:   There were no vitals filed for this visit.  There is no height or weight on file to calculate BMI.  Physical Exam   Functional Status Survey:   Results for orders placed or performed in visit on 10/23/23  VAS US  ABI WITH/WO TBI   Collection Time: 10/23/23 10:42 AM  Result Value Ref Range   Right ABI 1.12    Left ABI 1.15       Assessment & Plan:   No-show for appointment  Coronary artery disease involving native coronary artery of native heart with angina pectoris (HCC) -     Comprehensive metabolic panel with GFR; Future -     Lipid panel; Future  PAD (peripheral artery disease) (HCC) -     Comprehensive metabolic panel with GFR; Future -     Lipid panel; Future  Impaired mobility and ADLs  Mixed hyperlipidemia -     Comprehensive metabolic panel with GFR; Future -     Lipid panel; Future  Neuropathy  Essential hypertension, benign -     Comprehensive metabolic panel with GFR; Future  Mucopurulent chronic bronchitis (HCC)  Paroxysmal A-fib (HCC)  Nonintractable epilepsy without status epilepticus, unspecified epilepsy type (HCC)  Stage 3a chronic kidney disease (HCC) -     Comprehensive metabolic panel with GFR; Future  Encounter for monitoring antiplatelet  therapy -     CBC with Differential/Platelet; Future -     Comprehensive metabolic panel with GFR; Future     No follow-ups on file.   Adeline Hone, PA-C 01/24/24 10:30 AM

## 2024-02-01 ENCOUNTER — Other Ambulatory Visit: Payer: Self-pay | Admitting: Family Medicine

## 2024-02-01 DIAGNOSIS — I739 Peripheral vascular disease, unspecified: Secondary | ICD-10-CM

## 2024-02-01 DIAGNOSIS — I25119 Atherosclerotic heart disease of native coronary artery with unspecified angina pectoris: Secondary | ICD-10-CM

## 2024-02-02 ENCOUNTER — Ambulatory Visit: Admitting: Family Medicine

## 2024-02-05 ENCOUNTER — Ambulatory Visit (INDEPENDENT_AMBULATORY_CARE_PROVIDER_SITE_OTHER): Admitting: Family Medicine

## 2024-02-05 ENCOUNTER — Encounter: Payer: Self-pay | Admitting: Family Medicine

## 2024-02-05 VITALS — BP 134/76 | HR 87 | Resp 16 | Ht 69.0 in | Wt 115.0 lb

## 2024-02-05 DIAGNOSIS — Z9189 Other specified personal risk factors, not elsewhere classified: Secondary | ICD-10-CM

## 2024-02-05 DIAGNOSIS — I739 Peripheral vascular disease, unspecified: Secondary | ICD-10-CM | POA: Diagnosis not present

## 2024-02-05 DIAGNOSIS — N1831 Chronic kidney disease, stage 3a: Secondary | ICD-10-CM

## 2024-02-05 DIAGNOSIS — I25119 Atherosclerotic heart disease of native coronary artery with unspecified angina pectoris: Secondary | ICD-10-CM | POA: Diagnosis not present

## 2024-02-05 DIAGNOSIS — Z87891 Personal history of nicotine dependence: Secondary | ICD-10-CM | POA: Diagnosis not present

## 2024-02-05 DIAGNOSIS — J411 Mucopurulent chronic bronchitis: Secondary | ICD-10-CM | POA: Diagnosis not present

## 2024-02-05 DIAGNOSIS — K7460 Unspecified cirrhosis of liver: Secondary | ICD-10-CM

## 2024-02-05 DIAGNOSIS — E782 Mixed hyperlipidemia: Secondary | ICD-10-CM | POA: Diagnosis not present

## 2024-02-05 DIAGNOSIS — Z7902 Long term (current) use of antithrombotics/antiplatelets: Secondary | ICD-10-CM | POA: Diagnosis not present

## 2024-02-05 DIAGNOSIS — Z5181 Encounter for therapeutic drug level monitoring: Secondary | ICD-10-CM

## 2024-02-05 DIAGNOSIS — I10 Essential (primary) hypertension: Secondary | ICD-10-CM

## 2024-02-05 DIAGNOSIS — F3160 Bipolar disorder, current episode mixed, unspecified: Secondary | ICD-10-CM

## 2024-02-05 DIAGNOSIS — G40909 Epilepsy, unspecified, not intractable, without status epilepticus: Secondary | ICD-10-CM

## 2024-02-05 NOTE — Patient Instructions (Addendum)
 Team Member Role and Specialty Contact Info Address  PCPs     [facility] (Family Medicine) Phone: [phone] Fax: [phone] [address] [place] [address]  Additional Team Members     Schnier, Ninette Basque, MD (Vascular Surgery) Phone: [phone] Fax: [phone] [address] [place] [address]  Antonette Batters, MD Consulting Physician (Cardiology) Phone: [phone] Fax: [phone] [name] [place] [address]  Zenobia Hila, MD Consulting Physician (Obstetrics and Gynecology) Phone: [phone] Fax: [phone] [address] [place] [address]  Enos Harts, MD Consulting Physician (Pulmonary Disease) Phone: [phone] Fax: [phone] [address] [place] [address]  Bufford Carne, MD Referring Physician (Neurology) Phone: [phone] Fax: [phone] [address] [facility] [place] [address]

## 2024-02-05 NOTE — Assessment & Plan Note (Signed)
 Managed on norvasc  and losartan , pt and spouse endorse good med compliance According to med dispense med req history on chart she may not be taking both regularly - no losartan  refill for this year? BP near goal BP Readings from Last 3 Encounters:  02/05/24 134/76  10/23/23 (!) 156/84  07/24/23 136/64

## 2024-02-05 NOTE — Assessment & Plan Note (Signed)
 Previously managed by specialist but no longer seeing GI, not on meds for cirrhosis or fluid overload. LFTs have been normal Last ammonia elevation was in 2021 with hospital encounter

## 2024-02-05 NOTE — Assessment & Plan Note (Signed)
 Managed by [facility] - still on trileptal , remeron , seroquel , abilify and trazodone - not sure if all meds are being used at bedtime (remeron , seroquel , trazodone) concern for polypharmacy Pt endorses feeling hyperactive, manic

## 2024-02-05 NOTE — Assessment & Plan Note (Signed)
 Seeing vascular annually, last OV Jan reviewed  No changes made continued DAPT and statin

## 2024-02-05 NOTE — Assessment & Plan Note (Signed)
 Due for lipid panel  On atorvastatin , reports good med compliance and no SE or concerns

## 2024-02-05 NOTE — Assessment & Plan Note (Signed)
 On statin and DAPT, seeing cardiology

## 2024-02-05 NOTE — Progress Notes (Signed)
 Name: Cynthia [name]   MRN: [id number]    DOB: 1955-11-15   Date:02/05/2024       Progress Note  Chief Complaint  Patient presents with   Medical Management of Chronic Issues     Subjective:   Cynthia [name] is a 68 y.o. female, presents to clinic for routine follow up on chronic conditions  Here for routine f/up Last seen in PCP office Oct 6+ months ago by Dr. Bud Care Since has been seen by vascular and cardiology (Sees psych as well but no access to records)  HTN- hx of afib, SSS, seeing cardiology Losartan  and amlodipine  daily BP Readings from Last 3 Encounters:  02/05/24 134/76  10/23/23 (!) 156/84  07/24/23 136/64   HLD with CAD and PAD- did f/up with vascular in Jan On aspirin , plavix  statin  Lab Results  Component Value Date   CHOL 119 09/06/2022   HDL 56 09/06/2022   LDLCALC 36 09/06/2022   TRIG 87 12/19/2022   CHOLHDL 2.1 09/06/2022   CKD stage 3 Lab Results  Component Value Date   EGFR 57 (L) 02/03/2023   EGFR 45 (L) 12/06/2022   EGFR 52 (L) 09/06/2022   EGFR 54 (L) 07/08/2021   Lung disease, heavy current smoker, hx of respiratory failure many times Sees pulmonology-  COPD (chronic obstructive pulmonary disease) (HCC) Severe COPD with moderate symptom burden.  She is compensated on current regimen.  Strongly encouraged to quit smoking.  She is currently working on cutting back.  She will continue triple therapy and as needed Saba.  Action plan in place.  Small frequent, high-protein meals encouraged.   Patient Instructions  Continue Albuterol  inhaler 2 puffs or duoneb 3 mL neb every 6 hours as needed for shortness of breath or wheezing. Notify if symptoms persist despite rescue inhaler/neb use.  Continue Trelegy 1 puff daily. Brush tongue and rinse mouth afterwards     Repeat CT chest 04/2024    Follow up in 4 months with one of the new pulmonologists. If symptoms do not improve or worsen, please contact office for sooner follow up or seek emergency  care.   Neurology follows for hx of seizures and tremor  Psych disease managed by psych - multiple med changes over the past couple years  In the past couple months she f/up with cardiology and vascular  Psych records not available Last routine f/up visit in office was about 7 months ago with Dr. Bud Care   Smoking - still wants to stop smoking but has trouble with this, she says she's cutting back on amount  1 ppd - spreadign throughout the day, she was smoking them faster or more in short amount of time  She is vaping -  Wt Readings from Last 5 Encounters:  02/05/24 115 lb (52.2 kg)  10/23/23 114 lb 8 oz (51.9 kg)  07/24/23 120 lb 11.2 oz (54.7 kg)  05/19/23 119 lb (54 kg)  04/28/23 118 lb 14.4 oz (53.9 kg)   BMI Readings from Last 5 Encounters:  02/05/24 16.98 kg/m  10/23/23 16.91 kg/m  07/24/23 20.09 kg/m  05/19/23 19.21 kg/m  04/28/23 19.19 kg/m   Breathing getting SOB with exertion, she is doing     Current Outpatient Medications:    albuterol  (VENTOLIN  HFA) 108 (90 Base) MCG/ACT inhaler, INHALE 2 PUFFS INTO THE LUNGS EVERY 6 HOURS AS NEEDED FOR WHEEZING OR SHORTNESS OF BREATH, Disp: 8.5 g, Rfl: 5   amLODipine  (NORVASC ) 10 MG tablet, TAKE 1 TABLET BY MOUTH  ONCE DAILY, Disp: 90 tablet, Rfl: 0   ARIPiprazole (ABILIFY) 20 MG tablet, Take 10 mg by mouth daily., Disp: , Rfl:    aspirin  EC 81 MG tablet, Take 1 tablet (81 mg total) by mouth daily. Swallow whole., Disp: 30 tablet, Rfl: 5   atorvastatin  (LIPITOR) 20 MG tablet, TAKE 1 TABLET BY MOUTH AT BEDTIME, Disp: 90 tablet, Rfl: 1   clopidogrel  (PLAVIX ) 75 MG tablet, Take 1 tablet (75 mg total) by mouth daily., Disp: 30 tablet, Rfl: 5   Fluticasone -Umeclidin-Vilant (TRELEGY ELLIPTA ) 100-62.5-25 MCG/ACT AEPB, Inhale 1 puff into the lungs daily., Disp: 60 each, Rfl: 5   gabapentin  (NEURONTIN ) 300 MG capsule, Take 300 mg by mouth 2 (two) times daily., Disp: , Rfl:    ipratropium-albuterol  (DUONEB) 0.5-2.5 (3) MG/3ML SOLN,  Take 3 mLs by nebulization 3 (three) times daily as needed (for coughing, wheeze, shortness of breath, COPD exacerbation)., Disp: 180 mL, Rfl: 1   losartan  (COZAAR ) 100 MG tablet, Take 1 tablet (100 mg total) by mouth in the morning., Disp: 30 tablet, Rfl: 5   mirtazapine  (REMERON ) 7.5 MG tablet, Take 1 tablet (7.5 mg total) by mouth at bedtime., Disp: 30 tablet, Rfl: 0   nitroGLYCERIN  (NITROSTAT ) 0.4 MG SL tablet, Place 1 tablet (0.4 mg total) under the tongue every 5 (five) minutes as needed for chest pain. Maximum of 3 pills; call 911 at first sign of chest pain, Disp: 25 tablet, Rfl: 1   Oxcarbazepine  (TRILEPTAL ) 300 MG tablet, Take 300 mg by mouth daily., Disp: , Rfl:    QUEtiapine  (SEROQUEL ) 100 MG tablet, Take 100 mg by mouth daily., Disp: , Rfl:    traZODone (DESYREL) 150 MG tablet, Take 150-300 mg by mouth at bedtime as needed., Disp: , Rfl:    triamcinolone  cream (KENALOG ) 0.1 %, Apply 1 Application topically 2 (two) times daily., Disp: , Rfl:   Patient Active Problem List   Diagnosis Date Noted   Atherosclerosis of native arteries of extremity with intermittent claudication (HCC) 04/19/2023   Atherosclerosis of native artery of both lower extremities with rest pain (HCC) 04/13/2023   Stage 3a chronic kidney disease (HCC) 04/13/2023   Tobacco use 03/03/2023   Malnutrition of moderate degree (HCC) 12/19/2022   Altered mental status 12/16/2022   Lithium  toxicity 12/16/2022   At high risk for falls 12/06/2022   Impaired mobility and ADLs 12/06/2022   Asthmatic bronchitis, severe persistent, uncomplicated 12/06/2022   Paroxysmal A-fib (HCC) 07/22/2020   Other sequelae following unspecified cerebrovascular disease 04/28/2020   Acute hypoxic respiratory failure (HCC) 03/20/2020   Bipolar 1 disorder, mixed, full remission (HCC) 12/06/2019   PAD (peripheral artery disease) (HCC) 10/14/2019   Cannabis use, unspecified, uncomplicated 09/27/2019   Epilepsy, unspecified, not intractable,  without status epilepticus (HCC) 09/27/2019   Headache, unspecified 09/27/2019   Other reduced mobility 09/27/2019   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 09/27/2019   Prsnl hx of TIA (TIA), and cereb infrc w/o resid deficits 09/27/2019   Unspecified protein-calorie malnutrition (HCC) 09/27/2019   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 09/27/2019   Bipolar I disorder, most recent episode mixed (HCC) 07/22/2019   Cirrhosis of liver (HCC) 11/26/2018   Lung nodule, multiple 11/26/2018   Mixed hyperlipidemia 04/04/2018   Bilateral leg weakness 11/14/2016   Memory impairment 09/23/2016   Hearing loss in left ear 06/24/2016   Chronic hip pain 03/17/2016   HSV infection 01/05/2016   Cervical dysplasia, mild 01/05/2016   Coronary artery disease 11/10/2015  Low grade squamous intraepithelial lesion (LGSIL) on Papanicolaou smear of cervix 10/25/2015   Generalized anxiety disorder 10/19/2015   COPD (chronic obstructive pulmonary disease) (HCC) 09/21/2015   Essential hypertension, benign 09/17/2015   Hx of tobacco use, presenting hazards to health 09/17/2015   Traumatic brain injury (HCC) 09/17/2015   Neuropathy 09/17/2015   Abnormality of gait and mobility 09/17/2015   Second degree heart block 09/17/2015   Hx of hepatitis C 05/04/2012   Bipolar affective disorder (HCC) 04/02/2011    Past Surgical History:  Procedure Laterality Date   COLONOSCOPY  07/31/13   done at [facility], Dr. Rachel Budds   LOWER EXTREMITY ANGIOGRAPHY Left 10/29/2019   Procedure: LOWER EXTREMITY ANGIOGRAPHY;  Surgeon: Jackquelyn Mass, MD;  Location: [facility] INVASIVE CV LAB;  Service: Cardiovascular;  Laterality: Left;   PACEMAKER PLACEMENT  07/2009   PPM GENERATOR CHANGEOUT N/A 04/19/2022   Procedure: PPM GENERATOR CHANGEOUT;  Surgeon: Percival Brace, MD;  Location: [facility] INVASIVE CV LAB;  Service: Cardiovascular;  Laterality: N/A;   TUBAL LIGATION      Family History  Problem Relation Age of  Onset   Heart disease Mother    Hypertension Mother    Cancer Mother        breast   Anxiety disorder Mother    Breast cancer Mother 11   Cancer Father        multiple myeloma   Hypertension Father    Alcohol abuse Father    Mental illness Sister    Schizophrenia Sister    Cancer Sister        breast   Alcohol abuse Brother    Drug abuse Brother    Bipolar disorder Brother    Alcohol abuse Sister    Drug abuse Sister    Bipolar disorder Sister    Cancer Maternal Aunt    Heart disease Maternal Aunt    Stroke Maternal Aunt    Heart disease Maternal Grandmother    Hypertension Maternal Grandmother    Hypertension Maternal Grandfather    Hypertension Paternal Grandmother    Cancer Paternal Grandfather    Hypertension Paternal Grandfather    Stroke Paternal Grandfather    COPD Neg Hx    Diabetes Neg Hx     Social History   Tobacco Use   Smoking status: Every Day    Current packs/day: 1.00    Average packs/day: 2.9 packs/day for 52.9 years (150.9 ttl pk-yrs)    Types: Cigarettes    Start date: 03/21/1971    Last attempt to quit: 03/20/2020   Smokeless tobacco: Never   Tobacco comments:    1PPD 04/05/2023 - not sure when she restarted - she has been smoking at max 1 ppd for the past 4 years that I've known her and prior to that 1ppd     1PPD- 05/19/2023 khj  Vaping Use   Vaping status: Never Used  Substance Use Topics   Alcohol use: Not Currently    Comment: Occassional    Drug use: Yes    Types: Marijuana    Comment: reports smokes every night "if i got it": last used 7/24 PM     Allergies  Allergen Reactions   Morphine Anaphylaxis    Cardiac arrest    Health Maintenance  Topic Date Due   DEXA SCAN  Never done   COVID-19 Vaccine (5 - 2024-25 season) 01/22/2024   Zoster Vaccines- Shingrix (2 of 2) 05/07/2024 (Originally 01/04/2022)   DTaP/Tdap/Td (2 - Td or Tdap) 07/23/2024 (  Originally 04/11/2023)   Lung Cancer Screening  04/24/2024   INFLUENZA VACCINE   04/26/2024   Medicare Annual Wellness (AWV)  04/27/2024   MAMMOGRAM  05/07/2024   Colonoscopy  12/15/2025   Pneumonia Vaccine 82+ Years old  Completed   Hepatitis C Screening  Completed   HPV VACCINES  Aged Out   Meningococcal B Vaccine  Aged Out    Chart Review Today:  Review of Systems  All other systems reviewed and are negative.    Objective:   Vitals:   02/05/24 0857  BP: 134/76  Pulse: 87  Resp: 16  SpO2: 97%  Weight: 115 lb (52.2 kg)  Height: 5\' 9"  (1.753 m)    Body mass index is 16.98 kg/m.  Physical Exam Vitals and nursing note reviewed.  Constitutional:      General: She is not in acute distress.    Appearance: She is underweight. She is not ill-appearing, toxic-appearing or diaphoretic.  HENT:     Head: Normocephalic and atraumatic.     Right Ear: External ear normal.     Left Ear: External ear normal.     Mouth/Throat:     Mouth: Mucous membranes are moist.     Pharynx: Oropharynx is clear.  Eyes:     General: No scleral icterus.       Right eye: No discharge.        Left eye: No discharge.     Conjunctiva/sclera: Conjunctivae normal.  Cardiovascular:     Rate and Rhythm: Normal rate and regular rhythm.     Pulses: Normal pulses.     Heart sounds: Normal heart sounds.  Pulmonary:     Effort: Pulmonary effort is normal. No tachypnea, accessory muscle usage or respiratory distress.     Breath sounds: No stridor. Wheezing and rhonchi present. No rales.  Abdominal:     General: Bowel sounds are normal.     Palpations: Abdomen is soft.  Skin:    Findings: Bruising present. No rash.  Neurological:     Mental Status: She is alert. Mental status is at baseline.     Gait: Gait abnormal.  Psychiatric:        Attention and Perception: She is inattentive.        Mood and Affect: Mood is not anxious or depressed.        Behavior: Behavior is cooperative.     Comments: Pleasant, more talkative/energetic        Results for orders placed or  performed in visit on 10/23/23  VAS US  ABI WITH/WO TBI   Collection Time: 10/23/23 10:42 AM  Result Value Ref Range   Right ABI 1.12    Left ABI 1.15       Assessment & Plan:   Essential hypertension, benign Assessment & Plan: Managed on norvasc  and losartan , pt and spouse endorse good med compliance According to med dispense med req history on chart she may not be taking both regularly - no losartan  refill for this year? BP near goal BP Readings from Last 3 Encounters:  02/05/24 134/76  10/23/23 (!) 156/84  07/24/23 136/64     Orders: -     Comprehensive metabolic panel with GFR  Stage 3a chronic kidney disease (HCC) Assessment & Plan: Recheck labs today, last eGFR was 57  Orders: -     Comprehensive metabolic panel with GFR  Coronary artery disease involving native coronary artery of native heart with angina pectoris [facility]) Assessment & Plan: On statin and DAPT,  seeing cardiology  Orders: -     Comprehensive metabolic panel with GFR -     Lipid panel  PAD (peripheral artery disease) (HCC) Assessment & Plan: Seeing vascular annually, last OV Jan reviewed  No changes made continued DAPT and statin  Orders: -     Comprehensive metabolic panel with GFR -     Lipid panel  Mixed hyperlipidemia Assessment & Plan: Due for lipid panel  On atorvastatin , reports good med compliance and no SE or concerns  Orders: -     Comprehensive metabolic panel with GFR -     Lipid panel  Hx of tobacco use, presenting hazards to health Assessment & Plan: Trying to cut back, but she is still smoking 1 ppd and has added vaping - though they are not sure what is in the vape   Bipolar affective disorder, current episode mixed, current episode severity unspecified (HCC) Assessment & Plan: [facility] - still on trileptal , remeron , seroquel , abilify and trazodone - not sure if all meds are being used at bedtime (remeron , seroquel , trazodone) concern for  polypharmacy Pt endorses feeling hyperactive, manic   Mucopurulent chronic bronchitis (HCC) Assessment & Plan: Lost to f/up with Pulm, had OV in August 2024 with PFTs which showed worsening function and severe COPD with recommended 4 month f/up but no appts since - referred back and pt and spouse given info for specialists   Orders: -     Pulmonary Visit  Cirrhosis of liver without ascites, unspecified hepatic cirrhosis type (HCC) Assessment & Plan: Previously managed by specialist but no longer seeing GI, not on meds for cirrhosis or fluid overload. LFTs have been normal Last ammonia elevation was in 2021 with hospital encounter   Nonintractable epilepsy without status epilepticus, unspecified epilepsy type [facility]) Assessment & Plan: Per [facility]   Encounter for monitoring antiplatelet therapy -     CBC with Differential/Platelet -     Comprehensive metabolic panel with GFR  At risk for polypharmacy - unclear if she is taking all meds from psych together - trazodone, remeron  and seroquel ?      Return in about 4 months (around 06/07/2024) for Routine follow-up (HTN, COPD, weight).   Adeline Hone, PA-C 02/05/24 9:13 AM

## 2024-02-05 NOTE — Assessment & Plan Note (Signed)
 Per [facility] Neurology

## 2024-02-05 NOTE — Telephone Encounter (Signed)
 Requested medications are due for refill today.  yes  Requested medications are on the active medications list.  yes  Last refill. 11/07/2023 #30 1 rf  Future visit scheduled.   today  Notes to clinic.  Labs are expired.    Requested Prescriptions  Pending Prescriptions Disp Refills   atorvastatin  (LIPITOR) 20 MG tablet [Pharmacy Med Name: ATORVASTATIN  CALCIUM  20 MG TAB] 30 tablet 1    Sig: TAKE 1 TABLET BY MOUTH AT BEDTIME     Cardiovascular:  Antilipid - Statins Failed - 02/05/2024  7:36 AM      Failed - Valid encounter within last 12 months    Recent Outpatient Visits           1 week ago No-show for appointment   [facility] Adeline Hone, [place]       Future Appointments             Today Tapia, Leisa, PA-C [facility]            Failed - Lipid Panel in normal range within the last 12 months    Cholesterol, Total  Date Value Ref Range Status  09/17/2015 228 (H) 100 - 199 mg/dL Final   Cholesterol  Date Value Ref Range Status  09/06/2022 119 <200 mg/dL Final   LDL Cholesterol (Calc)  Date Value Ref Range Status  09/06/2022 36 mg/dL (calc) Final    Comment:    Reference range: <100 . Desirable range <100 mg/dL for primary prevention;   <70 mg/dL for patients with CHD or diabetic patients  with > or = 2 CHD risk factors. Aaron Aas LDL-C is now calculated using the Martin-Hopkins  calculation, which is a validated novel method providing  better accuracy than the Friedewald equation in the  estimation of LDL-C.  Melinda Sprawls et al. Erroll Heard. 1610;960(45): 2061-2068  (http://education.[employer].com/faq/FAQ164)    HDL  Date Value Ref Range Status  09/06/2022 56 > OR = 50 mg/dL Final  40/98/1191 74 >47 mg/dL Final   Triglycerides  Date Value Ref Range Status  12/19/2022 87 <150 mg/dL Final    Comment:    Performed at [facility], [address], [place], [place] [address]         Passed  - Patient is not pregnant

## 2024-02-05 NOTE — Assessment & Plan Note (Signed)
 Recheck labs today, last eGFR was 57

## 2024-02-05 NOTE — Assessment & Plan Note (Signed)
 Lost to f/up with Pulm, had OV in August 2024 with PFTs which showed worsening function and severe COPD with recommended 4 month f/up but no appts since - referred back and pt and spouse given info for specialists

## 2024-02-05 NOTE — Assessment & Plan Note (Signed)
 Trying to cut back, but she is still smoking 1 ppd and has added vaping - though they are not sure what is in the vape

## 2024-02-06 ENCOUNTER — Ambulatory Visit: Payer: Self-pay | Admitting: Family Medicine

## 2024-02-06 DIAGNOSIS — N1831 Chronic kidney disease, stage 3a: Secondary | ICD-10-CM

## 2024-02-06 DIAGNOSIS — I10 Essential (primary) hypertension: Secondary | ICD-10-CM

## 2024-02-06 DIAGNOSIS — I739 Peripheral vascular disease, unspecified: Secondary | ICD-10-CM

## 2024-02-06 LAB — COMPREHENSIVE METABOLIC PANEL WITH GFR
AG Ratio: 1.3 (calc) (ref 1.0–2.5)
ALT: 8 U/L (ref 6–29)
AST: 12 U/L (ref 10–35)
Albumin: 4.4 g/dL (ref 3.6–5.1)
Alkaline phosphatase (APISO): 70 U/L (ref 37–153)
BUN/Creatinine Ratio: 17 (calc) (ref 6–22)
BUN: 19 mg/dL (ref 7–25)
CO2: 24 mmol/L (ref 20–32)
Calcium: 10 mg/dL (ref 8.6–10.4)
Chloride: 104 mmol/L (ref 98–110)
Creat: 1.15 mg/dL — ABNORMAL HIGH (ref 0.50–1.05)
Globulin: 3.3 g/dL (ref 1.9–3.7)
Glucose, Bld: 98 mg/dL (ref 65–99)
Potassium: 4.5 mmol/L (ref 3.5–5.3)
Sodium: 137 mmol/L (ref 135–146)
Total Bilirubin: 0.5 mg/dL (ref 0.2–1.2)
Total Protein: 7.7 g/dL (ref 6.1–8.1)
eGFR: 52 mL/min/{1.73_m2} — ABNORMAL LOW (ref >=60)

## 2024-02-06 LAB — CBC WITH DIFFERENTIAL/PLATELET
Absolute Lymphocytes: 1872 {cells}/uL (ref 850–3900)
Absolute Monocytes: 451 {cells}/uL (ref 200–950)
Basophils Absolute: 111 {cells}/uL (ref 0–200)
Basophils Relative: 1.5 %
Eosinophils Absolute: 74 {cells}/uL (ref 15–500)
Eosinophils Relative: 1 %
HCT: 43.7 % (ref 35.0–45.0)
Hemoglobin: 14.4 g/dL (ref 11.7–15.5)
MCH: 29 pg (ref 27.0–33.0)
MCHC: 33 g/dL (ref 32.0–36.0)
MCV: 87.9 fL (ref 80.0–100.0)
MPV: 10.3 fL (ref 7.5–12.5)
Monocytes Relative: 6.1 %
Neutro Abs: 4891 {cells}/uL (ref 1500–7800)
Neutrophils Relative %: 66.1 %
Platelets: 200 10*3/uL (ref 140–400)
RBC: 4.97 10*6/uL (ref 3.80–5.10)
RDW: 14.1 % (ref 11.0–15.0)
Total Lymphocyte: 25.3 %
WBC: 7.4 10*3/uL (ref 3.8–10.8)

## 2024-02-06 LAB — LIPID PANEL
Cholesterol: 146 mg/dL (ref <200)
HDL: 57 mg/dL (ref >=50)
LDL Cholesterol (Calc): 71 mg/dL
Non-HDL Cholesterol (Calc): 89 mg/dL (ref <130)
Total CHOL/HDL Ratio: 2.6 (calc) (ref <5.0)
Triglycerides: 97 mg/dL (ref <150)

## 2024-02-06 MED ORDER — LOSARTAN POTASSIUM 100 MG PO TABS: 100.0000 mg | ORAL_TABLET | Freq: Every morning | ORAL | 1 refills | Status: DC

## 2024-02-06 MED ORDER — CLOPIDOGREL BISULFATE 75 MG PO TABS: 75.0000 mg | ORAL_TABLET | Freq: Every day | ORAL | 1 refills | Status: DC

## 2024-02-06 MED ORDER — AMLODIPINE BESYLATE 10 MG PO TABS: 10.0000 mg | ORAL_TABLET | Freq: Every day | ORAL | 1 refills | Status: DC

## 2024-02-13 ENCOUNTER — Encounter (INDEPENDENT_AMBULATORY_CARE_PROVIDER_SITE_OTHER): Payer: Self-pay

## 2024-02-15 ENCOUNTER — Other Ambulatory Visit: Payer: Self-pay | Admitting: Family Medicine

## 2024-02-15 DIAGNOSIS — J441 Chronic obstructive pulmonary disease with (acute) exacerbation: Secondary | ICD-10-CM

## 2024-02-16 NOTE — Telephone Encounter (Signed)
 Requested Prescriptions  Pending Prescriptions Disp Refills   Fluticasone -Umeclidin-Vilant (TRELEGY ELLIPTA ) 100-62.5-25 MCG/ACT AEPB [Pharmacy Med Name: TRELEGY ELLIPTA  100-62.5-25 MCG/ACT] 60 each 3    Sig: USE ONE INHALATION INTO THE LUNGS ONCE A DAY RINSE MOUTH AFTER EACH USE     Off-Protocol Failed - 02/16/2024  1:49 PM      Failed - Medication not assigned to a protocol, review manually.      Passed - Valid encounter within last 12 months    Recent Outpatient Visits           1 week ago Essential hypertension, benign   [facility] Adeline Hone, PA-C   3 weeks ago No-show for appointment   [facility] Adeline Hone, PA-C

## 2024-04-08 ENCOUNTER — Encounter: Admitting: Student in an Organized Health Care Education/Training Program

## 2024-04-30 ENCOUNTER — Other Ambulatory Visit (HOSPITAL_COMMUNITY): Payer: Self-pay

## 2024-05-08 ENCOUNTER — Other Ambulatory Visit: Payer: Self-pay | Admitting: Family Medicine

## 2024-05-08 DIAGNOSIS — I25119 Atherosclerotic heart disease of native coronary artery with unspecified angina pectoris: Secondary | ICD-10-CM

## 2024-05-08 DIAGNOSIS — I739 Peripheral vascular disease, unspecified: Secondary | ICD-10-CM

## 2024-05-09 ENCOUNTER — Other Ambulatory Visit: Payer: Self-pay

## 2024-05-09 ENCOUNTER — Ambulatory Visit (INDEPENDENT_AMBULATORY_CARE_PROVIDER_SITE_OTHER): Payer: 59

## 2024-05-09 DIAGNOSIS — Z87891 Personal history of nicotine dependence: Secondary | ICD-10-CM

## 2024-05-09 DIAGNOSIS — Z780 Asymptomatic menopausal state: Secondary | ICD-10-CM

## 2024-05-09 DIAGNOSIS — Z122 Encounter for screening for malignant neoplasm of respiratory organs: Secondary | ICD-10-CM

## 2024-05-09 DIAGNOSIS — Z1231 Encounter for screening mammogram for malignant neoplasm of breast: Secondary | ICD-10-CM

## 2024-05-09 DIAGNOSIS — Z0000 Encounter for general adult medical examination without abnormal findings: Secondary | ICD-10-CM

## 2024-05-09 DIAGNOSIS — F17210 Nicotine dependence, cigarettes, uncomplicated: Secondary | ICD-10-CM
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
## 2024-05-09 NOTE — Patient Instructions (Signed)
 Cynthia [name] , Thank you for taking time out of your busy schedule to complete your Annual Wellness Visit with me. I enjoyed our conversation and look forward to speaking with you again next year. I, as well as your care team,  appreciate your ongoing commitment to your health goals. Please review the following plan we discussed and let me know if I can assist you in the future.  REFERRALS SENT FOR MAMMOGRAM, BONE DENSITY & LUNG CA SCREENING You have an order for:  []   2D Mammogram  [x]   3D Mammogram  [x]   Bone Density     Please call for appointment:  [facility]  [address] [place] [address] [phone] [facility] [address] [place], [place] [address] [phone] [facility] [address] [place], [place] [address] [phone]   Make sure to wear two-piece clothing.  No lotions, powders, or deodorants the day of the appointment. Make sure to bring picture ID and insurance card.  Bring list of medications you are currently taking including any supplements.   Schedule your [place] screening mammogram through MyChart!   Log into your MyChart account.  Go to 'Visit' (or 'Appointments' if on mobile App) --> Schedule an Appointment  Under 'Select a Reason for Visit' choose the Mammogram Screening option.  Complete the pre-visit questions and select the time and place that best fits your schedule.    Follow up Visits: 05/15/25 @ 10:50 AM BY PHONE We will see or speak with you next year for your Next Medicare AWV with our clinical staff Have you seen your provider in the last 6 months (3 months if uncontrolled diabetes)? Yes  Clinician Recommendations:  Aim for 30 minutes of exercise or brisk walking, 6-8 glasses of water , and 5 servings of fruits and vegetables each day. TAKE CARE!      This is a list of the screenings recommended for you:   Health Maintenance  Topic Date Due   DEXA scan (bone density measurement)  Never done   Zoster (Shingles) Vaccine (2 of 2) 01/04/2022   COVID-19 Vaccine (5 - 2024-25 season) 01/22/2024   Screening for Lung Cancer  04/24/2024   Mammogram  05/07/2024   Flu Shot  04/26/2024   DTaP/Tdap/Td vaccine (2 - Td or Tdap) 07/23/2024*   Medicare Annual Wellness Visit  05/09/2025   Colon Cancer Screening  12/15/2025   Pneumococcal Vaccine for age over 74  Completed   Hepatitis C Screening  Completed   HPV Vaccine  Aged Out   Meningitis B Vaccine  Aged Out   Pneumococcal Vaccine  Discontinued   Hepatitis B Vaccine  Discontinued  *Topic was postponed. The date shown is not the original due date.    Advanced directives: (ACP Link)Information on Advanced Care Planning can be found at   Secretary of [facility] Advance Health Care Directives Advance Health Care Directives. [email or web address]  Advance Care Planning is important because it:  [x]  Makes sure you receive the medical care that is consistent with your values, goals, and preferences  [x]  It provides guidance to your family and loved ones and reduces their decisional burden about whether or not they are making the right decisions based on your wishes.  Follow the link provided in your after visit summary or read over the paperwork we have mailed to you to help you started getting your Advance Directives in  place. If you need assistance in completing these, please reach out to us  so that we can help you!

## 2024-06-04 ENCOUNTER — Ambulatory Visit

## 2024-06-10 ENCOUNTER — Ambulatory Visit
Admission: RE | Admit: 2024-06-10 | Discharge: 2024-06-10 | Disposition: A | Discharge status: Home / Self Care | Source: Ambulatory Visit | Attending: Acute Care | Admitting: Acute Care

## 2024-06-10 ENCOUNTER — Encounter: Payer: Self-pay | Admitting: Family Medicine

## 2024-06-10 ENCOUNTER — Ambulatory Visit: Admitting: Family Medicine

## 2024-06-10 VITALS — BP 138/70 | HR 83 | Resp 16 | Ht 69.0 in | Wt 112.0 lb

## 2024-06-10 DIAGNOSIS — Z8673 Personal history of transient ischemic attack (TIA), and cerebral infarction without residual deficits: Secondary | ICD-10-CM

## 2024-06-10 DIAGNOSIS — G629 Polyneuropathy, unspecified: Secondary | ICD-10-CM

## 2024-06-10 DIAGNOSIS — I480 Paroxysmal atrial fibrillation: Secondary | ICD-10-CM

## 2024-06-10 DIAGNOSIS — E782 Mixed hyperlipidemia: Secondary | ICD-10-CM

## 2024-06-10 DIAGNOSIS — I10 Essential (primary) hypertension: Secondary | ICD-10-CM

## 2024-06-10 DIAGNOSIS — J441 Chronic obstructive pulmonary disease with (acute) exacerbation: Secondary | ICD-10-CM | POA: Diagnosis not present

## 2024-06-10 DIAGNOSIS — Z7902 Long term (current) use of antithrombotics/antiplatelets: Secondary | ICD-10-CM

## 2024-06-10 DIAGNOSIS — F17200 Nicotine dependence, unspecified, uncomplicated: Secondary | ICD-10-CM

## 2024-06-10 DIAGNOSIS — N1831 Chronic kidney disease, stage 3a: Secondary | ICD-10-CM

## 2024-06-10 DIAGNOSIS — F3160 Bipolar disorder, current episode mixed, unspecified: Secondary | ICD-10-CM

## 2024-06-10 DIAGNOSIS — J411 Mucopurulent chronic bronchitis: Secondary | ICD-10-CM

## 2024-06-10 DIAGNOSIS — G40909 Epilepsy, unspecified, not intractable, without status epilepticus: Secondary | ICD-10-CM

## 2024-06-10 DIAGNOSIS — F17210 Nicotine dependence, cigarettes, uncomplicated: Secondary | ICD-10-CM | POA: Insufficient documentation

## 2024-06-10 DIAGNOSIS — R634 Abnormal weight loss: Secondary | ICD-10-CM

## 2024-06-10 DIAGNOSIS — I739 Peripheral vascular disease, unspecified: Secondary | ICD-10-CM | POA: Diagnosis not present

## 2024-06-10 DIAGNOSIS — Z5181 Encounter for therapeutic drug level monitoring: Secondary | ICD-10-CM

## 2024-06-10 DIAGNOSIS — Z122 Encounter for screening for malignant neoplasm of respiratory organs: Secondary | ICD-10-CM | POA: Diagnosis not present

## 2024-06-10 DIAGNOSIS — E46 Unspecified protein-calorie malnutrition: Secondary | ICD-10-CM

## 2024-06-10 DIAGNOSIS — Z87891 Personal history of nicotine dependence: Secondary | ICD-10-CM | POA: Diagnosis not present

## 2024-06-10 DIAGNOSIS — Z711 Person with feared health complaint in whom no diagnosis is made: Secondary | ICD-10-CM

## 2024-06-10 MED ORDER — LOSARTAN POTASSIUM 100 MG PO TABS: 100.0000 mg | ORAL_TABLET | Freq: Every morning | ORAL | 1 refills | Status: DC

## 2024-06-10 MED ORDER — AMLODIPINE BESYLATE 10 MG PO TABS: 10.0000 mg | ORAL_TABLET | Freq: Every day | ORAL | 1 refills | Status: DC

## 2024-06-10 MED ORDER — CLOPIDOGREL BISULFATE 75 MG PO TABS: 75.0000 mg | ORAL_TABLET | Freq: Every day | ORAL | 1 refills | Status: DC

## 2024-06-10 NOTE — Assessment & Plan Note (Signed)
 Labs recently checked and well controlled on current meds, good compliance no SE or concerns

## 2024-06-10 NOTE — Assessment & Plan Note (Signed)
 Wt Readings from Last 5 Encounters:  06/10/24 112 lb (50.8 kg)  02/05/24 115 lb (52.2 kg)  10/23/23 114 lb 8 oz (51.9 kg)  07/24/23 120 lb 11.2 oz (54.7 kg)  05/19/23 119 lb (54 kg)   BMI Readings from Last 5 Encounters:  06/10/24 16.54 kg/m  02/05/24 16.98 kg/m  10/23/23 16.91 kg/m  07/24/23 20.09 kg/m  05/19/23 19.21 kg/m   Abnormal weight loss Weight decreased by approximately eight pounds over the past year, potentially related to increased smoking, which may suppress appetite. No significant changes in blood pressure or cholesterol levels.  She and her partner are not eating regularly due to time constraints and increased smoking. - Order basic labs and chest CT is pending today for lung cancer screeening - Schedule follow-up appointment in one month for weight check - Discuss with social worker about potential nutritional support programs if needed

## 2024-06-10 NOTE — Progress Notes (Signed)
 Name: Cynthia [name]   MRN: [id number]    DOB: 1956/03/18   Date:06/10/2024       Progress Note  Chief Complaint  Patient presents with   Medical Management of Chronic Issues   Hypertension   COPD     Subjective:   Cynthia [name] is a 68 y.o. female, presents to clinic for routine follow up on chronic conditions  HLD on meds, labs recently checked Lab Results  Component Value Date   CHOL 146 02/05/2024   HDL 57 02/05/2024   LDLCALC 71 02/05/2024   TRIG 97 02/05/2024   CHOLHDL 2.6 02/05/2024   BP Readings from Last 3 Encounters:  06/10/24 138/70  02/05/24 134/76  10/23/23 (!) 156/84   Weight loss? Wt Readings from Last 5 Encounters:  06/10/24 112 lb (50.8 kg)  02/05/24 115 lb (52.2 kg)  10/23/23 114 lb 8 oz (51.9 kg)  07/24/23 120 lb 11.2 oz (54.7 kg)  05/19/23 119 lb (54 kg)   BMI Readings from Last 5 Encounters:  06/10/24 16.54 kg/m  02/05/24 16.98 kg/m  10/23/23 16.91 kg/m  07/24/23 20.09 kg/m  05/19/23 19.21 kg/m   Discussed the use of AI scribe software for clinical note transcription with the patient, who gave verbal consent to proceed.  History of Present Illness Cynthia [name] is a 68 year old female who presents with weight loss and concerns about dementia. She is accompanied by her spouse, who is also her primary caregiver.  Unintentional weight loss and decreased oral intake - Weight loss of approximately eight pounds over the past year - Decreased food intake, with a preference for smoking over eating, especially during the day - No dysphagia or difficulty eating - Plan to increase home-cooked meals as weather gets colder  Cognitive impairment and behavioral changes - Memory impairment with concerns about dementia - Reduced communication - Decreased activity level, spending more time in the bedroom - Previously evaluated by a neurologist in October of the previous year - Recently acquired an aide to assist with care  Tobacco use -  Increased smoking, with preference for smoking over eating  Medication intolerance - Current medications include quetiapine  100 mg, oxcarbazepine  300 mg, mirtazapine  7.5 mg, and trazodone as needed - Dislikes some medications due to side effects, resulting in adjustments to regimen  Genitourinary symptoms - No urinary symptoms      Current Outpatient Medications:    albuterol  (VENTOLIN  HFA) 108 (90 Base) MCG/ACT inhaler, INHALE 2 PUFFS INTO THE LUNGS EVERY 6 HOURS AS NEEDED FOR WHEEZING OR SHORTNESS OF BREATH, Disp: 8.5 g, Rfl: 5   amLODipine  (NORVASC ) 10 MG tablet, Take 1 tablet (10 mg total) by mouth daily., Disp: 90 tablet, Rfl: 1   ARIPiprazole (ABILIFY) 20 MG tablet, Take 10 mg by mouth daily., Disp: , Rfl:    aspirin  EC 81 MG tablet, Take 1 tablet (81 mg total) by mouth daily. Swallow whole., Disp: 30 tablet, Rfl: 5   atorvastatin  (LIPITOR) 20 MG tablet, TAKE 1 TABLET BY MOUTH AT BEDTIME, Disp: 90 tablet, Rfl: 1   clopidogrel  (PLAVIX ) 75 MG tablet, Take 1 tablet (75 mg total) by mouth daily., Disp: 90 tablet, Rfl: 1   Fluticasone -Umeclidin-Vilant (TRELEGY ELLIPTA ) 100-62.5-25 MCG/ACT AEPB, USE ONE INHALATION INTO THE LUNGS ONCE A DAY RINSE MOUTH AFTER EACH USE, Disp: 60 each, Rfl: 3   gabapentin  (NEURONTIN ) 300 MG capsule, Take 300 mg by mouth 2 (two) times daily., Disp: , Rfl:    ipratropium-albuterol  (DUONEB) 0.5-2.5 (3) MG/3ML SOLN, Take  3 mLs by nebulization 3 (three) times daily as needed (for coughing, wheeze, shortness of breath, COPD exacerbation)., Disp: 180 mL, Rfl: 1   losartan  (COZAAR ) 100 MG tablet, Take 1 tablet (100 mg total) by mouth in the morning., Disp: 90 tablet, Rfl: 1   mirtazapine  (REMERON ) 7.5 MG tablet, Take 1 tablet (7.5 mg total) by mouth at bedtime., Disp: 30 tablet, Rfl: 0   nitroGLYCERIN  (NITROSTAT ) 0.4 MG SL tablet, Place 1 tablet (0.4 mg total) under the tongue every 5 (five) minutes as needed for chest pain. Maximum of 3 pills; call 911 at first sign of  chest pain, Disp: 25 tablet, Rfl: 1   Oxcarbazepine  (TRILEPTAL ) 300 MG tablet, Take 300 mg by mouth daily., Disp: , Rfl:    QUEtiapine  (SEROQUEL ) 100 MG tablet, Take 100 mg by mouth daily., Disp: , Rfl:    traZODone (DESYREL) 150 MG tablet, Take 150-300 mg by mouth at bedtime as needed., Disp: , Rfl:    triamcinolone  cream (KENALOG ) 0.1 %, Apply 1 Application topically 2 (two) times daily., Disp: , Rfl:   Patient Active Problem List   Diagnosis Date Noted   Stage 3a chronic kidney disease (HCC) 04/13/2023   Tobacco use 03/03/2023   Malnutrition of moderate degree (HCC) 12/19/2022   At high risk for falls 12/06/2022   Impaired mobility and ADLs 12/06/2022   Paroxysmal A-fib (HCC) 07/22/2020   Other sequelae following unspecified cerebrovascular disease 04/28/2020   Bipolar 1 disorder, mixed, full remission (HCC) 12/06/2019   PAD (peripheral artery disease) (HCC) 10/14/2019   Cannabis use, unspecified, uncomplicated 09/27/2019   Epilepsy, unspecified, not intractable, without status epilepticus (HCC) 09/27/2019   Other reduced mobility 09/27/2019   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 09/27/2019   Prsnl hx of TIA (TIA), and cereb infrc w/o resid deficits 09/27/2019   Unspecified protein-calorie malnutrition (HCC) 09/27/2019   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 09/27/2019   Bipolar I disorder, most recent episode mixed (HCC) 07/22/2019   Cirrhosis of liver (HCC) 11/26/2018   Lung nodule, multiple 11/26/2018   Mixed hyperlipidemia 04/04/2018   Bilateral leg weakness 11/14/2016   Memory impairment 09/23/2016   Hearing loss in left ear 06/24/2016   Chronic hip pain 03/17/2016   HSV infection 01/05/2016   Cervical dysplasia, mild 01/05/2016   Coronary artery disease 11/10/2015   Low grade squamous intraepithelial lesion (LGSIL) on Papanicolaou smear of cervix 10/25/2015   Generalized anxiety disorder 10/19/2015   COPD (chronic obstructive pulmonary disease)  (HCC) 09/21/2015   Essential hypertension, benign 09/17/2015   Hx of tobacco use, presenting hazards to health 09/17/2015   History of traumatic brain injury 09/17/2015   Neuropathy 09/17/2015   Abnormality of gait and mobility 09/17/2015   Second degree heart block 09/17/2015   Hx of hepatitis C 05/04/2012   Bipolar affective disorder (HCC) 04/02/2011    Past Surgical History:  Procedure Laterality Date   COLONOSCOPY  07/31/13   done at [facility], Dr. Debarah   LOWER EXTREMITY ANGIOGRAPHY Left 10/29/2019   Procedure: LOWER EXTREMITY ANGIOGRAPHY;  Surgeon: Jama Cordella MATSU, MD;  Location: [facility] INVASIVE CV LAB;  Service: Cardiovascular;  Laterality: Left;   PACEMAKER PLACEMENT  07/2009   PPM GENERATOR CHANGEOUT N/A 04/19/2022   Procedure: PPM GENERATOR CHANGEOUT;  Surgeon: Ammon Blunt, MD;  Location: [facility] INVASIVE CV LAB;  Service: Cardiovascular;  Laterality: N/A;   TUBAL LIGATION      Family History  Problem Relation Age of Onset   Heart disease  Mother    Hypertension Mother    Cancer Mother        breast   Anxiety disorder Mother    Breast cancer Mother 31   Cancer Father        multiple myeloma   Hypertension Father    Alcohol abuse Father    Mental illness Sister    Schizophrenia Sister    Cancer Sister        breast   Alcohol abuse Brother    Drug abuse Brother    Bipolar disorder Brother    Alcohol abuse Sister    Drug abuse Sister    Bipolar disorder Sister    Cancer Maternal Aunt    Heart disease Maternal Aunt    Stroke Maternal Aunt    Heart disease Maternal Grandmother    Hypertension Maternal Grandmother    Hypertension Maternal Grandfather    Hypertension Paternal Grandmother    Cancer Paternal Grandfather    Hypertension Paternal Grandfather    Stroke Paternal Grandfather    COPD Neg Hx    Diabetes Neg Hx     Social History   Tobacco Use   Smoking status: Every Day    Current packs/day: 1.00    Average packs/day: 2.8 packs/day for 53.2  years (151.2 ttl pk-yrs)    Types: Cigarettes    Start date: 03/21/1971    Last attempt to quit: 03/20/2020   Smokeless tobacco: Never   Tobacco comments:    1PPD 04/05/2023 - not sure when she restarted - she has been smoking at max 1 ppd for the past 4 years that I've known her and prior to that 1ppd     1PPD- 05/19/2023 khj  Vaping Use   Vaping status: Never Used  Substance Use Topics   Alcohol use: Not Currently    Comment: Occassional    Drug use: Yes    Types: Marijuana    Comment: reports smokes every night if i got it: last used 7/24 PM     Allergies  Allergen Reactions   Morphine Anaphylaxis    Cardiac arrest    Health Maintenance  Topic Date Due   Lung Cancer Screening  04/24/2024   Influenza Vaccine  04/26/2024   COVID-19 Vaccine (5 - 2024-25 season) 06/26/2024 (Originally 05/27/2024)   DTaP/Tdap/Td (2 - Td or Tdap) 07/23/2024 (Originally 04/11/2023)   Zoster Vaccines- Shingrix (2 of 2) 09/09/2024 (Originally 01/04/2022)   Mammogram  06/10/2025 (Originally 05/07/2024)   DEXA SCAN  06/10/2025 (Originally 12/22/2019)   Medicare Annual Wellness (AWV)  05/09/2025   Colonoscopy  12/15/2025   Pneumococcal Vaccine: 50+ Years  Completed   Hepatitis C Screening  Completed   HPV VACCINES  Aged Out   Meningococcal B Vaccine  Aged Out   Hepatitis B Vaccines 19-59 Average Risk  Discontinued    Chart Review Today: I personally reviewed active problem list, medication list, allergies, family history, social history, health maintenance, notes from last encounter, lab results, imaging with the patient/caregiver today.   Review of Systems  Constitutional: Negative.   HENT: Negative.    Eyes: Negative.   Respiratory: Negative.    Cardiovascular: Negative.   Gastrointestinal: Negative.   Endocrine: Negative.   Genitourinary: Negative.   Musculoskeletal: Negative.   Skin: Negative.   Allergic/Immunologic: Negative.   Neurological: Negative.   Hematological: Negative.    Psychiatric/Behavioral: Negative.    All other systems reviewed and are negative.    Objective:   Vitals:   06/10/24 0920  BP: 138/70  Pulse: 83  Resp: 16  SpO2: 96%  Weight: 112 lb (50.8 kg)  Height: 5' 9 (1.753 m)    Body mass index is 16.54 kg/m.  Physical Exam Vitals and nursing note reviewed.  Constitutional:      General: She is not in acute distress.    Appearance: Normal appearance. She is well-developed. She is not ill-appearing, toxic-appearing or diaphoretic.  HENT:     Head: Normocephalic and atraumatic.     Right Ear: External ear normal.     Left Ear: External ear normal.     Nose: Nose normal.  Eyes:     General: No scleral icterus.       Right eye: No discharge.        Left eye: No discharge.     Conjunctiva/sclera: Conjunctivae normal.  Neck:     Trachea: No tracheal deviation.  Cardiovascular:     Rate and Rhythm: Normal rate.  Pulmonary:     Effort: Pulmonary effort is normal. No tachypnea or respiratory distress.     Breath sounds: No stridor or transmitted upper airway sounds.  Skin:    General: Skin is warm and dry.     Findings: No rash.  Neurological:     Mental Status: She is alert. Mental status is at baseline.     Motor: No abnormal muscle tone.     Coordination: Coordination normal.     Gait: Gait normal.  Psychiatric:        Attention and Perception: She is inattentive.        Mood and Affect: Mood is depressed.        Behavior: Behavior is slowed. Behavior is cooperative.        Thought Content: Thought content normal.        Cognition and Memory: Memory is impaired.       Results for orders placed or performed in visit on 02/05/24  CBC with Differential/Platelet   Collection Time: 02/05/24  9:42 AM  Result Value Ref Range   WBC 7.4 3.8 - 10.8 Thousand/uL   RBC 4.97 3.80 - 5.10 Million/uL   Hemoglobin 14.4 11.7 - 15.5 g/dL   HCT 56.2 64.9 - 54.9 %   MCV 87.9 80.0 - 100.0 fL   MCH 29.0 27.0 - 33.0 pg   MCHC 33.0 32.0  - 36.0 g/dL   RDW 85.8 88.9 - 84.9 %   Platelets 200 140 - 400 Thousand/uL   MPV 10.3 7.5 - 12.5 fL   Neutro Abs 4,891 1,500 - 7,800 cells/uL   Absolute Lymphocytes 1,872 850 - 3,900 cells/uL   Absolute Monocytes 451 200 - 950 cells/uL   Eosinophils Absolute 74 15 - 500 cells/uL   Basophils Absolute 111 0 - 200 cells/uL   Neutrophils Relative % 66.1 %   Total Lymphocyte 25.3 %   Monocytes Relative 6.1 %   Eosinophils Relative 1.0 %   Basophils Relative 1.5 %  Comprehensive metabolic panel with GFR   Collection Time: 02/05/24  9:42 AM  Result Value Ref Range   Glucose, Bld 98 65 - 99 mg/dL   BUN 19 7 - 25 mg/dL   Creat 8.84 (H) 9.49 - 1.05 mg/dL   eGFR 52 (L) > OR = 60 mL/min/1.72m2   BUN/Creatinine Ratio 17 6 - 22 (calc)   Sodium 137 135 - 146 mmol/L   Potassium 4.5 3.5 - 5.3 mmol/L   Chloride 104 98 - 110 mmol/L   CO2 24 20 -  32 mmol/L   Calcium  10.0 8.6 - 10.4 mg/dL   Total Protein 7.7 6.1 - 8.1 g/dL   Albumin  4.4 3.6 - 5.1 g/dL   Globulin 3.3 1.9 - 3.7 g/dL (calc)   AG Ratio 1.3 1.0 - 2.5 (calc)   Total Bilirubin 0.5 0.2 - 1.2 mg/dL   Alkaline phosphatase (APISO) 70 37 - 153 U/L   AST 12 10 - 35 U/L   ALT 8 6 - 29 U/L  Lipid panel   Collection Time: 02/05/24  9:42 AM  Result Value Ref Range   Cholesterol 146 <200 mg/dL   HDL 57 > OR = 50 mg/dL   Triglycerides 97 <849 mg/dL   LDL Cholesterol (Calc) 71 mg/dL (calc)   Total CHOL/HDL Ratio 2.6 <5.0 (calc)   Non-HDL Cholesterol (Calc) 89 <869 mg/dL (calc)      Assessment & Plan:    Assessment & Plan  PAD (peripheral artery disease) (HCC) Assessment & Plan: Seeing vascular annually on DAPT and statin  Orders: -     CBC with Differential/Platelet -     Comprehensive metabolic panel with GFR -     Clopidogrel  Bisulfate; Take 1 tablet (75 mg total) by mouth daily.  Dispense: 90 tablet; Refill: 1  Essential hypertension, benign Assessment & Plan: BP stable and near goal today on current meds  Orders: -      Comprehensive metabolic panel with GFR -     amLODIPine  Besylate; Take 1 tablet (10 mg total) by mouth daily.  Dispense: 90 tablet; Refill: 1 -     Losartan  Potassium; Take 1 tablet (100 mg total) by mouth in the morning.  Dispense: 90 tablet; Refill: 1  Stage 3a chronic kidney disease (HCC) Assessment & Plan: Recheck labs today, last eGFR was 57  Orders: -     Comprehensive metabolic panel with GFR -     amLODIPine  Besylate; Take 1 tablet (10 mg total) by mouth daily.  Dispense: 90 tablet; Refill: 1 -     Losartan  Potassium; Take 1 tablet (100 mg total) by mouth in the morning.  Dispense: 90 tablet; Refill: 1  Chronic obstructive pulmonary disease with acute exacerbation (HCC) -     CBC with Differential/Platelet -     Comprehensive metabolic panel with GFR  Mixed hyperlipidemia Assessment & Plan: Labs recently checked and well controlled on current meds, good compliance no SE or concerns  Orders: -     Comprehensive metabolic panel with GFR  Bipolar affective disorder, current episode mixed, current episode severity unspecified (HCC)  Mucopurulent chronic bronchitis (HCC)  Paroxysmal A-fib (HCC) Assessment & Plan: Implanted pacemaker defib per cardiology, not able to be anticoagulated due to high risk of falls/bleed   Nonintractable epilepsy without status epilepticus, unspecified epilepsy type (HCC) Assessment & Plan: Per neuro - needs f/up  Orders: -     Ambulatory referral to Neurology  Prsnl hx of TIA (TIA), and cereb infrc w/o resid deficits -     Ambulatory referral to Neurology  Protein-calorie malnutrition, unspecified severity (HCC) Assessment & Plan: Wt Readings from Last 5 Encounters:  06/10/24 112 lb (50.8 kg)  02/05/24 115 lb (52.2 kg)  10/23/23 114 lb 8 oz (51.9 kg)  07/24/23 120 lb 11.2 oz (54.7 kg)  05/19/23 119 lb (54 kg)   BMI Readings from Last 5 Encounters:  06/10/24 16.54 kg/m  02/05/24 16.98 kg/m  10/23/23 16.91 kg/m  07/24/23 20.09  kg/m  05/19/23 19.21 kg/m   Abnormal weight loss Weight decreased  by approximately eight pounds over the past year, potentially related to increased smoking, which may suppress appetite. No significant changes in blood pressure or cholesterol levels.  She and her partner are not eating regularly due to time constraints and increased smoking. - Order basic labs and chest CT is pending today for lung cancer screeening - Schedule follow-up appointment in one month for weight check - Discuss with social worker about potential nutritional support programs if needed  Orders: -     TSH  Neuropathy Assessment & Plan: Does seen neurology and is managed on gabapentin   Orders: -     Ambulatory referral to Neurology  Current every day smoker Assessment & Plan: Nicotine  dependence Increased smoking may be contributing to weight loss by curbing appetite. Discussed the impact of smoking on appetite and overall health. Smoking cessation or decrease strongly encouraged   Unintentional weight loss - see above -     CBC with Differential/Platelet -     Comprehensive metabolic panel with GFR -     TSH -     DG Chest 2 View  Encounter for monitoring antiplatelet therapy -     CBC with Differential/Platelet -     Comprehensive metabolic panel with GFR  Concern about memory -     Ambulatory referral to Neurology Concerns about memory and potential dementia with changes in behavior since ICU stay. Partner reports increased symptoms and difficulty managing appointments. No recent follow-up with neurology. - Reconnect with neurologist Dr. Lane for evaluation of memory and dementia concerns  Encounter for medication monitoring -     CBC with Differential/Platelet -     Comprehensive metabolic panel with GFR -     TSH   Recording duration: 17 minutes     Return for 1-2 month f/up weight check.   Michelene Cower, PA-C 06/10/24 9:41 AM

## 2024-06-10 NOTE — Assessment & Plan Note (Signed)
 Seeing vascular annually on DAPT and statin

## 2024-06-10 NOTE — Assessment & Plan Note (Signed)
Does seen neurology and is managed on gabapentin

## 2024-06-10 NOTE — Assessment & Plan Note (Signed)
 BP stable and near goal today on current meds

## 2024-06-10 NOTE — Patient Instructions (Signed)
 Plan of Treatment - documented as of this encounter  Plan of Treatment - Upcoming Encounters Upcoming Encounters Date Type Department Care Team (Latest Contact Info) Description  10/18/2023 10:15 AM EST Office Visit [facility]  [address]  [place], [place] [address]  [phone]  Lane Arthea Locus, MD  [address]  [facility]  [place], [place] [address]  [phone] (Work)  [phone] (Fax)  3 month fu    Need to reschedule with Neurology  I will put in the referral again just so they know you have concerns about memory/dementia

## 2024-06-10 NOTE — Assessment & Plan Note (Signed)
 Per neuro - needs f/up

## 2024-06-10 NOTE — Assessment & Plan Note (Signed)
 Recheck labs today, last eGFR was 57

## 2024-06-10 NOTE — Assessment & Plan Note (Signed)
Implanted pacemaker defib per cardiology, not able to be anticoagulated due to high risk of falls/bleed

## 2024-06-10 NOTE — Assessment & Plan Note (Signed)
 Nicotine  dependence Increased smoking may be contributing to weight loss by curbing appetite. Discussed the impact of smoking on appetite and overall health. Smoking cessation or decrease strongly encouraged

## 2024-06-11 ENCOUNTER — Ambulatory Visit: Payer: Self-pay | Admitting: Family Medicine

## 2024-06-11 DIAGNOSIS — H2512 Age-related nuclear cataract, left eye: Secondary | ICD-10-CM | POA: Diagnosis not present

## 2024-06-11 DIAGNOSIS — H2511 Age-related nuclear cataract, right eye: Secondary | ICD-10-CM | POA: Diagnosis not present

## 2024-06-11 DIAGNOSIS — H538 Other visual disturbances: Secondary | ICD-10-CM | POA: Diagnosis not present

## 2024-06-11 LAB — COMPREHENSIVE METABOLIC PANEL WITH GFR
AG Ratio: 1.3 (calc) (ref 1.0–2.5)
ALT: 9 U/L (ref 6–29)
AST: 14 U/L (ref 10–35)
Albumin: 4.7 g/dL (ref 3.6–5.1)
Alkaline phosphatase (APISO): 66 U/L (ref 37–153)
BUN/Creatinine Ratio: 13 (calc) (ref 6–22)
BUN: 14 mg/dL (ref 7–25)
CO2: 24 mmol/L (ref 20–32)
Calcium: 10.3 mg/dL (ref 8.6–10.4)
Chloride: 104 mmol/L (ref 98–110)
Creat: 1.08 mg/dL — ABNORMAL HIGH (ref 0.50–1.05)
Globulin: 3.5 g/dL (ref 1.9–3.7)
Glucose, Bld: 108 mg/dL — ABNORMAL HIGH (ref 65–99)
Potassium: 4.6 mmol/L (ref 3.5–5.3)
Sodium: 138 mmol/L (ref 135–146)
Total Bilirubin: 0.4 mg/dL (ref 0.2–1.2)
Total Protein: 8.2 g/dL — ABNORMAL HIGH (ref 6.1–8.1)
eGFR: 56 mL/min/1.73m2 — ABNORMAL LOW (ref >=60)

## 2024-06-11 LAB — CBC WITH DIFFERENTIAL/PLATELET
Absolute Lymphocytes: 1573 {cells}/uL (ref 850–3900)
Absolute Monocytes: 298 {cells}/uL (ref 200–950)
Basophils Absolute: 102 {cells}/uL (ref 0–200)
Basophils Relative: 1.2 %
Eosinophils Absolute: 102 {cells}/uL (ref 15–500)
Eosinophils Relative: 1.2 %
HCT: 51.5 % — ABNORMAL HIGH (ref 35.0–45.0)
Hemoglobin: 17.2 g/dL — ABNORMAL HIGH (ref 11.7–15.5)
MCH: 30.9 pg (ref 27.0–33.0)
MCHC: 33.4 g/dL (ref 32.0–36.0)
MCV: 92.5 fL (ref 80.0–100.0)
MPV: 10.5 fL (ref 7.5–12.5)
Monocytes Relative: 3.5 %
Neutro Abs: 6426 {cells}/uL (ref 1500–7800)
Neutrophils Relative %: 75.6 %
Platelets: 202 Thousand/uL (ref 140–400)
RBC: 5.57 Million/uL — ABNORMAL HIGH (ref 3.80–5.10)
RDW: 13.2 % (ref 11.0–15.0)
Total Lymphocyte: 18.5 %
WBC: 8.5 Thousand/uL (ref 3.8–10.8)

## 2024-06-11 LAB — TSH: TSH: 0.73 m[IU]/L (ref 0.40–4.50)

## 2024-06-11 LAB — EXTRA

## 2024-06-12 ENCOUNTER — Other Ambulatory Visit: Payer: Self-pay

## 2024-06-12 DIAGNOSIS — D582 Other hemoglobinopathies: Secondary | ICD-10-CM

## 2024-06-17 ENCOUNTER — Telehealth: Payer: Self-pay | Admitting: Acute Care

## 2024-06-17 NOTE — Telephone Encounter (Signed)
 Received the official call report on LDCT.  Provider has already picked up the report and attempted to reach the patient by phone ( see attached documentation).

## 2024-06-17 NOTE — Telephone Encounter (Signed)
 I have attempted to call the patient with the results of her lung cancer screening scan. There was no answer. The VM was full and I was unable to leave a message. Her scan was read as a LR 4 B. She has a Multiple regions of clustered  previously visualized pulmonary nodules, largest a ground-glass 10.4 mm apical right upper lobe nodule that is stable. She has multiple new patchy regions of clustered tree-in-bud type and branching opacities throughout the peripheral lower lobes bilaterally, largest 12.7 mm in the anterior basilar right lower lobe.   Suspicion is that this is favoring an infectious or inflammatory bronchiolitis . She is followed in the [place] office. If she has been sick, please schedule with one of the [facility] MD's and then we will schedule the follow up scan in 8 weeks. If she has not been sick, please schedule follow up scan in 3 months.  Please let PCP know plan for care. Thanks

## 2024-06-18 NOTE — Telephone Encounter (Signed)
 Spoke with patient's husband (DPR) regarding CT results. He reports that pt has been having increased cough and increase use if nebulizer. Also c/o decreased appetite. Pt has appt to see Dr Isadora on 06/25/24. Advised to keep that appt and he will advise on CT findings at that time. We will plan to repeat CT in 8 weeks unless instructed differently after appt with Dr Isadora. He verbalized understanding and had no further questions.

## 2024-06-25 ENCOUNTER — Encounter: Payer: Self-pay | Admitting: Student in an Organized Health Care Education/Training Program

## 2024-06-25 ENCOUNTER — Ambulatory Visit: Admitting: Student in an Organized Health Care Education/Training Program

## 2024-06-25 VITALS — BP 120/80 | HR 81 | Temp 97.1°F | Ht 69.0 in | Wt 114.4 lb

## 2024-06-25 DIAGNOSIS — R911 Solitary pulmonary nodule: Secondary | ICD-10-CM

## 2024-06-25 DIAGNOSIS — J441 Chronic obstructive pulmonary disease with (acute) exacerbation: Secondary | ICD-10-CM

## 2024-06-25 DIAGNOSIS — F17200 Nicotine dependence, unspecified, uncomplicated: Secondary | ICD-10-CM

## 2024-06-25 DIAGNOSIS — F17210 Nicotine dependence, cigarettes, uncomplicated: Secondary | ICD-10-CM | POA: Diagnosis not present

## 2024-06-25 DIAGNOSIS — J449 Chronic obstructive pulmonary disease, unspecified: Secondary | ICD-10-CM | POA: Diagnosis not present

## 2024-06-25 MED ORDER — ALBUTEROL SULFATE HFA 108 (90 BASE) MCG/ACT IN AERS: INHALATION_SPRAY | RESPIRATORY_TRACT | 11 refills | Status: DC

## 2024-06-25 MED ORDER — AMOXICILLIN-POT CLAVULANATE 875-125 MG PO TABS: 1.0000 | ORAL_TABLET | Freq: Two times a day (BID) | ORAL | 0 refills | Status: AC

## 2024-06-25 MED ORDER — TRELEGY ELLIPTA 100-62.5-25 MCG/ACT IN AEPB: 1.0000 | INHALATION_SPRAY | Freq: Every day | RESPIRATORY_TRACT | 11 refills | Status: DC

## 2024-06-25 NOTE — Patient Instructions (Signed)
 The [place]  Quitline: Call 1-800-QUIT-NOW (682 840 6469). The [place] Quitline is a free service for [place]  residents. Trained counselors are available from 8 am until 3 am, 365 days per year. Services are available in both [place] and [place].   Web Resources Free online support programs can help you track your progress and share experiences with others who are quitting. These are examples: www.becomeanex.org www.trytostop.org  www.smokefree.gov  www.https://www.vargas.com/.aspx  [facility]: offers comprehensive in-person tobacco treatment counseling at [facility] building ([address], [place] [address]).  Open to everyone. Virtual appointments available. Free parking. Call [phone] to schedule an appointment or [phone] for general information.    Tobacco Cessation Medications  Nicotine  Replacement Therapy (NRT)  Nicotine  is the addictive part of tobacco smoke, but not the most dangerous part. There are 7000 other toxins in cigarettes, including carbon monoxide, that cause disease. People do not generally become addicted to medication. Common problems: People don't use enough medication or stop too early. Medications are safe and effective. Overdose is very uncommon. Use medications as long as needed (3 months minimum). Some combinations work better than single medications. Long acting medications like the NRT patch and bupropion provide continuous treatment for withdrawal symptoms.  PLUS  Short acting medications like the NRT gum, lozenge, inhaler, and nasal spray help people to cope with breakthrough cravings.  ? Nicotine  Patch  Place patch on hairless skin on upper body, including arms and back. Each day: discard old patch, shower, apply new patch to a different site. Apply hydrocortisone cream to mildly red/irritated areas. Call provider if rash develops. If patch causes sleep disturbance, remove patch  at bedtime and replace each morning after shower. Side effects may include: skin irritation, headache, insomnia, abnormal/vivid dreams.  ? Nicotine  Gum  Chew gum slowly, park in cheek when peppery taste or tingling sensation begins (about 15-30 chews). When taste or tingling goes away, begin chewing again. Use until nicotine  is gone (taste or tingle does not return, usually 30 minutes). Park in different areas of mouth. Nicotine  is absorbed through the lining of the mouth. Use enough to control cravings, up to 24 pieces per day (if used alone). Avoid eating or drinking for 15 minutes before using and during use. Side effects may include: mouth/jaw soreness, hiccups, indigestion, hypersalivation.  If gum is not chewed correctly, additional side effects may include lightheadedness, nausea/vomiting, throat and mouth irritation.  ? Nicotine  Lozenge  Allow to dissolve slowly in mouth (20-30 minutes). Do not chew or swallow. Nicotine  release may cause a warm tingling sensation. Occasionally rotate to different areas of the mouth. Use enough to control cravings, up to 20 lozenges per day (if used alone). Avoid eating or drinking for 15 minutes before using and during use. Side effects may include: nausea, hiccups, cough, heartburn, headache, gas, insomnia.  ? Nicotine  Nasal Spray Use 1 spray in each nostril (1 dose) and tilt head back for 1 minute. Do not sniff, swallow, or inhale through nose.  Use at least 8 doses (1 spray in each nostril) , up to 40 doses per day (if used alone). To reduce nasal irritation, spray on cotton swab and insert into nose. Side effects may include: nasal and/or throat irritation (hot, peppery, or burning sensation), nasal irritation, tearing, sneezing, cough, headache.  ? Nicotine  Oral Inhaler (puffer) Inhale into the back of the throat or puff in short breaths. Do not inhale into the lungs.  Puff continuously for 20 minutes (about 80 puffs) until cartridge  is  empty. Change cartridge when it loses the "burning in throat" sensation (feels like air only). Open cartridges can be saved and used again within 24 hours. Use at least 6 and up to 16 cartridges per day (if used alone).  Avoid eating or drinking for 15 minutes before using and during use. Side effects may include: mouth and/or throat irritation, unpleasant taste, cough, nasal irritation, indigestion, hiccups, headache.  ? Chantix  (varenicline ) Days 1-3: Take one 0.5 mg white pill each morning for 3 days, one week before quit date. Days 4-7: Increase to one 0.5 mg white pill twice a day in morning and evening for 4 days.  On Day 8 (target quit date), increase to one 1 mg blue pill twice a day. Maintain this dose for a minimum of 3 months. Take with food and a full glass of water to reduce nausea. Be sure that the two doses are at least 8 hours apart, but try to take second dose early in the evening (i.e. 6 pm) to avoid sleep problems. Common side effects include: nausea, insomnia, headache, abnormal/vivid dreams. Tell your doctor if you have any history of psychiatric illness prior to starting Chantix .  STOP taking CHANTIX  and contact a healthcare provider immediately if you experience agitation, hostility, depressed mood, changes in thoughts or behavior that are not typical for you, thinking about or attempting suicide, allergic or skin reactions including swelling, rash, redness, or peeling of the skin.  For patients who have heart disease: Smoking is a major risk factor for cardiovascular disease, and Chantix  can help you quit smoking. Chantix  may be associated with a small, increased risk of certain heart events in patients who have heart disease. If you have any new or worsening symptoms of heart disease while taking Chantix , such as shortness of breath or trouble breathing, new or worsening chest pain, or new or worsening pain in your legs when walking, call your doctor or get emergency medical  help immediately.  ? Wellbutrin / Zyban (bupropion) Take one 150 mg pill each morning for 3 days, one week before target quit date. On Day 4, increase to one 150 mg pill twice a day, morning and evening.  Maintain this dose for a minimum of 3 months. Be sure that the two doses are at least 8 hours apart, but try to take second dose early in the evening (i.e. 6 pm) to avoid sleep problems. Avoid or minimize use of alcohol when taking this medication. Common side effects include: dry mouth, headache, insomnia, nausea, weight loss.  Risk of seizure is 09/998. STOP taking BUPROPION and contact a healthcare provider immediately if you experience agitation, hostility, depressed mood, changes in thoughts or behavior that are not typical for you, thinking about or attempting suicide, allergic or skin reactions including swelling, rash, redness, or peeling of the skin.
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
## 2024-06-28 ENCOUNTER — Encounter: Admitting: Student in an Organized Health Care Education/Training Program

## 2024-07-08 ENCOUNTER — Other Ambulatory Visit: Payer: Self-pay | Admitting: *Deleted

## 2024-07-08 DIAGNOSIS — R911 Solitary pulmonary nodule: Secondary | ICD-10-CM

## 2024-07-08 DIAGNOSIS — Z87891 Personal history of nicotine dependence: Secondary | ICD-10-CM

## 2024-07-15 ENCOUNTER — Other Ambulatory Visit: Payer: Self-pay | Admitting: Family Medicine

## 2024-07-15 DIAGNOSIS — I25119 Atherosclerotic heart disease of native coronary artery with unspecified angina pectoris: Secondary | ICD-10-CM

## 2024-07-15 DIAGNOSIS — I739 Peripheral vascular disease, unspecified: Secondary | ICD-10-CM

## 2024-07-16 ENCOUNTER — Telehealth: Payer: Self-pay

## 2024-07-16 NOTE — Telephone Encounter (Signed)
 Teed up

## 2024-07-16 NOTE — Telephone Encounter (Signed)
 Pt is completely out of atoravastatin is it possible to refill. I see where a 90 day supple with 1 refill was sent in May but pt says she is out. Please contact patient

## 2024-07-16 NOTE — Telephone Encounter (Signed)
 Copied from CRM [id number]. Topic: Clinical - Prescription Issue >> Jul 16, 2024  2:07 PM Amber H wrote: Reason for CRM: Lorn from [facility] pharmacy called to get a request refilled for the patients atorvastatin  (LIPITOR) 20 MG tablet and stated patient was currently at the pharmacy due to being completely out of the med. I reached out to CAL and they stated the patient should not be out because she was recently prescribed a 90day supply. I was advised to send a message for the nurses. Lorn stated her caregiver gave her too many and so she ran out of meds. But does need med refilled ASAP.     Lorn- [phone]

## 2024-07-26 NOTE — Progress Notes (Signed)
 Pharmacy Quality Measure Review  This patient is appearing on a report for being at risk of failing the adherence measure for hypertension (ACEi/ARB) medications this calendar year.   Medication: losartan  Last fill date: 07/11/24 for 90 day supply  Insurance report was not up to date. No action needed at this time.   Waunetta Riggle E. Marsh, PharmD, CPP Clinical Pharmacist [facility] [phone]

## 2024-08-05 ENCOUNTER — Ambulatory Visit: Admitting: Family Medicine

## 2024-08-08 ENCOUNTER — Telehealth: Payer: Self-pay

## 2024-08-08 NOTE — Telephone Encounter (Signed)
 Copied from CRM [id number]. Topic: General - Other >> Aug 07, 2024  9:58 AM Donee H wrote: Reason for CRM: Harlene from [facility] of Department of Social Services calling in regards to patient. She is concern about patient's wellbeing and want to know if an IVC was order for her. She states if so she can find a way to get patient to clinic. Warm transferred to clinic >> Aug 08, 2024  2:47 PM Antwanette L wrote: Harlene from [facility] called to speak with Clotilda regarding an IVC order. Harlene needs a copy of the IVC order and it can be fax to [phone]. Harlene is requesting a callback at  [phone]

## 2024-08-09 NOTE — Telephone Encounter (Signed)
 Lvm informing Jessica from DSS.

## 2024-08-14 ENCOUNTER — Telehealth: Payer: Self-pay

## 2024-08-14 NOTE — Telephone Encounter (Signed)
 LVM to schedule f/u scan.

## 2024-08-26 ENCOUNTER — Ambulatory Visit: Payer: Self-pay
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
## 2024-08-26 NOTE — Telephone Encounter (Signed)
 Called tried to schedule states will call back

## 2024-08-27 ENCOUNTER — Telehealth: Payer: Self-pay | Admitting: Internal Medicine

## 2024-08-27 ENCOUNTER — Other Ambulatory Visit: Payer: Self-pay

## 2024-08-27 DIAGNOSIS — I25119 Atherosclerotic heart disease of native coronary artery with unspecified angina pectoris: Secondary | ICD-10-CM

## 2024-08-27 DIAGNOSIS — I10 Essential (primary) hypertension: Secondary | ICD-10-CM

## 2024-08-27 DIAGNOSIS — I739 Peripheral vascular disease, unspecified: Secondary | ICD-10-CM

## 2024-08-27 DIAGNOSIS — N1831 Chronic kidney disease, stage 3a: Secondary | ICD-10-CM

## 2024-08-27 MED ORDER — LOSARTAN POTASSIUM 100 MG PO TABS: 100.0000 mg | ORAL_TABLET | Freq: Every morning | ORAL | 0 refills | Status: AC

## 2024-08-27 MED ORDER — ATORVASTATIN CALCIUM 20 MG PO TABS: 20.0000 mg | ORAL_TABLET | Freq: Every day | ORAL | 0 refills | Status: AC

## 2024-08-27 MED ORDER — AMLODIPINE BESYLATE 10 MG PO TABS: 10.0000 mg | ORAL_TABLET | Freq: Every day | ORAL | 0 refills | Status: AC

## 2024-08-27 MED ORDER — CLOPIDOGREL BISULFATE 75 MG PO TABS: 75.0000 mg | ORAL_TABLET | Freq: Every day | ORAL | 0 refills | Status: AC

## 2024-08-27 NOTE — Telephone Encounter (Signed)
 Sent!
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
## 2024-08-28 ENCOUNTER — Telehealth: Payer: Self-pay
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
## 2024-09-06 NOTE — Telephone Encounter (Signed)
 Lft vm we do not do direct admission she will need to go to ER

## 2024-09-06 NOTE — Telephone Encounter (Unsigned)
 Copied from CRM #[id number]. Topic: General - Other >> Sep 06, 2024  3:39 PM Delon T wrote: Reason for CRM: husband calling, patient needs a direct admission to [facility] from [facility]- trying to get it done by MondayGLENWOOD Eck [phone] 1627[phone]-  patient had wandered away from home again and they are trying to get her suitable help, not happy with [facility]- can only take calls til 4p on Monday due to work schedule

## 2024-09-10 ENCOUNTER — Encounter: Payer: Self-pay | Admitting: Family Medicine

## 2024-09-10 ENCOUNTER — Ambulatory Visit: Admitting: Family Medicine

## 2024-09-10 VITALS — BP 132/72 | HR 84 | Resp 16 | Ht 69.0 in | Wt 116.7 lb

## 2024-09-10 DIAGNOSIS — F3160 Bipolar disorder, current episode mixed, unspecified: Secondary | ICD-10-CM

## 2024-09-10 DIAGNOSIS — J449 Chronic obstructive pulmonary disease, unspecified: Secondary | ICD-10-CM
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
## 2024-09-11 ENCOUNTER — Telehealth: Payer: Self-pay

## 2024-09-11 NOTE — Addendum Note (Signed)
 Addended by: GLENARD MIRE F on: 09/11/2024 04:30 PM   Modules accepted: Orders

## 2024-09-11 NOTE — Telephone Encounter (Signed)
 Called pt spouse to inform him PCS form were completed and faxed to [facility], CAP program and PCS facility. Pt spouse wants a copy and ready for him upfront.

## 2024-09-16 ENCOUNTER — Telehealth: Payer: Self-pay

## 2024-09-16 NOTE — Telephone Encounter (Signed)
 Copied from CRM #[id number]. Topic: Clinical - Home Health Verbal Orders >> Sep 13, 2024  4:26 PM Leonette SQUIBB wrote: Caller/Agency: Marcellus with [facility]  Callback Number: [phone]  Service Requested: Skilled Nursing Frequency: 1 week 5   Any new concerns about the patient? Yes

## 2024-09-16 NOTE — Telephone Encounter (Signed)
 VO given.

## 2024-09-20 ENCOUNTER — Telehealth: Payer: Self-pay

## 2024-09-20 NOTE — Telephone Encounter (Signed)
 Copied from CRM #[id number]. Topic: Clinical - Home Health Verbal Orders >> Sep 20, 2024 11:43 AM Berwyn MATSU wrote: Caller/Agency: Medford from [facility]  Callback Number: [phone] Service Requested: Physical Therapy Frequency: 1 week 8  Any new concerns about the patient? No

## 2024-09-20 NOTE — Telephone Encounter (Signed)
 VO given.

## 2024-09-23 ENCOUNTER — Ambulatory Visit
Admission: RE | Admit: 2024-09-23 | Discharge: 2024-09-23 | Disposition: A | Discharge status: Home / Self Care | Source: Ambulatory Visit | Attending: Acute Care | Admitting: Acute Care

## 2024-09-23 DIAGNOSIS — R911 Solitary pulmonary nodule: Secondary | ICD-10-CM | POA: Diagnosis present

## 2024-09-23 DIAGNOSIS — Z87891 Personal history of nicotine dependence: Secondary | ICD-10-CM | POA: Insufficient documentation

## 2024-10-01 ENCOUNTER — Telehealth: Payer: Self-pay

## 2024-10-01 NOTE — Telephone Encounter (Signed)
 FYI home health nurse called for verbal order for placing  order for Urinalysis w/ culture due to urinary symptoms, will send in results once received.

## 2024-10-02 ENCOUNTER — Telehealth: Payer: Self-pay | Admitting: Acute Care

## 2024-10-02 NOTE — Telephone Encounter (Signed)
 LVM to review recent Lung CT results.

## 2024-10-02 NOTE — Telephone Encounter (Signed)
 Please call the patient with the results of her low-dose screening CT.  Her scan was read as a lung RADS 2.  She has several waxing and waning lung nodules. There is a 11 mm right upper lobe ground glass area that is increased in size by 1 mm over the last 3 months.  Please ask patient if she has been sick or was sick at the time of the scan, and let me know.  I think for safety will do a 78-month follow-up scan to reevaluate that ground glass opacity.  Please fax results to PCP and let them know plan of care. Please schedule follow-up CT 12/30/2024. Thank so much

## 2024-10-03 ENCOUNTER — Other Ambulatory Visit: Payer: Self-pay

## 2024-10-03 DIAGNOSIS — Z87891 Personal history of nicotine dependence: Secondary | ICD-10-CM

## 2024-10-03 DIAGNOSIS — R911 Solitary pulmonary nodule: Secondary | ICD-10-CM

## 2024-10-03 DIAGNOSIS — Z122 Encounter for screening for malignant neoplasm of respiratory organs: Secondary | ICD-10-CM

## 2024-10-03 DIAGNOSIS — F17210 Nicotine dependence, cigarettes, uncomplicated: Secondary | ICD-10-CM
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
## 2024-10-23 ENCOUNTER — Encounter: Admitting: Family Medicine

## 2024-10-23 NOTE — Telephone Encounter (Unsigned)
 Copied from CRM [id number]. Topic: Referral - Question >> Oct 23, 2024 10:41 AM Deleta RAMAN wrote: Reason for CRM: Norvin from [facility] clinic neurology is calling stating patient need referral sent to [employer] for approval. She would like to know if this information can be uploaded so the patient can be seen [phone]

## 2024-10-25 ENCOUNTER — Other Ambulatory Visit (INDEPENDENT_AMBULATORY_CARE_PROVIDER_SITE_OTHER): Payer: Self-pay | Admitting: Vascular Surgery

## 2024-10-25 DIAGNOSIS — I70213 Atherosclerosis of native arteries of extremities with intermittent claudication, bilateral legs: Secondary | ICD-10-CM

## 2024-10-28 ENCOUNTER — Encounter (INDEPENDENT_AMBULATORY_CARE_PROVIDER_SITE_OTHER): Payer: 59

## 2024-10-28 ENCOUNTER — Ambulatory Visit (INDEPENDENT_AMBULATORY_CARE_PROVIDER_SITE_OTHER): Payer: 59 | Admitting: Vascular Surgery

## 2024-11-25 ENCOUNTER — Ambulatory Visit (INDEPENDENT_AMBULATORY_CARE_PROVIDER_SITE_OTHER): Admitting: Nurse Practitioner

## 2024-11-25 ENCOUNTER — Encounter (INDEPENDENT_AMBULATORY_CARE_PROVIDER_SITE_OTHER)

## 2025-05-15 ENCOUNTER — Ambulatory Visit
